# Patient Record
Sex: Female | Born: 1950
Health system: Southern US, Community
[De-identification: ages and names within clinical notes are randomized; demographics above are authoritative.]

## PROBLEM LIST (undated history)

## (undated) DIAGNOSIS — R112 Nausea with vomiting, unspecified: Secondary | ICD-10-CM

## (undated) DIAGNOSIS — K219 Gastro-esophageal reflux disease without esophagitis: Secondary | ICD-10-CM

## (undated) DIAGNOSIS — F32A Depression, unspecified: Secondary | ICD-10-CM

## (undated) DIAGNOSIS — M1990 Unspecified osteoarthritis, unspecified site: Secondary | ICD-10-CM

## (undated) DIAGNOSIS — F329 Major depressive disorder, single episode, unspecified: Secondary | ICD-10-CM

## (undated) DIAGNOSIS — Z9889 Other specified postprocedural states: Secondary | ICD-10-CM

## (undated) DIAGNOSIS — F419 Anxiety disorder, unspecified: Secondary | ICD-10-CM

## (undated) HISTORY — DX: Depression, unspecified: F32.A

## (undated) HISTORY — DX: Major depressive disorder, single episode, unspecified: F32.9

## (undated) HISTORY — DX: Gastro-esophageal reflux disease without esophagitis: K21.9

## (undated) HISTORY — PX: BUNIONECTOMY: SHX129

## (undated) HISTORY — DX: Anxiety disorder, unspecified: F41.9

---

## 2003-06-12 ENCOUNTER — Other Ambulatory Visit: Admission: RE | Admit: 2003-06-12 | Discharge: 2003-06-12 | Payer: Self-pay | Admitting: Obstetrics and Gynecology

## 2004-07-06 ENCOUNTER — Other Ambulatory Visit: Admission: RE | Admit: 2004-07-06 | Discharge: 2004-07-06 | Payer: Self-pay | Admitting: Obstetrics and Gynecology

## 2004-09-15 ENCOUNTER — Encounter: Payer: Self-pay | Admitting: Internal Medicine

## 2004-10-03 ENCOUNTER — Encounter: Payer: Self-pay | Admitting: Internal Medicine

## 2004-11-15 ENCOUNTER — Ambulatory Visit: Payer: Self-pay | Admitting: Internal Medicine

## 2005-01-02 ENCOUNTER — Ambulatory Visit: Payer: Self-pay | Admitting: Internal Medicine

## 2005-02-03 ENCOUNTER — Ambulatory Visit: Payer: Self-pay | Admitting: Hospitalist

## 2005-03-21 ENCOUNTER — Ambulatory Visit: Payer: Self-pay | Admitting: Internal Medicine

## 2005-04-04 ENCOUNTER — Ambulatory Visit: Payer: Self-pay | Admitting: Hospitalist

## 2005-05-02 ENCOUNTER — Ambulatory Visit: Payer: Self-pay | Admitting: Hospitalist

## 2005-07-03 ENCOUNTER — Ambulatory Visit: Payer: Self-pay | Admitting: Internal Medicine

## 2005-08-18 ENCOUNTER — Ambulatory Visit: Payer: Self-pay | Admitting: Hospitalist

## 2005-09-12 ENCOUNTER — Emergency Department (HOSPITAL_COMMUNITY): Admission: EM | Admit: 2005-09-12 | Discharge: 2005-09-12 | Payer: Self-pay | Admitting: Emergency Medicine

## 2005-09-14 ENCOUNTER — Ambulatory Visit: Payer: Self-pay | Admitting: Internal Medicine

## 2005-10-06 ENCOUNTER — Ambulatory Visit: Payer: Self-pay | Admitting: Internal Medicine

## 2006-02-09 DIAGNOSIS — F322 Major depressive disorder, single episode, severe without psychotic features: Secondary | ICD-10-CM

## 2006-02-09 DIAGNOSIS — F411 Generalized anxiety disorder: Secondary | ICD-10-CM | POA: Insufficient documentation

## 2006-02-12 ENCOUNTER — Ambulatory Visit: Payer: Self-pay | Admitting: Internal Medicine

## 2006-02-12 DIAGNOSIS — J309 Allergic rhinitis, unspecified: Secondary | ICD-10-CM | POA: Insufficient documentation

## 2006-02-12 DIAGNOSIS — D239 Other benign neoplasm of skin, unspecified: Secondary | ICD-10-CM | POA: Insufficient documentation

## 2006-03-09 ENCOUNTER — Ambulatory Visit: Payer: Self-pay | Admitting: Hospitalist

## 2006-03-09 LAB — CONVERTED CEMR LAB: Inflenza A Ag: POSITIVE — AB

## 2006-03-28 ENCOUNTER — Encounter (INDEPENDENT_AMBULATORY_CARE_PROVIDER_SITE_OTHER): Payer: Self-pay | Admitting: Internal Medicine

## 2006-03-28 ENCOUNTER — Ambulatory Visit: Payer: Self-pay | Admitting: Internal Medicine

## 2006-03-28 DIAGNOSIS — R51 Headache: Secondary | ICD-10-CM

## 2006-03-28 DIAGNOSIS — G4700 Insomnia, unspecified: Secondary | ICD-10-CM

## 2006-04-11 ENCOUNTER — Encounter (INDEPENDENT_AMBULATORY_CARE_PROVIDER_SITE_OTHER): Payer: Self-pay | Admitting: Internal Medicine

## 2006-04-11 ENCOUNTER — Ambulatory Visit: Payer: Self-pay | Admitting: Internal Medicine

## 2006-04-12 LAB — CONVERTED CEMR LAB
AST: 22 units/L (ref 0–37)
Albumin: 5 g/dL (ref 3.5–5.2)
BUN: 15 mg/dL (ref 6–23)
Calcium: 11 mg/dL — ABNORMAL HIGH (ref 8.4–10.5)
Chloride: 99 meq/L (ref 96–112)
HDL: 55 mg/dL (ref >39)
Potassium: 4.5 meq/L (ref 3.5–5.3)
Sodium: 136 meq/L (ref 135–145)
Total Protein: 8.2 g/dL (ref 6.0–8.3)

## 2006-04-30 ENCOUNTER — Ambulatory Visit (HOSPITAL_COMMUNITY): Admission: RE | Admit: 2006-04-30 | Discharge: 2006-04-30 | Payer: Self-pay | Admitting: Obstetrics and Gynecology

## 2006-04-30 ENCOUNTER — Encounter: Payer: Self-pay | Admitting: Internal Medicine

## 2006-05-01 ENCOUNTER — Ambulatory Visit: Payer: Self-pay | Admitting: Internal Medicine

## 2006-05-03 ENCOUNTER — Ambulatory Visit (HOSPITAL_COMMUNITY): Admission: RE | Admit: 2006-05-03 | Discharge: 2006-05-03 | Payer: Self-pay | Admitting: Obstetrics and Gynecology

## 2006-05-04 ENCOUNTER — Telehealth: Payer: Self-pay | Admitting: *Deleted

## 2006-05-14 ENCOUNTER — Ambulatory Visit: Payer: Self-pay | Admitting: Internal Medicine

## 2006-05-17 ENCOUNTER — Telehealth (INDEPENDENT_AMBULATORY_CARE_PROVIDER_SITE_OTHER): Payer: Self-pay | Admitting: *Deleted

## 2006-05-24 ENCOUNTER — Encounter: Payer: Self-pay | Admitting: Internal Medicine

## 2006-05-24 ENCOUNTER — Ambulatory Visit: Payer: Self-pay | Admitting: Internal Medicine

## 2006-06-11 ENCOUNTER — Telehealth (INDEPENDENT_AMBULATORY_CARE_PROVIDER_SITE_OTHER): Payer: Self-pay | Admitting: *Deleted

## 2006-06-21 ENCOUNTER — Telehealth (INDEPENDENT_AMBULATORY_CARE_PROVIDER_SITE_OTHER): Payer: Self-pay | Admitting: Internal Medicine

## 2006-06-22 ENCOUNTER — Encounter: Payer: Self-pay | Admitting: Internal Medicine

## 2006-06-22 ENCOUNTER — Ambulatory Visit: Payer: Self-pay | Admitting: Internal Medicine

## 2006-07-24 ENCOUNTER — Ambulatory Visit: Payer: Self-pay | Admitting: Internal Medicine

## 2006-08-15 ENCOUNTER — Telehealth (INDEPENDENT_AMBULATORY_CARE_PROVIDER_SITE_OTHER): Payer: Self-pay | Admitting: Pharmacy Technician

## 2006-08-16 ENCOUNTER — Telehealth: Payer: Self-pay | Admitting: *Deleted

## 2006-08-22 ENCOUNTER — Ambulatory Visit: Payer: Self-pay | Admitting: Internal Medicine

## 2006-08-22 DIAGNOSIS — N951 Menopausal and female climacteric states: Secondary | ICD-10-CM

## 2006-09-05 ENCOUNTER — Telehealth: Payer: Self-pay | Admitting: Infectious Disease

## 2006-09-12 ENCOUNTER — Emergency Department (HOSPITAL_COMMUNITY): Admission: EM | Admit: 2006-09-12 | Discharge: 2006-09-13 | Payer: Self-pay | Admitting: Emergency Medicine

## 2006-10-04 ENCOUNTER — Ambulatory Visit: Payer: Self-pay | Admitting: Internal Medicine

## 2006-10-09 ENCOUNTER — Telehealth (INDEPENDENT_AMBULATORY_CARE_PROVIDER_SITE_OTHER): Payer: Self-pay | Admitting: *Deleted

## 2006-11-07 ENCOUNTER — Ambulatory Visit: Payer: Self-pay | Admitting: Internal Medicine

## 2006-11-07 ENCOUNTER — Ambulatory Visit (HOSPITAL_COMMUNITY): Admission: RE | Admit: 2006-11-07 | Discharge: 2006-11-07 | Payer: Self-pay | Admitting: Internal Medicine

## 2006-11-07 DIAGNOSIS — M25569 Pain in unspecified knee: Secondary | ICD-10-CM | POA: Insufficient documentation

## 2006-11-13 ENCOUNTER — Telehealth: Payer: Self-pay | Admitting: *Deleted

## 2006-11-15 ENCOUNTER — Telehealth: Payer: Self-pay | Admitting: Internal Medicine

## 2006-12-17 ENCOUNTER — Ambulatory Visit: Payer: Self-pay | Admitting: Hospitalist

## 2007-01-15 ENCOUNTER — Telehealth: Payer: Self-pay | Admitting: Internal Medicine

## 2007-02-26 ENCOUNTER — Telehealth: Payer: Self-pay | Admitting: *Deleted

## 2007-03-19 ENCOUNTER — Telehealth: Payer: Self-pay | Admitting: Internal Medicine

## 2007-06-10 ENCOUNTER — Ambulatory Visit: Payer: Self-pay | Admitting: *Deleted

## 2007-06-24 ENCOUNTER — Telehealth: Payer: Self-pay | Admitting: Internal Medicine

## 2007-06-25 ENCOUNTER — Telehealth: Payer: Self-pay | Admitting: Internal Medicine

## 2007-07-09 ENCOUNTER — Telehealth: Payer: Self-pay | Admitting: *Deleted

## 2007-08-20 ENCOUNTER — Telehealth: Payer: Self-pay | Admitting: Internal Medicine

## 2007-10-23 ENCOUNTER — Ambulatory Visit: Payer: Self-pay | Admitting: Internal Medicine

## 2007-10-23 ENCOUNTER — Encounter: Payer: Self-pay | Admitting: Internal Medicine

## 2007-10-23 DIAGNOSIS — I10 Essential (primary) hypertension: Secondary | ICD-10-CM | POA: Insufficient documentation

## 2007-11-07 ENCOUNTER — Telehealth: Payer: Self-pay | Admitting: *Deleted

## 2008-01-09 ENCOUNTER — Ambulatory Visit: Payer: Self-pay | Admitting: Internal Medicine

## 2008-02-14 ENCOUNTER — Telehealth: Payer: Self-pay | Admitting: Internal Medicine

## 2008-02-20 ENCOUNTER — Ambulatory Visit: Payer: Self-pay | Admitting: Internal Medicine

## 2008-02-20 ENCOUNTER — Encounter: Payer: Self-pay | Admitting: Internal Medicine

## 2008-02-20 DIAGNOSIS — E785 Hyperlipidemia, unspecified: Secondary | ICD-10-CM | POA: Insufficient documentation

## 2008-02-20 DIAGNOSIS — R413 Other amnesia: Secondary | ICD-10-CM

## 2008-02-25 LAB — CONVERTED CEMR LAB
BUN: 20 mg/dL (ref 6–23)
CO2: 23 meq/L (ref 19–32)
Calcium: 9.2 mg/dL (ref 8.4–10.5)
Chloride: 102 meq/L (ref 96–112)
Cholesterol: 226 mg/dL — ABNORMAL HIGH (ref 0–200)
Creatinine, Ser: 0.63 mg/dL (ref 0.40–1.20)
Eosinophils Relative: 2 % (ref 0–5)
Glucose, Bld: 114 mg/dL — ABNORMAL HIGH (ref 70–99)
HCT: 40.2 % (ref 36.0–46.0)
Hemoglobin: 12.1 g/dL (ref 12.0–15.0)
Lymphocytes Relative: 48 % — ABNORMAL HIGH (ref 12–46)
Lymphs Abs: 3.1 10*3/uL (ref 0.7–4.0)
Monocytes Absolute: 0.5 10*3/uL (ref 0.1–1.0)
Monocytes Relative: 8 % (ref 3–12)
Neutro Abs: 2.6 10*3/uL (ref 1.7–7.7)
RBC: 4.24 M/uL (ref 3.87–5.11)
RDW: 13.2 % (ref 11.5–15.5)
Total Bilirubin: 0.2 mg/dL — ABNORMAL LOW (ref 0.3–1.2)
Total CHOL/HDL Ratio: 5.4
Triglycerides: 607 mg/dL — ABNORMAL HIGH (ref <150)
Vitamin B-12: 588 pg/mL (ref 211–911)
WBC: 6.4 10*3/uL (ref 4.0–10.5)

## 2008-03-17 ENCOUNTER — Telehealth: Payer: Self-pay | Admitting: *Deleted

## 2008-05-22 ENCOUNTER — Telehealth: Payer: Self-pay | Admitting: Infectious Disease

## 2008-06-01 ENCOUNTER — Encounter (INDEPENDENT_AMBULATORY_CARE_PROVIDER_SITE_OTHER): Payer: Self-pay | Admitting: *Deleted

## 2008-06-01 ENCOUNTER — Ambulatory Visit: Payer: Self-pay | Admitting: *Deleted

## 2008-06-01 DIAGNOSIS — N390 Urinary tract infection, site not specified: Secondary | ICD-10-CM | POA: Insufficient documentation

## 2008-06-02 ENCOUNTER — Telehealth: Payer: Self-pay | Admitting: *Deleted

## 2008-06-05 DIAGNOSIS — R748 Abnormal levels of other serum enzymes: Secondary | ICD-10-CM | POA: Insufficient documentation

## 2008-06-05 LAB — CONVERTED CEMR LAB
ALT: 42 units/L — ABNORMAL HIGH (ref 0–35)
CO2: 24 meq/L (ref 19–32)
Calcium: 9.9 mg/dL (ref 8.4–10.5)
Chloride: 103 meq/L (ref 96–112)
Creatinine, Ser: 0.75 mg/dL (ref 0.40–1.20)
GFR calc Af Amer: >60 mL/min (ref >60)
GFR calc non Af Amer: >60 mL/min (ref >60)
Glucose, Bld: 106 mg/dL — ABNORMAL HIGH (ref 70–99)
HDL: 49 mg/dL (ref >39)
LDL Cholesterol: 125 mg/dL — ABNORMAL HIGH (ref 0–99)
Nitrite: POSITIVE — AB
Specific Gravity, Urine: 1.031 — ABNORMAL HIGH (ref 1.005–1.030)
Total Bilirubin: 0.4 mg/dL (ref 0.3–1.2)
Total Protein: 7.6 g/dL (ref 6.0–8.3)
Urine Glucose: NEGATIVE mg/dL
pH: 6 (ref 5.0–8.0)

## 2008-06-14 ENCOUNTER — Telehealth (INDEPENDENT_AMBULATORY_CARE_PROVIDER_SITE_OTHER): Payer: Self-pay | Admitting: Internal Medicine

## 2008-07-02 ENCOUNTER — Telehealth: Payer: Self-pay | Admitting: Internal Medicine

## 2008-07-08 ENCOUNTER — Ambulatory Visit: Payer: Self-pay | Admitting: Internal Medicine

## 2008-09-17 ENCOUNTER — Ambulatory Visit: Payer: Self-pay | Admitting: Internal Medicine

## 2008-09-25 ENCOUNTER — Telehealth (INDEPENDENT_AMBULATORY_CARE_PROVIDER_SITE_OTHER): Payer: Self-pay | Admitting: *Deleted

## 2008-11-03 ENCOUNTER — Ambulatory Visit: Payer: Self-pay | Admitting: Internal Medicine

## 2009-02-04 ENCOUNTER — Ambulatory Visit: Payer: Self-pay | Admitting: Internal Medicine

## 2009-02-08 ENCOUNTER — Telehealth: Payer: Self-pay | Admitting: Internal Medicine

## 2009-03-18 ENCOUNTER — Ambulatory Visit: Payer: Self-pay | Admitting: Internal Medicine

## 2009-03-18 LAB — CONVERTED CEMR LAB
ALT: 30 units/L (ref 0–35)
AST: 25 units/L (ref 0–37)
Albumin: 4.7 g/dL (ref 3.5–5.2)
Alkaline Phosphatase: 115 units/L (ref 39–117)
BUN: 16 mg/dL (ref 6–23)
Basophils Absolute: 0.1 10*3/uL (ref 0.0–0.1)
Eosinophils Relative: 2 % (ref 0–5)
HDL: 44 mg/dL (ref >39)
LDL Cholesterol: 89 mg/dL (ref 0–99)
Lymphocytes Relative: 37 % (ref 12–46)
Lymphs Abs: 3 10*3/uL (ref 0.7–4.0)
Neutro Abs: 4.1 10*3/uL (ref 1.7–7.7)
Neutrophils Relative %: 51 % (ref 43–77)
Platelets: 352 10*3/uL (ref 150–400)
Potassium: 4.3 meq/L (ref 3.5–5.3)
RDW: 14.7 % (ref 11.5–15.5)
Sodium: 139 meq/L (ref 135–145)
TSH: 2.311 microintl units/mL (ref 0.350–4.5)
Vit D, 25-Hydroxy: 34 ng/mL (ref 30–89)
WBC: 8 10*3/uL (ref 4.0–10.5)

## 2009-06-01 ENCOUNTER — Telehealth: Payer: Self-pay | Admitting: Internal Medicine

## 2009-06-02 ENCOUNTER — Ambulatory Visit: Payer: Self-pay | Admitting: Internal Medicine

## 2009-06-02 ENCOUNTER — Ambulatory Visit (HOSPITAL_COMMUNITY)
Admission: RE | Admit: 2009-06-02 | Discharge: 2009-06-02 | Payer: Self-pay | Source: Home / Self Care | Admitting: Internal Medicine

## 2009-06-02 DIAGNOSIS — M25559 Pain in unspecified hip: Secondary | ICD-10-CM | POA: Insufficient documentation

## 2009-06-17 ENCOUNTER — Ambulatory Visit: Payer: Self-pay | Admitting: Internal Medicine

## 2009-06-17 DIAGNOSIS — M545 Low back pain, unspecified: Secondary | ICD-10-CM | POA: Insufficient documentation

## 2009-07-12 ENCOUNTER — Telehealth: Payer: Self-pay | Admitting: Internal Medicine

## 2009-08-19 ENCOUNTER — Ambulatory Visit: Payer: Self-pay | Admitting: Internal Medicine

## 2009-09-06 ENCOUNTER — Telehealth: Payer: Self-pay | Admitting: Internal Medicine

## 2009-09-10 ENCOUNTER — Telehealth: Payer: Self-pay | Admitting: Internal Medicine

## 2009-09-23 ENCOUNTER — Telehealth: Payer: Self-pay | Admitting: *Deleted

## 2009-10-05 ENCOUNTER — Ambulatory Visit: Payer: Self-pay | Admitting: Internal Medicine

## 2009-10-05 LAB — CONVERTED CEMR LAB
Glucose, Urine, Semiquant: NEGATIVE
Nitrite: NEGATIVE
Specific Gravity, Urine: 1.01
Urobilinogen, UA: 0.2
pH: 5.5

## 2009-10-21 ENCOUNTER — Ambulatory Visit: Payer: Self-pay | Admitting: Internal Medicine

## 2009-11-11 ENCOUNTER — Telehealth: Payer: Self-pay | Admitting: Licensed Clinical Social Worker

## 2009-12-01 ENCOUNTER — Telehealth: Payer: Self-pay | Admitting: Internal Medicine

## 2009-12-03 ENCOUNTER — Ambulatory Visit: Payer: Self-pay | Admitting: Internal Medicine

## 2009-12-09 ENCOUNTER — Ambulatory Visit: Payer: Self-pay | Admitting: Internal Medicine

## 2009-12-09 ENCOUNTER — Encounter: Payer: Self-pay | Admitting: Internal Medicine

## 2009-12-09 ENCOUNTER — Ambulatory Visit (HOSPITAL_COMMUNITY): Admission: RE | Admit: 2009-12-09 | Discharge: 2009-12-09 | Payer: Self-pay | Admitting: Ophthalmology

## 2009-12-09 LAB — CONVERTED CEMR LAB: Sed Rate: 10 mm/hr (ref 0–22)

## 2009-12-13 ENCOUNTER — Telehealth: Payer: Self-pay | Admitting: Internal Medicine

## 2009-12-14 ENCOUNTER — Ambulatory Visit: Payer: Self-pay | Admitting: Internal Medicine

## 2009-12-27 ENCOUNTER — Telehealth: Payer: Self-pay | Admitting: Internal Medicine

## 2009-12-28 ENCOUNTER — Ambulatory Visit: Payer: Self-pay | Admitting: Internal Medicine

## 2009-12-30 ENCOUNTER — Ambulatory Visit: Payer: Self-pay

## 2010-01-19 ENCOUNTER — Telehealth: Payer: Self-pay | Admitting: Internal Medicine

## 2010-02-13 ENCOUNTER — Encounter: Payer: Self-pay | Admitting: Obstetrics and Gynecology

## 2010-02-13 ENCOUNTER — Encounter: Payer: Self-pay | Admitting: Internal Medicine

## 2010-02-22 NOTE — Progress Notes (Addendum)
Summary: phone/gg  Phone Note Call from Patient   Caller: Patient Summary of Call: Pt called and needs a PA to get  [facility] XL 150 MG  She does not have any pills left. She does not take lexapro anymore.  Willl she be okay not to take these meds for a few days? Initial call taken by: Merrie Roof RN,  September 06, 2009 1:07 PM  Follow-up for Phone Call        its ok to go off the wellbutrin without a taper- i would tell her to continue to take teh Lexapro for now until she can discuss this with her psychiatrist. but all in all there is probably not really going to be a serious problem just stopping any of them. Follow-up by: Julaine Fusi  DO,  September 06, 2009 10:15 PM  Additional Follow-up for Phone Call Additional follow up Details #1::        Pt informed and Patient/caller verbalizes understanding of these instructions.  Additional Follow-up by: Merrie Roof RN,  September 07, 2009 12:07 PM

## 2010-02-22 NOTE — Assessment & Plan Note (Addendum)
Summary: EST-CK/FU/MEDS/CFB   Vital Signs:  Patient profile:   60 year old female Menstrual status:  postmenopausal Height:      60.5 inches (153.67 cm) Weight:      178.02 pounds (80.92 kg) BMI:     34.32 Temp:     97.4 degrees F (36.33 degrees C) oral Pulse rate:   88 / minute BP sitting:   115 / 82  (right arm)  Vitals Entered By: Angelina Ok RN (August 19, 2009 10:29 AM) CC: Depression Is Patient Diabetic? No Pain Assessment Patient in pain? yes     Location: back Intensity: 3 Type: aching Nutritional Status BMI of > 30 = obese  Have you ever been in a relationship where you felt threatened, hurt or afraid?No   Does patient need assistance? Functional Status Self care Ambulation Normal Comments Check up.  Needs refills   Primary Care Provider:  Julaine Fusi  DO  CC:  Depression.  History of Present Illness: [name] comes in today complaining of worseing depression and weight gain. Reduced activities-feels liek she doesnt want to get out of bed. Reports severe lonliness and low self confidence.  Depression History:      The patient is having a depressed mood most of the day and has a diminished interest in her usual daily activities.        The patient denies that she feels like life is not worth living, denies that she wishes that she were dead, and denies that she has thought about ending her life.        Comments:  Couple of weeks did not get out of bed.  .   Preventive Screening-Counseling & Management  Alcohol-Tobacco     Alcohol drinks/day: 1-2 per yr     Alcohol type: WINE      Smoking Status: never  Current Medications (verified): 1)  Alprazolam 1 Mg Tabs (Alprazolam) .... Take 1 Tablet By Mouth Three Times A Day 2)  Vitamin D 60109 Unit Caps (Ergocalciferol) .... Take 1 Tablet By Mouth Once A Week (Monday) 3)  Lexapro 20 Mg Tabs (Escitalopram Oxalate) .... Take 1 Tablet By Mouth Once A Day 4)  Nexium 20 Mg Cpdr (Esomeprazole Magnesium) .... Take 1  Tablet By Mouth Once A Day 5)  Vicodin 5-500 Mg Tabs (Hydrocodone-Acetaminophen) .... Take 1 Tablet By Mouth Every 6 Hour For Pain As Needed. 6)  Celebrex 200 Mg Caps (Celecoxib) .... Take 1 Tablet By Mouth Two Times A Day 7)  Wellbutrin Xl 150 Mg Xr24h-Tab (Bupropion Hcl) .... Take 1 Tablet By Mouth Once A Day  Allergies (verified): 1)  ! Pcn 2)  Codeine  Physical Exam  General:  alert, well-developed, and well-nourished.   Lungs:  normal respiratory effort, no intercostal retractions, no accessory muscle use, and normal breath sounds.   Heart:  normal rate and regular rhythm.   Abdomen:  soft, non-tender, normal bowel sounds, and no distention.   Msk:  normal ROM, no joint tenderness, no joint swelling, no joint warmth, and no redness over joints.   Impression & Recommendations:  Problem # 1:  BACK PAIN, LUMBAR (ICD-724.2) Encouraged her to use Celebrex despite TV ads. Ok with occasional vicodin use but not daily. Avoid long term opiates in this situation. May need to get MRI if pain continues. XR neg.  Her updated medication list for this problem includes:    Vicodin 5-500 Mg Tabs (Hydrocodone-acetaminophen) .Marland Kitchen... Take 1 tablet by mouth every 6 hour for pain as needed.  Celebrex 200 Mg Caps (Celecoxib) .Marland Kitchen... Take 1 tablet by mouth two times a day  Problem # 2:  DEPRESSION (ICD-311) At baseline. Situational. Mood disorder, personality disorder complicate existing depression. She would do well with counseling , but refuses- giving the reason that he knows more than teh counselor and reporst that she has tried it before it doesnt help.    Her updated medication list for this problem includes:    Alprazolam 1 Mg Tabs (Alprazolam) .Marland Kitchen... Take 1 tablet by mouth three times a day    Lexapro 20 Mg Tabs (Escitalopram oxalate) .Marland Kitchen... Take 1 tablet by mouth once a day    Wellbutrin Xl 150 Mg Xr24h-tab (Bupropion hcl) .Marland Kitchen... Take 1 tablet by mouth once a day  Problem # 3:  HYPERTENSION,  BENIGN ESSENTIAL (ICD-401.1) Well controlled. No chenge today.  BP today: 115/82 Prior BP: 126/76 (06/17/2009)  Labs Reviewed: K+: 4.3 (03/18/2009) Creat: : 0.85 (03/18/2009)   Chol: 207 (03/18/2009)   HDL: 44 (03/18/2009)   LDL: 89 (03/18/2009)   TG: 371 (03/18/2009)  Complete Medication List: 1)  Alprazolam 1 Mg Tabs (Alprazolam) .... Take 1 tablet by mouth three times a day 2)  Vitamin D 42595 Unit Caps (Ergocalciferol) .... Take 1 tablet by mouth once a week (monday) 3)  Lexapro 20 Mg Tabs (Escitalopram oxalate) .... Take 1 tablet by mouth once a day 4)  Nexium 20 Mg Cpdr (Esomeprazole magnesium) .... Take 1 tablet by mouth once a day 5)  Vicodin 5-500 Mg Tabs (Hydrocodone-acetaminophen) .... Take 1 tablet by mouth every 6 hour for pain as needed. 6)  Celebrex 200 Mg Caps (Celecoxib) .... Take 1 tablet by mouth two times a day 7)  Wellbutrin Xl 150 Mg Xr24h-tab (Bupropion hcl) .... Take 1 tablet by mouth once a day  Patient Instructions: 1)  Please schedule a follow-up appointment in 2 months. Prescriptions: VICODIN 5-500 MG TABS (HYDROCODONE-ACETAMINOPHEN) Take 1 tablet by mouth every 6 hour for pain as needed.  #60 x 0   Entered and Authorized by:   Julaine Fusi  DO   Signed by:   Julaine Fusi  DO on 08/19/2009   Method used:   Print then Give to Patient   RxID:   [id number] WELLBUTRIN XL 150 MG XR24H-TAB (BUPROPION HCL) Take 1 tablet by mouth once a day  #30 x 3   Entered and Authorized by:   Julaine Fusi  DO   Signed by:   Julaine Fusi  DO on 08/19/2009   Method used:   Electronically to        [facility]  [phone]* (retail)       [address]       [place], [place]  [address]       Ph: [phone] or [phone]       Fax: [phone]   RxID:   [id number]   Prevention & Chronic Care Immunizations   Influenza vaccine: Fluvax MCR  (11/03/2008)    Tetanus booster: Not documented    Pneumococcal vaccine: Not  documented    H. zoster vaccine: Not documented  Colorectal Screening   Hemoccult: Not documented    Colonoscopy: Results: Normal. Results: Diverticulosis.       Location:  [facility].  Location:  [facility] .    (12/15/2006)  Other Screening   Pap smear: Not documented    Mammogram: No specific mammographic evidence of malignancy.    (04/30/2006)  Mammogram action/deferral: Ordered  (03/18/2009)    DXA bone density scan: Not documented   Smoking status: never  (08/19/2009)  Lipids   Total Cholesterol: 207  (03/18/2009)   LDL: 89  (03/18/2009)   LDL Direct: Not documented   HDL: 44  (03/18/2009)   Triglycerides: 371  (03/18/2009)    SGOT (AST): 25  (03/18/2009)   SGPT (ALT): 30  (03/18/2009)   Alkaline phosphatase: 115  (03/18/2009)   Total bilirubin: 0.3  (03/18/2009)  Hypertension   Last Blood Pressure: 115 / 82  (08/19/2009)   Serum creatinine: 0.85  (03/18/2009)   Serum potassium 4.3  (03/18/2009)  Self-Management Support :   Personal Goals (by the next clinic visit) :      Personal blood pressure goal: 140/90  (06/02/2009)     Personal LDL goal: 100  (06/02/2009)    Patient will work on the following items until the next clinic visit to reach self-care goals:     Medications and monitoring: take my medicines every day, bring all of my medications to every visit  (08/19/2009)     Eating: drink diet soda or water instead of juice or soda, eat more vegetables, use fresh or frozen vegetables, eat foods that are low in salt, eat baked foods instead of fried foods, eat fruit for snacks and desserts, limit or avoid alcohol  (08/19/2009)     Activity: take a 30 minute walk every day  (08/19/2009)    Hypertension self-management support: Written self-care plan, Education handout, Pre-printed educational material, Resources for patients handout  (08/19/2009)   Hypertension self-care plan printed.   Hypertension education handout  printed    Lipid self-management support: Written self-care plan, Education handout, Pre-printed educational material, Resources for patients handout  (08/19/2009)   Lipid self-care plan printed.   Lipid education handout printed      Resource handout printed.     Vital Signs:  Patient profile:   60 year old female Menstrual status:  postmenopausal Height:      60.5 inches (153.67 cm) Weight:      178.02 pounds (80.92 kg) BMI:     34.32 Temp:     97.4 degrees F (36.33 degrees C) oral Pulse rate:   88 / minute BP sitting:   115 / 82  (right arm)  Vitals Entered By: Angelina Ok RN (August 19, 2009 10:29 AM)

## 2010-02-22 NOTE — Progress Notes (Addendum)
Summary: Dental Referral  Phone Note Call from Patient   Caller: Patient Call For: Julaine Fusi  DO Summary of Call: Call from pt wants a referral to the Dentist.  [place] Spaniel she had spoken to the doctor about.   Pt says that she just has not seen a Dentist in a while Angelina Ok RN  February 08, 2009 11:03 AM  Initial call taken by: Angelina Ok RN,  February 08, 2009 11:03 AM  Follow-up for Phone Call        I will put in referral Follow-up by: Julaine Fusi  DO,  February 08, 2009 3:13 PM  Additional Follow-up for Phone Call Additional follow up Details #1::        Since pt has no Dental problems  will nedd to go to the next [facility] wide [facility].  Pt was informed of.  Additional Follow-up by: Angelina Ok RN,  April 13, 2009 11:43 AM

## 2010-02-22 NOTE — Progress Notes (Addendum)
Summary: phone/gg  Phone Note Call from Patient   Caller: Patient Summary of Call: Pt called to say she can't even walk, she is in such pain.  She needs to know what else to do .  She is using heating pad without help.  She would like you to call her with the results of her xrays.  She does not know what to do. Pt seen 11/11 by Dr Cathey Endow for same pain. Pt # [id number] Initial call taken by: Merrie Roof RN,  December 13, 2009 11:28 AM  Follow-up for Phone Call        Pt can not come in today.   talked with Dr Cathey Endow and pt will be seen tomorrow for evaluation. xray results not in chart.  Under wrong MRN - will fix. Pt informed Follow-up by: Merrie Roof RN,  December 13, 2009 11:57 AM  Additional Follow-up for Phone Call Additional follow up Details #1::        I had sent flag to [facility] to fix the MRN issue. THanks Additional Follow-up by: Blanch Media MD,  December 13, 2009 1:43 PM

## 2010-02-22 NOTE — Assessment & Plan Note (Addendum)
Summary: F/U/VS   Vital Signs:  Patient profile:   60 year old female Height:      60.5 inches (153.67 cm) Weight:      183.4 pounds (83.36 kg) BMI:     35.36 Temp:     97.4 degrees F (36.33 degrees C) oral Pulse rate:   84 / minute BP sitting:   113 / 69  (right arm)  Vitals Entered By: Blenda Mounts (March 18, 2009 9:20 AM)[name], RN March 18, 2009 9:20 AM CC: check up, fell in [place] an still has left leg pain, Depression Is Patient Diabetic? No Pain Assessment Patient in pain? yes     Location: left leg Intensity: 5 or 6 Type: aching Onset of pain  Intermittent, worse at night Nutritional Status BMI of > 30 = obese  Have you ever been in a relationship where you felt threatened, hurt or afraid?No   Does patient need assistance? Functional Status Self care Ambulation Normal   Primary Care Provider:  Julaine Fusi  DO  CC:  check up, fell in jan an still has left leg pain, and Depression.  History of Present Illness: [name] comes in for routine follow-up. She continues to be fatigued and depressed most days. 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 She continues to struggle with her weight.  Depression History:      The patient is having a depressed mood most of the day and has a diminished interest in her usual daily activities.        The patient denies that she feels like life is not worth living, denies that she wishes that she were dead, and denies that she has thought about ending her life.         Preventive Screening-Counseling & Management  Alcohol-Tobacco     Alcohol drinks/day: 1-2 per yr     Alcohol type: WINE      Smoking Status: never  Caffeine-Diet-Exercise     Does Patient Exercise: yes     Type of exercise: WALK     Times/week: 3  Current Medications (verified): 1)  Alprazolam 1 Mg Tabs (Alprazolam) .... Take 1 Tablet By Mouth Three Times A Day 2)  Vitamin D 40981 Unit Caps (Ergocalciferol) ....  Take 1 Tablet By Mouth Once A Week (Monday) 3)  Lexapro 10 Mg Tabs (Escitalopram Oxalate) .... Take 1 Tablet By Mouth Once A Day 4)  Crestor 10 Mg Tabs (Rosuvastatin Calcium) .... Take 1 Tablet By Mouth Once A Day 5)  Nexium 20 Mg Cpdr (Esomeprazole Magnesium) .... Take 1 Tablet By Mouth Once A Day 6)  Topamax 100 Mg Tabs (Topiramate) .... Take 1 Tablet By Mouth Once A Day 7)  Glucophage Xr 500 Mg Xr24h-Tab (Metformin Hcl) .... Take 1 Tablet By Mouth Once A Day 8)  Tramadol Hcl 50 Mg Tabs (Tramadol Hcl) .... Take 1 Tablet By Mouth Every 6 Hours  Allergies (verified): 1)  ! Pcn 2)  Codeine  Physical Exam  General:  Middle-aged overweight woman in NAD. Marland Kitchenalert, well-developed, well-nourished, and well-hydrated.   Lungs:  normal respiratory effort, no intercostal retractions, no accessory muscle use, normal breath sounds, no crackles, and no wheezes.   Heart:  normal rate, regular rhythm, and no murmur.   Abdomen:  soft, non-tender, normal bowel sounds, no distention, no masses, no guarding, and no rigidity.   Msk:  no joint swelling and no redness over joints.   Extremities:  Some tenderness in left  thigh from a fall back in Jan. Psych:  Oriented X3, memory intact for recent and remote, normally interactive, good eye contact, and not anxious appearing.     Impression & Recommendations:  Problem # 1:  DEPRESSION (ICD-311) I have encouraged her to speak with psych in detail about her issues Her updated medication list for this problem includes:    Alprazolam 1 Mg Tabs (Alprazolam) .Marland Kitchen... Take 1 tablet by mouth three times a day    Lexapro 10 Mg Tabs (Escitalopram oxalate) .Marland Kitchen... Take 1 tablet by mouth once a day  Problem # 2:  ANXIETY (ICD-300.00) Continue current meds. Her updated medication list for this problem includes:    Alprazolam 1 Mg Tabs (Alprazolam) .Marland Kitchen... Take 1 tablet by mouth three times a day    Lexapro 10 Mg Tabs (Escitalopram oxalate) .Marland Kitchen... Take 1 tablet by mouth once a  day  Problem # 3:  ANXIETY (ICD-300.00)  Her updated medication list for this problem includes:    Alprazolam 1 Mg Tabs (Alprazolam) .Marland Kitchen... Take 1 tablet by mouth three times a day    Lexapro 10 Mg Tabs (Escitalopram oxalate) .Marland Kitchen... Take 1 tablet by mouth once a day  Problem # 4:  KNEE PAIN, BILATERAL (ICD-719.46)  Her updated medication list for this problem includes:    Tramadol Hcl 50 Mg Tabs (Tramadol hcl) .Marland Kitchen... Take 1 tablet by mouth every 6 hours  Complete Medication List: 1)  Alprazolam 1 Mg Tabs (Alprazolam) .... Take 1 tablet by mouth three times a day 2)  Vitamin D 66440 Unit Caps (Ergocalciferol) .... Take 1 tablet by mouth once a week (monday) 3)  Lexapro 10 Mg Tabs (Escitalopram oxalate) .... Take 1 tablet by mouth once a day 4)  Crestor 10 Mg Tabs (Rosuvastatin calcium) .... Take 1 tablet by mouth once a day 5)  Nexium 20 Mg Cpdr (Esomeprazole magnesium) .... Take 1 tablet by mouth once a day 6)  Topamax 100 Mg Tabs (Topiramate) .... Take 1 tablet by mouth once a day 7)  Glucophage Xr 500 Mg Xr24h-tab (Metformin hcl) .... Take 1 tablet by mouth once a day 8)  Tramadol Hcl 50 Mg Tabs (Tramadol hcl) .... Take 1 tablet by mouth every 6 hours  Other Orders: Mammogram (Screening) (Mammo) T-Comprehensive Metabolic Panel [id number]) T-Lipid Profile [id number]) T-CBC w/Diff [id number]) T-TSH [id number]) T-Vitamin D (25-Hydroxy) [id number])  Patient Instructions: 1)  Please schedule a follow-up appointment in 3 months. Prescriptions: LEXAPRO 10 MG TABS (ESCITALOPRAM OXALATE) Take 1 tablet by mouth once a day  #30 x 11   Entered and Authorized by:   Julaine Fusi  DO   Signed by:   Julaine Fusi  DO on 04/12/2009   Method used:   Print then Give to Patient   RxID:   [id number] TRAMADOL HCL 50 MG TABS (TRAMADOL HCL) Take 1 tablet by mouth every 6 hours  #60 x 0   Entered and Authorized by:   Julaine Fusi  DO   Signed by:   Julaine Fusi  DO on 03/18/2009    Method used:   Print then Give to Patient   RxID:   [id number]  Process Orders Check Orders Results:     [facility]: Check successful Tests Sent for requisitioning (April 12, 2009 12:01 PM):     03/18/2009: [facility] -- T-Comprehensive Metabolic Panel [[id number]] (signed)     03/18/2009: [facility] -- T-Lipid Profile [id number] (signed)     03/18/2009: [facility] -- [employer]  w/Diff [[id number]] (signed)     03/18/2009: [facility] -- T-TSH [phone] (signed)     03/18/2009: [facility] -- T-Vitamin D (25-Hydroxy) [phone] (signed)    Prevention & Chronic Care Immunizations   Influenza vaccine: Fluvax MCR  (11/03/2008)    Tetanus booster: Not documented    Pneumococcal vaccine: Not documented    H. zoster vaccine: Not documented  Colorectal Screening   Hemoccult: Not documented    Colonoscopy: Results: Normal. Results: Diverticulosis.       Location:  [facility].  Location:  [facility] .    (12/15/2006)  Other Screening   Pap smear: Not documented    Mammogram: Not documented   Mammogram action/deferral: Ordered  (03/18/2009)    DXA bone density scan: Not documented   Smoking status: never  (03/18/2009)  Lipids   Total Cholesterol: 211  (06/01/2008)   LDL: 125  (06/01/2008)   LDL Direct: Not documented   HDL: 49  (06/01/2008)   Triglycerides: 183  (06/01/2008)    SGOT (AST): 28  (06/01/2008)   SGPT (ALT): 42  (06/01/2008) CMP ordered    Alkaline phosphatase: 146  (06/01/2008)   Total bilirubin: 0.4  (06/01/2008)  Hypertension   Last Blood Pressure: 113 / 69  (03/18/2009)   Serum creatinine: 0.75  (06/01/2008)   Serum potassium 3.9  (06/01/2008) CMP ordered   Self-Management Support :    Hypertension self-management support: Not documented    Lipid self-management support: Not documented    Nursing  Instructions: Schedule screening mammogram (see order)    Process Orders Check Orders Results:     [facility]: Check successful Tests Sent for requisitioning (April 12, 2009 12:01 PM):     03/18/2009: [facility] -- T-Comprehensive Metabolic Panel [[id number]] (signed)     03/18/2009: [facility] -- T-Lipid Profile [id number] (signed)     03/18/2009: [facility] -- T-CBC w/Diff [[id number]] (signed)     03/18/2009: [facility] -- T-TSH [phone] (signed)     03/18/2009: [facility] -- T-Vitamin D (25-Hydroxy) [id number] (signed)

## 2010-02-22 NOTE — Assessment & Plan Note (Addendum)
Summary: ACUTE/GOLDING/UTI/CH   Vital Signs:  Patient profile:   60 year old female Menstrual status:  postmenopausal Height:      60.5 inches (153.67 cm) Weight:      178 pounds (80.91 kg) BMI:     34.31 Temp:     98.6 degrees F (37.00 degrees C) oral Pulse rate:   74 / minute BP sitting:   133 / 63  (right arm)  Vitals Entered By: Angelina Ok RN (October 05, 2009 3:42 PM)  Have you ever been in a relationship where you felt threatened, hurt or afraid?No  Comments dysurea   Primary Care Provider:  Julaine Fusi  DO   History of Present Illness: 60 y/o woman comes to the clinic complainig of dysurea. no fever, no abd pain, no vaginal discharge, no other complaints. has been going on for few days. took cefalosporin prescribed to her dog by a vet. she is unsure how many tablets she took.   Depression History:      The patient denies a depressed mood most of the day and a diminished interest in her usual daily activities.         Preventive Screening-Counseling & Management  Alcohol-Tobacco     Alcohol drinks/day: 1-2 per yr     Alcohol type: WINE      Smoking Status: never  Current Medications (verified): 1)  Alprazolam 1 Mg Tabs (Alprazolam) .... Take 1 Tablet By Mouth Three Times A Day 2)  Vitamin D 04540 Unit Caps (Ergocalciferol) .... Take 1 Tablet By Mouth Once A Week (Monday) 3)  Lexapro 20 Mg Tabs (Escitalopram Oxalate) .... Take 1 Tablet By Mouth Once A Day 4)  Nexium 20 Mg Cpdr (Esomeprazole Magnesium) .... Take 1 Tablet By Mouth Once A Day 5)  Vicodin 5-500 Mg Tabs (Hydrocodone-Acetaminophen) .... Take 1 Tablet By Mouth Every 6 Hour For Pain As Needed. 6)  Celebrex 200 Mg Caps (Celecoxib) .... Take 1 Tablet By Mouth Two Times A Day 7)  Wellbutrin Xl 150 Mg Xr24h-Tab (Bupropion Hcl) .... Take 1 Tablet By Mouth Once A Day  Allergies: 1)  ! Pcn 2)  ! Cephalexin 3)  Codeine  Review of Systems  The patient denies anorexia, fever, weight loss, weight gain,  vision loss, decreased hearing, hoarseness, chest pain, syncope, dyspnea on exertion, peripheral edema, prolonged cough, headaches, hemoptysis, abdominal pain, melena, hematochezia, severe indigestion/heartburn, hematuria, incontinence, genital sores, muscle weakness, suspicious skin lesions, transient blindness, difficulty walking, depression, unusual weight change, abnormal bleeding, enlarged lymph nodes, angioedema, breast masses, and testicular masses.    Physical Exam  General:  alert, well-developed, and well-nourished.   Head:  normocephalic and atraumatic.   Eyes:  vision grossly intact, pupils equal, and pupils round.   Ears:  R ear normal and L ear normal.   Nose:  no external deformity.   Mouth:  good dentition.   Neck:  supple and full ROM.   Lungs:  normal respiratory effort, no intercostal retractions, no accessory muscle use, normal breath sounds, and no dullness.   Heart:  normal rate, regular rhythm, no murmur, no gallop, and no rub.   Abdomen:  soft, non-tender, normal bowel sounds, no distention, and no masses.  no cva tenderness   Impression & Recommendations:  Problem # 1:  UTI (ICD-599.0) possible uncomplicated urinary infection. the dipstick shows mild leucocytosis. she took few tablets of the abx prescribed to her dog. will await urine Cx. will prescribe ABx if infection detected. wil advise her to  RTC in case symptoms persists.   Orders: T-Urinalysis Dipstick only ([id number]) T-Culture, Urine ([id number])  Complete Medication List: 1)  Alprazolam 1 Mg Tabs (Alprazolam) .... Take 1 tablet by mouth three times a day 2)  Vitamin D 47829 Unit Caps (Ergocalciferol) .... Take 1 tablet by mouth once a week (monday) 3)  Lexapro 20 Mg Tabs (Escitalopram oxalate) .... Take 1 tablet by mouth once a day 4)  Nexium 20 Mg Cpdr (Esomeprazole magnesium) .... Take 1 tablet by mouth once a day 5)  Vicodin 5-500 Mg Tabs (Hydrocodone-acetaminophen) .... Take 1 tablet by mouth every  6 hour for pain as needed. 6)  Celebrex 200 Mg Caps (Celecoxib) .... Take 1 tablet by mouth two times a day 7)  Wellbutrin Xl 150 Mg Xr24h-tab (Bupropion hcl) .... Take 1 tablet by mouth once a day  Other Orders: Influenza Vaccine MCR ([id number])  Patient Instructions: 1)  Will call you with results from the urine analysis, call us if you are running a fever, abominal pain or other complain   Vital Signs:  Patient profile:   60 year old female Menstrual status:  postmenopausal Height:      60.5 inches (153.67 cm) Weight:      178 pounds (80.91 kg) BMI:     34.31 Temp:     98.6 degrees F (37.00 degrees C) oral Pulse rate:   74 / minute BP sitting:   133 / 63  (right arm)  Vitals Entered By: Angelina Ok RN (October 05, 2009 3:42 PM)   Prevention & Chronic Care Immunizations   Influenza vaccine: Fluvax MCR  (10/05/2009)    Tetanus booster: Not documented    Pneumococcal vaccine: Not documented    H. zoster vaccine: Not documented  Colorectal Screening   Hemoccult: Not documented    Colonoscopy: Results: Normal. Results: Diverticulosis.       Location:  [facility].  Location:  [facility] .    (12/15/2006)  Other Screening   Pap smear: Not documented    Mammogram: No specific mammographic evidence of malignancy.    (04/30/2006)   Mammogram action/deferral: Ordered  (03/18/2009)    DXA bone density scan: Not documented   Smoking status: never  (10/05/2009)  Lipids   Total Cholesterol: 207  (03/18/2009)   LDL: 89  (03/18/2009)   LDL Direct: Not documented   HDL: 44  (03/18/2009)   Triglycerides: 371  (03/18/2009)    SGOT (AST): 25  (03/18/2009)   SGPT (ALT): 30  (03/18/2009)   Alkaline phosphatase: 115  (03/18/2009)   Total bilirubin: 0.3  (03/18/2009)  Hypertension   Last Blood Pressure: 133 / 63  (10/05/2009)   Serum creatinine: 0.85  (03/18/2009)   Serum potassium 4.3  (03/18/2009)  Self-Management Support :   Personal  Goals (by the next clinic visit) :      Personal blood pressure goal: 140/90  (06/02/2009)     Personal LDL goal: 100  (06/02/2009)    Patient will work on the following items until the next clinic visit to reach self-care goals:     Medications and monitoring: take my medicines every day, bring all of my medications to every visit  (10/05/2009)     Eating: drink diet soda or water instead of juice or soda, eat more vegetables, use fresh or frozen vegetables, eat foods that are low in salt, eat baked foods instead of fried foods, eat fruit for snacks and desserts, limit or avoid alcohol  (10/05/2009)  Activity: take a 30 minute walk every day  (10/05/2009)    Hypertension self-management support: Written self-care plan, Education handout, Pre-printed educational material  (10/05/2009)   Hypertension self-care plan printed.   Hypertension education handout printed    Lipid self-management support: Written self-care plan, Education handout, Pre-printed educational material  (10/05/2009)   Lipid self-care plan printed.   Lipid education handout printed    Laboratory Results   Urine Tests  Date/Time Recieved: 10/05/2009 16:08 GH Date/Time Reported: 10/05/2009 16:11 GH  Routine Urinalysis   Color: lt. yellow Appearance: Hazy Glucose: negative   (Normal Range: Negative) Bilirubin: negative   (Normal Range: Negative) Ketone: negative   (Normal Range: Negative) Spec. Gravity: 1.010   (Normal Range: 1.003-1.035) Blood: trace-lysed   (Normal Range: Negative) pH: 5.5   (Normal Range: 5.0-8.0) Protein: negative   (Normal Range: Negative) Urobilinogen: 0.2   (Normal Range: 0-1) Nitrite: negative   (Normal Range: Negative) Leukocyte Esterace: small   (Normal Range: Negative)    Comments: Sent for Urinalysis and Culture per Dr. Scot Dock.Angelina Ok RN  October 05, 2009 4:25 PM       Immunizations Administered:  Influenza Vaccine # 1:    Vaccine Type: Fluvax MCR    Site:  left deltoid    Mfr: [employer]    Dose: 0.5 ml    Route: IM    Given by: Angelina Ok RN    Exp. Date: 07/23/2010    Lot #: [id number]    VIS given: 08/17/09 version given October 05, 2009.  Flu Vaccine Consent Questions:    Do you have a history of severe allergic reactions to this vaccine? no    Any prior history of allergic reactions to egg and/or gelatin? no    Do you have a sensitivity to the preservative Thimersol? no    Do you have a past history of Guillan-Barre Syndrome? no    Do you currently have an acute febrile illness? no    Have you ever had a severe reaction to latex? no    Vaccine information given and explained to patient? yes    Are you currently pregnant? no   Process Orders Check Orders Results:     [facility]: Check successful Tests Sent for requisitioning (October 07, 2009 2:00 PM):     10/05/2009: [facility] -- T-Culture, Urine [[id number]] (signed)

## 2010-02-22 NOTE — Assessment & Plan Note (Addendum)
Summary: back pain/gg   Vital Signs:  Patient profile:   60 year old female Menstrual status:  postmenopausal Height:      60.5 inches (153.67 cm) Weight:      168.1 pounds (76.41 kg) BMI:     32.41 O2 Sat:      97 % on Room air Temp:     98.2 degrees F (36.78 degrees C) oral Pulse rate:   87 / minute BP sitting:   123 / 73  (left arm) Cuff size:   large  Vitals Entered By: Cynda Familia Duncan Dull) (December 14, 2009 2:24 PM)  O2 Flow:  Room air CC: f/u back pain, cant sleep,  Depression (recently found out son in law has cancer) Is Patient Diabetic? No Research Study Name: bp taken from 11/11 visit Pain Assessment Patient in pain? yes     Location: back pain Intensity: 8 Type: sharp Onset of pain  Constant Nutritional Status BMI of > 30 = obese  Have you ever been in a relationship where you felt threatened, hurt or afraid?No   Does patient need assistance? Functional Status Self care Ambulation Normal   Primary Care Provider:  Julaine Fusi  DO  CC:  f/u back pain, cant sleep, and Depression (recently found out son in law has cancer).  History of Present Illness:  61 y/o woman with PMH significant for Depression and back pain comes into the clinic today for worsening back pain and getting her pain meds refilled as she was running out of them.  She reports that her back pain is getting worse and she  took last two vicodin pills before coming here. She is feeling better at this time under the effect of those pills.. She is sad, depressed and tearful as recently her son- in law was diagnosed with stage 4 colon cancer at the age of 79. She is worried about her grandchildren and daughter. "NO HOPE OF ANYTHING''. , 'Why can't I die instead of him''.  She also reports sleeping difficulties , she took benadryl, melatonin last night that didn't help her much.   Depression History:      The patient is having a depressed mood most of the day but denies diminished interest in  her usual daily activities.        The patient denies that she has thought about ending her life.        Comments:  recently found out son in law has colon cancer.  Preventive Screening-Counseling & Management  Alcohol-Tobacco     Alcohol drinks/day: 1-2 per yr     Alcohol type: WINE      Smoking Status: never  Current Medications (verified): 1)  Alprazolam 1 Mg Tabs (Alprazolam) .... Take 1 Tablet By Mouth Three Times A Day 2)  Vitamin D 91478 Unit Caps (Ergocalciferol) .... Take 1 Tablet By Mouth Once A Week (Monday) 3)  Lexapro 20 Mg Tabs (Escitalopram Oxalate) .... Take 1 Tablet By Mouth Once A Day 4)  Nexium 20 Mg Cpdr (Esomeprazole Magnesium) .... Take 1 Tablet By Mouth Once A Day 5)  Vicodin 5-500 Mg Tabs (Hydrocodone-Acetaminophen) .... Take 1 Tab By Mouth Q6 Hours As Neede For Back Pain. 6)  Celebrex 200 Mg Caps (Celecoxib) .... Take 1 Tablet By Mouth Two Times A Day 7)  Budeprion Sr 150 Mg Xr12h-Tab (Bupropion Hcl) .... Take 1 Tablet By Mouth Two Times A Day  Allergies (verified): 1)  ! Pcn 2)  ! Cephalexin 3)  Codeine  Review  of Systems      See HPI  The patient denies anorexia, fever, hoarseness, chest pain, syncope, prolonged cough, and headaches.    Physical Exam  General:  alert, well-developed, and well-nourished.   Head:  normocephalic and atraumatic.   Eyes:  vision grossly intact, pupils equal, pupils round, and pupils reactive to light.   Mouth:  pharynx pink and moist.   Neck:  supple and full ROM.   Lungs:  normal respiratory effort, no intercostal retractions, no accessory muscle use, normal breath sounds, no dullness, no fremitus, no crackles, and no wheezes.   Heart:  normal rate, regular rhythm, no murmur, no gallop, no rub, and no JVD.   Abdomen:  soft, non-tender, normal bowel sounds, no distention, no masses, no guarding, no rigidity, and no rebound tenderness.   Msk:  SLRT was negative. R and l knee joint has normal ROM, no joint swelling, no  joint warmth, and no redness over joints.   Pulses:  2+ pulses b/l. Extremities:  no cyanois, clubbing or edema. Neurologic:  alert & oriented X3, cranial nerves II-XII intact, strength normal in all extremities, sensation intact to light touch, gait normal, and DTRs symmetrical and normal.   Psych:  good eye contact, sad , tearful.   Impression & Recommendations:  Problem # 1:  BACK PAIN, LUMBAR (ICD-724.2) Assessment Comment Only She had lumbar spine X-RAY during the last visit that showed chronic advanced L4-L5 disc degeneration. Trace anterolisthesis at L3 and L4. She reports worsening back pain. SLRT was negative. In the absence of any signs for radiculopathy, and with her pain being well controlled with vicodin, she was continued on that. She was adviced to avoid lifting heavy objects and use vicodin sparingly. She was in a rush today but it seems like she might benefit from physical therapy. She was not ready to discuss any of the treatment options today. Her updated medication list for this problem includes:    Vicodin 5-500 Mg Tabs (Hydrocodone-acetaminophen) .Marland Kitchen... Take 1 tab by mouth q6 hours as neede for back pain.    Celebrex 200 Mg Caps (Celecoxib) .Marland Kitchen... Take 1 tablet by mouth two times a day  Problem # 2:  DEPRESSION (ICD-311) Assessment: Deteriorated She has chronic depression. The reason for her being  tearful and depressed during this visit was, her son- in law who has been recently diagnosed with stage 4 colon cancer. Denies  SI/HI. She was offered to talk to our social worker regarding psychotherapy and councelling but she refused. She has tried psychotherapy in the past but that does not help her. She definitely needs pysch referral but is not ready at this time.She was adviced to call the clinic if her depression symptoms get worse. Her updated medication list for this problem includes:    Alprazolam 1 Mg Tabs (Alprazolam) .Marland Kitchen... Take 1 tablet by mouth three times a day     Lexapro 20 Mg Tabs (Escitalopram oxalate) .Marland Kitchen... Take 1 tablet by mouth once a day    Budeprion Sr 150 Mg Xr12h-tab (Bupropion hcl) .Marland Kitchen... Take 1 tablet by mouth two times a day  Complete Medication List: 1)  Alprazolam 1 Mg Tabs (Alprazolam) .... Take 1 tablet by mouth three times a day 2)  Vitamin D 60454 Unit Caps (Ergocalciferol) .... Take 1 tablet by mouth once a week (monday) 3)  Lexapro 20 Mg Tabs (Escitalopram oxalate) .... Take 1 tablet by mouth once a day 4)  Nexium 20 Mg Cpdr (Esomeprazole magnesium) .... Take 1  tablet by mouth once a day 5)  Vicodin 5-500 Mg Tabs (Hydrocodone-acetaminophen) .... Take 1 tab by mouth q6 hours as neede for back pain. 6)  Celebrex 200 Mg Caps (Celecoxib) .... Take 1 tablet by mouth two times a day 7)  Budeprion Sr 150 Mg Xr12h-tab (Bupropion hcl) .... Take 1 tablet by mouth two times a day   Patient Instructions: 1)  Please keep up your apointment with Dr. Phillips Odor on Jan 5th. 2)  Please take your medicines as prescribed. Prescriptions: VICODIN 5-500 MG TABS (HYDROCODONE-ACETAMINOPHEN) Take 1 tab by mouth Q6 hours as neede for back pain.  #60 x 0   Entered and Authorized by:   Elyse Jarvis   Signed by:   Elyse Jarvis on 12/14/2009   Method used:   Print then Give to Patient   RxID:   [id number]    Orders Added: 1)  Est. Patient Level III [95621]     Prevention & Chronic Care Immunizations   Influenza vaccine: Fluvax MCR  (10/05/2009)    Tetanus booster: Not documented    Pneumococcal vaccine: Not documented    H. zoster vaccine: Not documented  Colorectal Screening   Hemoccult: Not documented    Colonoscopy: Results: Normal. Results: Diverticulosis.       Location:  [facility].  Location:   [facility] .    (12/15/2006)  Other Screening   Pap smear: Not documented    Mammogram: No specific mammographic evidence of malignancy.    (04/30/2006)   Mammogram action/deferral: Ordered   (03/18/2009)    DXA bone density scan: Not documented   Smoking status: never  (12/14/2009)  Lipids   Total Cholesterol: 207  (03/18/2009)   LDL: 89  (03/18/2009)   LDL Direct: Not documented   HDL: 44  (03/18/2009)   Triglycerides: 371  (03/18/2009)    SGOT (AST): 25  (03/18/2009)   SGPT (ALT): 30  (03/18/2009)   Alkaline phosphatase: 115  (03/18/2009)   Total bilirubin: 0.3  (03/18/2009)  Hypertension   Last Blood Pressure: 123 / 73  (12/14/2009)   Serum creatinine: 0.85  (03/18/2009)   Serum potassium 4.3  (03/18/2009)  Self-Management Support :   Personal Goals (by the next clinic visit) :      Personal blood pressure goal: 140/90  (06/02/2009)     Personal LDL goal: 100  (06/02/2009)    Patient will work on the following items until the next clinic visit to reach self-care goals:     Medications and monitoring: take my medicines every day  (12/14/2009)     Eating: eat foods that are low in salt, eat baked foods instead of fried foods  (12/14/2009)     Activity: take a 30 minute walk every day  (12/03/2009)    Hypertension self-management support: Written self-care plan, Education handout, Pre-printed educational material, Resources for patients handout  (12/03/2009)    Lipid self-management support: Written self-care plan, Education handout, Pre-printed educational material, Resources for patients handout  (12/03/2009)
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
## 2010-02-22 NOTE — Progress Notes (Addendum)
  Phone Note Call from Patient   Summary of Call: pt calls to c/o pain L hip radiating down to L ankle, using advil  but it does not help, ambulation and pressure make it worse. wakes pt up at night  painful  Follow-up for Phone Call        pateint has been seen Follow-up by: Julaine Fusi  DO,  Jun 08, 2009 2:42 PM

## 2010-02-22 NOTE — Progress Notes (Addendum)
Summary: Depresssion  Phone Note Call from Patient   Caller: Patient Summary of Call: Call from pt said that she is having crying spells.  Wants to know what to do. Does not feel that she will do anything to herself.  Was on Wellbutrin. Told by the folks at the [facility]. Pt was also sent to [facility].  Pt also stated that she did not want to come out for an appointment today.  Just wants to talk with someone today.  Offered an appointment.  Refused only wants to see Dr. Phillips Odor.  Angelina Ok RN  November 11, 2009 11:18 AM  Initial call taken by: Angelina Ok RN,  November 11, 2009 11:18 AM  Follow-up for Phone Call        Phone call with patient.  She is distressed about living alone and being separated from family and also has fin. issues.  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  I encouraged her to visit with me on Tuesday at 10 AM for further planning.  I also discussed getting into therapy.  She is connected to the [facility] for medication mgmt. Dorothe Pea  November 11, 2009 12:31 PM      Appended Document: Depresssion The patient was a no show for her appointment today.  I am sending resource material to her home regarding depression and depression resources.

## 2010-02-22 NOTE — Assessment & Plan Note (Addendum)
Summary: checkup [mkj]   Vital Signs:  Patient profile:   60 year old female Height:      60.5 inches (153.67 cm) Weight:      185 pounds (84.09 kg) BMI:     35.66 Temp:     96.8 degrees F (36 degrees C) oral Pulse rate:   78 / minute BP sitting:   131 / 70  (right arm)  Vitals Entered By: Angelina Ok RN (February 04, 2009 10:17 AM)  Nutrition Counseling: Patient's BMI is greater than 25 and therefore counseled on weight management options. Is Patient Diabetic? No Pain Assessment Patient in pain? no      Nutritional Status BMI of > 30 = obese  Have you ever been in a relationship where you felt threatened, hurt or afraid?No   Does patient need assistance? Functional Status Self care Ambulation Normal Comments Frequent falling.  Trobbing in legs.  Check up.   Primary Care Provider:  Julaine Fusi  DO   History of Present Illness: [name] comes in today for routine follow-up. No new complaints. Requesting dental referral.  Depression History:      The patient denies a depressed mood most of the day and a diminished interest in her usual daily activities.        The patient denies that she feels like life is not worth living, denies that she wishes that she were dead, and denies that she has thought about ending her life.         Preventive Screening-Counseling & Management  Alcohol-Tobacco     Alcohol type: WINE      Smoking Status: never  Current Medications (verified): 1)  Alprazolam 1 Mg Tabs (Alprazolam) .... Take 1 Tablet By Mouth Three Times A Day 2)  Vitamin D 28413 Unit Caps (Ergocalciferol) .... Take 1 Tablet By Mouth Once A Week (Monday) 3)  Lexapro 10 Mg Tabs (Escitalopram Oxalate) .... Take 1 Tablet By Mouth Once A Day 4)  Crestor 10 Mg Tabs (Rosuvastatin Calcium) .... Take 1 Tablet By Mouth Once A Day 5)  Nexium 20 Mg Cpdr (Esomeprazole Magnesium) .... Take 1 Tablet By Mouth Once A Day 6)  Topamax 100 Mg Tabs (Topiramate) .... Take 1 Tablet By Mouth  Once A Day 7)  Glucophage Xr 500 Mg Xr24h-Tab (Metformin Hcl) .... Take 1 Tablet By Mouth Once A Day  Allergies (verified): 1)  ! Pcn 2)  Codeine  Review of Systems  The patient denies anorexia, fever, weight loss, hoarseness, chest pain, syncope, dyspnea on exertion, headaches, hemoptysis, and abdominal pain.    Physical Exam  General:  Middle-aged overweight woman in NAD. Marland Kitchenalert, well-developed, well-nourished, and well-hydrated.   Lungs:  normal respiratory effort, no intercostal retractions, no accessory muscle use, normal breath sounds, no crackles, and no wheezes.   Heart:  normal rate, regular rhythm, and no murmur.   Abdomen:  soft, non-tender, normal bowel sounds, no distention, no masses, no guarding, and no rigidity.   Msk:  no joint swelling and no redness over joints.     Impression & Recommendations:  Problem # 1:  DEPRESSION (ICD-311) Refills on her meds today. Stable no changes. Her updated medication list for this problem includes:    Alprazolam 1 Mg Tabs (Alprazolam) .Marland Kitchen... Take 1 tablet by mouth three times a day    Lexapro 10 Mg Tabs (Escitalopram oxalate) .Marland Kitchen... Take 1 tablet by mouth once a day  Problem # 2:  HYPERLIPIDEMIA (ICD-272.4)  Continue Crestor. Due for lipid  panel in 5/11. Her updated medication list for this problem includes:    Crestor 10 Mg Tabs (Rosuvastatin calcium) .Marland Kitchen... Take 1 tablet by mouth once a day  Labs Reviewed: SGOT: 28 (06/01/2008)   SGPT: 42 (06/01/2008)   HDL:49 (06/01/2008), 42 (02/20/2008)  LDL:125 (06/01/2008), See Comment mg/dL (16/10/9602)  VWUJ:811 (06/01/2008), 226 (02/20/2008)  Trig:183 (06/01/2008), 607 (02/20/2008)  Problem # 3:  HYPERTENSION, BENIGN ESSENTIAL (ICD-401.1) At goal no changes.  BP today: 131/70 Prior BP: 121/78 (09/17/2008)  Labs Reviewed: K+: 3.9 (06/01/2008) Creat: : 0.75 (06/01/2008)   Chol: 211 (06/01/2008)   HDL: 49 (06/01/2008)   LDL: 125 (06/01/2008)   TG: 183 (06/01/2008)  Problem # 4:   Preventive Health Care (ICD-V70.0) requesting dental referral.  Complete Medication List: 1)  Alprazolam 1 Mg Tabs (Alprazolam) .... Take 1 tablet by mouth three times a day 2)  Vitamin D 91478 Unit Caps (Ergocalciferol) .... Take 1 tablet by mouth once a week (monday) 3)  Lexapro 10 Mg Tabs (Escitalopram oxalate) .... Take 1 tablet by mouth once a day 4)  Crestor 10 Mg Tabs (Rosuvastatin calcium) .... Take 1 tablet by mouth once a day 5)  Nexium 20 Mg Cpdr (Esomeprazole magnesium) .... Take 1 tablet by mouth once a day 6)  Topamax 100 Mg Tabs (Topiramate) .... Take 1 tablet by mouth once a day 7)  Glucophage Xr 500 Mg Xr24h-tab (Metformin hcl) .... Take 1 tablet by mouth once a day  Patient Instructions: 1)  Please schedule a follow-up appointment in 1-2  months. Prescriptions: GLUCOPHAGE XR 500 MG XR24H-TAB (METFORMIN HCL) Take 1 tablet by mouth once a day  #30 x 3   Entered and Authorized by:   Julaine Fusi  DO   Signed by:   Julaine Fusi  DO on 02/04/2009   Method used:   Electronically to        [facility]  [phone]* (retail)       [address]       [place], [place]  [address]       Ph: [phone] or [phone]       Fax: [phone]   RxID:   [id number]   Prevention & Chronic Care Immunizations   Influenza vaccine: Fluvax MCR  (11/03/2008)    Tetanus booster: Not documented    Pneumococcal vaccine: Not documented    H. zoster vaccine: Not documented  Colorectal Screening   Hemoccult: Not documented    Colonoscopy: Results: Normal. Results: Diverticulosis.       Location:  [facility].  Location:  [facility] .    (12/15/2006)  Other Screening   Pap smear: Not documented    Mammogram: Not documented    DXA bone density scan: Not documented   Smoking status: never  (02/04/2009)  Lipids   Total Cholesterol: 211  (06/01/2008)   LDL: 125  (06/01/2008)   LDL Direct: Not documented    HDL: 49  (06/01/2008)   Triglycerides: 183  (06/01/2008)    SGOT (AST): 28  (06/01/2008)   SGPT (ALT): 42  (06/01/2008)   Alkaline phosphatase: 146  (06/01/2008)   Total bilirubin: 0.4  (06/01/2008)  Hypertension   Last Blood Pressure: 131 / 70  (02/04/2009)   Serum creatinine: 0.75  (06/01/2008)   Serum potassium 3.9  (06/01/2008)  Self-Management Support :    Patient will work on the following items until the next clinic visit to reach self-care goals:  Medications and monitoring: take my medicines every day, bring all of my medications to every visit  (02/04/2009)     Eating: drink diet soda or water instead of juice or soda, eat more vegetables, eat foods that are low in salt  (02/04/2009)     Activity: take a 30 minute walk every day  (02/04/2009)    Hypertension self-management support: Not documented    Lipid self-management support: Not documented

## 2010-02-22 NOTE — Progress Notes (Addendum)
Summary: refill/ hla  Phone Note Refill Request Message from:  Fax from Pharmacy on September 23, 2009 4:21 PM  Refills Requested: Medication #1:  VICODIN 5-500 MG TABS Take 1 tablet by mouth every 6 hour for pain as needed.   Dosage confirmed as above?Dosage Confirmed   Last Refilled: 7/28 last visit 7/28  Initial call taken by: Marin Roberts RN,  September 23, 2009 4:22 PM  Follow-up for Phone Call        ok to call in one more script until I can see her for further work-up. Really want to avoid long term opiates in her for sure. Follow-up by: Julaine Fusi  DO,  September 29, 2009 12:05 PM    Prescriptions: VICODIN 5-500 MG TABS (HYDROCODONE-ACETAMINOPHEN) Take 1 tablet by mouth every 6 hour for pain as needed.  #60 x 0   Entered and Authorized by:   Julaine Fusi  DO   Signed by:   Julaine Fusi  DO on 09/29/2009   Method used:   Telephoned to ...       [facility]  [address]  [phone]* (retail)       [address]       [place], [place]  [address]       Ph: [phone] or [phone]       Fax: [phone]   RxID:   [id number]

## 2010-02-22 NOTE — Assessment & Plan Note (Addendum)
Summary: 98month f/u/est/vs   Vital Signs:  Patient profile:   60 year old female Menstrual status:  postmenopausal Height:      60.5 inches (153.67 cm) Weight:      177.02 pounds (80.46 kg) BMI:     34.13 O2 Sat:      95 % on Room air Temp:     96.8 degrees F (36 degrees C) oral Pulse rate:   85 / minute BP sitting:   126 / 76  (right arm) Cuff size:   large  Vitals Entered By: Angelina Ok RN (Jun 17, 2009 9:12 AM)  O2 Flow:  Room air CC: refill for neds. review results of x-ray Pain Assessment Patient in pain? no      Nutritional Status BMI of > 30 = obese  Have you ever been in a relationship where you felt threatened, hurt or afraid?No   Does patient need assistance? Functional Status Self care, Cook/clean, Shopping Ambulation Normal     Menstrual Status postmenopausal   Primary Care Provider:  Julaine Fusi  DO  CC:  refill for neds. review results of x-ray.  History of Present Illness: [name] comes in today for routine follow-up. Her complaints include-worsening depression, daily HA, fatigue, weight gain, and no chronic hip pain from a fall back in January. Imaging is negative for fracture. No MRI or advanced imaging has been done.  Preventive Screening-Counseling & Management  Alcohol-Tobacco     Alcohol drinks/day: 1-2 per yr     Alcohol type: WINE      Smoking Status: never  Current Medications (verified): 1)  Alprazolam 1 Mg Tabs (Alprazolam) .... Take 1 Tablet By Mouth Three Times A Day 2)  Vitamin D 16109 Unit Caps (Ergocalciferol) .... Take 1 Tablet By Mouth Once A Week (Monday) 3)  Lexapro 20 Mg Tabs (Escitalopram Oxalate) .... Take 1 Tablet By Mouth Once A Day 4)  Nexium 20 Mg Cpdr (Esomeprazole Magnesium) .... Take 1 Tablet By Mouth Once A Day 5)  Vicodin 5-500 Mg Tabs (Hydrocodone-Acetaminophen) .... Take 1 Tablet By Mouth Every 6 Hour For Pain As Needed. 6)  Celebrex 200 Mg Caps (Celecoxib) .... Take 1 Tablet By Mouth Two Times A  Day  Allergies (verified): 1)  ! Pcn 2)  Codeine  Review of Systems      See HPI  Physical Exam  General:  alert, well-developed, and well-nourished.   Neck:  supple, full ROM, and no masses.   Lungs:  normal respiratory effort, no intercostal retractions, no accessory muscle use, and normal breath sounds.   Heart:  normal rate and regular rhythm.   Abdomen:  soft, non-tender, normal bowel sounds, and no distention.   Msk:  normal ROM, no joint tenderness, no joint swelling, no joint warmth, and no redness over joints.  Left hip painful on passive abduction. pain on palpation on left  lower ribs. Psych:  Oriented X3, memory intact for recent and remote, normally interactive, good eye contact, and not anxious appearing.     Impression & Recommendations:  Problem # 1:  HYPERTENSION, BENIGN ESSENTIAL (ICD-401.1) No change. BP today: 126/76 Prior BP: 132/86 (06/02/2009)  Labs Reviewed: K+: 4.3 (03/18/2009) Creat: : 0.85 (03/18/2009)   Chol: 207 (03/18/2009)   HDL: 44 (03/18/2009)   LDL: 89 (03/18/2009)   TG: 371 (03/18/2009)  Problem # 2:  HYPERLIPIDEMIA (ICD-272.4) Patient does not want to take statin. Will repeat lipids in august at 6 months- low risk.  The following medications were removed from  the medication list:    Crestor 10 Mg Tabs (Rosuvastatin calcium) .Marland Kitchen... Take 1 tablet by mouth once a day  Labs Reviewed: SGOT: 25 (03/18/2009)   SGPT: 30 (03/18/2009)   HDL:44 (03/18/2009), 49 (06/01/2008)  LDL:89 (03/18/2009), 125 (60/45/4098)  Chol:207 (03/18/2009), 211 (06/01/2008)  Trig:371 (03/18/2009), 183 (06/01/2008)  Problem # 3:  INSOMNIA (ICD-780.52) Unspecified sleep disorder vs. bipolar depression. Benzodiazapines [facility] panic anxiety.  Problem # 4:  VITAMIN D DEFICIENCY (ICD-268.9) Will continue repletion and repeat levels in 3 months  Problem # 5:  BACK PAIN, LUMBAR (ICD-724.2) Chronic. Discussed need to avoid long term opiates. May need to get  more advanced imaging if pain continues.  Her updated medication list for this problem includes:    Vicodin 5-500 Mg Tabs (Hydrocodone-acetaminophen) .Marland Kitchen... Take 1 tablet by mouth every 6 hour for pain as needed.    Celebrex 200 Mg Caps (Celecoxib) .Marland Kitchen... Take 1 tablet by mouth two times a day  Problem # 6:  HIP PAIN (ICD-719.45) Becomeing a chroinic isssue- risk factors for chronic pain given her depression. May need PT rererral ior additional imaging. .  Her updated medication list for this problem includes:    Vicodin 5-500 Mg Tabs (Hydrocodone-acetaminophen) .Marland Kitchen... Take 1 tablet by mouth every 6 hour for pain as needed.    Celebrex 200 Mg Caps (Celecoxib) .Marland Kitchen... Take 1 tablet by mouth two times a day  Complete Medication List: 1)  Alprazolam 1 Mg Tabs (Alprazolam) .... Take 1 tablet by mouth three times a day 2)  Vitamin D 11914 Unit Caps (Ergocalciferol) .... Take 1 tablet by mouth once a week (monday) 3)  Lexapro 20 Mg Tabs (Escitalopram oxalate) .... Take 1 tablet by mouth once a day 4)  Nexium 20 Mg Cpdr (Esomeprazole magnesium) .... Take 1 tablet by mouth once a day 5)  Vicodin 5-500 Mg Tabs (Hydrocodone-acetaminophen) .... Take 1 tablet by mouth every 6 hour for pain as needed. 6)  Celebrex 200 Mg Caps (Celecoxib) .... Take 1 tablet by mouth two times a day  Patient Instructions: 1)  Please schedule a follow-up appointment in 3 months. Prescriptions: VICODIN 5-500 MG TABS (HYDROCODONE-ACETAMINOPHEN) Take 1 tablet by mouth every 6 hour for pain as needed.  #60 x 2   Entered and Authorized by:   Julaine Fusi  DO   Signed by:   Julaine Fusi  DO on 06/17/2009   Method used:   Print then Give to Patient   RxID:   [id number] CELEBREX 200 MG CAPS (CELECOXIB) Take 1 tablet by mouth two times a day  #60 x 3   Entered and Authorized by:   Julaine Fusi  DO   Signed by:   Julaine Fusi  DO on 06/17/2009   Method used:   Print then Give to Patient   RxID:    [id number]    Vital Signs:  Patient profile:   60 year old female Menstrual status:  postmenopausal Height:      60.5 inches (153.67 cm) Weight:      177.02 pounds (80.46 kg) BMI:     34.13 O2 Sat:      95 % Temp:     96.8 degrees F (36 degrees C) oral Pulse rate:   85 / minute BP sitting:   126 / 76  (right arm) Cuff size:   large  Vitals Entered By: Angelina Ok RN (Jun 17, 2009 9:12 AM)  O2 Flow:  Room air      Prevention &  Chronic Care Immunizations   Influenza vaccine: Fluvax MCR  (11/03/2008)    Tetanus booster: Not documented    Pneumococcal vaccine: Not documented    H. zoster vaccine: Not documented  Colorectal Screening   Hemoccult: Not documented    Colonoscopy: Results: Normal. Results: Diverticulosis.       Location:  [facility].  Location:  [facility] .    (12/15/2006)  Other Screening   Pap smear: Not documented    Mammogram: No specific mammographic evidence of malignancy.    (04/30/2006)   Mammogram action/deferral: Ordered  (03/18/2009)    DXA bone density scan: Not documented   Smoking status: never  (06/17/2009)  Lipids   Total Cholesterol: 207  (03/18/2009)   LDL: 89  (03/18/2009)   LDL Direct: Not documented   HDL: 44  (03/18/2009)   Triglycerides: 371  (03/18/2009)    SGOT (AST): 25  (03/18/2009)   SGPT (ALT): 30  (03/18/2009)   Alkaline phosphatase: 115  (03/18/2009)   Total bilirubin: 0.3  (03/18/2009)  Hypertension   Last Blood Pressure: 126 / 76  (06/17/2009)   Serum creatinine: 0.85  (03/18/2009)   Serum potassium 4.3  (03/18/2009)  Self-Management Support :   Personal Goals (by the next clinic visit) :      Personal blood pressure goal: 140/90  (06/02/2009)     Personal LDL goal: 100  (06/02/2009)    Patient will work on the following items until the next clinic visit to reach self-care goals:     Medications and monitoring: take my medicines every day, bring all of my  medications to every visit  (06/17/2009)     Eating: drink diet soda or water instead of juice or soda, eat more vegetables, use fresh or frozen vegetables, eat foods that are low in salt, eat baked foods instead of fried foods, limit or avoid alcohol  (06/17/2009)     Activity: take a 30 minute walk every day  (06/17/2009)    Hypertension self-management support: Written self-care plan, Education handout, Pre-printed educational material  (06/17/2009)   Hypertension self-care plan printed.   Hypertension education handout printed    Lipid self-management support: Written self-care plan, Education handout, Pre-printed educational material  (06/17/2009)   Lipid self-care plan printed.   Lipid education handout printed

## 2010-02-22 NOTE — Progress Notes (Addendum)
Summary: prior authorization-Bupropion/gp  Phone Note Outgoing Call   Summary of Call: Prior Authorization require for Bupropion (Wellbutrin) XL  24hr 150 mg.  [facility]; pt. needs to try Bupropion SR 12hr first; pt. needs to try it for 60 days then Step Therapy Exception form needs to be completed if you  still want pt. to be on the XL 24hr after the 60 days.  Thanks  Initial call taken by: Chinita Pester RN,  September 10, 2009 11:59 AM  Follow-up for Phone Call        At her last visit I changed her to twice daily Wellbutrin. Follow-up by: Julaine Fusi  DO,  October 27, 2009 4:03 PM

## 2010-02-22 NOTE — Assessment & Plan Note (Addendum)
Summary: EST-2 MONTH F/U VISIT/CH   Vital Signs:  Patient profile:   60 year old female Menstrual status:  postmenopausal Height:      60.5 inches (153.67 cm) Weight:      175.01 pounds (79.55 kg) BMI:     33.74 Temp:     97.4 degrees F (36.33 degrees C) oral Pulse rate:   74 / minute BP sitting:   131 / 77  (right arm)  Vitals Entered By: Angelina Ok RN (October 21, 2009 11:13 AM) CC: Depression Is Patient Diabetic? No Pain Assessment Patient in pain? yes     Location: lower back Intensity: 10 Type: aching Onset of pain  Constant Nutritional Status BMI of > 30 = obese  Have you ever been in a relationship where you felt threatened, hurt or afraid?No   Does patient need assistance? Functional Status Self care Ambulation Normal Comments Dog bite her and she took an antibiotic.  Cleared.  Refills needed.  Wellbutrin not covered by insurance.   Primary Care Provider:  Julaine Fusi  DO  CC:  Depression.  History of Present Illness: Berton Mount comes in today for routine follow-up on her nxiety and chronic hip pain. Overall she feels tired and sluggish at baseline. She has severe depression that is suboptimally treated and poor follow-through with psychiatry. "Stas in bed all the time"- reports that it ios mostly just her "life situation". Feels isolated and lonely.   Depression History:      The patient is having a depressed mood most of the day and has a diminished interest in her usual daily activities.        The patient denies that she feels like life is not worth living, denies that she wishes that she were dead, and denies that she has thought about ending her life.         Preventive Screening-Counseling & Management  Alcohol-Tobacco     Alcohol drinks/day: 1-2 per yr     Alcohol type: WINE      Smoking Status: never  Allergies: 1)  ! Pcn 2)  ! Cephalexin 3)  Codeine   Impression & Recommendations:  Problem # 1:  BACK PAIN, LUMBAR (ICD-724.2) Will try  to avoid opiates except for occasionally for pain at night. Needs NSAIDS regularly. Back pain is now chronic from DJD.  Her updated medication list for this problem includes:    Hydrocodone-acetaminophen 10-325 Mg Tabs (Hydrocodone-acetaminophen) .Marland Kitchen... Take 1/2 to 1 tablet by mouth every 12 hours as needed for pain    Celebrex 200 Mg Caps (Celecoxib) .Marland Kitchen... Take 1 tablet by mouth two times a day  Problem # 2:  HIP PAIN (ICD-719.45) Will consider MI imaging if not improved. Xrays negative for fracture. May be referred pain from [facility].  Her updated medication list for this problem includes:    Hydrocodone-acetaminophen 10-325 Mg Tabs (Hydrocodone-acetaminophen) .Marland Kitchen... Take 1/2 to 1 tablet by mouth every 12 hours as needed for pain    Celebrex 200 Mg Caps (Celecoxib) .Marland Kitchen... Take 1 tablet by mouth two times a day  Problem # 3:  DEPRESSION (ICD-311) Severe with anxiety. Started on Buproprion and takes Alprazolam for anxiety component. BZD previously prescribed by mental health. Will check [place] sdatabase and verify. Lexapro prescribed by mental health. She needs counseling but has refused previously. Her updated medication list for this problem includes:    Alprazolam 1 Mg Tabs (Alprazolam) .Marland Kitchen... Take 1 tablet by mouth three times a day    Lexapro 20 Mg  Tabs (Escitalopram oxalate) .Marland Kitchen... Take 1 tablet by mouth once a day    Budeprion Sr 150 Mg Xr12h-tab (Bupropion hcl) .Marland Kitchen... Take 1 tablet by mouth two times a day  Complete Medication List: 1)  Alprazolam 1 Mg Tabs (Alprazolam) .... Take 1 tablet by mouth three times a day 2)  Vitamin D 66440 Unit Caps (Ergocalciferol) .... Take 1 tablet by mouth once a week (monday) 3)  Lexapro 20 Mg Tabs (Escitalopram oxalate) .... Take 1 tablet by mouth once a day 4)  Nexium 20 Mg Cpdr (Esomeprazole magnesium) .... Take 1 tablet by mouth once a day 5)  Hydrocodone-acetaminophen 10-325 Mg Tabs (Hydrocodone-acetaminophen) .... Take 1/2 to 1 tablet by mouth every 12 hours as  needed for pain 6)  Celebrex 200 Mg Caps (Celecoxib) .... Take 1 tablet by mouth two times a day 7)  Budeprion Sr 150 Mg Xr12h-tab (Bupropion hcl) .... Take 1 tablet by mouth two times a day  Patient Instructions: 1)  Please schedule a follow-up appointment in 3 months. Prescriptions: BUDEPRION SR 150 MG XR12H-TAB (BUPROPION HCL) Take 1 tablet by mouth two times a day  #60 x 0   Entered and Authorized by:   Julaine Fusi  DO   Signed by:   Julaine Fusi  DO on 10/21/2009   Method used:   Electronically to        [facility]  [phone]* (retail)       [address]       [place], [place]  [address]       Ph: [phone] or [phone]       Fax: [phone]   RxID:   [id number] HYDROCODONE-ACETAMINOPHEN 10-325 MG TABS (HYDROCODONE-ACETAMINOPHEN) Take 1/2 to 1 tablet by mouth every 12 hours as needed for pain  #60 x 1   Entered and Authorized by:   Julaine Fusi  DO   Signed by:   Julaine Fusi  DO on 10/21/2009   Method used:   Print then Give to Patient   RxID:   [id number]    Vital Signs:  Patient profile:   60 year old female Menstrual status:  postmenopausal Height:      60.5 inches (153.67 cm) Weight:      175.01 pounds (79.55 kg) BMI:     33.74 Temp:     97.4 degrees F (36.33 degrees C) oral Pulse rate:   74 / minute BP sitting:   131 / 77  (right arm)  Vitals Entered By: Angelina Ok RN (October 21, 2009 11:13 AM)   Prevention & Chronic Care Immunizations   Influenza vaccine: Fluvax MCR  (10/05/2009)    Tetanus booster: Not documented    Pneumococcal vaccine: Not documented    H. zoster vaccine: Not documented  Colorectal Screening   Hemoccult: Not documented    Colonoscopy: Results: Normal. Results: Diverticulosis.       Location:  [facility].  Location:  [facility] .    (12/15/2006)  Other Screening   Pap smear: Not documented    Mammogram: No specific mammographic evidence of  malignancy.    (04/30/2006)   Mammogram action/deferral: Ordered  (03/18/2009)    DXA bone density scan: Not documented   Smoking status: never  (10/21/2009)  Lipids   Total Cholesterol: 207  (03/18/2009)   LDL: 89  (03/18/2009)   LDL Direct: Not documented   HDL: 44  (03/18/2009)   Triglycerides: 371  (03/18/2009)  SGOT (AST): 25  (03/18/2009)   SGPT (ALT): 30  (03/18/2009)   Alkaline phosphatase: 115  (03/18/2009)   Total bilirubin: 0.3  (03/18/2009)  Hypertension   Last Blood Pressure: 131 / 77  (10/21/2009)   Serum creatinine: 0.85  (03/18/2009)   Serum potassium 4.3  (03/18/2009)  Self-Management Support :   Personal Goals (by the next clinic visit) :      Personal blood pressure goal: 140/90  (06/02/2009)     Personal LDL goal: 100  (06/02/2009)    Patient will work on the following items until the next clinic visit to reach self-care goals:     Medications and monitoring: take my medicines every day, bring all of my medications to every visit  (10/21/2009)     Eating: drink diet soda or water instead of juice or soda, eat more vegetables, use fresh or frozen vegetables, eat foods that are low in salt, eat baked foods instead of fried foods, eat fruit for snacks and desserts, limit or avoid alcohol  (10/21/2009)     Activity: take a 30 minute walk every day  (10/21/2009)    Hypertension self-management support: Written self-care plan, Education handout, Pre-printed educational material, Resources for patients handout  (10/21/2009)   Hypertension self-care plan printed.   Hypertension education handout printed    Lipid self-management support: Written self-care plan, Education handout, Pre-printed educational material, Resources for patients handout  (10/21/2009)   Lipid self-care plan printed.   Lipid education handout printed      Resource handout printed.

## 2010-02-22 NOTE — Progress Notes (Addendum)
Summary: Refill/gh  Phone Note Refill Request Message from:  Patient on July 12, 2009 8:55 AM  Refills Requested: Medication #1:  NEXIUM 20 MG CPDR Take 1 tablet by mouth once a day  Method Requested: Electronic Initial call taken by: Angelina Ok RN,  July 12, 2009 8:55 AM  Follow-up for Phone Call       Follow-up by: Blanch Media MD,  July 12, 2009 9:53 AM    Prescriptions: NEXIUM 20 MG CPDR (ESOMEPRAZOLE MAGNESIUM) Take 1 tablet by mouth once a day  #30 x 11   Entered and Authorized by:   Blanch Media MD   Signed by:   Blanch Media MD on 07/12/2009   Method used:   Electronically to        [facility]  [phone]* (retail)       [address]       [place], [place]  [address]       Ph: [phone] or [phone]       Fax: [phone]   RxID:   [id number]
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
## 2010-02-24 NOTE — Progress Notes (Signed)
Summary: Refill/gh  Phone Note Refill Request Message from:  Fax from Pharmacy on January 19, 2010 11:01 AM  Refills Requested: Medication #1:  BUDEPRION SR 150 MG XR12H-TAB Take 1 tablet by mouth two times a day.   Last Refilled: 10/21/2009  Method Requested: Electronic Initial call taken by: Angelina Ok RN,  January 19, 2010 11:02 AM  Follow-up for Phone Call        Refill approved-nurse to complete Follow-up by: Julaine Fusi  DO,  January 19, 2010 11:18 AM    Prescriptions: BUDEPRION SR 150 MG XR12H-TAB (BUPROPION HCL) Take 1 tablet by mouth two times a day  #60 x 0   Entered and Authorized by:   Julaine Fusi  DO   Signed by:   Julaine Fusi  DO on 01/19/2010   Method used:   Electronically to        [facility]  [phone]* (retail)       [address]       [place], [place]  [address]       Ph: [phone] or [phone]       Fax: [phone]   RxID:   [id number]

## 2010-02-24 NOTE — Progress Notes (Signed)
Summary: Anxiety  Phone Note Call from Patient   Caller: Patient Call For: Julaine Fusi  DO Action Taken: Appt Scheduled Today Summary of Call: Call from pt said that she has just learned that her son-in-law has Colon Cancer.  Wants to see Dr. Phillips Odor sooner than her 01-27-2010 appointment.  Having back pain.  Has headaches.  Feels terrible.  Has fallen a couple of times.  Not sleeping well.  Unable to focus on things very well.  Cannot remember the days of the week.   Pt was given an appointment for Friday.  Initial call taken by: Angelina Ok RN,  December 01, 2009 3:39 PM  Follow-up for Phone Call        agree that she needs to be seen. She needs to have psychiatric evaluation and be scheduled for counseling. I will forward to Lupita Leash T for assisitance and follow-up. Follow-up by: Julaine Fusi  DO,  December 03, 2009 4:01 PM

## 2010-02-24 NOTE — Progress Notes (Signed)
Summary: refill/ hla  Phone Note Refill Request Message from:  Patient on December 27, 2009 4:50 PM  Refills Requested: Medication #1:  VICODIN 5-500 MG TABS Take 1 tab by mouth Q6 hours as neede for back pain.   Dosage confirmed as above?Dosage Confirmed   Last Refilled: 11/22  Medication #2:  ALPRAZOLAM 1 MG TABS Take 1 tablet by mouth three times a day Pt has been taking 3 times a day.  Pt has 1 left.  Is going through a lot with her family.  Pt is tearful.  Initial call taken by: Marin Roberts RN,  December 27, 2009 4:50 PM  Follow-up for Phone Call        Needs re-evaluation. Recently seen on 11/22 and given refills. Follow-up by: Julaine Fusi  DO,  January 05, 2010 12:31 AM

## 2010-03-01 ENCOUNTER — Other Ambulatory Visit: Payer: Self-pay | Admitting: *Deleted

## 2010-03-01 NOTE — Telephone Encounter (Signed)
Pt called with c/o back pain and joint pain.   She prefers only to see Dr Phillips Odor. Pt has appointment with Dr Phillips Odor in April.

## 2010-03-02 MED ORDER — HYDROCODONE-ACETAMINOPHEN 5-500 MG PO TABS: 1.0000 | ORAL_TABLET | Freq: Three times a day (TID) | ORAL | Status: AC | PRN

## 2010-03-02 NOTE — Telephone Encounter (Signed)
Rx called in 

## 2010-05-05 ENCOUNTER — Ambulatory Visit (INDEPENDENT_AMBULATORY_CARE_PROVIDER_SITE_OTHER): Payer: Medicare Other | Admitting: Internal Medicine

## 2010-05-05 ENCOUNTER — Encounter: Payer: Self-pay | Admitting: Internal Medicine

## 2010-05-05 DIAGNOSIS — M25559 Pain in unspecified hip: Secondary | ICD-10-CM

## 2010-05-05 DIAGNOSIS — E8881 Metabolic syndrome: Secondary | ICD-10-CM

## 2010-05-05 MED ORDER — ALPRAZOLAM 0.5 MG PO TABS: 0.5000 mg | ORAL_TABLET | Freq: Three times a day (TID) | ORAL | Status: DC | PRN

## 2010-05-05 MED ORDER — FAMOTIDINE 40 MG PO TABS: 40.0000 mg | ORAL_TABLET | Freq: Every day | ORAL | Status: DC

## 2010-05-05 MED ORDER — EXENATIDE 5 MCG/0.02ML ~~LOC~~ SOPN: 5.0000 ug | PEN_INJECTOR | Freq: Two times a day (BID) | SUBCUTANEOUS | Status: DC

## 2010-05-05 MED ORDER — INSULIN PEN NEEDLE 31G X 5 MM MISC: 2.0000 "pen " | Freq: Two times a day (BID) | Status: DC

## 2010-05-05 MED ORDER — BUPROPION HCL ER (XL) 150 MG PO TB24: 150.0000 mg | ORAL_TABLET | ORAL | Status: DC

## 2010-05-05 MED ORDER — HYDROCODONE-ACETAMINOPHEN 5-500 MG PO TABS: 2.0000 | ORAL_TABLET | Freq: Four times a day (QID) | ORAL | Status: DC | PRN

## 2010-05-13 ENCOUNTER — Other Ambulatory Visit: Payer: Self-pay | Admitting: Internal Medicine

## 2010-05-16 ENCOUNTER — Telehealth: Payer: Self-pay | Admitting: *Deleted

## 2010-05-16 MED ORDER — ALPRAZOLAM 0.5 MG PO TABS: 1.0000 mg | ORAL_TABLET | Freq: Three times a day (TID) | ORAL | Status: AC | PRN

## 2010-05-16 MED ORDER — HYDROCODONE-ACETAMINOPHEN 5-500 MG PO TABS: 2.0000 | ORAL_TABLET | Freq: Four times a day (QID) | ORAL | Status: AC | PRN

## 2010-05-16 NOTE — Telephone Encounter (Signed)
Pt called stating she is moving and having a hard time dealing with this.  She has no ambition to pack.  Returned call to get more information and pt not answering phone.  Will call back

## 2010-05-16 NOTE — Telephone Encounter (Signed)
Ok to call in #60 vicodin for pain related to moving- she needs mental health evaluation when she gets to [place].

## 2010-05-16 NOTE — Telephone Encounter (Signed)
Rx called in and message left for pt

## 2010-05-16 NOTE — Telephone Encounter (Signed)
.    She is moving to [place] this week to be with her daughter. She can't get ready for move.  She gets dressed and then can only sit.  She wanders around the house. She is crying and wants to know why her xanax was changed to 0.5 from 1 mg.  She is out of vicodin. She does not want to go up to the attic because of spiders.   She does not want to move, crying   Please call the pt.

## 2010-05-16 NOTE — Telephone Encounter (Signed)
Rx called in for pt

## 2010-06-07 NOTE — Op Note (Signed)
NAME:  [name], [name]         ACCOUNT NO.:  [id number]   MEDICAL RECORD NO.:  [id number]          PATIENT TYPE:  EMS   LOCATION:  [facility]                         FACILITY:  [facility]   PHYSICIAN:  Lyndal Pulley. Chales Salmon, M.D.   DATE OF BIRTH:  Jul 13, 1950   DATE OF PROCEDURE:  09/13/2006  DATE OF DISCHARGE:                               OPERATIVE REPORT   PREOPERATIVE DIAGNOSIS:  Lower lid and chin through-and-through stellate  full-thickness laceration.   POSTOPERATIVE DIAGNOSIS:  Lower lid and chin through-and-through  stellate full-thickness laceration.   OPERATION PERFORMED:  1. Complicated closure with adjacent tissue transfer of a laceration      involving the patient's lower lip and chin area including the      vermilion border measuring approximately four centimeters wide and      three centimeters in height.  2. Closure of an intraoral mucosal laceration of the lower lip      measuring approximately four centimeters  in length   SURGEON:  Lyndal Pulley. Chales Salmon, M.D.   ANESTHESIA:  Local anesthesia.   INDICATIONS:  The patient is a 60 year old lady who sustained a severe  lower lip, chin and intraoral laceration after rolling over on her dog  in bed sustaining a dog bite.  The patient was initially evaluated in  the emergency department at [facility]; and, I was called for  consultation and treatment.   DESCRIPTION OF THE OPERATION:  The patient was initially identified in  the emergency department and given approximately 20 mL of 1% lidocaine  with 1:1,000 of epinephrine, which was injected over the entire lower  lip and chin area subcutaneously.  In addition, approximately 6 mL was  utilized for bilateral mental nerve blocks intraorally as well as an  additional local injected submucosally.   After I maintained profound local anesthesia the laceration was  carefully examined.  The laceration extended or covered an area of  approximately 12 squared centimeters in size  involving her entire lower  lip and chin area.  Several areas of the laceration extended over the  vermilion border of her lower lip primarily in three areas.  There did  not appear to be any significant loss of tissue or avulsion of tissue  from the dog bite.  The laceration also extended through-and-through the  lower lip and chin area with an approximate 4 cm in length laceration  involving the mucosa of her lower lip.   The wound was thoroughly debrided and scrubbed with Betadine solution.  The wound was then irrigated with copious amounts of sterile saline.  Initially, the mucosal laceration was closed with 3-0 chromic gut suture  in a running baseball fashion.  Once the intraoral portion of the  laceration was completed attention was then directed extraoral where  several interrupted 4-0 Vicryl sutures were used reapproximating the  underlying orbicularis muscle and deep tissue.  Hemostasis was achieved  throughout the procedure as well with electrocautery.  Once the deeper  sutures were placed it was necessary to advance the adjacent lower lip  and chin tissue into the wound using several 4-0 Vicryl sutures in a  subcutaneous plane.  The skin edges appeared to be well approximated at  this point and several 6-0 nylon sutures in an interrupted fashion were  used to better reapproximate the skin edges.  Care was taken to realign  the vermilion border of the lower left in three separate areas.  After  the wound edges were all reapproximated adequately the remaining skin  lacerations were closed, again, with 6-0 nylon suture, both in a running  baseball fashion as well as several additional interrupted sutures.  The  sutured areas were then again carefully cleaned with sterile saline  solution.  The wounds were coated with Neosporin ointment; and, a light  dressing was placed and held secure with paper tape.   INSTRUCTIONS GIVEN:  Postoperative wound instructions were discussed in   detail with the patient.  She was given a prescription for Keflex 500 mg  to be taken four times a day for 10 full days.  She also was given  Vicodin ES to be taken on an as needed basis for pain.  She also was  instructed to contact the office for follow up and suture removal in  approximately or about 1 week.   ESTIMATED BLOOD LOSS:  Estimated blood loss was minimal.      Lyndal Pulley. Chales Salmon, M.D.  Electronically Signed     TGO/MEDQ  D:  09/16/2006  T:  09/17/2006  Job:  [id number]

## 2010-11-15 ENCOUNTER — Other Ambulatory Visit: Payer: Self-pay | Admitting: Internal Medicine

## 2010-11-15 NOTE — Telephone Encounter (Signed)
Denied. Last office note 4/12 not completed. Last tele message 4/12 stated moving to [place] to live with daughter.

## 2010-11-16 NOTE — Telephone Encounter (Signed)
Denial was called to the [facility].

## 2011-01-04 DIAGNOSIS — E8881 Metabolic syndrome: Secondary | ICD-10-CM | POA: Insufficient documentation

## 2011-01-04 NOTE — Progress Notes (Signed)
Patient ID: [name], female   DOB: 11-13-1950, 60 y.o.   MRN: [id number]  Subjective:   Patient ID: [name] female   DOB: 29-Sep-1950 60 y.o.   MRN: [id number]  HPI: [name] is a 60 y.o. with chronic pain, depression and metabolic syndrome. Comes in for routine follow-up. Major complaints are weight gain, hip pain and her usual baseline anhedonia.    No past medical history on file. Current Outpatient Prescriptions  Medication Sig Dispense Refill  . buPROPion (WELLBUTRIN XL) 150 MG 24 hr tablet Take 1 tablet (150 mg total) by mouth every morning.  30 tablet  3  . exenatide (BYETTA 5 MCG PEN) 5 MCG/0.02ML SOLN Inject 0.02 mLs (5 mcg total) into the skin 2 (two) times daily with a meal.  1.2 mL  3  . famotidine (PEPCID) 40 MG tablet Take 1 tablet (40 mg total) by mouth daily.  30 tablet  11  . HYDROcodone-acetaminophen (VICODIN) 5-500 MG per tablet Take 2 tablets by mouth every 6 (six) hours as needed for pain.  60 tablet  0  . Insulin Pen Needle (B-D UF III MINI PEN NEEDLES) 31G X 5 MM MISC 2 pens by Does not apply route 2 (two) times daily.  60 each  3   No family history on file. History   Social History  . Marital Status: Divorced    Spouse Name: N/A    Number of Children: N/A  . Years of Education: N/A   Social History Main Topics  . Smoking status: Never Smoker   . Smokeless tobacco: None  . Alcohol Use: None  . Drug Use: None  . Sexually Active: None   Other Topics Concern  . None   Social History Narrative  . None   Review of Systems: Constitutional: Denies fever, chills, diaphoresis, appetite change and fatigue.  HEENT: Denies photophobia, eye pain, redness, hearing loss, ear pain, congestion, sore throat, rhinorrhea, sneezing, mouth sores, trouble swallowing, neck pain, neck stiffness and tinnitus.   Respiratory: Denies SOB, DOE, cough, chest tightness,  and wheezing.   Cardiovascular: Denies chest pain, palpitations and leg  swelling.  Gastrointestinal: Denies nausea, vomiting, abdominal pain, diarrhea, constipation, blood in stool and abdominal distention.  Genitourinary: Denies dysuria, urgency, frequency, hematuria, flank pain and difficulty urinating.  Musculoskeletal: Denies myalgias, back pain, joint swelling, arthralgias and gait problem.  Skin: Denies pallor, rash and wound.  Neurological: Denies dizziness, seizures, syncope, weakness, light-headedness, numbness and headaches.  Hematological: Denies adenopathy. Easy bruising, personal or family bleeding history  Psychiatric/Behavioral: Denies suicidal ideation, mood changes, confusion, nervousness, sleep disturbance and agitation  Objective:  Physical Exam: Filed Vitals:   05/05/10 0934  BP: 120/79  Pulse: 96  Temp: 98.4 F (36.9 C)  TempSrc: Oral  Height: 5' (1.524 m)  Weight: 79.878 kg (176 lb 1.6 oz)   Constitutional: Vital signs reviewed.  Patient is a well-developed and well-nourished in no acute distress and cooperative with exam. Alert and oriented x3.  Head: Normocephalic and atraumatic Ear: TM normal bilaterally Mouth: no erythema or exudates, MMM Eyes: PERRL, EOMI, conjunctivae normal, No scleral icterus.  Neck: Supple, Trachea midline normal ROM, No JVD, mass, thyromegaly, or carotid bruit present.  Cardiovascular: RRR, S1 normal, S2 normal, no MRG, pulses symmetric and intact bilaterally Pulmonary/Chest: CTAB, no wheezes, rales, or rhonchi Abdominal: Soft. Non-tender, non-distended, bowel sounds are normal, no masses, organomegaly, or guarding present.  GU: no CVA tenderness Musculoskeletal: No joint deformities, erythema, or stiffness, ROM full and no  nontender Hematology: no cervical, inginal, or axillary adenopathy.  Neurological: A&O x3, Strenght is normal and symmetric bilaterally, cranial nerve II-XII are grossly intact, no focal motor deficit, sensory intact to light touch bilaterally.  Skin: Warm, dry and intact. No rash,  cyanosis, or clubbing.  Psychiatric: Normal mood and affect. speech and behavior is normal. Judgment and thought content normal. Cognition and memory are normal.   Assessment & Plan:   1. Will start Byetta for weightloss and metabolic syndrome. F/U in 4 weeks. Weight Loss counseling today. 2. Continue current pain regimen for chronic hip/back pain 3. Continue to be folowed at Mental health for depression.

## 2011-01-04 NOTE — Assessment & Plan Note (Signed)
Continue current regimen. Prn hydrocodone and NSAIDS.

## 2011-04-10 ENCOUNTER — Encounter: Payer: Self-pay | Admitting: Internal Medicine

## 2011-04-10 ENCOUNTER — Ambulatory Visit (INDEPENDENT_AMBULATORY_CARE_PROVIDER_SITE_OTHER): Payer: Medicare Other | Admitting: Internal Medicine

## 2011-04-10 VITALS — BP 128/87 | HR 81 | Temp 97.1°F | Resp 20 | Ht 60.5 in | Wt 168.6 lb

## 2011-04-10 DIAGNOSIS — Z131 Encounter for screening for diabetes mellitus: Secondary | ICD-10-CM

## 2011-04-10 DIAGNOSIS — E559 Vitamin D deficiency, unspecified: Secondary | ICD-10-CM

## 2011-04-10 DIAGNOSIS — I10 Essential (primary) hypertension: Secondary | ICD-10-CM

## 2011-04-10 DIAGNOSIS — R1310 Dysphagia, unspecified: Secondary | ICD-10-CM | POA: Insufficient documentation

## 2011-04-10 DIAGNOSIS — E785 Hyperlipidemia, unspecified: Secondary | ICD-10-CM

## 2011-04-10 DIAGNOSIS — F411 Generalized anxiety disorder: Secondary | ICD-10-CM

## 2011-04-10 DIAGNOSIS — Z79899 Other long term (current) drug therapy: Secondary | ICD-10-CM

## 2011-04-10 DIAGNOSIS — R635 Abnormal weight gain: Secondary | ICD-10-CM

## 2011-04-10 DIAGNOSIS — E8881 Metabolic syndrome: Secondary | ICD-10-CM

## 2011-04-10 DIAGNOSIS — F419 Anxiety disorder, unspecified: Secondary | ICD-10-CM

## 2011-04-10 DIAGNOSIS — F329 Major depressive disorder, single episode, unspecified: Secondary | ICD-10-CM

## 2011-04-10 MED ORDER — OMEPRAZOLE 20 MG PO CPDR: 20.0000 mg | DELAYED_RELEASE_CAPSULE | Freq: Every day | ORAL | Status: DC

## 2011-04-10 MED ORDER — ALPRAZOLAM 0.5 MG PO TABS: 0.5000 mg | ORAL_TABLET | Freq: Three times a day (TID) | ORAL | Status: DC | PRN

## 2011-04-10 MED ORDER — BUPROPION HCL ER (XL) 300 MG PO TB24: 300.0000 mg | ORAL_TABLET | ORAL | Status: DC

## 2011-04-10 MED ORDER — ESCITALOPRAM OXALATE 20 MG PO TABS: 20.0000 mg | ORAL_TABLET | Freq: Every day | ORAL | Status: DC

## 2011-04-10 NOTE — Patient Instructions (Signed)
Schedule a follow up appointment with Dr. Arvilla Market in the end of March Keep taking your medicines as directed Your refills were sent to [facility]. I will call you if any of your blood work is abnormal

## 2011-04-10 NOTE — Progress Notes (Deleted)
Patient ID: [name], female   DOB: 02/16/1950, 61 y.o.   MRN: [id number]

## 2011-04-10 NOTE — Assessment & Plan Note (Signed)
Patient states her depression is well controlled with Lexapro and Wellbutrin. She denies suicidal or homicidal ideation. Despite increased rest of her her financial situation, she declines a visit with a Child psychotherapist for assistance.  Will continue to follow up on her with this

## 2011-04-10 NOTE — Assessment & Plan Note (Signed)
BP at goal without any medical therapy.  If BP within normal limits at her next OV, will remove HTN from her problem list.

## 2011-04-10 NOTE — Assessment & Plan Note (Signed)
Patient reports the sensation of intermittent dysphagia to both food and fluids in her lower esophagus. She has a history of GERD but states her symptoms are well controlled with omeprazole which she takes daily; she denies abnormal weight loss, night sweats, or blood protein, dark tarry bowel movements, regurgitation, or vomiting.  I believe it is reasonable to proceed with evaluation by GI; she may benefit from barium swallow and/or EGD.  She is amenable to referral, will place order for referral today.  WIll check CBC today. Pt reports colonoscopy within the last 3 years; will attempt to locate a copy of the report.

## 2011-04-10 NOTE — Assessment & Plan Note (Addendum)
Patient feels that her anxiety and panic d/o are well controlled.  She is currently using xanax an average of 1 time per day.  She states she has weaned herself off of Xanax in there past but prefers to keep this as part of her ongoing medication regimen because it is the only medicine that helps her anxiety and panic.  Informed patient that xanax is not ideal for treatment of anxiety/panic attack 2/2 dependence, withdrawal, etc and that treatment typically includes medical therapy with a SSRI in addition to CBT/relaxation techniques, etc.  That being said, if xanax allows her to remain functional and improves her well-being/quality of life, then I see no harm in continuing it.  WIll continue to follow up on this with her.

## 2011-04-10 NOTE — Assessment & Plan Note (Signed)
WIll check vitamin D level today and replete with 50,000u qwk x 5 weeks if indicated.

## 2011-04-10 NOTE — Progress Notes (Signed)
Subjective:     Patient ID: [name], female   DOB: 11/04/1950, 61 y.o.   MRN: [id number]  HPI Patient is a 61 y/o F presenting for routine f/u to re-establish care; last seen here ion 04/2010.  Depression: pt reports recent death increase in stress 2/2 of her son-in-law from stage IV colon cancer and also recent death of her beloved dog.  She also reports significant financial difficulties.  She is currently taking wellbutrin, lexapro, and xanax for treatment of depression and anxiety with panic attack. States her symptoms are well controlled on this regimen.  She also has a bottle of Ativan 0.5mg  with her but states she is not taking this.  She says another physican gave her Ativan in an attempt to transition off of xanax which she has been on for years; pt reports Ativan did not work.  She denies suicidal and homicidal ideation.  Metabolic syndrome: pt dx'd with this last year and rx'd Byetta.  States she never took Byetta because she refuses to administer her own injections.  Denies any abnormal weight loss, polyuria, or polydipsia.  Dysphagia: pt reports intermittent dysphagia with food and fluids for man "months."  She denies any abdominal pain, regurgitation of food, vomiting, diarrhea, BRBPR, or dark tarry stools.    Weight gain: pt reports weight gain of 30lb over the past few months while she was staying with her daughter and helping her daughter and grandchildren through the recent illness and death of their father.     Review of Systems Constitutional: Negative for fever, chills, diaphoresis, activity change, appetite change, fatigue and unexpected weight change.  HENT: Negative for hearing loss, congestion and neck stiffness.   Eyes: Negative for photophobia, pain and visual disturbance.  Respiratory: Negative for cough, chest tightness, shortness of breath and wheezing.   Cardiovascular: Negative for chest pain and palpitations.  Gastrointestinal: Negative for abdominal  pain, blood in stool and anal bleeding.  Genitourinary: Negative for dysuria, hematuria and difficulty urinating.  Musculoskeletal: Negative for joint swelling.  Neurological: Negative for dizziness, syncope, speech difficulty, weakness, numbness and headaches.      Objective:   Physical Exam Vital signs reviewed GEN: No apparent distress.  Alert and oriented x 3.  Pleasant, conversant, and cooperative to exam. HEENT: head is autraumatic and normocephalic.  Neck is supple without palpable masses or lymphadenopathy.  No JVD or carotid bruits.  Vision intact.  EOMI.  PERRLA.  Sclerae anicteric.  Conjunctivae without pallor or injection. Mucous membranes are moist.  Oropharynx is without erythema, exudates, or other abnormal lesions.  Dentition is good. RESP:  Lungs are clear to ascultation bilaterally with good air movement.  No wheezes, ronchi, or rubs. CARDIOVASCULAR: regular rate, normal rhythm.  Clear S1, S2, no murmurs, gallops, or rubs. ABDOMEN: soft, non-tender, non-distended.  Bowels sounds present in all quadrants and normoactive.  No palpable masses. EXT: warm and dry.  Peripheral pulses equal, intact, and +2 globally.  No clubbing or cyanosis.  No edema in bilateral lower extremities. SKIN: warm and dry with normal turgor.  No rashes or abnormal lesions observed. NEURO: CN II-XII grossly intact.  Muscle strength +5/5 in bilateral upper and lower extremities.  Sensation is grossly intact.  No focal deficit.     Assessment/Plan:

## 2011-04-10 NOTE — Assessment & Plan Note (Signed)
Patient reports weight gain that is likely the result of poor appetite and decrease physical activity.  WIll check TSH today

## 2011-04-10 NOTE — Assessment & Plan Note (Signed)
Pt not currently taking a statin.  Will check lipid profile and LFTs today

## 2011-04-11 LAB — COMPREHENSIVE METABOLIC PANEL
CO2: 25 mEq/L (ref 19–32)
Creat: 0.68 mg/dL (ref 0.50–1.10)
Glucose, Bld: 86 mg/dL (ref 70–99)
Total Bilirubin: 0.5 mg/dL (ref 0.3–1.2)

## 2011-04-11 LAB — LIPID PANEL
Cholesterol: 244 mg/dL — ABNORMAL HIGH (ref 0–200)
HDL: 52 mg/dL (ref >39)
Total CHOL/HDL Ratio: 4.7 Ratio
Triglycerides: 171 mg/dL — ABNORMAL HIGH (ref <150)
VLDL: 34 mg/dL (ref 0–40)

## 2011-04-11 LAB — CBC
MCH: 29.5 pg (ref 26.0–34.0)
MCHC: 32.7 g/dL (ref 30.0–36.0)
MCV: 90.3 fL (ref 78.0–100.0)
Platelets: 345 10*3/uL (ref 150–400)
RDW: 13 % (ref 11.5–15.5)

## 2011-04-19 ENCOUNTER — Encounter: Payer: Self-pay | Admitting: Internal Medicine

## 2011-04-19 ENCOUNTER — Ambulatory Visit (INDEPENDENT_AMBULATORY_CARE_PROVIDER_SITE_OTHER): Payer: Medicare Other | Admitting: Internal Medicine

## 2011-04-19 VITALS — BP 144/82 | HR 87 | Temp 96.6°F | Resp 20 | Ht 60.0 in | Wt 168.8 lb

## 2011-04-19 DIAGNOSIS — K5730 Diverticulosis of large intestine without perforation or abscess without bleeding: Secondary | ICD-10-CM

## 2011-04-19 DIAGNOSIS — K5790 Diverticulosis of intestine, part unspecified, without perforation or abscess without bleeding: Secondary | ICD-10-CM

## 2011-04-19 DIAGNOSIS — Z124 Encounter for screening for malignant neoplasm of cervix: Secondary | ICD-10-CM | POA: Insufficient documentation

## 2011-04-19 DIAGNOSIS — Z1239 Encounter for other screening for malignant neoplasm of breast: Secondary | ICD-10-CM

## 2011-04-19 DIAGNOSIS — B9689 Other specified bacterial agents as the cause of diseases classified elsewhere: Secondary | ICD-10-CM | POA: Insufficient documentation

## 2011-04-19 DIAGNOSIS — Z1231 Encounter for screening mammogram for malignant neoplasm of breast: Secondary | ICD-10-CM

## 2011-04-19 DIAGNOSIS — J019 Acute sinusitis, unspecified: Secondary | ICD-10-CM

## 2011-04-19 HISTORY — DX: Diverticulosis of intestine, part unspecified, without perforation or abscess without bleeding: K57.90

## 2011-04-19 MED ORDER — DOXYCYCLINE HYCLATE 100 MG PO TABS: 100.0000 mg | ORAL_TABLET | Freq: Two times a day (BID) | ORAL | Status: AC

## 2011-04-19 NOTE — Assessment & Plan Note (Signed)
Patient is due for routine pelvic exam with Pap smear. Will have her return in May for this

## 2011-04-19 NOTE — Assessment & Plan Note (Signed)
Patient has had more than 14 days of increased sinus congestion, purulent sinus drainage, and postnasal drip. These symptoms are consistent with acute bacterial sinus infection. At this point, I believe that an empiric course of antibiotics is indicated. We'll treat her with a seven-day course of doxycycline. Patient is to return to clinic if she develops any fevers, shaking chills, cough, shortness of breath, chest pain, or other concerning symptoms.

## 2011-04-19 NOTE — Progress Notes (Signed)
Addended by: Neomia Dear on: 04/19/2011 04:46 PM   Modules accepted: Orders

## 2011-04-19 NOTE — Patient Instructions (Signed)
Schedule followup appointment with Dr. Arvilla Market in May for your pelvic exam and Pap smear. Will refer you for a screening mammogram today. Doxycycline as an antibiotic for treatment of bacterial sinusitis.  Use as directed. Try not to miss any pills and be sure to complete the full 7 days of therapy and if you are feeling better for the first couple of days. Keep taking other medicines as directed.

## 2011-04-19 NOTE — Assessment & Plan Note (Signed)
Will refer patient for screening mammography today.

## 2011-04-19 NOTE — Progress Notes (Signed)
Patient ID: [name], female   DOB: 02-04-1950, 61 y.o.   MRN: [id number]  Subjective:    HPI Patient is a 61 y/o F presenting for two-week followup to discuss lab results and repeat blood pressure check.  Patient states she is feeling well today.  Dysphagia/GERD: pt reports her sx of intermittent dysphagia are well controlled with daily prilosec.  She denies any abdominal pain, regurgitation of food, vomiting, diarrhea, BRBPR, or dark tarry stools.    BP elevated on arrival today.  Pt states she was rushed getting here and she believes this is causing her BP to be elevated.  Patient reports a persistent sensation of ears popping and sinus congestion.  She admits to frontal headache that is worse on the right.   She has been taking advil with minimal relief of symptoms.  She denies fevers, chills, sore throat, and cough.  She reports increased post-nasal drip.  She denies ear pain or ear drainage.  She has experienced these symptoms for 2-3 weeks.  She reports increased purulent nasal drainage that is non-bloody.  Review of Systems Constitutional: Negative for fever, chills, diaphoresis, activity change, appetite change, fatigue and unexpected weight change.  HENT: Negative for hearing loss, congestion and neck stiffness.   Eyes: Negative for photophobia, pain and visual disturbance.  Respiratory: Negative for cough, chest tightness, shortness of breath and wheezing.   Cardiovascular: Negative for chest pain and palpitations.  Gastrointestinal: Negative for abdominal pain, blood in stool and anal bleeding.  Genitourinary: Negative for dysuria, hematuria and difficulty urinating.  Musculoskeletal: Negative for joint swelling.  Neurological: Negative for dizziness, syncope, speech difficulty, weakness, numbness and headaches.      Objective:   Physical Exam Vital signs reviewed GEN: No apparent distress.  Alert and oriented x 3.  Pleasant, conversant, and cooperative to  exam. HEENT: head is autraumatic and normocephalic.  Neck is supple without palpable masses or lymphadenopathy.  No JVD or carotid bruits.  Vision intact.  EOMI.  PERRLA.  Sclerae anicteric.  Conjunctivae without pallor or injection. Mucous membranes are moist.  Oropharynx is without erythema, exudates, or other abnormal lesions.  Dentition is good. RESP:  Lungs are clear to ascultation bilaterally with good air movement.  No wheezes, ronchi, or rubs. CARDIOVASCULAR: regular rate, normal rhythm.  Clear S1, S2, no murmurs, gallops, or rubs. ABDOMEN: soft, non-tender, non-distended.  Bowels sounds present in all quadrants and normoactive.  No palpable masses. EXT: warm and dry.  Peripheral pulses equal, intact, and +2 globally.  No clubbing or cyanosis.  No edema in bilateral lower extremities. SKIN: warm and dry with normal turgor.  No rashes or abnormal lesions observed. NEURO: CN II-XII grossly intact.  Muscle strength +5/5 in bilateral upper and lower extremities.  Sensation is grossly intact.  No focal deficit.     Assessment/Plan:

## 2011-04-20 ENCOUNTER — Encounter: Payer: Medicare Other | Admitting: Internal Medicine

## 2011-05-05 ENCOUNTER — Other Ambulatory Visit: Payer: Self-pay | Admitting: *Deleted

## 2011-05-05 DIAGNOSIS — F419 Anxiety disorder, unspecified: Secondary | ICD-10-CM

## 2011-05-05 DIAGNOSIS — F329 Major depressive disorder, single episode, unspecified: Secondary | ICD-10-CM

## 2011-05-05 NOTE — Telephone Encounter (Signed)
I believe I also wrote her a script on 04/10/11.  If she also got a refill from [facility], then she should have enough for two months.  Would you please call and confirm this with her pharmacy?  Thank you!

## 2011-05-05 NOTE — Telephone Encounter (Signed)
Pharmacy - [facility] states pt got # 90 04/06/11 from a doctor in [place] [address]. Pt states Urgent Care.

## 2011-05-06 ENCOUNTER — Telehealth: Payer: Self-pay | Admitting: Internal Medicine

## 2011-05-06 NOTE — Telephone Encounter (Signed)
Called by [name].  She states that she had seen Dr. Arvilla Market in March and at the time had been on medication for depression and anxiety including lexapro, Wellbutrin, and Xanax.  She has been taking the Xanax 0.5 mg tablets 2-3 times daily and will be running out of her medication tomorrow.  She states that when she saw Dr. Arvilla Market that she said that she was going to write out a prescription for her and that she never got the prescription on 3/14.    I reviewed the records and it does appear that Dr. Arvilla Market did refill her Xanax on 3/14 but on review of the [place] narcotic database the last medication refill for [name]'s Xanax was on 3/14 with a prescription written by Dr. Gerri Lins in [place].  I informed [name] of the policy of not filling controlled substances on a weekend or after hours and that I would send a note to Dr. Arvilla Market and she would decide if she was comfortable refilling the prescription.

## 2011-05-08 MED ORDER — ALPRAZOLAM 0.5 MG PO TABS: 0.5000 mg | ORAL_TABLET | Freq: Three times a day (TID) | ORAL | Status: DC | PRN

## 2011-05-08 NOTE — Telephone Encounter (Signed)
Please call in new rx for #90 with no refills

## 2011-05-09 NOTE — Telephone Encounter (Signed)
Pt called again about refill on her xanax Rx.  She is out of meds and has been out for a day. I called [facility] to review med refill and they last refilled xanax on 3/14 for # 90.  Pt is due for next Rx that was written on 4/12.  [facility] has not received this Rx so I called it in.  Pt informed.  Will run another Data Base to put in pt's chart.

## 2011-05-15 ENCOUNTER — Ambulatory Visit (HOSPITAL_COMMUNITY)
Admission: RE | Admit: 2011-05-15 | Discharge: 2011-05-15 | Disposition: A | Discharge status: Home / Self Care | Payer: Medicare Other | Source: Ambulatory Visit | Attending: Internal Medicine | Admitting: Internal Medicine

## 2011-05-15 DIAGNOSIS — Z1231 Encounter for screening mammogram for malignant neoplasm of breast: Secondary | ICD-10-CM | POA: Insufficient documentation

## 2011-05-17 NOTE — Telephone Encounter (Signed)
Pharmacy has Rx from 05/09/11.

## 2011-05-17 NOTE — Telephone Encounter (Signed)
Pharmacy states called in 05/09/11.

## 2011-05-17 NOTE — Telephone Encounter (Signed)
Checked with pharmacy - pt picked up Rx.

## 2011-05-24 ENCOUNTER — Encounter: Payer: Self-pay | Admitting: Internal Medicine

## 2011-05-24 ENCOUNTER — Ambulatory Visit (INDEPENDENT_AMBULATORY_CARE_PROVIDER_SITE_OTHER): Payer: Medicare Other | Admitting: Internal Medicine

## 2011-05-24 ENCOUNTER — Other Ambulatory Visit (HOSPITAL_COMMUNITY)
Admission: RE | Admit: 2011-05-24 | Discharge: 2011-05-24 | Disposition: A | Discharge status: Home / Self Care | Payer: Medicare Other | Source: Ambulatory Visit | Attending: Internal Medicine | Admitting: Internal Medicine

## 2011-05-24 VITALS — BP 133/80 | HR 80 | Temp 99.0°F | Resp 20 | Ht 61.0 in | Wt 167.3 lb

## 2011-05-24 DIAGNOSIS — N760 Acute vaginitis: Secondary | ICD-10-CM | POA: Insufficient documentation

## 2011-05-24 DIAGNOSIS — Z124 Encounter for screening for malignant neoplasm of cervix: Secondary | ICD-10-CM

## 2011-05-24 DIAGNOSIS — F411 Generalized anxiety disorder: Secondary | ICD-10-CM

## 2011-05-24 DIAGNOSIS — Z1239 Encounter for other screening for malignant neoplasm of breast: Secondary | ICD-10-CM

## 2011-05-24 DIAGNOSIS — F419 Anxiety disorder, unspecified: Secondary | ICD-10-CM

## 2011-05-24 DIAGNOSIS — F329 Major depressive disorder, single episode, unspecified: Secondary | ICD-10-CM

## 2011-05-24 MED ORDER — ALPRAZOLAM 0.5 MG PO TABS: 0.5000 mg | ORAL_TABLET | Freq: Three times a day (TID) | ORAL | Status: DC | PRN

## 2011-05-24 NOTE — Patient Instructions (Signed)
Schedule a follow up appointment with Dr. Arvilla Market as needed. Keep taking your medications as directed. I will call you if any of your results are abnormal. Call the clinic at [phone] with any concerns or questions.

## 2011-05-24 NOTE — Progress Notes (Signed)
Patient ID: [name], female   DOB: November 22, 1950, 61 y.o.   MRN: [id number] Subjective:     HPI: Patient is a 61 year old female today here for routine pelvic exam and Pap smear. She has no other concerns or complaints. She denies abnormal vaginal bleeding, vaginal discharge, vaginal lesions, pelvic pain, fever, or chills.   Review of Systems Constitutional: Negative for fever, chills, diaphoresis, activity change, appetite change, fatigue and unexpected weight change.  HENT: Negative for hearing loss, congestion and neck stiffness.   Eyes: Negative for photophobia, pain and visual disturbance.  Respiratory: Negative for cough, chest tightness, shortness of breath and wheezing.   Cardiovascular: Negative for chest pain and palpitations.  Gastrointestinal: Negative for abdominal pain, blood in stool and anal bleeding.  Genitourinary: Negative for dysuria, hematuria and difficulty urinating.  Musculoskeletal: Negative for joint swelling.  Neurological: Negative for dizziness, syncope, speech difficulty, weakness, numbness and headaches. ]     Objective:   Physical Exam VItal signs reviewed and stable. GEN: No apparent distress.  Alert and oriented x 3.  Pleasant, conversant, and cooperative to exam. GU: no inguinal lymphadenopathy noted. Introitus appears healthy and within normal limits. No atrophy noted. No abnormal lesions noted at introitus on the labia majora, labia minora, or vaginal. Cervical os appears within normal limits; no friability or cervical motion tenderness noted.  No abnormal drainage from cervical os.  No palpable ovarian/uterine masses.  Visualization of the endocervix is within normal limits.  Assessment/Plan:

## 2011-05-25 NOTE — Assessment & Plan Note (Signed)
Pelvic exam performed today; no abnormalities noted on exam. Will send cervical specimen for wet prep and Pap cytology.  Will notify patient of any abnormal results.

## 2011-05-25 NOTE — Assessment & Plan Note (Signed)
Patient is up-to-date on screening mammography; performed in April 2013 revealed BI-RADS 1 with recommendations for repeat mammogram in one year.

## 2011-07-03 ENCOUNTER — Other Ambulatory Visit: Payer: Self-pay | Admitting: *Deleted

## 2011-07-03 DIAGNOSIS — F419 Anxiety disorder, unspecified: Secondary | ICD-10-CM

## 2011-07-03 DIAGNOSIS — F329 Major depressive disorder, single episode, unspecified: Secondary | ICD-10-CM

## 2011-07-04 MED ORDER — OMEPRAZOLE 20 MG PO CPDR: 20.0000 mg | DELAYED_RELEASE_CAPSULE | Freq: Every day | ORAL | Status: DC

## 2011-07-26 ENCOUNTER — Telehealth: Payer: Self-pay | Admitting: *Deleted

## 2011-07-26 NOTE — Telephone Encounter (Signed)
Pt is transferred from front office, she states she is in desperate circumstances, states she has had her disability cut, does not have enough money to go around, her grchildren are coming for 2 weeks and she cannot afford to feed and entertain them and they are very spoiled so she cannot tell them that she is having a bad time of it. May need new medicine??? i suggested calling or going to [facility] and she states she does not want to tell "those people" what's going on, again mentions she may need new medicine. She then says she will just take the kids to friends houses that have pools and things and she will come in after 7/22, she then hangs up. i ask the front office to call her back and schedule an appt, she refused until after the 22nd.

## 2011-08-14 ENCOUNTER — Ambulatory Visit (INDEPENDENT_AMBULATORY_CARE_PROVIDER_SITE_OTHER): Payer: Medicare Other | Admitting: Internal Medicine

## 2011-08-14 ENCOUNTER — Encounter: Payer: Self-pay | Admitting: Internal Medicine

## 2011-08-14 VITALS — BP 140/70 | HR 80 | Temp 97.8°F | Ht 61.0 in | Wt 165.9 lb

## 2011-08-14 DIAGNOSIS — R635 Abnormal weight gain: Secondary | ICD-10-CM

## 2011-08-14 DIAGNOSIS — F411 Generalized anxiety disorder: Secondary | ICD-10-CM

## 2011-08-14 DIAGNOSIS — F3289 Other specified depressive episodes: Secondary | ICD-10-CM

## 2011-08-14 DIAGNOSIS — E669 Obesity, unspecified: Secondary | ICD-10-CM

## 2011-08-14 DIAGNOSIS — G43119 Migraine with aura, intractable, without status migrainosus: Secondary | ICD-10-CM

## 2011-08-14 DIAGNOSIS — E8881 Metabolic syndrome: Secondary | ICD-10-CM

## 2011-08-14 DIAGNOSIS — Z124 Encounter for screening for malignant neoplasm of cervix: Secondary | ICD-10-CM

## 2011-08-14 DIAGNOSIS — F329 Major depressive disorder, single episode, unspecified: Secondary | ICD-10-CM

## 2011-08-14 DIAGNOSIS — E785 Hyperlipidemia, unspecified: Secondary | ICD-10-CM

## 2011-08-14 DIAGNOSIS — I10 Essential (primary) hypertension: Secondary | ICD-10-CM

## 2011-08-14 DIAGNOSIS — E119 Type 2 diabetes mellitus without complications: Secondary | ICD-10-CM

## 2011-08-14 DIAGNOSIS — G4700 Insomnia, unspecified: Secondary | ICD-10-CM

## 2011-08-14 DIAGNOSIS — E1169 Type 2 diabetes mellitus with other specified complication: Secondary | ICD-10-CM

## 2011-08-14 DIAGNOSIS — Z131 Encounter for screening for diabetes mellitus: Secondary | ICD-10-CM

## 2011-08-14 DIAGNOSIS — F32A Depression, unspecified: Secondary | ICD-10-CM

## 2011-08-14 DIAGNOSIS — Z789 Other specified health status: Secondary | ICD-10-CM

## 2011-08-14 DIAGNOSIS — G43819 Other migraine, intractable, without status migrainosus: Secondary | ICD-10-CM

## 2011-08-14 DIAGNOSIS — F419 Anxiety disorder, unspecified: Secondary | ICD-10-CM

## 2011-08-14 LAB — POCT GLYCOSYLATED HEMOGLOBIN (HGB A1C): Hemoglobin A1C: 5.6

## 2011-08-14 LAB — GLUCOSE, CAPILLARY: Glucose-Capillary: 92 mg/dL (ref 70–99)

## 2011-08-14 MED ORDER — ESCITALOPRAM OXALATE 20 MG PO TABS: 20.0000 mg | ORAL_TABLET | Freq: Every day | ORAL | Status: DC

## 2011-08-14 MED ORDER — BUPROPION HCL ER (XL) 300 MG PO TB24: 300.0000 mg | ORAL_TABLET | ORAL | Status: DC

## 2011-08-14 NOTE — Patient Instructions (Signed)
--  Please come back tomorrow for your labs --Follow up in 6 weeks here is the clinic --Follow up with psychiatrist

## 2011-08-15 ENCOUNTER — Telehealth: Payer: Self-pay | Admitting: *Deleted

## 2011-08-15 NOTE — Telephone Encounter (Signed)
Pt called - unable to come today for labs - will do 08/16/11. Lab aware. Stanton Kidney Braxon Suder RN 08/15/11 1:10PM

## 2011-08-16 NOTE — Progress Notes (Signed)
Agree with plan 

## 2011-08-17 ENCOUNTER — Telehealth: Payer: Self-pay | Admitting: Licensed Clinical Social Worker

## 2011-08-17 NOTE — Telephone Encounter (Signed)
[name] was referred to CSW for psychiatry referral.  Pt has Medicare and would be able to be seen at [facility].  CSW placed called to pt.  CSW left message requesting return call. CSW provided contact hours and phone number.

## 2011-08-18 ENCOUNTER — Telehealth: Payer: Self-pay | Admitting: Internal Medicine

## 2011-08-18 DIAGNOSIS — Z789 Other specified health status: Secondary | ICD-10-CM | POA: Insufficient documentation

## 2011-08-18 HISTORY — DX: Other specified health status: Z78.9

## 2011-08-18 NOTE — Assessment & Plan Note (Signed)
Pt with TIMI risk score of 1, goal LDL <160. Will recheck lipids in 8 months.

## 2011-08-18 NOTE — Assessment & Plan Note (Signed)
Concern for depression.   --Referred to psychiatry

## 2011-08-18 NOTE — Assessment & Plan Note (Signed)
Pt reports being a vegetarian for a long time now and does not take B12 supplements.   --Will check B12 level today

## 2011-08-18 NOTE — Assessment & Plan Note (Signed)
HbA1C today at 5.6, was 5.9 in March. Counseled pt briefly on weight loss, exercise, diet. Pt has lost 5 pounds in last two months but eating less. Will further discuss weight loss goals during next visit. Will consider referral to [facility], diabetes educator.

## 2011-08-18 NOTE — Assessment & Plan Note (Signed)
BP at upper normal today. Currently on no medications. Will reassess during next visit.

## 2011-08-18 NOTE — Assessment & Plan Note (Signed)
Pt on Lexapro 20mg  and buproprion 300mg /day. Referred to psychiatry for formal evaluation.

## 2011-08-18 NOTE — Assessment & Plan Note (Signed)
Pt advised to follow up with psychiatrist referral for formal evaluation. Pt declined therapy/counseling referal at this visit.  Referred to CSW/psychiatry

## 2011-08-18 NOTE — Assessment & Plan Note (Signed)
Pt lost weight since last seen in clinic.

## 2011-08-18 NOTE — Assessment & Plan Note (Signed)
No complaints today.

## 2011-08-18 NOTE — Progress Notes (Addendum)
  Subjective:    Patient ID: [name], female    DOB: [birth year], 61 y.o.   MRN: [id number]  HPI [name], a is 61 year old woman with past medical history significant for hypertension, hyperlipidemia, anxiety, and depression who comes in today for evaluation of sinus pain and headache and follow-up visit for depression. She tells me that she has had mild sinus pain and headache with cheek pain but with no green/purulant nasal discharge for about 1 week now. The symptoms have improved with Advil. She denies fever,  rhinorrhea, nasal congestion, sore throat, cough, nausea, post nasal drip, or ear pain.   She lives alone with her dogs and has been increasingly socially withdrawn. Her son-in-law died last year in 11-21-2022 with colon cancer and since then she reports feeling increasingly more depressed, with excessive guilt, racing thoughts that keep her awake at night, decreased appetite, and insomnia with concerns for hypomanic symptoms as she denies feeling tired the next day. She denies pressured speech, hypersexuality, excessive shopping, hearing voices, or suicidal or homicidal ideation.    Review of Systems  Constitutional: Positive for activity change, appetite change and fatigue. Negative for fever, chills, diaphoresis and unexpected weight change.  Eyes: Negative for discharge.  Respiratory: Negative for cough and chest tightness.   Cardiovascular: Negative for chest pain.  Musculoskeletal: Negative for back pain.  Skin:       Lesion over left flank, which is non pruritic  Neurological: Negative for dizziness and syncope.  Psychiatric/Behavioral: Positive for disturbed wake/sleep cycle. Negative for suicidal ideas, hallucinations, behavioral problems, self-injury and agitation. The patient is nervous/anxious.        Objective:   Physical Exam  Constitutional: She is oriented to person, place, and time. She appears well-developed and well-nourished. No distress.   Conversant with tangential speech  HENT:  Head: Normocephalic and atraumatic.  Right Ear: External ear normal.  Left Ear: External ear normal.  Nose: Nose normal.  Mouth/Throat: Oropharynx is clear and moist. No oropharyngeal exudate.  Eyes: Conjunctivae are normal. Right eye exhibits no discharge. Left eye exhibits no discharge. No scleral icterus.  Cardiovascular: Normal rate and normal heart sounds.   Pulmonary/Chest: Effort normal and breath sounds normal. No respiratory distress. She has no wheezes. She has no rales. She exhibits no tenderness.  Abdominal: Soft.  Musculoskeletal: She exhibits no edema.  Lymphadenopathy:    She has no cervical adenopathy.  Neurological: She is alert and oriented to person, place, and time. She has normal reflexes.  Skin: Skin is warm and dry. No rash noted. She is not diaphoretic. No erythema.       Lesion on left flank likely seborrheic keratosis, non erythematous, ~1cm in diameter.   Psychiatric:       Pt emotional at times, crying, talkative, with occasional tangential speech.           Assessment & Plan:

## 2011-08-18 NOTE — Telephone Encounter (Signed)
Called pt to report that her HbA1C was 5.6, and she did not need to start medications for diabetes. Advised her to follow a healthy diet. She states not eating much lately as she is so busy and has had financial difficulties.   Pt reports continued sinus pain and headache with occasional nasal drainage but with improvement of symptoms while taking Claritin. She was advised to return to clinic if her symptoms persist or worsen.    She was contacted by Lynnae January for psychiatry referral. Pt was again advised to see a psychiatrist for specialized depression, anxiety, and mental health evaluation and management. She said she used to see a psychiatrist here in [place] and will try to make an appointment soon.   She reports that she missed her lab appointment today for her B12 level. Pt will schedule follow up visit with PCP and have B12 level checked at that time.    I explained to her that her Pap smear was unsastifactory and she will need a repeat Pap smear and this can be done here in clinic with her new PCP, Dr. Criselda Peaches. She will call and make a follow up appointment.

## 2011-08-18 NOTE — Telephone Encounter (Signed)
CSW received return call from [name].  CSW had lengthy coversation with [name].  Discussed referral to a psychiatrist as recommended during her [facility] visit.  Pt states she has no need to see a psychiatrist. [name] states she is not "BiPolar" and is only "depressed", pt states she still has been dealing with the death of son-in-law, who passed of [place] several months ago. [name] states she has been dealing with her depression "for years" and has been on different medications, when off her anti-depressants pt "hibernates".  But, once pt restarts her meds "I'm fine".  [name] expressed her dogs give her purpose in life and "help get me up in the morning", "counseling only makes me cry".  Pt is in agreement to see a psychiatrist for med mgmt if her PCP will no longer prescribe her medications, but wants to establish care with her assigned PCP to discuss med mgmt. Pt voiced concern regarding potential dx of Diabetes.  Pt reports not eating appropriately due to:  having her young grandchildren visiting for 2 weeks, ages 83 y/o and 63 y/o.  Pt states she was not use to having children and caring for them was exhausting.  [name] admits to not eating healthy due to being busy.  [name] states she often goes without eating, and then will eat a bag of Twizzlers.  Pt states she is trying to change her eating habits. Pt states she is "not diabetic" and did not want to return to [facility] for further labs.  CSW encouraged pt to return to lab for ordered labs and made patient aware of the RD, [facility] has on staff.  [name] states hearing the dx of bipolar and diabetes during her exam was a bit overwhelming and resulted in the need for her to take an extra Xanax.  Pt concerned she will run out of Xanax because she took one pill too many.  CSW encouraged [name] to return to or call office a week or two prior to needing a refill.   CSW offered emotional support and encouragement.  Pt aware CSW is  available to assist with referrals when pt is ready to receive the care.

## 2011-08-18 NOTE — Assessment & Plan Note (Signed)
Last Pap smear with insufficient amount of cells. Pt needs a repeat Pap smear but declined today. Pt concerned for STD, shared recent cytology report as negative. Pt reports hx of unprotected sex, though not clear on how long ago. Would discuss HIV, GC/Chlamidya, HepC screening during next visit.

## 2011-09-13 ENCOUNTER — Other Ambulatory Visit: Payer: Self-pay | Admitting: *Deleted

## 2011-09-13 DIAGNOSIS — F419 Anxiety disorder, unspecified: Secondary | ICD-10-CM

## 2011-09-13 DIAGNOSIS — F329 Major depressive disorder, single episode, unspecified: Secondary | ICD-10-CM

## 2011-09-13 MED ORDER — OMEPRAZOLE 20 MG PO CPDR: 20.0000 mg | DELAYED_RELEASE_CAPSULE | Freq: Every day | ORAL | Status: DC

## 2011-10-09 ENCOUNTER — Other Ambulatory Visit: Payer: Self-pay | Admitting: *Deleted

## 2011-10-09 DIAGNOSIS — F32A Depression, unspecified: Secondary | ICD-10-CM

## 2011-10-09 DIAGNOSIS — F329 Major depressive disorder, single episode, unspecified: Secondary | ICD-10-CM

## 2011-10-09 DIAGNOSIS — F419 Anxiety disorder, unspecified: Secondary | ICD-10-CM

## 2011-10-09 MED ORDER — ALPRAZOLAM 0.5 MG PO TABS: 0.5000 mg | ORAL_TABLET | Freq: Three times a day (TID) | ORAL | Status: DC | PRN

## 2011-10-09 NOTE — Telephone Encounter (Signed)
Rx called in 

## 2011-10-09 NOTE — Telephone Encounter (Signed)
Pt is out of meds Call when ready @ [phone] Pt has scheduled appointment 10/14

## 2011-11-06 ENCOUNTER — Encounter: Payer: Self-pay | Admitting: Internal Medicine

## 2011-11-06 ENCOUNTER — Ambulatory Visit (INDEPENDENT_AMBULATORY_CARE_PROVIDER_SITE_OTHER): Payer: Medicare Other | Admitting: Internal Medicine

## 2011-11-06 VITALS — BP 138/65 | HR 66 | Temp 98.0°F | Ht 61.0 in | Wt 167.1 lb

## 2011-11-06 DIAGNOSIS — Z0000 Encounter for general adult medical examination without abnormal findings: Secondary | ICD-10-CM | POA: Insufficient documentation

## 2011-11-06 DIAGNOSIS — F419 Anxiety disorder, unspecified: Secondary | ICD-10-CM

## 2011-11-06 DIAGNOSIS — I10 Essential (primary) hypertension: Secondary | ICD-10-CM

## 2011-11-06 DIAGNOSIS — F411 Generalized anxiety disorder: Secondary | ICD-10-CM

## 2011-11-06 DIAGNOSIS — E785 Hyperlipidemia, unspecified: Secondary | ICD-10-CM

## 2011-11-06 DIAGNOSIS — J309 Allergic rhinitis, unspecified: Secondary | ICD-10-CM

## 2011-11-06 DIAGNOSIS — F329 Major depressive disorder, single episode, unspecified: Secondary | ICD-10-CM

## 2011-11-06 DIAGNOSIS — E538 Deficiency of other specified B group vitamins: Secondary | ICD-10-CM | POA: Insufficient documentation

## 2011-11-06 DIAGNOSIS — J302 Other seasonal allergic rhinitis: Secondary | ICD-10-CM

## 2011-11-06 DIAGNOSIS — G4700 Insomnia, unspecified: Secondary | ICD-10-CM

## 2011-11-06 DIAGNOSIS — Z23 Encounter for immunization: Secondary | ICD-10-CM

## 2011-11-06 HISTORY — DX: Encounter for general adult medical examination without abnormal findings: Z00.00

## 2011-11-06 MED ORDER — ALPRAZOLAM 0.5 MG PO TABS: 0.5000 mg | ORAL_TABLET | Freq: Three times a day (TID) | ORAL | Status: DC | PRN

## 2011-11-06 MED ORDER — AZELASTINE HCL 0.1 % NA SOLN: 2.0000 | Freq: Two times a day (BID) | NASAL | Status: DC

## 2011-11-06 NOTE — Assessment & Plan Note (Signed)
Colonoscopy: 2010 - diverticulosis, no polyps DEXA - reported as 2008, will need to obtain report MMG - 04/2011, negative PAP - 05/2011, inadequate, repeat at next visit.   Flu: Today Pneumovax at 61yo Will discuss tetanus booster at next visit.

## 2011-11-06 NOTE — Assessment & Plan Note (Signed)
BP is 138/65, which is at goal off of therapy.  Monitor closely

## 2011-11-06 NOTE — Patient Instructions (Addendum)
Please schedule a follow up visit within the next 4-5 months.   For your medications:   Please bring all of your pill  Bottles with you to each visit.  This will help make sure that we have an up to date list of all the medications you are taking.  Please also bring any over the counter herbal medications you are taking (not including advil, tylenol, etc.)  Please continue taking all of your medications.  Your Xanax will be increased to 120 pills a month.  Please start taking 2 pills at bedtime to help with insomnia.  You will also have an Rx for Astelin nasal spray sent to [facility] for your nasal drainage.  Please let me know if you cannot afford this medication or if it is ineffective.   At your next visit, you will have a pelvic/pap smear and we will discuss living wills and HealthCare Power of [place].  Please read through the information given prior to the visit!  Thank you!

## 2011-11-06 NOTE — Progress Notes (Signed)
Subjective:    Patient ID: [name], female    DOB: December 03, 1950, 61 y.o.   MRN: [id number]  Follow up, establish with new pcp.   HPI  [name] is a 61yo woman who presents for follow up of her chronic medical issues.  [name] reports that she recently (within the last year) moved back from [place] where she had gone to care for her grandkids because her son-in-law had been diagnosed with cancer.  She moved back after he passed away.  She was diagnosed with "walking pnuemonia" in [place].  Since her pneumonia improved, she has been having continuous nasal, greenish drainage from the right nostril only.  She has congestion and sinus pressure on the right.  She believes that it is draining into the back of her throat.  She has no fever, chills, sore throat, voice change.    [name] also spoke extensively about her living situation and concerns about life in general.  She has many anxieties including worry over the economy, government, not having work, dying young, developing parkinson's disease or dementia (both of which her father had).  She has also had recent stressors including the death of her son-in-law and the death of two beloved dogs (Greyhounds).  She is concerned that if she were to become incapacitated or develop dementia, that she would "end up in a home" like her father.  She also has a strained relationship with her daughter who has remained in [place] after her husband's passing.  She takes Lexapro and Xanax as well as wellbutrin for her mood.  These do seem to help and she has a worsening of her symptoms when she misses doses.   She also complains of insomnia, reporting only a few hours of sleep daily secondary to her worries as noted above.  She denies current thoughts of hurting herself or others.  She notes that she wouldn't want to leave her dogs alone, or "do that" to her grandkids.   She also expresses concern about her living situation.  She lives in  a rental with possible mold and a leaking roof.  She thinks these issues may explain her persistent congestion.  She has complained to her landlord, but without effect.  She reports also having no money left over from monthly bills for food and having inadequate income since moving back to [place].  She is not interested in being referred to a Child psychotherapist for assistance as she "does not like to ask for help."  She has been diagnosed with HTN, BP today is at goal.  She also carries the diagnosis of HLD, last LDL was 158.  She is currently not taking any medication.  Further issues include her diet, which is mainly vegetarian.   Allergies: Codeine - nausea; PCN - Rash; Cephalexin - Rash  PFSH: PMH and Surgical history is updated in Epic, along with family history.  She currently lives alone in a rental house as noted above.  She works at a [employer], corporate and has some social outlet there.  She has two remaining Greyhound dogs which she cares for.  She denies smoking, ETOH or illicit drugs.   Medications: Updated in epic, include lexapro, xanax, omeprazole, buproprion.   Review of Systems  Constitutional: Negative for fever, chills and appetite change.  HENT: Positive for hearing loss, congestion, rhinorrhea and postnasal drip. Negative for nosebleeds, sore throat and trouble swallowing.   Eyes: Negative for photophobia and visual disturbance.  Respiratory: Negative for cough, chest  tightness and shortness of breath.   Cardiovascular: Negative for chest pain and leg swelling.  Gastrointestinal: Negative for abdominal pain, diarrhea and constipation.  Genitourinary: Negative for dysuria, hematuria and difficulty urinating.  Musculoskeletal: Positive for back pain and arthralgias. Negative for joint swelling and gait problem.  Skin: Negative for pallor and rash.  Neurological: Positive for numbness and headaches. Negative for dizziness, weakness and light-headedness.       Hand numbness when  reading/typing   Hematological: Negative for adenopathy. Does not bruise/bleed easily.  Psychiatric/Behavioral: Positive for disturbed wake/sleep cycle and dysphoric mood. Negative for suicidal ideas, hallucinations and self-injury. The patient is nervous/anxious.        Objective:   Physical Exam  Constitutional: She is oriented to person, place, and time. She appears well-developed and well-nourished. No distress.  HENT:  Head: Normocephalic and atraumatic.  Mouth/Throat: Oropharynx is clear and moist.  Eyes: Conjunctivae normal and EOM are normal. Pupils are equal, round, and reactive to light. Right eye exhibits no discharge. Left eye exhibits no discharge. No scleral icterus.  Neck: Neck supple.  Cardiovascular: Normal rate, regular rhythm and normal heart sounds.   Pulmonary/Chest: Effort normal and breath sounds normal.  Abdominal: Bowel sounds are normal. There is no tenderness.  Musculoskeletal: She exhibits no edema and no tenderness.  Lymphadenopathy:    She has no cervical adenopathy.  Neurological: She is alert and oriented to person, place, and time.  Skin: Skin is warm and dry. No rash noted. She is not diaphoretic.  Psychiatric: Her mood appears anxious. Her affect is blunt. Her affect is not angry and not inappropriate. She is not agitated, not aggressive, not slowed and not actively hallucinating. She expresses no homicidal and no suicidal ideation. She expresses no suicidal plans and no homicidal plans.    B12 check today due to patient diet, concern for deficiency.      Assessment & Plan:

## 2011-11-06 NOTE — Progress Notes (Signed)
Alprazolam 0.5mg  rx called to [facility] on [address].

## 2011-11-06 NOTE — Assessment & Plan Note (Signed)
LDL is 158 on 3/13, not on treatment.  Advised re: diet changes.  She likely needs to be < 130 with concomitant HTN.  Will discuss further at future visit. Likely need to start pravastatin.  Check panel at next visit.

## 2011-11-06 NOTE — Assessment & Plan Note (Signed)
Noted above in Anxiety problem.   Increase Xanax to 1mg  at night, increase Rx to 120 pills/month of 0.5mg .  She takes one in the AM, occasionally requires one in the daytime, and 2 tabs at night.

## 2011-11-06 NOTE — Assessment & Plan Note (Signed)
Ms. Stegmaier likely has generalized anxiety disorder.  During our time together today, she expressed concerns ranging from the economy to her rental home to her family to her own medical problems.  Will assess with the GAD-7 scoring tool at next visit. She is not interested in psychotherapy at this time, as she has "done that before" and it didn't help.  She is currently on an SSRI, Lexapro, and a BZD, Xanax. The lexapro is at a max dose.  She also has concomitant insomnia due to worry.  Will plan to increase her Xanax to 1mg  at bedtime and assess for improvement.  Ms. Guin would likely benefit from psychotherapy, however, she is not interested in referral at this time.    I discussed at length her stressors, one of which being about what would happen to her if she should become incapacitated.  We discussed living wills and designating a HCPOA.  She is concerned that her daughter would not make decisions for her within her wishes because they have a strained relationship.  I provided her with an information packet and advised her to read it thoroughly, decide on who to designate as a HCPOA, discuss this with that person and then we would talk about it at her next visit. This did seem to help with some of her anxiety regarding the future.

## 2011-11-06 NOTE — Assessment & Plan Note (Signed)
Concern for, will check B12 level today

## 2011-11-07 ENCOUNTER — Telehealth: Payer: Self-pay | Admitting: *Deleted

## 2011-11-07 NOTE — Telephone Encounter (Signed)
Pt was seen in clinic yesterday 10/14 Pt called in today asking about a note she had asked about and thinks you may have forgotten to write it. Pt states the apartment she lives in has a severe mold problem.  The bathroom wall is covered with mold and roof leaks.   She c/o nasal congestion , headaches and productive cough with nasal drainage. She is asking for a letter be written and addressed to  " To Whom It May Concern ".  This is for her landlady.   She wants it to state the mold is causing her above symptoms.  Pt # [id number]

## 2011-11-08 ENCOUNTER — Encounter: Payer: Self-pay | Admitting: Internal Medicine

## 2011-11-15 NOTE — Telephone Encounter (Signed)
Letter has been written. 

## 2011-12-01 ENCOUNTER — Other Ambulatory Visit: Payer: Self-pay | Admitting: *Deleted

## 2011-12-01 DIAGNOSIS — F419 Anxiety disorder, unspecified: Secondary | ICD-10-CM

## 2011-12-01 DIAGNOSIS — F329 Major depressive disorder, single episode, unspecified: Secondary | ICD-10-CM

## 2011-12-01 MED ORDER — ESCITALOPRAM OXALATE 20 MG PO TABS: 20.0000 mg | ORAL_TABLET | Freq: Every day | ORAL | Status: DC

## 2011-12-01 MED ORDER — BUPROPION HCL ER (XL) 300 MG PO TB24: 300.0000 mg | ORAL_TABLET | ORAL | Status: DC

## 2011-12-01 NOTE — Telephone Encounter (Signed)
Pt has scheduled appointment 12/2

## 2011-12-25 ENCOUNTER — Ambulatory Visit: Payer: Medicare Other | Admitting: Internal Medicine

## 2012-01-05 ENCOUNTER — Telehealth: Payer: Self-pay | Admitting: *Deleted

## 2012-01-05 NOTE — Telephone Encounter (Signed)
Pt scheduled for Monday at 3:15. Dr Criselda Peaches spoke with pt and advised her.

## 2012-01-05 NOTE — Telephone Encounter (Signed)
Pt called stating she has had chest Pain for 3 nights. This only happens at night.  No changes with medications. She denies nausea, diaphoresis, or SOB She states she had tightness to right side of chest with radiation to shoulder. She feels it may be indigestion, she took  Prilosec with out relief. Pain lasted a few hours then passed.    Pt # [id number]

## 2012-01-05 NOTE — Telephone Encounter (Signed)
Called and spoke with [name], confirmed correct patient by birth date review.   She confirmed chest pain/epigastric pain (pain in my stomach under my ribs) x 3 days at night.  She said the pain felt like someone was sitting on her chest and it radiated to the right shoulder.  She has insomnia and these symptoms came on at night.  She tried prilosec without relief.  She did not have a change in medications. She denied nausea, vomiting, heartburn, SOB, further radiation of the pain, worsening with exertion or movement, worse with lying down, recent illness.  The pain passed on its own.  She has reported history of HTN, but has never been on medications for it, the same for HLD.  She has no history of heart disease.  She has not had the symptoms for the last 2 evenings.  As the chest pain is not current, and seems to be atypical in nature, I advised her to come in for a clinic appointment on Monday or Tuesday at a time convenient for her to be evaluated by EKG and blood work.  I also advised her that if the pain returns or worsens to present to the ED.  [name] expressed understanding.

## 2012-01-08 ENCOUNTER — Encounter: Payer: Self-pay | Admitting: Internal Medicine

## 2012-01-08 ENCOUNTER — Ambulatory Visit (INDEPENDENT_AMBULATORY_CARE_PROVIDER_SITE_OTHER): Payer: Medicare Other | Admitting: Internal Medicine

## 2012-01-08 VITALS — BP 140/74 | HR 84 | Temp 97.6°F | Ht 60.5 in | Wt 172.7 lb

## 2012-01-08 DIAGNOSIS — F329 Major depressive disorder, single episode, unspecified: Secondary | ICD-10-CM

## 2012-01-08 DIAGNOSIS — R0789 Other chest pain: Secondary | ICD-10-CM

## 2012-01-08 DIAGNOSIS — F419 Anxiety disorder, unspecified: Secondary | ICD-10-CM

## 2012-01-08 DIAGNOSIS — F411 Generalized anxiety disorder: Secondary | ICD-10-CM

## 2012-01-08 DIAGNOSIS — R079 Chest pain, unspecified: Secondary | ICD-10-CM

## 2012-01-08 LAB — LIPID PANEL
HDL: 42 mg/dL (ref >39)
Total CHOL/HDL Ratio: 5.6 Ratio
VLDL: 54 mg/dL — ABNORMAL HIGH (ref 0–40)

## 2012-01-08 MED ORDER — BUPROPION HCL ER (XL) 300 MG PO TB24: 300.0000 mg | ORAL_TABLET | ORAL | Status: DC

## 2012-01-08 NOTE — Addendum Note (Signed)
Addended by: Debe Coder B on: 01/08/2012 05:05 PM   Modules accepted: Orders

## 2012-01-08 NOTE — Patient Instructions (Addendum)
If your chest pain recurs, please go to Urgent Care or the Emergency Room to be assessed. We will check your cholesterol level today.  The results will be discussed at your follow-up appointment in January with your Primary Doctor.

## 2012-01-08 NOTE — Assessment & Plan Note (Addendum)
Reports chest pain after taking benadryl and claritin D a week ago.  Now resolved, refusing EKG today.  Will check lipid panel for cardiac risk stratification.

## 2012-01-08 NOTE — Progress Notes (Signed)
  Subjective:    Patient ID: [name], female    DOB: 10-03-1950, 61 y.o.   MRN: [id number]  HPI  Pt presents for evaluation of chest pain that she had last week after taking benadryl and Claritin D.  States that her chest and face felt full.  Pain lasted minutes and has fully resolved.  Advised via phone to go to Urgent care or ED if recurs and f/u in clinic this week for EKG and blood work.  Today pt states that she has had no further chest pain.  Refusing EKG but will allow lipid panel. Hx significant for hyperlipidemia, anxiety and depression, migraines, and insomnia.  Review of Systems As per HPI    Objective:   Physical Exam  Constitutional: She is oriented to person, place, and time. She appears well-developed and well-nourished. No distress.  Cardiovascular: Normal rate, regular rhythm, normal heart sounds and intact distal pulses.   Pulmonary/Chest: Effort normal. She has no wheezes. She has no rales.  Abdominal: Soft. Bowel sounds are normal.  Neurological: She is alert and oriented to person, place, and time.  Skin: Skin is warm and dry.  Psychiatric: She has a normal mood and affect.          Assessment & Plan:  1. Chest pain: resolved, likely related to anxiety but pt with cardiac risk factors given age, hypertension and hyperlipidemia.  Decline EKG today. -advised to seek evualation at ED or UC if recurrs -keep scheduled appt with PCP 01/2012

## 2012-01-15 ENCOUNTER — Telehealth: Payer: Self-pay | Admitting: *Deleted

## 2012-01-15 ENCOUNTER — Other Ambulatory Visit: Payer: Self-pay | Admitting: *Deleted

## 2012-01-15 DIAGNOSIS — F419 Anxiety disorder, unspecified: Secondary | ICD-10-CM

## 2012-01-15 DIAGNOSIS — F329 Major depressive disorder, single episode, unspecified: Secondary | ICD-10-CM

## 2012-01-15 MED ORDER — OMEPRAZOLE 20 MG PO CPDR: 20.0000 mg | DELAYED_RELEASE_CAPSULE | Freq: Every day | ORAL | Status: DC

## 2012-01-15 NOTE — Telephone Encounter (Signed)
Patient should be seen and evaluated.  If we have no openings today, would advise urgent care or ED.

## 2012-01-15 NOTE — Telephone Encounter (Signed)
Pt informed

## 2012-01-15 NOTE — Telephone Encounter (Signed)
Pt called with c/o arthritis pain to knees and back. Increase pain to back and knees, pain is throbbing, even hurts to breath. Tried Advil and tylenol with no relief. Also warm compresses. Rates pain 8/10 and is asking for something to help with this pain.  Anything. She has an appointment with Dr Vinie Sill 1/27  Pt # [phone]

## 2012-02-06 NOTE — Addendum Note (Signed)
Addended by: Bufford Spikes on: 02/06/2012 09:24 AM   Modules accepted: Orders

## 2012-02-19 ENCOUNTER — Ambulatory Visit: Payer: Medicare Other

## 2012-02-19 ENCOUNTER — Ambulatory Visit (INDEPENDENT_AMBULATORY_CARE_PROVIDER_SITE_OTHER): Payer: Medicare Other | Admitting: Internal Medicine

## 2012-02-19 ENCOUNTER — Encounter: Payer: Self-pay | Admitting: Internal Medicine

## 2012-02-19 VITALS — BP 106/67 | HR 77 | Temp 97.2°F | Ht 60.5 in | Wt 163.7 lb

## 2012-02-19 DIAGNOSIS — Z0000 Encounter for general adult medical examination without abnormal findings: Secondary | ICD-10-CM

## 2012-02-19 DIAGNOSIS — J302 Other seasonal allergic rhinitis: Secondary | ICD-10-CM

## 2012-02-19 DIAGNOSIS — F419 Anxiety disorder, unspecified: Secondary | ICD-10-CM

## 2012-02-19 DIAGNOSIS — F32A Depression, unspecified: Secondary | ICD-10-CM

## 2012-02-19 DIAGNOSIS — Z598 Other problems related to housing and economic circumstances: Secondary | ICD-10-CM

## 2012-02-19 DIAGNOSIS — J309 Allergic rhinitis, unspecified: Secondary | ICD-10-CM

## 2012-02-19 DIAGNOSIS — Z599 Problem related to housing and economic circumstances, unspecified: Secondary | ICD-10-CM

## 2012-02-19 DIAGNOSIS — I10 Essential (primary) hypertension: Secondary | ICD-10-CM

## 2012-02-19 DIAGNOSIS — F329 Major depressive disorder, single episode, unspecified: Secondary | ICD-10-CM

## 2012-02-19 DIAGNOSIS — E785 Hyperlipidemia, unspecified: Secondary | ICD-10-CM

## 2012-02-19 DIAGNOSIS — F3289 Other specified depressive episodes: Secondary | ICD-10-CM

## 2012-02-19 DIAGNOSIS — F411 Generalized anxiety disorder: Secondary | ICD-10-CM

## 2012-02-19 MED ORDER — ESCITALOPRAM OXALATE 20 MG PO TABS: 20.0000 mg | ORAL_TABLET | Freq: Every day | ORAL | Status: DC

## 2012-02-19 MED ORDER — BUPROPION HCL ER (XL) 300 MG PO TB24: 300.0000 mg | ORAL_TABLET | ORAL | Status: DC

## 2012-02-19 MED ORDER — AZELASTINE HCL 0.1 % NA SOLN: 2.0000 | Freq: Two times a day (BID) | NASAL | Status: DC

## 2012-02-19 MED ORDER — OMEPRAZOLE 20 MG PO CPDR: 20.0000 mg | DELAYED_RELEASE_CAPSULE | Freq: Every day | ORAL | Status: DC

## 2012-02-19 MED ORDER — ALPRAZOLAM 0.5 MG PO TABS: 0.5000 mg | ORAL_TABLET | Freq: Three times a day (TID) | ORAL | Status: DC | PRN

## 2012-02-19 MED ORDER — OMEPRAZOLE 20 MG PO CPDR
20.0000 mg | DELAYED_RELEASE_CAPSULE | Freq: Every day | ORAL | Status: DC
Start: 2012-02-19 — End: 2012-10-11

## 2012-02-19 NOTE — Assessment & Plan Note (Addendum)
Referral to CSW for assistance with transportation, medications, etc.   I have also provided her with paper Rx for her medications to check if she can get them cheaper somewhere else.  She will call the clinic if she finds a cheaper alternative.

## 2012-02-19 NOTE — Assessment & Plan Note (Signed)
LDL improved to 138, though I am not sure if this is mostly due to her not eating.   Will continue to monitor, no change in therapy now as 138 is close to goal (< 130 with HTN)

## 2012-02-19 NOTE — Assessment & Plan Note (Signed)
BP well controlled today.  Continue to monitor.   She is on no medications

## 2012-02-19 NOTE — Patient Instructions (Addendum)
Please schedule a follow up visit within the next 1 - 2 months.   For your medications:   Please bring all of your pill  Bottles with you to each visit.  This will help make sure that we have an up to date list of all the medications you are taking.  Please also bring any over the counter herbal medications you are taking (not including advil, tylenol, etc.)  Please continue taking your medications as prescribed.  You will have paper Rx for your medications to [facility]  Please call and let us know if you would like [place] to send an electronic refill for these medications to a new pharmacy  Our CSW Lynnae January will be calling you to discuss some options to help you with your current living situation.  Please let her know how she can help you get assistance.   Please call in to the clinic and leave me a message if you need to discuss anything.  I will call you back at the soonest possible time.   Thank you!

## 2012-02-19 NOTE — Assessment & Plan Note (Signed)
Colonoscopy: 2010 - diverticulosis, no polyps DEXA - reported as 2008, osteopenia.  Continue vitamin D and Calcium MMG - 04/2011, negative PAP - 05/2011, inadequate - since we had acute issue to discuss today, we will plan to see her next time for this   Flu: 10/2011 Pneumovax at 63yo Tetanus: needs updating
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
## 2012-02-19 NOTE — Assessment & Plan Note (Signed)
Not completely controlled, but improved with increased Xanax prescription.   I think a lot of her issue comes from external forces including inability to pay her bills and estranged relationship with her daughter.   Would like to get her in to see [facility], if she would agree too.

## 2012-02-19 NOTE — Assessment & Plan Note (Addendum)
She is having a lot of continued symptoms, though, as noted above for anxiety, this is likely also related to external factors including family issues, financial issues and not being able to work.   Will continue buproprion and lexapro for now.  If some of her financial issues can be improved will consider weaning off of these medications and starting something else.   Would also like her to see [facility].  She is refusing referral at this time due to "it not working before."  Will attempt referral at next visit.   Have advised her to call in for any acute change in her symptoms or if she were to develop SI/HI for acute evaluation.

## 2012-02-26 ENCOUNTER — Telehealth: Payer: Self-pay | Admitting: Licensed Clinical Social Worker

## 2012-02-26 NOTE — Telephone Encounter (Signed)
[name] was referred to CSW for financial issues, transportation, and cost of medications.  CSW placed call to [name].  [name] states she has not difficulty with transportation, just the cost of gas prevents her from making too many trip out.  Transportation to doctor's appt is not an issue at this time.  [name] states she can afford her medications just would like to know other available pharmacies, as [name] does not like Adult nurse.  [name] has requested to be notified when refills are need but has only received a reminder call once.  [name] but wants to make sure cost is the same.  [name] not [facility] to check the pricing.  [name] states her [facility] medicare pays for her prescriptions.  [name] voiced concern of the cost of utilities and food.  Stating often she does not eat because of lack of food.  [name] had applied for food stamps, but states she only received $7, and did not follow up.  CSW will check [employer]'s website for [employer].  [name] feels getting out to go to the pharmacy is beneficial to her, therefore, CSW did not bring up pharmacy delivery options.  CSW discussed [facility] services and possibility of being appropriate for care management to assist with resources for managing budget with medications, food and utilities. [name] agreeable for referral.

## 2012-02-29 NOTE — Telephone Encounter (Signed)
CSW placed call to Ms. Mccuiston to provide listing of in-network pharmacies per [employer]'s site: [facility] all within 1 mi of pt's address.  Pt notified.  Pt has yet to hear from [facility], regarding utility programs for seniors.  Pt has already utilized [employer] in Feb. 2013, pt needs monthly assistance.

## 2012-03-12 NOTE — Telephone Encounter (Signed)
CSW placed call to Ms. Schinke to inform pt there are no additional resources per [facility] to assist with montly resources.  CSW informed Ms. Varnum about Medical laboratory scientific officer.  Pt states she has no funds for gas or utilities.  Pt voiced having a feeling of hopelessness and has not gotten out of bed to access any resources.  Pt is not in counseling and currently declines Mental Health Access 24/7 Access Line.  CSW will reach out to [facility] to confirm referral received.

## 2012-03-18 ENCOUNTER — Ambulatory Visit (INDEPENDENT_AMBULATORY_CARE_PROVIDER_SITE_OTHER): Payer: Medicare Other | Admitting: Internal Medicine

## 2012-03-18 VITALS — BP 129/78 | HR 72 | Temp 97.5°F | Ht 60.0 in | Wt 167.6 lb

## 2012-03-18 DIAGNOSIS — F322 Major depressive disorder, single episode, severe without psychotic features: Secondary | ICD-10-CM

## 2012-03-18 DIAGNOSIS — F329 Major depressive disorder, single episode, unspecified: Secondary | ICD-10-CM

## 2012-03-18 DIAGNOSIS — Z0000 Encounter for general adult medical examination without abnormal findings: Secondary | ICD-10-CM

## 2012-03-18 NOTE — Assessment & Plan Note (Signed)
Patient has sleep disturbance, loss of interest, guilt/worthlessness, lack of energy, loss of concentration, lack of appetite and psychomotor retardation.  She is not actively SI/HI, though I would say that she has passive thoughts of being "ok" with dying.  She has severe depression, however, is very resistant to seeking appropriate help including psychiatry.  She has many pyschosocial issues which are also contributing, however, she is not interested in assistance with these issues either.  I attempted a compromise with her today to see BH, however, she left prior to having the appointment made.    She will need frequent follow up, however, I strongly feel she should be followed by a Psychiatrist or Psychologist and she likely needs titration/change of her medications, though I feel this is outside of my scope of practice. Will make referral to [facility] and request SW assistance with scheduling.

## 2012-03-18 NOTE — Patient Instructions (Addendum)
Please schedule a follow up visit within the next 1 month.   For your medications:   Please bring all of your pill  Bottles with you to each visit.  This will help make sure that we have an up to date list of all the medications you are taking.  Please also bring any over the counter herbal medications you are taking (not including advil, tylenol, etc.)  Please continue taking your medications as prescribed.   We will make you an appointment with [facility]; please keep this appointmetn.    Thank you!

## 2012-03-18 NOTE — Progress Notes (Signed)
Subjective:    Patient ID: [name], female    DOB: 1950-10-15, 62 y.o.   MRN: [id number]  CC: Return visit for PAP/Pelvic    HPI  [name] is a 62yo woman with PMH of severe depression who presented today for a PAP/Pelvic for screening as her last one did not have adequate cell numbers.   However, [name] was not interested in PAP/Pelvic today due to "I don't care" if we find anything.  We had a long discussion today concerning [name]'s symptoms of Anxiety and Depression.  She is positive for changes in her sleep.  She currently sleeps most of the day and only gets up to care for her dogs and occasional walk her neighbors dog.  She states that this lack of sleep is due to back pain and due to lack of heat in her house. She also has complete loss of interest in any activity, saying multiple times during the interview about how she "doesn't care about anything."  She reports feeling worthless and guilty that she is not there for her grandson.  She has a very apparent lack of energy and only interested in staying in bed all day.  She further has decreased concentration, causing her to have increased accidents (falls) and forget things she has read along with loss of appetite and some psychomotor retardation.  She adamantly denies any SI or HI, as she says that she would not "do that" to her dogs or grandchildren.    She reports being angry with her health care providers as they do not prescribe her "strong enough" pain medication for her back.  She is not interested in PT and not willing to try therapy at this time.  She has tried tramadol and tylenol for her pain, however, she notes that only advil works for her.  She notes taking large doses; however, I am concerned that she only notes this to coerce me into giving her narcotics at this time.   I spent the interview time attempting to encourage [name] to visit a psychiatrist as she does need some help for her  depression and likely needs changes to her anti-depressant/anxiety regimen.  I requested that she attempt a visit with a psychiatrist once and we could consider together what to do for her pain regimen.  I involved our CSW, Lynnae January, however, [name] left prior to being seen by Ms. Mosetta Putt.   Review of Systems  Musculoskeletal: Positive for back pain.  Neurological: Positive for headaches.  Psychiatric/Behavioral: Positive for confusion, sleep disturbance, dysphoric mood and decreased concentration. Negative for suicidal ideas, hallucinations, behavioral problems, self-injury and agitation. The patient is nervous/anxious. The patient is not hyperactive.        Objective:   Physical Exam  Constitutional: She appears well-developed and well-nourished. No distress.  HENT:  Head: Normocephalic and atraumatic.  Skin: She is not diaphoretic.  Psychiatric: Her mood appears anxious. Her affect is angry and labile. Her affect is not blunt and not inappropriate. Her speech is not rapid and/or pressured, not delayed, not tangential and not slurred. She is withdrawn. She is not agitated, not aggressive, not hyperactive, not slowed, not actively hallucinating and not combative. Thought content is not paranoid and not delusional. She exhibits a depressed mood. She expresses no homicidal and no suicidal ideation. She expresses no suicidal plans and no homicidal plans.       Assessment & Plan:  RTC in 64 month for further discussion re: depression, BH  consult

## 2012-03-18 NOTE — Assessment & Plan Note (Signed)
Refused PAP smear today

## 2012-03-19 ENCOUNTER — Telehealth: Payer: Self-pay | Admitting: Licensed Clinical Social Worker

## 2012-03-19 ENCOUNTER — Encounter: Payer: Self-pay | Admitting: Licensed Clinical Social Worker

## 2012-03-19 NOTE — Telephone Encounter (Signed)
[name] was referred to CSW to schedule appt for pt for Psychiatry.  CSW placed call to [facility].  Pt has an appt on 3/10 at 11am.  CSW placed called to pt.  CSW left message requesting return call. CSW provided contact hours and phone number. Letter with information about appt mailed.

## 2012-03-20 ENCOUNTER — Telehealth: Payer: Self-pay | Admitting: *Deleted

## 2012-03-20 NOTE — Telephone Encounter (Signed)
Called pt - no answer, message left on answering machine (I had talked to pt this morning and she agreed to see Dr Criselda Peaches on 3/24.)

## 2012-03-20 NOTE — Telephone Encounter (Signed)
Message copied by Hassan Buckler on Wed Mar 20, 2012  1:12 PM ------      Message from: Shon Hough F      Created: Wed Mar 20, 2012 11:53 AM      Regarding: RE: Appointment for Ms. Estabrooks       Appointment made on 04/15/2012 at 2      ----- Message -----         From: Hassan Buckler, RN         Sent: 03/20/2012   9:29 AM           To: Jacob Moores      Subject: RE: Appointment for Ms. Barillas                        Called pt she agree to come on 3/24 - afternoon appt. Thanks      ----- Message -----         From: Jacob Moores         Sent: 03/19/2012   9:24 AM           To: Hassan Buckler, RN      Subject: FW: Appointment for Ms. Haskin                        Per Tyler Aas.  Rivka Barbara would you please call the patient first to see if she still wants to come to the clinic then we will make and appointment.                     Thanks,      Chilon      ----- Message -----         From: Inez Catalina, MD         Sent: 03/18/2012   3:51 PM           To: Hassan Buckler, RN, Wynona Neat, #      Subject: Appointment for Ms. Xiong                            Can you please set me up with an appointment on 3/24 in the afternoon (2pm or 3pm) with Ms. Darrough.  I do not have clinic that day, but will see her for a visit.             Martesha Niedermeier: could you call her to tell her about the appointment            Thanks!                   ------

## 2012-03-25 NOTE — Telephone Encounter (Signed)
CSW attempted to reach pt by phone to inform pt of appt that has been scheduled.  CSW mailed letter to patient with information on 03/19/2012.  CSW left message on answering machine notifying pt.

## 2012-03-27 NOTE — Telephone Encounter (Signed)
Pt received CSW letter and paperwork from [facility].  Pt voiced appt day and time.

## 2012-04-01 ENCOUNTER — Encounter (HOSPITAL_COMMUNITY): Payer: Self-pay | Admitting: Psychiatry

## 2012-04-01 ENCOUNTER — Ambulatory Visit (INDEPENDENT_AMBULATORY_CARE_PROVIDER_SITE_OTHER): Payer: Medicare Other | Admitting: Psychiatry

## 2012-04-01 DIAGNOSIS — F329 Major depressive disorder, single episode, unspecified: Secondary | ICD-10-CM

## 2012-04-01 NOTE — Progress Notes (Signed)
Patient ID: [name], female   DOB: 08-20-1950, 62 y.o.   MRN: [id number] Chief complaint I don't know why I am here.  My doctor told me to see psychiatrist.  History presenting illness Patient is 62 year old divorced Caucasian unemployed female who is referred from her primary care physician Dr. Trixie Rude for evaluation and treatment.  Patient is somewhat upset about her appointment.  She told my Dr. feel that I should see psychiatrist.  Patient endorse chronic long-standing depression.  She feel her major stress in her life is lack of money.  Patient was recently seen at [facility] she endorse chronic passive suicidal thinking and an appointment was made.  Patient endorse that she has these feeling most of her life but she never act on any suicidal attempt.  She loves her dog and grandson.  She reported that none of the medication will help her depression, she's taking Lexapro and Wellbutrin and denies any side effects.  She is also taking Xanax which is prescribed her primary care physician.  She endorse poor sleep racing thoughts and lack of motivation to do things.  She also endorse decreased energy and chronic feeling of hopelessness.  She's not happy with her primary care physician who did not provide pain medication for her chronic back pain.  She's not happy with the health system overall.  She endorse just because I don't have a lot of money my daughter does not want to talk to me.  Patient endorse sometime crying spells but denies any active suicidal thoughts.  She denies any paranoia or any hallucination.  She denies any aggression and violence or aggressive behavior.  She does not abuse drugs or any illegal substance.  She admitted more disappointment when she returned from [place] last February.  She was hoping to stay there however her daughter did not make any efforts to keep the relationship.  Patient endorse that her daughter is more close to her father because he has  more money.  Patient told that she's been divorce in 36s and since then she has issues with finances.  Patient admitted she went to [place] year half ago when she find out that her son-in-law is diagnosed with cancer.  She was hoping that she will help the daughter.  Her son-in-law died in 03-Nov-2022 and patient tried to stay [place] another few months but then does become disappointed and move back [place].  Patient does not want to change her medication however after some encouragement she decided to see therapist.  Past psychiatric history Patient has seen psychiatrist in the past in [place] however did not like the psychiatrist.  She was seeing therapist but also did not like therapist.  In the past she had tried Prozac Effexor and trazodone.  She denies any history of suicidal attempt.  She denies any history of mania or psychosis.  She is taking Wellbutrin and Lexapro but as prescribed initially by psychiatrists but then she start taking these medication from her primary care physician.  She also take Xanax.  Psychosocial history Patient was born in [place].  She moved to [place] after she wanted to start a new life.  One of her friend recently moved to [place] and encourage her to move and.  She's been living in [place] for past 10 years.  Patient's parents are deceased.  She got divorced in 55s.  She endorse that she got bored and her marriage.  Patient does not have any contact with  her ex who remarried 5 years ago.  Patient has one daughter who lives in [place].  She has any one 64 years old grandson.  Patient lives by herself.  Family history Patient endorse sibling has psychiatric problems.  She did not provide much detail.  Education and work history Patient has some college education.  She's on disability due to depression for past 10 years.  Medical history Patient has history of hyperlipidemia, migraine headaches, hypertension and chronic back pain.  Her  primary care physician is Dr. Trixie Rude.  Alcohol and substance use history Patient denies any history of alcohol or illegal substance use.  Review of Systems  Musculoskeletal: Positive for back pain and joint pain.  Psychiatric/Behavioral: Positive for depression. Negative for suicidal ideas, hallucinations and substance abuse. The patient is nervous/anxious and has insomnia.     Mental status examination Patient is overweight female who appears to be in her stated age.  She is casually dressed and well-groomed.  Her speech is clear and coherent.  Her thought processes slow but logical linear and goal-directed.  She described her mood is sad and depressed and her affect is constricted.  She endorse decreased energy but denies any active or passive suicidal thoughts or homicidal thoughts at this time.  She denies any auditory or visual hallucination.  There were no paranoia or delusion present at this time.  Her attention and concentration is fair.  There were no tremors or shakes present.  Her psychomotor activity is normal.  She's alert and at x3.  Her insight judgment and impulse control is okay.  Assessment Axis I depressive disorder NOS, rule out Maj. depressive disorder Axis II deferred Axis III see medical history Axis IV mild to moderate Axis V 64 to 64  Plan I review her history, symptoms, medication and psychosocial stressors.  Patient is taking 2 antidepressant without any side effects.  Patient is somewhat resistant to change her antidepressant and Xanax.  She does not feel that any change in medication will change her economic situation .  However after a long discussion she agreed to see therapist for coping and social skills.  We will scheduled appointment with therapist.  At this time she will continue her current psychiatric medication.  Safety plan discussed that anytime having active suicidal thoughts or homicidal thoughts and she need to call 911 or go to local emergency room.  I  will see her again in 4-6 weeks .   Time spent 60 minutes.

## 2012-04-09 ENCOUNTER — Other Ambulatory Visit: Payer: Self-pay | Admitting: *Deleted

## 2012-04-09 DIAGNOSIS — F419 Anxiety disorder, unspecified: Secondary | ICD-10-CM

## 2012-04-09 DIAGNOSIS — F329 Major depressive disorder, single episode, unspecified: Secondary | ICD-10-CM

## 2012-04-09 MED ORDER — ALPRAZOLAM 0.5 MG PO TABS: 0.5000 mg | ORAL_TABLET | Freq: Three times a day (TID) | ORAL | Status: DC | PRN

## 2012-04-09 NOTE — Telephone Encounter (Signed)
Rx called in to pharmacy and pt aware. 

## 2012-04-15 ENCOUNTER — Encounter: Payer: Self-pay | Admitting: Internal Medicine

## 2012-04-15 ENCOUNTER — Ambulatory Visit (INDEPENDENT_AMBULATORY_CARE_PROVIDER_SITE_OTHER): Payer: Medicare Other | Admitting: Internal Medicine

## 2012-04-15 VITALS — BP 130/83 | HR 85 | Temp 98.8°F | Ht 60.0 in | Wt 175.6 lb

## 2012-04-15 DIAGNOSIS — G43819 Other migraine, intractable, without status migrainosus: Secondary | ICD-10-CM

## 2012-04-15 DIAGNOSIS — G43119 Migraine with aura, intractable, without status migrainosus: Secondary | ICD-10-CM

## 2012-04-15 DIAGNOSIS — F322 Major depressive disorder, single episode, severe without psychotic features: Secondary | ICD-10-CM

## 2012-04-15 MED ORDER — BUTALBITAL-APAP-CAFFEINE 50-325-40 MG PO TABS
1.0000 | ORAL_TABLET | Freq: Three times a day (TID) | ORAL | Status: DC | PRN
Start: 2012-04-15 — End: 2012-04-24

## 2012-04-15 NOTE — Progress Notes (Signed)
  Subjective:    Patient ID: [name], female    DOB: 1950-12-23, 62 y.o.   MRN: [id number]  CC: Follow up for depression  HPI  [name] presents today for follow up of her depression.  She is currently treated with lexapro, wellbutrin and xanax prn for her anxiety/depression.  Much of her situation is also due to socioeconomic status.   We spoke at length today about [name]'s symptoms including increased sleep, decreased energy, decreased coordination and decreased interest in things that she used to enjoy.  She continues to passively note that "no one would care if I weren't here."  She has a very estranged relationship with her daughter, however, it is still unclear how this occurred.  [name] did shed some light into her previous lifestyle.  She has been married at least 3 times, with the last two lasting a very short period of time.  She notes that she used to enjoy a more affluent lifestyle and her current money situation is a large part of why she has some of the symptoms she has.  She persists in "not wanting handouts" but also notes that if she had more money she would not be as depressed as she is.  She is considering finding a new home for her dogs, but does not want to give them to someone who will not take care of them how she does. She does not feel supported by her friends here and is not in regular contact with her daughter.  She has also has concomitant chronic joint pain, particularly in the knees.  We discussed options at length including PT, aquatherapy, exercise, injections, and finally surgery.  She is not interested in any of these interventions.   I think she may benefit from a trial of an SNRI, such as pristiq as she does not feel her lexapro is helping.  She did visit with a psychiatrist recently and has an appointment with a counselor.  I advised her to keep an open mind to counseling and to not give up on it too soon.  She did not like the  psychiatrist she met with as she felt he was "rude" and interrupted her.  However, I think this is largely due to her feeling that she does not need a psychiatrist.   Because she did see a therapist as requested, I have discussed with her some further management of her chronic headaches and knee pain.  Will provide her with a trial of fioricet, which she has had success with previously for management of her migraines.    Review of Systems  Constitutional: Positive for activity change, appetite change and fatigue. Negative for fever and unexpected weight change.  Psychiatric/Behavioral: Positive for sleep disturbance, dysphoric mood, decreased concentration and agitation. Negative for suicidal ideas, hallucinations, confusion and self-injury. The patient is nervous/anxious.        Objective:   Physical Exam  Psychiatric: Her mood appears anxious. Her affect is angry and labile. Her affect is not blunt and not inappropriate. Her speech is not rapid and/or pressured, not delayed and not slurred. She is agitated. She is not hyperactive and not combative. Thought content is not paranoid and not delusional. She exhibits a depressed mood. She expresses no homicidal ideation. She expresses no suicidal plans and no homicidal plans.    No labs      Assessment & Plan:  I will see her in April, already scheduled, after visit with counselor.

## 2012-04-15 NOTE — Assessment & Plan Note (Signed)
Prescribe fioricet, 20 tabs for trial of headache management.   I am concerned she has rebound headaches as well due to chronic NSAID use, which we discussed at length today.  She will attempt to replace NSAID use with fioricet at least once daily.  Counseled on taking too much tylenol.

## 2012-04-15 NOTE — Patient Instructions (Addendum)
Please come back to see me for your appointment in April.   Please keep your appointment with psychotherapy.  I think that you will get some benefit from this if you keep an open mind!  For your medications:   Please bring all of your pill  Bottles with you to each visit.  This will help make sure that we have an up to date list of all the medications you are taking.  Please also bring any over the counter herbal medications you are taking (not including advil, tylenol, etc.)  Please continue taking all of your medications as prescribed.   Thank you!

## 2012-04-15 NOTE — Assessment & Plan Note (Signed)
Continues with all of the classic symptoms of major depression.  I do not think she is bipolar as she does not express symptoms of mania.   Continue lexapro and wellbutrin.  Could consider pristiq, but I would defer to her psychiatrist for weaning of one med in favor of another.   Continue seeing psychiatry.

## 2012-04-22 ENCOUNTER — Encounter (HOSPITAL_COMMUNITY): Payer: Self-pay | Admitting: Licensed Clinical Social Worker

## 2012-04-22 ENCOUNTER — Ambulatory Visit (INDEPENDENT_AMBULATORY_CARE_PROVIDER_SITE_OTHER): Payer: Medicare Other | Admitting: Licensed Clinical Social Worker

## 2012-04-22 DIAGNOSIS — F1210 Cannabis abuse, uncomplicated: Secondary | ICD-10-CM

## 2012-04-22 DIAGNOSIS — F339 Major depressive disorder, recurrent, unspecified: Secondary | ICD-10-CM | POA: Insufficient documentation

## 2012-04-22 DIAGNOSIS — F332 Major depressive disorder, recurrent severe without psychotic features: Secondary | ICD-10-CM
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
## 2012-04-24 ENCOUNTER — Other Ambulatory Visit: Payer: Self-pay | Admitting: *Deleted

## 2012-04-24 MED ORDER — BUTALBITAL-APAP-CAFFEINE 50-325-40 MG PO TABS: 1.0000 | ORAL_TABLET | Freq: Three times a day (TID) | ORAL | Status: DC | PRN

## 2012-04-24 NOTE — Telephone Encounter (Signed)
Called to pharm 

## 2012-04-27 ENCOUNTER — Encounter: Payer: Self-pay | Admitting: Internal Medicine

## 2012-04-27 DIAGNOSIS — R269 Unspecified abnormalities of gait and mobility: Secondary | ICD-10-CM | POA: Insufficient documentation

## 2012-04-29 ENCOUNTER — Ambulatory Visit (INDEPENDENT_AMBULATORY_CARE_PROVIDER_SITE_OTHER): Payer: Medicare Other | Admitting: Psychiatry

## 2012-04-29 ENCOUNTER — Encounter (HOSPITAL_COMMUNITY): Payer: Self-pay | Admitting: Psychiatry

## 2012-04-29 VITALS — BP 138/86 | HR 90 | Wt 173.0 lb

## 2012-04-29 DIAGNOSIS — F329 Major depressive disorder, single episode, unspecified: Secondary | ICD-10-CM

## 2012-04-29 MED ORDER — LAMOTRIGINE 25 MG PO TABS: ORAL_TABLET | ORAL | Status: DC
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
## 2012-05-06 ENCOUNTER — Encounter: Payer: Medicare Other | Admitting: Internal Medicine

## 2012-05-08 ENCOUNTER — Telehealth: Payer: Self-pay | Admitting: Internal Medicine

## 2012-05-08 ENCOUNTER — Telehealth: Payer: Self-pay | Admitting: *Deleted

## 2012-05-08 ENCOUNTER — Ambulatory Visit (INDEPENDENT_AMBULATORY_CARE_PROVIDER_SITE_OTHER): Payer: Medicare Other | Admitting: Internal Medicine

## 2012-05-08 ENCOUNTER — Encounter: Payer: Self-pay | Admitting: Internal Medicine

## 2012-05-08 VITALS — BP 160/80 | HR 85 | Temp 98.3°F | Ht 60.0 in | Wt 166.6 lb

## 2012-05-08 DIAGNOSIS — A084 Viral intestinal infection, unspecified: Secondary | ICD-10-CM | POA: Insufficient documentation

## 2012-05-08 DIAGNOSIS — A088 Other specified intestinal infections: Secondary | ICD-10-CM

## 2012-05-08 MED ORDER — SODIUM CHLORIDE 0.9 % IV BOLUS (SEPSIS)
1000.0000 mL | Freq: Once | INTRAVENOUS | Status: AC
  Administered 2012-05-08: 1000 mL via INTRAVENOUS

## 2012-05-08 MED ORDER — PROMETHAZINE HCL 12.5 MG PO TABS: 12.5000 mg | ORAL_TABLET | Freq: Four times a day (QID) | ORAL | Status: DC | PRN

## 2012-05-08 MED ORDER — ONDANSETRON 4 MG PO TBDP: 4.0000 mg | ORAL_TABLET | Freq: Three times a day (TID) | ORAL | Status: DC | PRN

## 2012-05-08 NOTE — Progress Notes (Signed)
HPI The patient is a 62 y.o. female with a history of HTN, HL, migraine, presenting for an acute visit for nausea, vomiting, and diarrhea.  The patient notes that 2 days ago, she got "hot" while outside mowing her lawn.  Yesterday, at 3:30 am, she experienced a  headache typical of her migraines, and took 800 mg advil.  Later that morning she started feeling nauseous, as well as vomiting of food particles about 5 times that day, as well as 2 loose watery stools.  Today, she again experienced vomiting, though mostly "dry heaves", about 3 times, though with no diarrhea.  She also notes some mild epigastric soreness, which started only after vomiting several times.  She has been unable to take her medications over the last 2 days due to nausea and vomiting.  She notes that a friend had similar symptoms who was in her house over the weekend 3-4 days ago.  She notes no fevers.  She notes decreased PO intake over this time period.  ROS: General: no fevers, changes in weight, changes in appetite Skin: no rash HEENT: no blurry vision, hearing changes, sore throat Pulm: no dyspnea, coughing, wheezing CV: no chest pain, palpitations, shortness of breath Abd: see HPI GU: no dysuria, hematuria, polyuria Ext: no arthralgias, myalgias Neuro: no weakness, numbness, or tingling  Filed Vitals:   05/08/12 1457  BP: 160/80  Pulse: 85  Temp: 98.3 F (36.8 C)    PEX General: alert, cooperative, and in no apparent distress HEENT: pupils equal round and reactive to light, vision grossly intact, oropharynx clear and non-erythematous, mucous membranes moist Neck: supple, no lymphadenopathy Lungs: clear to ascultation bilaterally, normal work of respiration, no wheezes, rales, ronchi Heart: regular rate and rhythm, no murmurs, gallops, or rubs Abdomen: soft, mildly tender to epigastric palpation, non-distended, +bs, no guarding or rebound tenderness Extremities: no cyanosis, clubbing, or edema Neurologic:  alert & oriented X3, cranial nerves II-XII intact, strength grossly intact, sensation intact to light touch  Current Outpatient Prescriptions on File Prior to Visit  Medication Sig Dispense Refill  . ALPRAZolam (XANAX) 0.5 MG tablet Take 1 tablet (0.5 mg total) by mouth 3 (three) times daily as needed for sleep or anxiety (Please take 2 tablets at bedtime for sleep.).  120 tablet  3  . azelastine (ASTELIN) 137 MCG/SPRAY nasal spray Place 2 sprays into the nose 2 (two) times daily. Use in each nostril as directed  30 mL  2  . buPROPion (WELLBUTRIN XL) 300 MG 24 hr tablet Take 1 tablet (300 mg total) by mouth every morning.  30 tablet  6  . butalbital-acetaminophen-caffeine (FIORICET) 50-325-40 MG per tablet Take 1-2 tablets by mouth every 8 (eight) hours as needed for headache.  20 tablet  1  . escitalopram (LEXAPRO) 20 MG tablet Take 1 tablet (20 mg total) by mouth daily.  30 tablet  2  . lamoTRIgine (LAMICTAL) 25 MG tablet Take 1 tab daily for 1 week and than 2 tab daily  60 tablet  0  . omeprazole (PRILOSEC) 20 MG capsule Take 1 capsule (20 mg total) by mouth daily.  30 capsule  3  . [DISCONTINUED] exenatide (BYETTA 5 MCG PEN) 5 MCG/0.02ML SOLN Inject 0.02 mLs (5 mcg total) into the skin 2 (two) times daily with a meal.  1.2 mL  3  . [DISCONTINUED] famotidine (PEPCID) 40 MG tablet Take 1 tablet (40 mg total) by mouth daily.  30 tablet  11   No current facility-administered medications on file  prior to visit.    Assessment/Plan

## 2012-05-08 NOTE — Patient Instructions (Signed)
General Instructions: Your symptoms are likely due to viral gastroenteritis (also called the "stomach flu").  Your symptoms seem to be improving, and will likely resolve over the next 24 hours.  Until then, we are prescribing Zofran tablets.  Place 1 tablet under the tongue every 6-8 hours as needed for nausea. -if this medication is too expensive, we have also given you a paper prescription for a cheaper medication that must be taken by mouth, and does not dissolve under the tongue, called Phenergan.  Take 1 tablet every 6-8 hours as needed for nausea.  If your symptoms are not improving in the next 1-2 days, please return to the clinic for follow-up or seek medical attention in the ED or urgent care.   Treatment Goals:  Goals (1 Years of Data) as of 05/08/12     Lifestyle    . Prevent Falls       Progress Toward Treatment Goals:  Treatment Goal 05/08/2012  Blood pressure unchanged    Self Care Goals & Plans:  Self Care Goal 03/18/2012  Manage my medications take my medicines as prescribed  Eat healthy foods drink diet soda or water instead of juice or soda; eat foods that are low in salt  Be physically active take a walk every day       Care Management & Community Referrals:

## 2012-05-08 NOTE — Assessment & Plan Note (Signed)
The patient presents with a 1-day history of nausea, vomiting, and diarrhea, after contact with a friend who had similar symptoms.  Also on the differential is atypical migraine, since her symptoms were preceded by a migraine, but this would likely not explain her diarrhea.  Her mucous membranes were moist, and she was not tachycardic.  She noted that her symptoms were already improving, with less vomiting and no diarrhea today. -the patient was given 1 L of NS in clinic -she was prescribed zofran ODT tabs for nausea

## 2012-05-08 NOTE — Telephone Encounter (Signed)
Patient called from home at 8:30 PM. Patient reported that she was seen in clinic today because for nausea, vomiting and diarrhea. She was diagnosed as possible viral gastroenteritis. She was treated with IV fluids in clinic and discharged home on Zofran. She took one tablet of Zofran at 6:00 PM and she vomited at 6:30 pm. Patient asked whether she needs to take another pill because she may have thrown up all her medications.   She reports that currently her nausea is very mild. She did not have new episode of diarrhea or vomiting. She has mild headache. She denies fever, chills or abdominal pain. She reports having palpitation, but her pulse rate is approximately 60-70/min. I suggested her to take an another pill of Zofran at 10:30 PM since her nausea is mild now. She understood and I agreed to do so.   Lorretta Harp, MD PGY2, Internal Medicine Teaching Service Pager: [phone]

## 2012-05-08 NOTE — Telephone Encounter (Signed)
Pt calls and states she has h/a, nausea, vomiting, diarrhea x 24 hrs, unable to keep anything down. She is advised to go to urg care for eval and refuses, she is scheduled this pm w/ dr brown at 1445. She is advised if she becomes worse or feels as if she needs help to please go to urg care or ED and states she knows she will wait.

## 2012-05-08 NOTE — Progress Notes (Signed)
1600PM - IV started with #22 angiocath in left wrist; good bld return; site unremarkable. 1Lof NS bolus infusing.

## 2012-05-09 ENCOUNTER — Telehealth: Payer: Self-pay | Admitting: *Deleted

## 2012-05-09 NOTE — Telephone Encounter (Signed)
Pt called has fine rash over arms,legs and chest. Feeling much better. Has taken 2 Phenergan so far. To stop Phenergan - will try Benedryl - if rash cont or gets worse suggest to go to ER. [facility] Augustine RN 05/09/12 3:20PM

## 2012-05-13 ENCOUNTER — Ambulatory Visit (INDEPENDENT_AMBULATORY_CARE_PROVIDER_SITE_OTHER): Payer: Medicare Other | Admitting: Licensed Clinical Social Worker

## 2012-05-13 ENCOUNTER — Encounter (HOSPITAL_COMMUNITY): Payer: Self-pay

## 2012-05-13 DIAGNOSIS — F332 Major depressive disorder, recurrent severe without psychotic features: Secondary | ICD-10-CM

## 2012-05-13 NOTE — Progress Notes (Signed)
   THERAPIST PROGRESS NOTE  Session Time: 1:00pm-1:50pm  Participation Level: Active  Behavioral Response: Well GroomedAlertDepressed  Type of Therapy: Individual Therapy  Treatment Goals addressed: Coping  Interventions: CBT, Motivational Interviewing, Strength-based, Supportive and Reframing  Summary: [name] is a 62 y.o. female who presents with depressed mood and labile affect. She reports that today is a better day and she is not as depressed and hopeless as she usually is. When asked what she wants to focus on, she is uncertain. Discussed her previous statement that she was not interested in therapy and was going to come to "play your game" in order to get medication. Patient is willing to try therapy again to see if she can experience a different outcome. She endorses food addiction as an ongoing problem. She eats sweets excessively at night. She is aware that she is doing this, but is unable to identify what her triggers are or what purpose food serves her. She reflects upon her sadness over a good friend who is sick and the multiple losses she has suffered in her life.   Suicidal/Homicidal: Nowithout intent/plan  Therapist Response: Assessed patients current functioning and reviewed progress. Reviewed coping strategies. Assessed patients safety and assisted in identifying protective factors.  Reviewed crisis plan with patient. Assisted patient with the expression of frustration. Reviewed patients self care plan. Assessed progress related to self care. Patients self care is fair. Recommend daily exercise, increased socialization and recreation. Processed and normalized patients grief reaction. Used CBT to assist patient with the identification of negative distortions and irrational thoughts. Encouraged patient to verbalize alternative and factual responses which challenge thought distortions. Used motivational interviewing to assist and encourage patient through the change  process. Explored patients barriers to change. Patient demonstrates some willingness to explore treatment.   Plan: Return again in three weeks.  Diagnosis: Axis I: Major Depression, Recurrent severe    Axis II: No diagnosis    Madhav Mohon, LCSW 05/13/2012

## 2012-05-15 ENCOUNTER — Telehealth (HOSPITAL_COMMUNITY): Payer: Self-pay | Admitting: Psychiatry

## 2012-05-15 ENCOUNTER — Telehealth (HOSPITAL_COMMUNITY): Payer: Self-pay | Admitting: *Deleted

## 2012-05-15 NOTE — Telephone Encounter (Signed)
Contacted pt per Dr.Arfeen's instructions. Instructed pt to continue new medicine [Lamictal] as ordered, if there is no rash. Symptoms should subside. Patient asks that Dr.Arfeen be informed she will not be continuing medication as she felt too bad and does not want to feel that way, even for a short time.

## 2012-05-15 NOTE — Telephone Encounter (Signed)
VM left 4/22 @ 1606 recv'd 4/23 @ 0855: Took new medicine for first time today [4/22]. Had not started new medicine prescribed last appt with Dr.Arfeen until today due to stomach flu  After one dose - light headed, dizzy, nauseated, hot/cold and clammy. Was told to stop taking if she developed a rash.States does not have have rash.  States she will stop taking it as she "can't get a hold of anyone today" [4/22]

## 2012-05-15 NOTE — Telephone Encounter (Signed)
Call return.  Spoke to the patient.  Patient has flulike symptoms last week and now she is feeling better.  She started Lamictal however after taking one dose she felt her flulike symptoms are coming back.  I recommend to stop Lamictal until symptoms completely dissolve.  Recommended to call us back if the symptoms still persist.

## 2012-05-27 ENCOUNTER — Ambulatory Visit (INDEPENDENT_AMBULATORY_CARE_PROVIDER_SITE_OTHER): Payer: Medicare Other | Admitting: Internal Medicine

## 2012-05-27 ENCOUNTER — Ambulatory Visit (HOSPITAL_COMMUNITY): Payer: Self-pay | Admitting: Psychiatry

## 2012-05-27 ENCOUNTER — Encounter: Payer: Self-pay | Admitting: Internal Medicine

## 2012-05-27 VITALS — BP 157/86 | HR 77 | Temp 97.8°F | Ht 60.0 in | Wt 175.2 lb

## 2012-05-27 DIAGNOSIS — G43119 Migraine with aura, intractable, without status migrainosus: Secondary | ICD-10-CM

## 2012-05-27 DIAGNOSIS — I10 Essential (primary) hypertension: Secondary | ICD-10-CM

## 2012-05-27 DIAGNOSIS — M545 Low back pain, unspecified: Secondary | ICD-10-CM

## 2012-05-27 DIAGNOSIS — G43819 Other migraine, intractable, without status migrainosus: Secondary | ICD-10-CM

## 2012-05-27 DIAGNOSIS — R635 Abnormal weight gain: Secondary | ICD-10-CM

## 2012-05-27 DIAGNOSIS — F332 Major depressive disorder, recurrent severe without psychotic features: Secondary | ICD-10-CM

## 2012-05-27 NOTE — Assessment & Plan Note (Signed)
We discussed this issue, worsened due to recent work in the yard.   Recommended lidocaine patches to back or OTC icy/hot patches for relief.

## 2012-05-27 NOTE — Patient Instructions (Addendum)
Please schedule a follow up visit within the next 2-3 months.   For your medications:   Please bring all of your pill  Bottles with you to each visit.  This will help make sure that we have an up to date list of all the medications you are taking.  Please also bring any over the counter herbal medications you are taking (not including advil, tylenol, etc.)  Please restart taking the lamictal as previously ordered by your psychiatrist.  Please keep your appointments with that provider.    Thank you!

## 2012-05-27 NOTE — Assessment & Plan Note (Signed)
Main issue for her today.   She is following with psychiatrist which i requested and a counselor.  She still does not seem to think that it will help, but she is being consistent.  She has noted to that provider, and concurred today with inability to keep herself from overeating.  I agree that a mood stabilizer may help her.  I advised her, now that her symptoms are improved from the gastroenteritis, that she start the Lamictal again as previously recommended with 25mg  daily for a week and then 50mg  daily.  She is due to see the psychiatrist Wednesday.   No changes to her other medications.

## 2012-05-27 NOTE — Assessment & Plan Note (Signed)
We discussed this issue today and discussed ways to help improve her eating habits

## 2012-05-27 NOTE — Assessment & Plan Note (Signed)
Headaches improved.  She is no longer having a severe daily headache after cutting back on ibuprofen/aleve.  She has taken the fioricet, but does not feel she needs it anymore.

## 2012-05-27 NOTE — Progress Notes (Signed)
  Subjective:    Patient ID: [name], female    DOB: 07-28-1950, 62 y.o.   MRN: [id number]  F/U of headaches, depression  HPI  [name] is a 62yo woman with PMH of HA, chronic pain, HLD, HDT and depression/anxiety who presents for follow up of these issues.   [name] has been seeing Psychiatry as requested.  This provider has advised the initiation of Lamictal for mood stabilization.  However, right at the start of taking the medications, it appears [name] developed acute gastroenteritis.  She has not started taking Lamictal again since the illness.  She does not believe that the lamictal caused her symptoms of gastroenteritis.    Lost 7 pounds during gi bug. Phenergan gave her maculopapular rash, improved.   Headaches are improved, still takes advil.  Doesn't think she needs fioricet anymore due to rebound headaches.  She is trying to not to take anything unless the pain gets to a "certain level."  The daily headaches are better.   Not taking medications every day.    Weight gain recently due to poor eating habits.  Worried about getting diabetes.  We discussed the risks of diabetes and discussed changes in diet that could help.   Still having a strained relationship with her daughter.  She is hopeless about this relationship.  Went to the kennel recently and saw a lot of old friends that she hadn't seen before.  This improved her mood.   Review of Systems  Constitutional: Negative for chills and fatigue.  Eyes: Negative for pain and visual disturbance.  Respiratory: Negative for shortness of breath.   Cardiovascular: Negative for chest pain.  Gastrointestinal:       Recently with GI bug and symptoms, resolved  Musculoskeletal: Positive for back pain. Negative for joint swelling.  Skin: Positive for rash. Negative for pallor.       With phenergan  Neurological: Positive for headaches. Negative for dizziness, seizures, syncope, weakness and light-headedness.        Objective:   Physical Exam  Constitutional: She is oriented to person, place, and time. She appears well-developed and well-nourished. No distress.  HENT:  Head: Normocephalic and atraumatic.  Eyes: Conjunctivae and EOM are normal. No scleral icterus.  Cardiovascular: Normal rate and regular rhythm.   Pulmonary/Chest: Effort normal and breath sounds normal. No respiratory distress. She has no wheezes.  Abdominal: Soft. Bowel sounds are normal.  Musculoskeletal: Normal range of motion. She exhibits no edema and no tenderness.  Neurological: She is alert and oriented to person, place, and time.  Skin: Skin is warm and dry. No erythema.  Psychiatric: Her behavior is normal. Thought content normal. Her mood appears not anxious. Her affect is not inappropriate. Her speech is rapid and/or pressured. Her speech is not slurred.  Pressured speech, interrupts regularly.    No labs.      Assessment & Plan:  RTC in 2-3 months.

## 2012-05-27 NOTE — Assessment & Plan Note (Signed)
BP Readings from Last 3 Encounters:  05/27/12 157/86  05/08/12 160/80  04/29/12 138/86    Lab Results  Component Value Date   NA 138 04/10/2011   K 3.9 04/10/2011   CREATININE 0.68 04/10/2011    Assessment: Blood pressure control: mildly elevated Progress toward BP goal:  deteriorated Comments: recheck was 151/90.  She is 4 which puts her goal at < 150/90.  She has had recent weight gain which is likely contributing.  Hopefully, if she is able to lose some weight, she can avoid medications.  She is not amenable to starting a medication today.   Plan: Medications:  not currently on medications Educational resources provided:   Self management tools provided:   Other plans: If still elevated at next appointment will discuss starting hctz.

## 2012-05-29 ENCOUNTER — Encounter (HOSPITAL_COMMUNITY): Payer: Self-pay | Admitting: Psychiatry

## 2012-05-29 ENCOUNTER — Ambulatory Visit (INDEPENDENT_AMBULATORY_CARE_PROVIDER_SITE_OTHER): Payer: Medicare Other | Admitting: Psychiatry

## 2012-05-29 VITALS — BP 120/70 | Wt 174.0 lb

## 2012-05-29 DIAGNOSIS — F329 Major depressive disorder, single episode, unspecified: Secondary | ICD-10-CM

## 2012-05-29 MED ORDER — LAMOTRIGINE 25 MG PO TABS: ORAL_TABLET | ORAL | Status: DC

## 2012-05-29 NOTE — Progress Notes (Signed)
[facility] [id number] Progress Note  [name] [id number] 62 y.o.  05/29/2012 1:30 PM  Chief Complaint:  I had flu and I thought Lamictal was causing it.  I'm feeling better and not to start taking Lamictal 4 days ago.  History of Present Illness: Patient is 62 year-old Caucasian unemployed female who came for a follow up appointment.  She was given Lamictal however she started to have flulike symptoms and she was concerned about the Lamictal.  She is taking Lamictal 4 days ago.  She has not seen much improvement however she is less irritable from the past.  She is seeing therapists in this office.  She continued to endorse financial burden.  She's paying her heating bill in an installment.  She is hoping that her heating does not cutt off.  Recently she seen primary care physician  4 back pain.  She was given a lidocaine patch however she is not taking it due to expense.  She does not like outpatient office because most of the time she see resident and she is not happy with them.  She has chronic feelings of hopelessness and helplessness but denies any aggression violence or any recent active or passive suicidal thoughts.  She is taking Xanax 0.5 mg up to 3 times a day.  She has 2 refills remaining.  She is getting her antidepressant and Xanax from the primary care physician.  She is not drinking or using any illegal substance.  Her weight remains unchanged from the past.  Suicidal Ideation: No Plan Formed: No Patient has means to carry out plan: No  Homicidal Ideation: No Plan Formed: No Patient has means to carry out plan: No  Review of Systems: Psychiatric: Agitation: Irritable Hallucination: No Depressed Mood: No Insomnia: Yes Hypersomnia: No Altered Concentration: No Feels Worthless: No Grandiose Ideas: Yes Belief In Special Powers: No New/Increased Substance Abuse: No Compulsions: No  Neurologic: Headache: Yes Seizure: No Paresthesias: No  Medical  History:  Patient has a history of hyperlipidemia, migraine headache, hypertension and chronic back pain.  She see Dr. Criselda Peaches.  Family history. Patient endorsed sibling has psychiatric problem however she did not provide much information.  Psychosocial history. Patient was born in [place].  She moved to [place] wind knocked her she wanted to start a new life.  She's been married 3 times.  She has one daughter who lives in [place].  She admitted a difficult relationship with her daughter.  Patient endorses used to live she got divorced in 70s.  Patient lives by herself and with her dog.  Outpatient Encounter Prescriptions as of 05/29/2012  Medication Sig Dispense Refill  . ALPRAZolam (XANAX) 0.5 MG tablet Take 1 tablet (0.5 mg total) by mouth 3 (three) times daily as needed for sleep or anxiety (Please take 2 tablets at bedtime for sleep.).  120 tablet  3  . azelastine (ASTELIN) 137 MCG/SPRAY nasal spray Place 2 sprays into the nose 2 (two) times daily. Use in each nostril as directed  30 mL  2  . buPROPion (WELLBUTRIN XL) 300 MG 24 hr tablet Take 1 tablet (300 mg total) by mouth every morning.  30 tablet  6  . butalbital-acetaminophen-caffeine (FIORICET) 50-325-40 MG per tablet Take 1-2 tablets by mouth every 8 (eight) hours as needed for headache.  20 tablet  1  . escitalopram (LEXAPRO) 20 MG tablet Take 1 tablet (20 mg total) by mouth daily.  30 tablet  2  . lamoTRIgine (LAMICTAL) 25 MG tablet Take 2  tab daily  60 tablet  0  . omeprazole (PRILOSEC) 20 MG capsule Take 1 capsule (20 mg total) by mouth daily.  30 capsule  3  . ondansetron (ZOFRAN ODT) 4 MG disintegrating tablet Take 1 tablet (4 mg total) by mouth every 8 (eight) hours as needed for nausea (Place 1 tablet under the tongue).  20 tablet  0  . [DISCONTINUED] lamoTRIgine (LAMICTAL) 25 MG tablet Take 1 tab daily for 1 week and than 2 tab daily  60 tablet  0   No facility-administered encounter medications on file as of 05/29/2012.     Past Psychiatric History/Hospitalization(s): Patient has seen psychiatrist and therapist in [place] however did not like them.  In the past she had tried Prozac Effexor and trazodone. She denies any history of suicidal attempt. She denies any history of mania or psychosis. She is taking Wellbutrin and Lexapro which are prescribed initially by psychiatrists but then she start taking these medication from her primary care physician.   Anxiety: Yes Bipolar Disorder: No Depression: Yes Mania: No Psychosis: No Schizophrenia: No Personality Disorder: No Hospitalization for psychiatric illness: No History of Electroconvulsive Shock Therapy: No Prior Suicide Attempts: No  Physical Exam: Constitutional:  BP 120/70  Wt 174 lb (78.926 kg)  BMI 33.98 kg/m2  General Appearance: alert, oriented, no acute distress and well nourished  Musculoskeletal: Strength & Muscle Tone: within normal limits Gait & Station: normal Patient leans: N/A  Psychiatric: Speech (describe rate, volume, coherence, spontaneity, and abnormalities if any): Fast but clear and coherent.  Thought Process (describe rate, content, abstract reasoning, and computation): Logical and goal directed.  Associations: Relevant and Intact  Thoughts: normal  Mental Status: Orientation: oriented to person, place, time/date and situation Mood & Affect: depressed affect, anxiety and Constricted affect Attention Span & Concentration: Fair  Medical Decision Making (Choose Three): Established Problem, Stable/Improving (1), Review of Last Therapy Session (1) and Review of New Medication or Change in Dosage (2)  Assessment: Axis I: Depressive disorder NOS, rule out major depressive disorder  Axis II: Deferred  Axis III:  Patient Active Problem List   Diagnosis Date Noted  . Abnormality of gait 04/27/2012  . Major depressive disorder, recurrent episode, severe, without mention of psychotic behavior 04/22/2012  . Cannabis  abuse, unspecified 04/22/2012  . Financial difficulties 02/19/2012  . Chest pain, atypical 01/08/2012  . Routine adult health maintenance 11/06/2011  . Seasonal allergic reaction 11/06/2011  . Vegetarian diet 08/18/2011  . Diverticulosis 04/19/2011  . Weight gain 04/10/2011  . Metabolic syndrome 01/04/2011  . BACK PAIN, LUMBAR 06/17/2009  . HIP PAIN 06/02/2009  . HYPERLIPIDEMIA 02/20/2008  . HYPERTENSION, BENIGN ESSENTIAL 10/23/2007  . KNEE PAIN, BILATERAL 11/07/2006  . MENOPAUSAL SYNDROME 08/22/2006  . MIGRAINE VARIANTS, W/INTRACTABLE MIGRAINE 03/28/2006  . INSOMNIA 03/28/2006    Axis IV: Mild to moderate  Axis V: 55-65   Plan:  Since patient started taking Lamictal recently, I recommend that the change any dosage.  We will consider adjustment in the dosage on her next visit.  Recommend to continue Lexapro Wellbutrin and Xanax which is prescribed by primary care physician.  Recommended to see counselor regularly her coping and social skills.  This can benefit explains that he rash with Lamictal in that case she needed to stop the medication immediately and call us back.  I will see her in 6 weeks.  Chyane Greer T., MD 05/29/2012

## 2012-05-31 ENCOUNTER — Telehealth: Payer: Self-pay | Admitting: *Deleted

## 2012-05-31 NOTE — Telephone Encounter (Signed)
Pt calls and states her bp was slightly elevated at last visit, she takes claritin and wants to know if this could be the cause? Please advise

## 2012-06-04 ENCOUNTER — Telehealth (HOSPITAL_COMMUNITY): Payer: Self-pay | Admitting: Psychiatry

## 2012-06-04 ENCOUNTER — Telehealth (HOSPITAL_COMMUNITY): Payer: Self-pay | Admitting: *Deleted

## 2012-06-04 NOTE — Telephone Encounter (Signed)
Patient left VM: Told to take 1 tablet of new medicine for 7 days , then increase to 2 tablets daily. Yesterday, increased to 2 tablets. Developed a very itchy rash, like hives. Not going to take it anymore. "Don't care if you call me back or not"

## 2012-06-04 NOTE — Telephone Encounter (Signed)
Call return regarding medication side effects and left message.  Recommend to call us back.

## 2012-06-05 ENCOUNTER — Other Ambulatory Visit: Payer: Self-pay | Admitting: *Deleted

## 2012-06-05 ENCOUNTER — Ambulatory Visit (HOSPITAL_COMMUNITY): Payer: Self-pay | Admitting: Licensed Clinical Social Worker

## 2012-06-05 NOTE — Telephone Encounter (Signed)
Pt is out of medication.  She is asking for refill and possibility to change back to fiorinal.  She thinks that works better. Also wanted you to know Dr Lolly Mustache had given her a Rx for  Lamotrigine which she took for 7 days then developed a rash she was instructed to stop taking.

## 2012-06-06 ENCOUNTER — Other Ambulatory Visit: Payer: Self-pay | Admitting: *Deleted

## 2012-06-06 ENCOUNTER — Telehealth: Payer: Self-pay | Admitting: *Deleted

## 2012-06-06 MED ORDER — BUTALBITAL-APAP-CAFFEINE 50-325-40 MG PO TABS: 1.0000 | ORAL_TABLET | Freq: Three times a day (TID) | ORAL | Status: DC | PRN

## 2012-06-06 NOTE — Telephone Encounter (Signed)
Fiorcet rx called to [facility]. 

## 2012-06-06 NOTE — Telephone Encounter (Signed)
Pt had left message about Fiorcet refill - left message, rx was called to [facility] at 3:09PM.

## 2012-06-12 ENCOUNTER — Ambulatory Visit (HOSPITAL_COMMUNITY): Payer: Self-pay | Admitting: Licensed Clinical Social Worker

## 2012-06-12 ENCOUNTER — Encounter (HOSPITAL_COMMUNITY): Payer: Self-pay | Admitting: Licensed Clinical Social Worker

## 2012-06-12 NOTE — Progress Notes (Signed)
Patient ID: [name], female   DOB: 08-30-1950, 62 y.o.   MRN: [id number] Patient was a no show no call for appointment.

## 2012-06-18 ENCOUNTER — Telehealth (HOSPITAL_COMMUNITY): Payer: Self-pay

## 2012-06-18 NOTE — Telephone Encounter (Signed)
12:17pm 06/18/12 Returned pt's call - pt left msg on 05/27 at 10:17am - waiting for a call back/sh

## 2012-07-10 ENCOUNTER — Encounter (HOSPITAL_COMMUNITY): Payer: Self-pay | Admitting: Psychiatry

## 2012-07-10 ENCOUNTER — Ambulatory Visit (INDEPENDENT_AMBULATORY_CARE_PROVIDER_SITE_OTHER): Payer: Medicare Other | Admitting: Psychiatry

## 2012-07-10 VITALS — BP 149/73 | HR 88 | Wt 174.0 lb

## 2012-07-10 DIAGNOSIS — F32A Depression, unspecified: Secondary | ICD-10-CM

## 2012-07-10 DIAGNOSIS — F329 Major depressive disorder, single episode, unspecified: Secondary | ICD-10-CM

## 2012-07-10 DIAGNOSIS — F419 Anxiety disorder, unspecified: Secondary | ICD-10-CM

## 2012-07-10 MED ORDER — BUPROPION HCL ER (XL) 300 MG PO TB24: 300.0000 mg | ORAL_TABLET | ORAL | Status: DC

## 2012-07-10 MED ORDER — ESCITALOPRAM OXALATE 20 MG PO TABS: 20.0000 mg | ORAL_TABLET | Freq: Every day | ORAL | Status: DC

## 2012-07-10 NOTE — Progress Notes (Signed)
[name] [id number] Progress Note  [name] [id number] 62 y.o.  07/10/2012 1:55 PM  Chief Complaint:  I stopped taking Lamictal because of rash.  I don't want to try any medication.    History of Present Illness: Patient is 62 year-old Caucasian unemployed female who came for a follow up appointment.  She stopped taking because of rash.  She developed hives and got scared.  She's taking Lexapro and Wellbutrin.  She continues to have chronic depression and psychosocial stressors.  She endorse financial issues which she believe it will never get better.  However she is less irritable and less angry from the past.  She denies any recent crying spells.  She seeing therapist regularly.  She's getting Xanax from her primary care physician.  She denies any agitation anger or any mood swing.  She denies any active or passive suicidal thoughts.  She denied any side effects of Wellbutrin Lexapro.  Her weight and appetite is unchanged from the past.  She's not drinking or using any illegal substance.  Suicidal Ideation: No Plan Formed: No Patient has means to carry out plan: No  Homicidal Ideation: No Plan Formed: No Patient has means to carry out plan: No  Review of Systems: Psychiatric: Agitation: No Hallucination: No Depressed Mood: No Insomnia: No Hypersomnia: No Altered Concentration: No Feels Worthless: No Grandiose Ideas: No Belief In Special Powers: No New/Increased Substance Abuse: No Compulsions: No  Neurologic: Headache: Yes Seizure: No Paresthesias: No  Medical History:  Patient has a history of hyperlipidemia, migraine headache, hypertension and chronic back pain.  She see Dr. Criselda Peaches.  Family history. Patient endorsed sibling has psychiatric problem however she did not provide much information.  Psychosocial history. Patient was born in [place].  She moved to [place] wind knocked her she wanted to start a new life.  She's been married 3 times.  She  has one daughter who lives in [place].  She admitted a difficult relationship with her daughter.  Patient endorses used to live she got divorced in 45s.  Patient lives by herself and with her dog.  Outpatient Encounter Prescriptions as of 07/10/2012  Medication Sig Dispense Refill  . ALPRAZolam (XANAX) 0.5 MG tablet Take 1 tablet (0.5 mg total) by mouth 3 (three) times daily as needed for sleep or anxiety (Please take 2 tablets at bedtime for sleep.).  120 tablet  3  . buPROPion (WELLBUTRIN XL) 300 MG 24 hr tablet Take 1 tablet (300 mg total) by mouth every morning.  30 tablet  1  . escitalopram (LEXAPRO) 20 MG tablet Take 1 tablet (20 mg total) by mouth daily.  30 tablet  1  . omeprazole (PRILOSEC) 20 MG capsule Take 1 capsule (20 mg total) by mouth daily.  30 capsule  3  . [DISCONTINUED] buPROPion (WELLBUTRIN XL) 300 MG 24 hr tablet Take 1 tablet (300 mg total) by mouth every morning.  30 tablet  6  . [DISCONTINUED] escitalopram (LEXAPRO) 20 MG tablet Take 1 tablet (20 mg total) by mouth daily.  30 tablet  2  . azelastine (ASTELIN) 137 MCG/SPRAY nasal spray Place 2 sprays into the nose 2 (two) times daily. Use in each nostril as directed  30 mL  2  . butalbital-acetaminophen-caffeine (FIORICET) 50-325-40 MG per tablet Take 1-2 tablets by mouth every 8 (eight) hours as needed for headache.  20 tablet  1  . ondansetron (ZOFRAN ODT) 4 MG disintegrating tablet Take 1 tablet (4 mg total) by mouth every 8 (eight) hours  as needed for nausea (Place 1 tablet under the tongue).  20 tablet  0  . [DISCONTINUED] lamoTRIgine (LAMICTAL) 25 MG tablet Take 2 tab daily  60 tablet  0   No facility-administered encounter medications on file as of 07/10/2012.    Past Psychiatric History/Hospitalization(s): Patient has seen psychiatrist and therapist in [place] however did not like them.  In the past she had tried Prozac Effexor and trazodone. She denies any history of suicidal attempt. She denies any history of  mania or psychosis. She is taking Wellbutrin and Lexapro which are prescribed initially by psychiatrists but then she start taking these medication from her primary care physician.  He said he did try Lamictal but she developed side effects including hives and rash.  Anxiety: Yes Bipolar Disorder: No Depression: Yes Mania: No Psychosis: No Schizophrenia: No Personality Disorder: No Hospitalization for psychiatric illness: No History of Electroconvulsive Shock Therapy: No Prior Suicide Attempts: No  Physical Exam: Constitutional:  BP 149/73  Pulse 88  Wt 174 lb (78.926 kg)  BMI 33.98 kg/m2  General Appearance: alert, oriented, no acute distress and well nourished  Musculoskeletal: Strength & Muscle Tone: within normal limits Gait & Station: normal Patient leans: N/A  Psychiatric: Speech (describe rate, volume, coherence, spontaneity, and abnormalities if any): clear and coherent.  Thought Process (describe rate, content, abstract reasoning, and computation): Logical and goal directed.  Associations: Relevant and Intact  Thoughts: normal  Mental Status: Orientation: oriented to person, place, time/date and situation Mood & Affect: depressed affect and anxiety Attention Span & Concentration: Fair  Medical Decision Making (Choose Three): Established Problem, Stable/Improving (1), Review of Last Therapy Session (1) and Review of New Medication or Change in Dosage (2)  Assessment: Axis I: Depressive disorder NOS, rule out major depressive disorder  Axis II: Deferred  Axis III:  Patient Active Problem List   Diagnosis Date Noted  . Abnormality of gait 04/27/2012  . Major depressive disorder, recurrent episode, severe, without mention of psychotic behavior 04/22/2012  . Cannabis abuse, unspecified 04/22/2012  . Financial difficulties 02/19/2012  . Chest pain, atypical 01/08/2012  . Routine adult Price maintenance 11/06/2011  . Seasonal allergic reaction 11/06/2011   . Vegetarian diet 08/18/2011  . Diverticulosis 04/19/2011  . Weight gain 04/10/2011  . Metabolic syndrome 01/04/2011  . BACK PAIN, LUMBAR 06/17/2009  . HIP PAIN 06/02/2009  . HYPERLIPIDEMIA 02/20/2008  . HYPERTENSION, BENIGN ESSENTIAL 10/23/2007  . KNEE PAIN, BILATERAL 11/07/2006  . MENOPAUSAL SYNDROME 08/22/2006  . MIGRAINE VARIANTS, W/INTRACTABLE MIGRAINE 03/28/2006  . INSOMNIA 03/28/2006    Axis IV: Mild to moderate  Axis V: 55-65   Plan:  I will discontinue Lamictal .  We will give refill on her Wellbutrin and Lexapro she is taking regularly.  She's getting Xanax from her primary care physician.  Recommend to see counselor for coping skills.  Recommend to call us back it is a question of conservatively worsening of the symptom.  I will see her again in 2 months.  Patient does not want any new medication to be added. Tullio Chausse T., MD 07/10/2012

## 2012-07-11 ENCOUNTER — Ambulatory Visit (INDEPENDENT_AMBULATORY_CARE_PROVIDER_SITE_OTHER): Payer: Medicare Other | Admitting: Licensed Clinical Social Worker

## 2012-07-11 DIAGNOSIS — F332 Major depressive disorder, recurrent severe without psychotic features: Secondary | ICD-10-CM

## 2012-07-11 NOTE — Progress Notes (Signed)
   THERAPIST PROGRESS NOTE  Session Time: 1:00pm-1:50pm  Participation Level: Active  Behavioral Response: Fairly GroomedAlertDepressed, Hopeless and Irritable  Type of Therapy: Individual Therapy  Treatment Goals addressed: Coping  Interventions: CBT, Motivational Interviewing, Strength-based, Supportive and Reframing  Summary: [name] is a 62 y.o. female who presents with depressed mood and flat affect. Patient reports that her depression has worsened and she stays in bed most days. She endorses hopelessness and helplessness. She lacks motivation and does not believe that her life is worth much, so why bother trying. She denies any suicidal ideation, intent or plan because of her dogs. She spends her time inside caring for her dogs and others dogs. She has very little to no interaction with peers. She states that no one cares about her and that her own daughter won't even speak to her and she does not know why. She is taking her medication, but feels it is not working. She binge eats and takes OTC sleep medication to sleep more. She states "I don't really want any help".   Suicidal/Homicidal: Nowithout intent/plan  Therapist Response: Assessed patients current functioning and reviewed progress. Reviewed coping strategies. Assessed patients safety and assisted in identifying protective factors.  Reviewed crisis plan with patient. Assisted patient with the expression of sadness and irritability. Reviewed patients self care plan. Assessed progress related to self care. Patients self care is poor. Recommend daily exercise, increased socialization and recreation. Used CBT to assist patient with the identification of negative distortions and irrational thoughts. Encouraged patient to verbalize alternative and factual responses which challenge thought distortions. Used motivational interviewing to assist and encourage patient through the change process. Explored patients barriers to change.  Uncertain if patient will return based on her hopelessness.   Plan: Return again in four weeks.  Diagnosis: Axis I: Major Depression, Recurrent severe    Axis II: No diagnosis    Shaneal Barasch, LCSW 07/11/2012

## 2012-07-22 ENCOUNTER — Telehealth: Payer: Self-pay | Admitting: *Deleted

## 2012-07-22 NOTE — Telephone Encounter (Signed)
Past week pain right knee and to right hip and right back area. No change in appearance of right knee. Pt thinks it is more inside knee. Nothing helps. Hard to handle movement right knee - using stairs,etc. Ellwood Dense will call pt with appt this week. Stanton Kidney Avalynne Diver RN 07/22/12 3:30PM

## 2012-07-23 ENCOUNTER — Encounter: Payer: Self-pay | Admitting: Internal Medicine

## 2012-07-23 ENCOUNTER — Ambulatory Visit (INDEPENDENT_AMBULATORY_CARE_PROVIDER_SITE_OTHER): Payer: Medicare Other | Admitting: Internal Medicine

## 2012-07-23 VITALS — BP 126/74 | HR 90 | Temp 98.6°F | Wt 176.3 lb

## 2012-07-23 DIAGNOSIS — M25561 Pain in right knee: Secondary | ICD-10-CM

## 2012-07-23 DIAGNOSIS — M25569 Pain in unspecified knee: Secondary | ICD-10-CM

## 2012-07-23 NOTE — Progress Notes (Deleted)
  Subjective:    Patient ID: [name], female    DOB: 1950-07-10, 62 y.o.   MRN: [id number]  F/U of headaches, depression  HPI  [name] is a 62yo woman with PMH of HA, chronic pain, HLD, HDT and depression/anxiety who presents for follow up of these issues.   [name] has been seeing Psychiatry as requested.  This provider has advised the initiation of Lamictal for mood stabilization.  However, right at the start of taking the medications, it appears [name] developed acute gastroenteritis.  She has not started taking Lamictal again since the illness.  She does not believe that the lamictal caused her symptoms of gastroenteritis.    Lost 7 pounds during gi bug. Phenergan gave her maculopapular rash, improved.   Headaches are improved, still takes advil.  Doesn't think she needs fioricet anymore due to rebound headaches.  She is trying to not to take anything unless the pain gets to a "certain level."  The daily headaches are better.   Not taking medications every day.    Weight gain recently due to poor eating habits.  Worried about getting diabetes.  We discussed the risks of diabetes and discussed changes in diet that could help.   Still having a strained relationship with her daughter.  She is hopeless about this relationship.  Went to the kennel recently and saw a lot of old friends that she hadn't seen before.  This improved her mood.   Review of Systems  Constitutional: Negative for chills and fatigue.  Eyes: Negative for pain and visual disturbance.  Respiratory: Negative for shortness of breath.   Cardiovascular: Negative for chest pain.  Gastrointestinal:       Recently with GI bug and symptoms, resolved  Musculoskeletal: Positive for back pain. Negative for joint swelling.  Skin: Positive for rash. Negative for pallor.       With phenergan  Neurological: Positive for headaches. Negative for dizziness, seizures, syncope, weakness and light-headedness.        Objective:   Physical Exam  Constitutional: She is oriented to person, place, and time. She appears well-developed and well-nourished. No distress.  HENT:  Head: Normocephalic and atraumatic.  Eyes: Conjunctivae and EOM are normal. No scleral icterus.  Cardiovascular: Normal rate and regular rhythm.   Pulmonary/Chest: Effort normal and breath sounds normal. No respiratory distress. She has no wheezes.  Abdominal: Soft. Bowel sounds are normal.  Musculoskeletal: Normal range of motion. She exhibits no edema and no tenderness.  Neurological: She is alert and oriented to person, place, and time.  Skin: Skin is warm and dry. No erythema.  Psychiatric: Her behavior is normal. Thought content normal. Her mood appears not anxious. Her affect is not inappropriate. Her speech is rapid and/or pressured. Her speech is not slurred.  Pressured speech, interrupts regularly.    No labs.      Assessment & Plan:  RTC in 2-3 months.

## 2012-07-23 NOTE — Assessment & Plan Note (Signed)
Assessment:  The patient is lieley to have osteoarthritis of her right knee.  Plan:  We discussed options with the patient including changing NSAIDs, PT, and Sports Medicine referral. The patient is already taking NSAIDs and does not want to change her current regimen. The patient does not want to try tramadol as it causes her to have headaches. The patient does not want a referral to PT or sports medicine. The patient elected to use conservative measures including heat, ice, rest, and self-massage. We encouraged her to contact her PCP if the symptoms do not improve, continue to worsen, or she elects to have a referral to PT/Sorts medicine.

## 2012-07-23 NOTE — Progress Notes (Signed)
I saw this patient with Dr. Glendell Docker.  She has chronic regional pains of the back and knees.  The knee pain was evaluated in 2008 with xrays positive for DJD changes.  Current flare is poorly defined and not disabling.  No hint of trauma or infection.  Ultimately she preferred to await events rather than pursuing PT or consultation.  Agree with resident notes.

## 2012-07-23 NOTE — Progress Notes (Addendum)
  Subjective:    Patient ID: [name], female    DOB: 1950-06-06, 62 y.o.   MRN: [id number]   HPI  [name] is a 61yo woman with PMH of HA, chronic pain, HLD, HDT and depression/anxiety who presents for an acute visit for knee pain. The patient reports that she has had knee pain for the last 2 weeks. The pain has limited her mobility and caused her to fall after which she noticed increased pain.  Currently, the pain is localized to the quadriceps directly above the knee. She rates the pain at 5/10. She describes the pain as a tightness within and above her knee. The patient radiates up to her lateral hip and into her lumbar spine. The patient has tried rest, advil, and aleve with no improvement of symptoms. The patient denies swelling, temperature changes, and erythema.   Review of Systems  Constitutional: Negative for chills and fatigue.  Eyes: Negative for pain and visual disturbance.  Respiratory: Negative for shortness of breath.   Cardiovascular: Negative for chest pain.  Gastrointestinal: denies N/V diarrhea and constipation. Musculoskeletal: See HPI. Positive for back pain. Negative for joint swelling.  Skin: No rash Neurological: Positive for headache. Negative for dizziness, seizures, syncope, weakness and light-headedness.       Objective:   Physical Exam  Constitutional: She is oriented to person, place, and time. She appears well-developed and well-nourished. No distress.  HEENT: PERRL, Conjunctivae and EOM are normal. No scleral icterus.  Head: Normocephalic and atraumatic.  Cardiovascular: Normal rate and regular rhythm.   Pulmonary/Chest: Effort normal and breath sounds normal. No respiratory distress. She has no wheezes.  Abdominal: Soft. Bowel sounds are normal.  Musculoskeletal: Right knee exhibits normal range of motion, no erythema, no swelling, not warm. She exhibits no edema and no tenderness.  Neurological: She is alert and oriented to person,  place, and time.  Skin: Skin is warm and dry. No erythema.  Psychiatric: Her behavior is normal. Thought content normal. Her mood appears not anxious. Her affect is appropriate.  Her speech is not slurred. Pressured speech, interrupts regularly.

## 2012-07-23 NOTE — Patient Instructions (Addendum)
Follow up wth PCP in 1-2 months.   Continue Advil as needed for knee pain.   Osteoarthritis Osteoarthritis is the most common form of arthritis. It is redness, soreness, and swelling (inflammation) affecting the cartilage. Cartilage acts as a cushion, covering the ends of bones where they meet to form a joint. CAUSES  Over time, the cartilage begins to wear away. This causes bone to rub on bone. This produces pain and stiffness in the affected joints. Factors that contribute to this problem are:  Excessive body weight.  Age.  Overuse of joints. SYMPTOMS   People with osteoarthritis usually experience joint pain, swelling, or stiffness.  Over time, the joint may lose its normal shape.  Small deposits of bone (osteophytes) may grow on the edges of the joint.  Bits of bone or cartilage can break off and float inside the joint space. This may cause more pain and damage.  Osteoarthritis can lead to depression, anxiety, feelings of helplessness, and limitations on daily activities. The most commonly affected joints are in the:  Ends of the fingers.  Thumbs.  Neck.  Lower back.  Knees.  Hips. DIAGNOSIS  Diagnosis is mostly based on your symptoms and exam. Tests may be helpful, including:  X-rays of the affected joint.  A computerized magnetic scan (MRI).  Blood tests to rule out other types of arthritis.  Joint fluid tests. This involves using a needle to draw fluid from the joint and examining the fluid under a microscope. TREATMENT  Goals of treatment are to control pain, improve joint function, maintain a normal body weight, and maintain a healthy lifestyle. Treatment approaches may include:  A prescribed exercise program with rest and joint relief.  Weight control with nutritional education.  Pain relief techniques such as:  Properly applied heat and cold.  Electric pulses delivered to nerve endings under the skin (transcutaneous electrical nerve stimulation,  TENS).  Massage.  Certain supplements. Ask your caregiver before using any supplements, especially in combination with prescribed drugs.  Medicines to control pain, such as:  Acetaminophen.  Nonsteroidal anti-inflammatory drugs (NSAIDs), such as naproxen.  Narcotic or central-acting agents, such as tramadol. This drug carries a risk of addiction and is generally prescribed for short-term use.  Corticosteroids. These can be given orally or as injection. This is a short-term treatment, not recommended for routine use.  Surgery to reposition the bones and relieve pain (osteotomy) or to remove loose pieces of bone and cartilage. Joint replacement may be needed in advanced states of osteoarthritis. HOME CARE INSTRUCTIONS  Your caregiver can recommend specific types of exercise. These may include:  Strengthening exercises. These are done to strengthen the muscles that support joints affected by arthritis. They can be performed with weights or with exercise bands to add resistance.  Aerobic activities. These are exercises, such as brisk walking or low-impact aerobics, that get your heart pumping. They can help keep your lungs and circulatory system in shape.  Range-of-motion activities. These keep your joints limber.  Balance and agility exercises. These help you maintain daily living skills. Learning about your condition and being actively involved in your care will help improve the course of your osteoarthritis. SEEK MEDICAL CARE IF:   You feel hot or your skin turns red.  You develop a rash in addition to your joint pain.  You have an oral temperature above 102 F (38.9 C). FOR MORE INFORMATION  National Institute of Arthritis and Musculoskeletal and Skin Diseases: www.niams.http://www.myers.net/ [employer] on Aging: https://walker.com/ American College  of Rheumatology: www.rheumatology.org Document Released: 01/09/2005 Document Revised: 04/03/2011 Document Reviewed: 04/22/2009 [facility]  Patient Information 2014 [place], [place]. Knee Pain The knee is the complex joint between your thigh and your lower leg. It is made up of bones, tendons, ligaments, and cartilage. The bones that make up the knee are:  The femur in the thigh.  The tibia and fibula in the lower leg.  The patella or kneecap riding in the groove on the lower femur. CAUSES  Knee pain is a common complaint with many causes. A few of these causes are:  Injury, such as:  A ruptured ligament or tendon injury.  Torn cartilage.  Medical conditions, such as:  Gout  Arthritis  Infections  Overuse, over training or overdoing a physical activity. Knee pain can be minor or severe. Knee pain can accompany debilitating injury. Minor knee problems often respond well to self-care measures or get well on their own. More serious injuries may need medical intervention or even surgery. SYMPTOMS The knee is complex. Symptoms of knee problems can vary widely. Some of the problems are:  Pain with movement and weight bearing.  Swelling and tenderness.  Buckling of the knee.  Inability to straighten or extend your knee.  Your knee locks and you cannot straighten it.  Warmth and redness with pain and fever.  Deformity or dislocation of the kneecap. DIAGNOSIS  Determining what is wrong may be very straight forward such as when there is an injury. It can also be challenging because of the complexity of the knee. Tests to make a diagnosis may include:  Your caregiver taking a history and doing a physical exam.  Routine X-rays can be used to rule out other problems. X-rays will not reveal a cartilage tear. Some injuries of the knee can be diagnosed by:  Arthroscopy a surgical technique by which a small video camera is inserted through tiny incisions on the sides of the knee. This procedure is used to examine and repair internal knee joint problems. Tiny instruments can be used during arthroscopy to repair the torn  knee cartilage (meniscus).  Arthrography is a radiology technique. A contrast liquid is directly injected into the knee joint. Internal structures of the knee joint then become visible on X-ray film.  An MRI scan is a non x-ray radiology procedure in which magnetic fields and a computer produce two- or three-dimensional images of the inside of the knee. Cartilage tears are often visible using an MRI scanner. MRI scans have largely replaced arthrography in diagnosing cartilage tears of the knee.  Blood work.  Examination of the fluid that helps to lubricate the knee joint (synovial fluid). This is done by taking a sample out using a needle and a syringe. TREATMENT The treatment of knee problems depends on the cause. Some of these treatments are:  Depending on the injury, proper casting, splinting, surgery or physical therapy care will be needed.  Give yourself adequate recovery time. Do not overuse your joints. If you begin to get sore during workout routines, back off. Slow down or do fewer repetitions.  For repetitive activities such as cycling or running, maintain your strength and nutrition.  Alternate muscle groups. For example if you are a weight lifter, work the upper body on one day and the lower body the next.  Either tight or weak muscles do not give the proper support for your knee. Tight or weak muscles do not absorb the stress placed on the knee joint. Keep the muscles surrounding the knee  strong.  Take care of mechanical problems.  If you have flat feet, orthotics or special shoes may help. See your caregiver if you need help.  Arch supports, sometimes with wedges on the inner or outer aspect of the heel, can help. These can shift pressure away from the side of the knee most bothered by osteoarthritis.  A brace called an "unloader" brace also may be used to help ease the pressure on the most arthritic side of the knee.  If your caregiver has prescribed crutches, braces,  wraps or ice, use as directed. The acronym for this is PRICE. This means protection, rest, ice, compression and elevation.  Nonsteroidal anti-inflammatory drugs (NSAID's), can help relieve pain. But if taken immediately after an injury, they may actually increase swelling. Take NSAID's with food in your stomach. Stop them if you develop stomach problems. Do not take these if you have a history of ulcers, stomach pain or bleeding from the bowel. Do not take without your caregiver's approval if you have problems with fluid retention, heart failure, or kidney problems.  For ongoing knee problems, physical therapy may be helpful.  Glucosamine and chondroitin are over-the-counter dietary supplements. Both may help relieve the pain of osteoarthritis in the knee. These medicines are different from the usual anti-inflammatory drugs. Glucosamine may decrease the rate of cartilage destruction.  Injections of a corticosteroid drug into your knee joint may help reduce the symptoms of an arthritis flare-up. They may provide pain relief that lasts a few months. You may have to wait a few months between injections. The injections do have a small increased risk of infection, water retention and elevated blood sugar levels.  Hyaluronic acid injected into damaged joints may ease pain and provide lubrication. These injections may work by reducing inflammation. A series of shots may give relief for as long as 6 months.  Topical painkillers. Applying certain ointments to your skin may help relieve the pain and stiffness of osteoarthritis. Ask your pharmacist for suggestions. Many over the-counter products are approved for temporary relief of arthritis pain.  In some countries, doctors often prescribe topical NSAID's for relief of chronic conditions such as arthritis and tendinitis. A review of treatment with NSAID creams found that they worked as well as oral medications but without the serious side  effects. PREVENTION  Maintain a healthy weight. Extra pounds put more strain on your joints.  Get strong, stay limber. Weak muscles are a common cause of knee injuries. Stretching is important. Include flexibility exercises in your workouts.  Be smart about exercise. If you have osteoarthritis, chronic knee pain or recurring injuries, you may need to change the way you exercise. This does not mean you have to stop being active. If your knees ache after jogging or playing basketball, consider switching to swimming, water aerobics or other low-impact activities, at least for a few days a week. Sometimes limiting high-impact activities will provide relief.  Make sure your shoes fit well. Choose footwear that is right for your sport.  Protect your knees. Use the proper gear for knee-sensitive activities. Use kneepads when playing volleyball or laying carpet. Buckle your seat belt every time you drive. Most shattered kneecaps occur in car accidents.  Rest when you are tired. SEEK MEDICAL CARE IF:  You have knee pain that is continual and does not seem to be getting better.  SEEK IMMEDIATE MEDICAL CARE IF:  Your knee joint feels hot to the touch and you have a high fever. MAKE SURE YOU:  Understand these instructions.  Will watch your condition.  Will get help right away if you are not doing well or get worse. Document Released: 11/06/2006 Document Revised: 04/03/2011 Document Reviewed: 11/06/2006 [facility] Patient Information 2014 [place], [place].

## 2012-07-24 ENCOUNTER — Telehealth: Payer: Self-pay | Admitting: Internal Medicine

## 2012-07-24 ENCOUNTER — Telehealth: Payer: Self-pay | Admitting: *Deleted

## 2012-07-24 DIAGNOSIS — M25561 Pain in right knee: Secondary | ICD-10-CM

## 2012-07-24 MED ORDER — SULINDAC 150 MG PO TABS: 150.0000 mg | ORAL_TABLET | Freq: Two times a day (BID) | ORAL | Status: DC

## 2012-07-24 NOTE — Telephone Encounter (Signed)
Pt called and is asking for medication to relieve knee pain.   She was seen in clinic yesterday for this same pain and was advised to try Advil.  She has taken 800 mg tid today without relief. She wants something to help her over the week end.  Pt # [id number] Please advise.

## 2012-07-24 NOTE — Telephone Encounter (Signed)
  Reason for call:  I placed an outgoing call to [name] at 5:00 PM regarding her recent visit. The patient was seen yesterday for knee pain. During that visit, the patient stated that she did not want to be prescribed medications that are habit-forming as she has an addictive personality. Also, she stated that she did not like opiates as they make her feel poorly. Furthermore, she stated that she did not want to be prescribed tramadol as it gives her migraines.  The patent also stated that she has not tried and will not try acetaminophen as it does not work for her. The patient was offered a referral to either PT or Sports Medicine, but she refused these options. The patient agreed to conservative measures including OTC advil, warming pads, and ice. Today, the patient states that her pain has not improved and her knee buckled while walking up the stairs.  The patient stated that she was unsatisfied with the treatment plan and requested a phoned-in prescription for a pain medicine. I discussed at length with the patient pain medications that she could try for her pain. This included tylenol, naproxen, and tramadol. The patient was was unsatisfied with these options.  I elected to call the patient back after talking with an attending physician, Dr. Aundria Rud. Dr. Aundria Rud and I agreed to suggest that the patient take sulindac as a bridge until she could see a sports medicine specialist. I made a second call to the patient to discuss this plan, and the patient was unsatisfied with this option. The patient expressed interest in having an opiate prescription called in to her pharmacy. I informed the patient that this was not an option, and that she would need to be seen by a physician in order for a narcotic to be prescribed. The patient was unsatisfied with this explanation. The patient requested to have a follow-up appointment with her PCP and hung up the phone.   Assessment/ Plan:   I plan to order  sulindac for the patient. Further, I plan to make the patient a referral to sports medicine ASAP. Also, I plan to schedule follow-up for the patient with her PCP.  As always, pt is advised that if symptoms worsen or new symptoms arise, they should go to an urgent care facility or to to ER for further evaluation.   Pleas Koch, MD   07/24/2012, 5:22 PM

## 2012-07-25 NOTE — Telephone Encounter (Signed)
I discussed this phone encounter with Dr. Glendell Docker at the time.  I agree with his plan.

## 2012-07-25 NOTE — Telephone Encounter (Signed)
MD called pt.

## 2012-08-08 ENCOUNTER — Ambulatory Visit (INDEPENDENT_AMBULATORY_CARE_PROVIDER_SITE_OTHER): Payer: Medicare Other | Admitting: Licensed Clinical Social Worker

## 2012-08-08 DIAGNOSIS — F332 Major depressive disorder, recurrent severe without psychotic features: Secondary | ICD-10-CM

## 2012-08-08 NOTE — Progress Notes (Signed)
   THERAPIST PROGRESS NOTE  Session Time: 1:00pm-1:50pm  Participation Level: Active  Behavioral Response: Well GroomedAlertDepressed, Hopeless and Irritable  Type of Therapy: Individual Therapy  Treatment Goals addressed: Coping  Interventions: CBT, Motivational Interviewing, Strength-based, Supportive and Reframing  Summary: [name] is a 62 y.o. female who presents with depressed mood and irritable affect. She reports worsening depression, increased agitation and hopelessness. She reports increased back pain and is angry that she is not receiving the help she needs. She endorses and demonstrates apathy and says she is just waiting to die. She does not try to kill herself because of her dogs and identifies her daughter and grand children as protective factors. She feels rejected by her daughter who continues to refuse contact. Patient believes that no one cares about her and has strong negative self talk. She remains inside all day and only leaves the house to walk her dogs. Her sleep has increased and her appetite is increased with binge eating.   Suicidal/Homicidal: Nowithout intent/plan  Therapist Response: Assessed patients current functioning and reviewed progress. Reviewed coping strategies. Assessed patients safety and assisted in identifying protective factors.  Reviewed crisis plan with patient. Assisted patient with the expression of sadness and anger. Reviewed patients self care plan. Assessed progress related to self care. Patients self care is poor. Recommend daily exercise, increased socialization and recreation. Used CBT to assist patient with the identification of negative distortions and irrational thoughts. Encouraged patient to verbalize alternative and factual responses which challenge thought distortions. Used motivational interviewing to assist and encourage patient through the change process. Explored patients barriers to change.   Plan: Return again in two  weeks.  Diagnosis: Axis I: Major Depression, Recurrent severe    Axis II: No diagnosis    Trason Shifflet, LCSW 08/08/2012

## 2012-08-09 ENCOUNTER — Other Ambulatory Visit: Payer: Self-pay | Admitting: Internal Medicine

## 2012-08-13 ENCOUNTER — Ambulatory Visit (INDEPENDENT_AMBULATORY_CARE_PROVIDER_SITE_OTHER): Payer: Medicare Other | Admitting: Family Medicine

## 2012-08-13 VITALS — BP 130/86 | Ht 60.0 in | Wt 173.0 lb

## 2012-08-13 DIAGNOSIS — M25561 Pain in right knee: Secondary | ICD-10-CM

## 2012-08-13 DIAGNOSIS — R29898 Other symptoms and signs involving the musculoskeletal system: Secondary | ICD-10-CM

## 2012-08-13 DIAGNOSIS — M25569 Pain in unspecified knee: Secondary | ICD-10-CM

## 2012-08-13 MED ORDER — MELOXICAM 15 MG PO TABS: 15.0000 mg | ORAL_TABLET | Freq: Every day | ORAL | Status: DC

## 2012-08-13 NOTE — Progress Notes (Signed)
CC: Right knee pain HPI: The patient is a pleasant 62 year old female who presents with right knee pain. Patient states that right knee has been bothering her since she fell mowing the lawn a couple weeks ago. Patient says that she falls a lot up to several times per week. She has been having falls for about 2-3 years but more recently it has been happening multiple times per week. She says that when this happens her knee gives out on her. She feels like she has no strength in the knee. Patient states that she also has to manually lift her leg in order to be able to get into her car. Patient states that she had difficulty getting up after her fall with the lawn mower. She saw her primary care doctor who recommended that she take NSAIDs but she desired a second opinion from another physician. She therefore presents today for evaluation. Patient does say her knee pain has improved some since her last visit.  ROS: As above in the HPI. All other systems are stable or negative.  PMH: Includes depression, anxiety, headaches, and back pain  Social: Patient is retired and on disability. She does not smoke or drink alcohol.  Family: Patient has a half-sister with diabetes and a brother with high blood pressure.  Allergies: Penicillin    OBJECTIVE: APPEARANCE:  Patient in no acute distress.The patient appeared well nourished and normally developed. HEENT: No scleral icterus. Conjunctiva non-injected Resp: Non labored Skin: No rash MSK:  Back Exam: - FROM in flexion, extension, lateral bending, rotation - No TTP at midline spinous processes - No TTP in low back musculature, SI joint, sciatic notch - SLR is negative - Strength is decreased to 4/5 on hip flexion on the right. Strength otherwise 5 out of 5 in bilateral lower extremities - Reflexes 2+ bilaterally at knee and achilles - FABERs negative - Neurovasc intact  Right Knee - Inspection normal with no erythema or effusion or obvious bony  abnormalities.  - Palpation normal with no warmth or joint line tenderness. No TTP at patellar or quad tendon.  - ROM normal in flexion and extension. - Strength 5/5 in flexion and extension. - Ligaments with solid consistent endpoints including ACL, PCL, LCL, MCL.  - Negative Mcmurray's.  - Non painful patellar compression.  - Neurovascularly intact   ASSESSMENT: Weakness on right hip flexion with multiple falls concern for lumbar nerve injury. Lumbar spine films from 3 years ago are reviewed and showed severe degenerative disc disease at L4-L5 as well as a mild anterolisthesis at L3-L4.  PLAN: At this time, based on the patient's duration of symptoms as well as her weakness on exam I think it is important to fully evaluate this patient to reduce chance of permanent neurologic injury. We will obtain lumbar spine films and MRI of the lumbar spine. Patient may continue to use NSAIDs for pain relief. She is prescribed meloxicam today. Also recommended use of ice for any knee pain. Suspect her knee pain will continue to improve. Suspect knee pain is either referred radicular pain versus injury with a fall and anticipate full resolution. Knee exam very reassuring today.

## 2012-08-13 NOTE — Patient Instructions (Signed)
For leg weakness, we will get xrays and MRI of your back. This is concerning because you are falling.  For knee pain, there is nothing wrong on exam today. If you have knee pain, try meloxicam. Also try ice and compression.

## 2012-08-16 ENCOUNTER — Telehealth: Payer: Self-pay | Admitting: *Deleted

## 2012-08-16 NOTE — Telephone Encounter (Signed)
Cancelled MRI appt due to not having money at this time

## 2012-08-19 ENCOUNTER — Other Ambulatory Visit: Payer: Self-pay | Admitting: Internal Medicine

## 2012-08-19 NOTE — Telephone Encounter (Signed)
[name],   Where did this refill request originate?  Dr. [name] last note says the patient wanted to stop this med.

## 2012-08-20 ENCOUNTER — Other Ambulatory Visit: Payer: Self-pay

## 2012-08-20 NOTE — Telephone Encounter (Signed)
[name],  As I wrote [name] yesterday:  I cannot tell whether Dr. Criselda Peaches intended to continue this medication.  Can this be routed to her?

## 2012-08-21 ENCOUNTER — Encounter: Payer: Self-pay | Admitting: Internal Medicine

## 2012-08-21 MED ORDER — HYDROCODONE-ACETAMINOPHEN 5-325 MG PO TABS: 1.0000 | ORAL_TABLET | Freq: Three times a day (TID) | ORAL | Status: DC | PRN

## 2012-08-21 NOTE — Telephone Encounter (Addendum)
[name] confirmed MRi appt 8/6. Therefore will Rx 10 hydrocodone (has gotten before 2013) and stress NO REFILLS WHAT SO EVER without an appointment. She needs to make the 10 last (combine with NSAID, heat, massage, and stretching) until the appointment. After MRI, she will still need appt to get any more pain meds. This is NOT a guarantee that the hydrocodone will be refilled.   No FYI regarding pain meds on chart. Did not run [place] narc database since acute issue and not chronic pain med mgmt

## 2012-08-21 NOTE — Telephone Encounter (Signed)
I just called pt. Ongoing knee / leg pain. Tried three NSAID's without relief. Told me she has MRI sch but after I hung up, I saw note from 7/25 that she had cancelled MRI 2/2 no money. Is there anyway to verify the MRI? I will not call in anything if she cancelled the MRI after reading Sports MEd note. IF she didn't cancel it, I will call in short supply.

## 2012-08-21 NOTE — Telephone Encounter (Signed)
Called to pharm. Attempted to call pt no answer, left vmail

## 2012-08-26 ENCOUNTER — Encounter: Payer: Self-pay | Admitting: Internal Medicine

## 2012-08-26 ENCOUNTER — Other Ambulatory Visit: Payer: Self-pay | Admitting: Internal Medicine

## 2012-08-26 ENCOUNTER — Ambulatory Visit (INDEPENDENT_AMBULATORY_CARE_PROVIDER_SITE_OTHER): Payer: Medicare Other | Admitting: Internal Medicine

## 2012-08-26 VITALS — BP 123/70 | HR 80 | Temp 97.6°F | Ht 60.0 in | Wt 184.6 lb

## 2012-08-26 DIAGNOSIS — M545 Low back pain, unspecified: Secondary | ICD-10-CM

## 2012-08-26 DIAGNOSIS — F332 Major depressive disorder, recurrent severe without psychotic features: Secondary | ICD-10-CM

## 2012-08-26 DIAGNOSIS — I10 Essential (primary) hypertension: Secondary | ICD-10-CM

## 2012-08-26 LAB — BASIC METABOLIC PANEL WITH GFR
CO2: 28 mEq/L (ref 19–32)
Calcium: 9.6 mg/dL (ref 8.4–10.5)
Chloride: 100 mEq/L (ref 96–112)
Creat: 0.74 mg/dL (ref 0.50–1.10)
Glucose, Bld: 94 mg/dL (ref 70–99)

## 2012-08-26 MED ORDER — HYDROCODONE-ACETAMINOPHEN 5-325 MG PO TABS: 1.0000 | ORAL_TABLET | Freq: Two times a day (BID) | ORAL | Status: DC | PRN

## 2012-08-26 NOTE — Patient Instructions (Addendum)
General Instructions: Please schedule a follow up visit within the next 3-4 months or as needed.   For your medications:   Please bring all of your pill  Bottles with you to each visit.  This will help make sure that we have an up to date list of all the medications you are taking.  Please also bring any over the counter herbal medications you are taking (not including advil, tylenol, etc.)  Please continue taking your medications as prescribed. You will have a refill of Vicodin for 20 pills, which you can take 2 times per day.  Please go to your MRI appointment to check for problems in your back.   If an appointment was not made for you, please call Dr. Riley Kill office (sports medicine) to make a follow up appointment after the MRI.   Thank you!   Treatment Goals:  Goals (1 Years of Data) as of 08/26/12     Lifestyle    . Prevent Falls       Progress Toward Treatment Goals:  Treatment Goal 08/26/2012  Blood pressure at goal  Prevent falls unchanged    Self Care Goals & Plans:  Self Care Goal 07/23/2012  Manage my medications refill my medications on time; take my medicines as prescribed  Monitor my health keep track of my blood glucose; keep track of my blood pressure  Eat healthy foods eat baked foods instead of fried foods; eat foods that are low in salt  Be physically active find an activity I enjoy  Prevent falls use home fall prevention checklist to improve safety       Care Management & Community Referrals:  Referral 08/26/2012  Referrals made for care management support none needed  Referrals made to community resources -

## 2012-08-27 ENCOUNTER — Encounter: Payer: Self-pay | Admitting: Internal Medicine

## 2012-08-27 NOTE — Assessment & Plan Note (Signed)
She is following with sports medicine.  MRI planned for Wednesday.   She notes that the only thing helping her sleep right now is Vicodin.  Will refill with 20 tabs in anticipation of sports medicine follow up after MRI.

## 2012-08-27 NOTE — Progress Notes (Signed)
Subjective:    Patient ID: [name], female    DOB: 03-14-1950, 62 y.o.   MRN: [id number]  CC: Follow up leg pain, chronic health issues  HPI  [name] is a 61yo woman with PMH of depression, migraine and HTN who presents to follow up for R leg pain.  She notes that the leg pain started a few weeks ago and seems to be temporally related to falling while mowing the lawn.  She has also had R leg weakness, particularly in the quad muscles, when trying to stand.  It waxes and wanes and is worse in certain positions.  She also has knee pain which seems to be associated with the leg pain and back pain, which is a worsening of her chronic pain. She has tried 3 types of NSAIDs and tylenol without relief.  She was given vicodin by the clinic and this helped her sleep somewhat.  She is due for an MRI on Wednesday.   As concerns her migraines, they are improved and not currently an issue.   For her depression, she is following with a psychiatrist and counselor.  This seems to be improving and she does like her counselor.  She was tried on Lamictal, but developed a rash.  Her main issue currently is compulsive eating and weight gain.  We discussed multiple strategies for her to decrease her portions of high calorie foods such as ice cream.   She is not smoking.   Current Outpatient Prescriptions on File Prior to Visit  Medication Sig Dispense Refill  . ALPRAZolam (XANAX) 0.5 MG tablet TAKE ONE TABLET BY MOUTH THREE TIMES DAILY AS NEEDED FOR SLEEP OR ANXIETY (2 TABLETS AT BEDTIME FOR SLEEP)  120 tablet  0  . azelastine (ASTELIN) 137 MCG/SPRAY nasal spray Place 2 sprays into the nose 2 (two) times daily. Use in each nostril as directed  30 mL  2  . buPROPion (WELLBUTRIN XL) 300 MG 24 hr tablet Take 1 tablet (300 mg total) by mouth every morning.  30 tablet  1  . butalbital-acetaminophen-caffeine (FIORICET) 50-325-40 MG per tablet Take 1-2 tablets by mouth every 8 (eight) hours as needed for  headache.  20 tablet  1  . escitalopram (LEXAPRO) 20 MG tablet Take 1 tablet (20 mg total) by mouth daily.  30 tablet  1  . meloxicam (MOBIC) 15 MG tablet Take 1 tablet (15 mg total) by mouth daily.  30 tablet  2  . omeprazole (PRILOSEC) 20 MG capsule Take 1 capsule (20 mg total) by mouth daily.  30 capsule  3  . ondansetron (ZOFRAN ODT) 4 MG disintegrating tablet Take 1 tablet (4 mg total) by mouth every 8 (eight) hours as needed for nausea (Place 1 tablet under the tongue).  20 tablet  0    Review of Systems  Constitutional: Positive for activity change (due to leg pain). Negative for fever, appetite change, fatigue and unexpected weight change.  HENT: Negative for hearing loss and ear pain.   Eyes: Negative for photophobia and pain.  Respiratory: Negative for cough, chest tightness and shortness of breath.   Cardiovascular: Negative for chest pain and leg swelling.  Gastrointestinal: Negative for nausea, abdominal pain and diarrhea.  Endocrine: Positive for polyphagia (compulsive). Negative for polydipsia.  Genitourinary: Negative for dysuria and difficulty urinating.  Musculoskeletal: Positive for back pain, arthralgias and gait problem. Negative for joint swelling.  Skin: Negative for pallor and rash.  Neurological: Negative for dizziness and headaches.  Psychiatric/Behavioral: Positive for  dysphoric mood (chronic but improved). The patient is nervous/anxious (chronic, on xanax).        Objective:   Physical Exam  Nursing note and vitals reviewed. Constitutional: She is oriented to person, place, and time. She appears well-developed and well-nourished. No distress.  Obese - has gained 8 pounds in 1 month.   HENT:  Head: Normocephalic and atraumatic.  Eyes: Conjunctivae are normal. No scleral icterus.  Neck: Neck supple.  Cardiovascular: Normal rate, regular rhythm, normal heart sounds and intact distal pulses.   No murmur heard. Pulmonary/Chest: Breath sounds normal. No  respiratory distress. She has no wheezes.  Abdominal: Soft. Bowel sounds are normal.  Musculoskeletal:       Right knee: She exhibits normal range of motion and no swelling. No tenderness found.       Left knee: She exhibits normal range of motion and no swelling. No tenderness found.  Lymphadenopathy:    She has no cervical adenopathy.  Neurological: She is alert and oriented to person, place, and time.  She has 4/5 strength in hip flexion on the right, normal on the left.  Strength is otherwise intact.  No sensation changes.   Skin: Skin is warm and dry. No erythema.  Psychiatric: She has a normal mood and affect. Her behavior is normal. Thought content normal.     BMP today was WNL     Assessment & Plan:  RTC in 3 months and prn

## 2012-08-27 NOTE — Assessment & Plan Note (Signed)
BP Readings from Last 3 Encounters:  08/26/12 123/70  08/13/12 130/86  07/23/12 126/74    Lab Results  Component Value Date   NA 136 08/26/2012   K 4.1 08/26/2012   CREATININE 0.74 08/26/2012    Assessment: Blood pressure control: controlled Progress toward BP goal:  at goal   Plan: Medications:  continue current medications Educational resources provided: brochure Self management tools provided: home blood pressure logbook

## 2012-08-27 NOTE — Assessment & Plan Note (Signed)
Her mood seems somewhat improved today.  She has not had any change in her medications, but has been consistently following up with her psychiatrist and counselor.  I congratulated her efforts today.  We discussed at length her eating and portion control to help her maintain a health weight.

## 2012-08-28 ENCOUNTER — Ambulatory Visit
Admission: RE | Admit: 2012-08-28 | Discharge: 2012-08-28 | Disposition: A | Discharge status: Home / Self Care | Payer: Medicare Other | Source: Ambulatory Visit | Attending: Family Medicine | Admitting: Family Medicine

## 2012-08-28 DIAGNOSIS — R29898 Other symptoms and signs involving the musculoskeletal system: Secondary | ICD-10-CM

## 2012-09-04 ENCOUNTER — Ambulatory Visit (INDEPENDENT_AMBULATORY_CARE_PROVIDER_SITE_OTHER): Payer: Medicare Other | Admitting: Sports Medicine

## 2012-09-04 VITALS — BP 128/80 | Ht 60.0 in | Wt 173.0 lb

## 2012-09-04 DIAGNOSIS — R29898 Other symptoms and signs involving the musculoskeletal system: Secondary | ICD-10-CM

## 2012-09-04 DIAGNOSIS — M25569 Pain in unspecified knee: Secondary | ICD-10-CM

## 2012-09-04 DIAGNOSIS — IMO0002 Reserved for concepts with insufficient information to code with codable children: Secondary | ICD-10-CM

## 2012-09-04 DIAGNOSIS — M5416 Radiculopathy, lumbar region: Secondary | ICD-10-CM

## 2012-09-04 DIAGNOSIS — M25561 Pain in right knee: Secondary | ICD-10-CM

## 2012-09-04 MED ORDER — PREDNISONE (PAK) 10 MG PO TABS: 10.0000 mg | ORAL_TABLET | Freq: Every day | ORAL | Status: DC

## 2012-09-04 MED ORDER — MELOXICAM 15 MG PO TABS: ORAL_TABLET | ORAL | Status: DC

## 2012-09-04 NOTE — Patient Instructions (Signed)
Thank you for coming in today.  We will get you scheduled with the spine surgeon. Take the prednisone as instructed. Continue meloxicam

## 2012-09-04 NOTE — Progress Notes (Signed)
CC: Followup of right leg pain and weakness as well as MRI results HPI: Patient is a very pleasant 62 year old female who presents for followup today. When I last saw her she was complaining of knee pain after falling. On further investigation she has had multiple falls and was found to have hip weakness on exam. Given these findings I was definitely concern for pathology in her back. She therefore received a lumbar spine x-ray and lumbar spine MRI and presents today for followup of these.  Patient states that she continues to have pain in her right knee. She is also getting pain in her right thigh. Overall she is no better than when I previously saw her. She has had one fall since our visit. She took some Vicodin which helped her pain only shortly. She has also been taking the meloxicam that I prescribed but has received minimal relief from this.  MRI RESULTS: IMPRESSION:  1. Lumbar spondylosis and degenerative disc disease, causing  prominent impingement at L3-4; moderate to prominent impingement at  L5-S1; moderate impingement at L1-2, L2-3, and L4-5; and mild  impingement at T11-12 as detailed above.   ROS: As above in the HPI. All other systems are stable or negative.  OBJECTIVE: APPEARANCE:  Patient in no acute distress.The patient appeared well nourished and normally developed. HEENT: No scleral icterus. Conjunctiva non-injected Resp: Non labored Skin: No rash MSK:  Low back exam: - No tenderness to palpation over the spinous processes of the lumbar vertebra - No tenderness to palpation at the SI joint or sciatic notch - Negative straight leg raise - Strength is reduced to 4/5 in hip flexion especially with knee bent. Strength is normal in knee extension, knee flexion, foot dorsi and plantar flexion, great toe flexion - Reflexes 2+ bilaterally. Right Knee - Inspection normal with no erythema or effusion or obvious bony abnormalities.  - Palpation normal with no warmth or joint  line tenderness. No TTP at patellar or quad tendon.  - ROM normal in flexion and extension. - Strength 5/5 in flexion and extension. - Ligaments with solid consistent endpoints including ACL, PCL, LCL, MCL.  - Negative Mcmurray's.  - Neurovascularly intact   ASSESSMENT: #1. Right thigh pain and hip flexor weakness with concern for L3 nerve impingement based on patient's exam and MRI results #2. Right knee pain with negative knee exam, concern for referred pain from back  PLAN: Discussed with the patient that I am concerned about her weakness especially given that she is having frequent falls. Patient is agreeable to seeing a spine surgeon for further evaluation and treatment, this referral was placed today.. She is very nervous about this but is willing to at least have a visit to discuss further options. In the meantime, I suspect that her thigh pain and knee pain is coming from her back so we will try a prednisone dose pack for symptom management. She may be a good candidate for medication such as gabapentin or Lyrica in the future. She will followup with me in 3 weeks.

## 2012-09-10 ENCOUNTER — Ambulatory Visit (INDEPENDENT_AMBULATORY_CARE_PROVIDER_SITE_OTHER): Payer: Medicare Other | Admitting: Psychiatry

## 2012-09-10 ENCOUNTER — Encounter (HOSPITAL_COMMUNITY): Payer: Self-pay | Admitting: Psychiatry

## 2012-09-10 VITALS — BP 168/95 | HR 90 | Wt 187.6 lb

## 2012-09-10 DIAGNOSIS — F329 Major depressive disorder, single episode, unspecified: Secondary | ICD-10-CM

## 2012-09-10 DIAGNOSIS — F309 Manic episode, unspecified: Secondary | ICD-10-CM

## 2012-09-10 DIAGNOSIS — F419 Anxiety disorder, unspecified: Secondary | ICD-10-CM

## 2012-09-10 MED ORDER — ESCITALOPRAM OXALATE 20 MG PO TABS: 20.0000 mg | ORAL_TABLET | Freq: Every day | ORAL | Status: DC

## 2012-09-10 MED ORDER — GABAPENTIN 300 MG PO CAPS: 300.0000 mg | ORAL_CAPSULE | Freq: Every day | ORAL | Status: DC

## 2012-09-10 MED ORDER — RISPERIDONE 1 MG PO TABS
1.0000 mg | ORAL_TABLET | Freq: Every day | ORAL | Status: DC
Start: 2012-09-10 — End: 2012-09-10

## 2012-09-10 MED ORDER — RISPERIDONE 1 MG PO TABS: 1.0000 mg | ORAL_TABLET | Freq: Every day | ORAL | Status: DC

## 2012-09-10 NOTE — Progress Notes (Signed)
[facility] [id number] Progress Note  [name] [id number] 62 y.o.  09/10/2012 2:33 PM  Chief Complaint:  My primary care physician did not help me.  She is not giving me any pain medication.      History of Present Illness: Patient is 62 year-old Caucasian unemployed female who came for a follow up appointment.  Today patient is upset and labile.  She is not happy with her primary care physician who recommended to see a specialist and did not prescribe any pain medication.  She was seen by pain management and recommended to see his spine surgeon because of her chronic back pain.  She was not given pain medication from him and patient became very upset.  She was started on steroids and since then patient has noticed increased agitation irritability mood swings and anger.  She is sleeping only one to 2 hours.  She also gained weight with a 15 pound in one week.  She is not happy with her weight gain.  She continues to have complain with providers who are not understanding her pain issue .  Today she is very irritable angry and upset .  She is also not happy with her daughter who is not responding her text messages.  She admitted to crying spells racing thoughts and passive suicidal thoughts but no plan.  She told her she wants to live because of her dogs .  She complained financial issues and does not want to try any brand medication.  She denies drinking or using any other substances.   Suicidal Ideation: No Plan Formed: No Patient has means to carry out plan: No  Homicidal Ideation: No Plan Formed: No Patient has means to carry out plan: No  Review of Systems: Psychiatric: Agitation: Yes Hallucination: No Depressed Mood: Yes Insomnia: No Hypersomnia: No Altered Concentration: No Feels Worthless: No Grandiose Ideas: No Belief In Special Powers: No New/Increased Substance Abuse: No Compulsions: No  Neurologic: Headache: Yes Seizure: No Paresthesias: No  Medical  History:  Patient has a history of hyperlipidemia, migraine headache, hypertension and chronic back pain.  She see Dr. Criselda Peaches.  Family history. Patient endorsed sibling has psychiatric problem however she did not provide much information.  Psychosocial history. Patient was born in [place].  She moved to [place] wind knocked her she wanted to start a new life.  She's been married 3 times.  She has one daughter who lives in [place].  She admitted a difficult relationship with her daughter.  Patient endorses used to live she got divorced in 59s.  Patient lives by herself and with her dog.  Outpatient Encounter Prescriptions as of 09/10/2012  Medication Sig Dispense Refill  . ALPRAZolam (XANAX) 0.5 MG tablet TAKE ONE TABLET BY MOUTH THREE TIMES DAILY AS NEEDED FOR SLEEP OR ANXIETY (2 TABLETS AT BEDTIME FOR SLEEP)  120 tablet  0  . azelastine (ASTELIN) 137 MCG/SPRAY nasal spray Place 2 sprays into the nose 2 (two) times daily. Use in each nostril as directed  30 mL  2  . butalbital-acetaminophen-caffeine (FIORICET) 50-325-40 MG per tablet Take 1-2 tablets by mouth every 8 (eight) hours as needed for headache.  20 tablet  1  . escitalopram (LEXAPRO) 20 MG tablet Take 1 tablet (20 mg total) by mouth daily.  30 tablet  0  . gabapentin (NEURONTIN) 300 MG capsule Take 1 capsule (300 mg total) by mouth at bedtime.  30 capsule  0  . HYDROcodone-acetaminophen (NORCO/VICODIN) 5-325 MG per tablet Take 1 tablet  by mouth 2 (two) times daily as needed for pain. No additional pain medications will be given without an appointment  20 tablet  0  . meloxicam (MOBIC) 15 MG tablet Take one a day for 7 days then prn  40 tablet  1  . omeprazole (PRILOSEC) 20 MG capsule Take 1 capsule (20 mg total) by mouth daily.  30 capsule  3  . ondansetron (ZOFRAN ODT) 4 MG disintegrating tablet Take 1 tablet (4 mg total) by mouth every 8 (eight) hours as needed for nausea (Place 1 tablet under the tongue).  20 tablet  0  .  risperiDONE (RISPERDAL) 1 MG tablet Take 1 tablet (1 mg total) by mouth at bedtime.  30 tablet  0  . [DISCONTINUED] buPROPion (WELLBUTRIN XL) 300 MG 24 hr tablet Take 1 tablet (300 mg total) by mouth every morning.  30 tablet  1  . [DISCONTINUED] escitalopram (LEXAPRO) 20 MG tablet Take 1 tablet (20 mg total) by mouth daily.  30 tablet  1  . [DISCONTINUED] escitalopram (LEXAPRO) 20 MG tablet Take 1 tablet (20 mg total) by mouth daily.  30 tablet  0  . [DISCONTINUED] escitalopram (LEXAPRO) 20 MG tablet Take 1 tablet (20 mg total) by mouth daily.  30 tablet  0  . [DISCONTINUED] gabapentin (NEURONTIN) 300 MG capsule Take 1 capsule (300 mg total) by mouth at bedtime.  30 capsule  0  . [DISCONTINUED] gabapentin (NEURONTIN) 300 MG capsule Take 1 capsule (300 mg total) by mouth at bedtime.  30 capsule  0  . [DISCONTINUED] predniSONE (STERAPRED UNI-PAK) 10 MG tablet Take 1 tablet (10 mg total) by mouth daily.  1 tablet  0  . [DISCONTINUED] risperiDONE (RISPERDAL) 1 MG tablet Take 1 tablet (1 mg total) by mouth at bedtime.  30 tablet  0  . [DISCONTINUED] risperiDONE (RISPERDAL) 1 MG tablet Take 1 tablet (1 mg total) by mouth at bedtime.  30 tablet  0   No facility-administered encounter medications on file as of 09/10/2012.    Past Psychiatric History/Hospitalization(s): Patient has seen psychiatrist and therapist in [place] however did not like them.  In the past she had tried Prozac Effexor and trazodone. She denies any history of suicidal attempt. She denies any history of mania or psychosis. She is taking Wellbutrin and Lexapro which are prescribed initially by psychiatrists but then she start taking these medication from her primary care physician.  He said he did try Lamictal but she developed side effects including hives and rash.  Anxiety: Yes Bipolar Disorder: No Depression: Yes Mania: No Psychosis: No Schizophrenia: No Personality Disorder: No Hospitalization for psychiatric illness:  No History of Electroconvulsive Shock Therapy: No Prior Suicide Attempts: No  Physical Exam: Constitutional:  BP 168/95  Pulse 90  Wt 187 lb 9.6 oz (85.095 kg)  BMI 36.64 kg/m2  Recent Results (from the past 2160 hour(s))  BASIC METABOLIC PANEL WITH GFR     Status: None   Collection Time    08/26/12 11:45 AM      Result Value Range   Sodium 136  135 - 145 mEq/L   Potassium 4.1  3.5 - 5.3 mEq/L   Chloride 100  96 - 112 mEq/L   CO2 28  19 - 32 mEq/L   Glucose, Bld 94  70 - 99 mg/dL   BUN 11  6 - 23 mg/dL   Creat 1.61  0.96 - 0.45 mg/dL   Calcium 9.6  8.4 - 40.9 mg/dL   GFR, Est African  American >89     GFR, Est Non African American 86     Comment:       The estimated GFR is a calculation valid for adults (>=83 years old)     that uses the CKD-EPI algorithm to adjust for age and sex. It is       not to be used for children, pregnant women, hospitalized patients,        patients on dialysis, or with rapidly changing kidney function.     According to the NKDEP, eGFR >89 is normal, 60-89 shows mild     impairment, 30-59 shows moderate impairment, 15-29 shows severe     impairment and <15 is ESRD.          General Appearance: alert, oriented, no acute distress and well nourished  Musculoskeletal: Strength & Muscle Tone: within normal limits Gait & Station: normal Patient leans: N/A  Psychiatric: Speech (describe rate, volume, coherence, spontaneity, and abnormalities if any): Past and loud.  Increase volume and tone.    Thought Process (describe rate, content, abstract reasoning, and computation): Circumstantial and less goal-directed.  Labile and irritable.  Associations: Relevant and Circumstantial  Thoughts: normal and Preoccupied with the pain.  Mental Status: Orientation: oriented to person, place, time/date and situation Mood & Affect: labile affect Attention Span & Concentration: Fair  Medical Decision Making (Choose Three): Established Problem,  Stable/Improving (1), New problem, with additional work up planned, Decision to obtain old records (1), Established Problem, Worsening (2), Review of Last Therapy Session (1), Review of Medication Regimen & Side Effects (2) and Review of New Medication or Change in Dosage (2)  Assessment: Axis I: Medication-induced mania , Depressive disorder NOS, rule out major depressive disorder  Axis II: Deferred  Axis III:  Patient Active Problem List   Diagnosis Date Noted  . Abnormality of gait 04/27/2012  . Major depressive disorder, recurrent episode, severe, without mention of psychotic behavior 04/22/2012  . Cannabis abuse, unspecified 04/22/2012  . Financial difficulties 02/19/2012  . Chest pain, atypical 01/08/2012  . Routine adult health maintenance 11/06/2011  . Seasonal allergic reaction 11/06/2011  . Vegetarian diet 08/18/2011  . Diverticulosis 04/19/2011  . Weight gain 04/10/2011  . Metabolic syndrome 01/04/2011  . BACK PAIN, LUMBAR 06/17/2009  . HIP PAIN 06/02/2009  . HYPERLIPIDEMIA 02/20/2008  . HYPERTENSION, BENIGN ESSENTIAL 10/23/2007  . KNEE PAIN, BILATERAL 11/07/2006  . MENOPAUSAL SYNDROME 08/22/2006  . MIGRAINE VARIANTS, W/INTRACTABLE MIGRAINE 03/28/2006  . INSOMNIA 03/28/2006    Axis IV: Mild to moderate  Axis V: 55-65   Plan:  I review her symptoms, current medication and collateral reports from her pain management and primary care physician.  I strongly believe that prednisone is making her more manic and irritable.  I recommend to stop prednisone immediately .  I would also recommend to stop the Wellbutrin for now .  Recommend to try Risperdal 1 mg at bedtime to help for psychosis and mania.  Provide gabapentin 300 mg to help her pain anxiety and mood symptoms.  Discussed in detail the risks and benefits of medication especially interaction with psychotropic medication, weight gain and extrapyramidal side effects.  Patient scheduled to see Belenda Cruise next week for  counseling.  I recommend to see her primary care physician for pain management .  Follow up in 2 weeks .  Time spent 25 minutes.  Time spent 25 minutes.  More than 50% of the time spent in psychoeducation, counseling and coordination of care.  Discuss safety plan  that anytime having active suicidal thoughts or homicidal thoughts then patient need to call 911 or go to the local emergency room.  ARFEEN,SYED T., MD 09/10/2012

## 2012-09-13 ENCOUNTER — Other Ambulatory Visit: Payer: Self-pay | Admitting: Internal Medicine

## 2012-09-13 NOTE — Telephone Encounter (Signed)
Call from pt - requesting med to be filled this morning; informed pt the request was just put in around 1000AM. Need to give MD time to look at the refill request. Wants to pick up rx while she has a car. I told her will call when ready. Stated she's out of medication.Stated she does not like our system; then hunged up

## 2012-09-13 NOTE — Telephone Encounter (Signed)
Xanax rx called to [facility]. 

## 2012-09-13 NOTE — Telephone Encounter (Signed)
Pt informed of refill. °

## 2012-09-16 ENCOUNTER — Ambulatory Visit (INDEPENDENT_AMBULATORY_CARE_PROVIDER_SITE_OTHER): Payer: Medicare Other | Admitting: Licensed Clinical Social Worker

## 2012-09-16 ENCOUNTER — Telehealth: Payer: Self-pay | Admitting: *Deleted

## 2012-09-16 DIAGNOSIS — F332 Major depressive disorder, recurrent severe without psychotic features: Secondary | ICD-10-CM

## 2012-09-16 NOTE — Telephone Encounter (Signed)
Pt called wants to talk to Dr Criselda Peaches - appt sch 10/07/12 9:45AM - canceled appt 11/2012 pt aware. Saw Dr Neomia Dear about back - had many questions. Stanton Kidney Brysten Reister RN 09/16/12 2PM

## 2012-09-16 NOTE — Progress Notes (Signed)
   THERAPIST PROGRESS NOTE  Session Time: 3:00pm-3:50pm  Participation Level: Active  Behavioral Response: Well GroomedAlertDepressed and Irritable  Type of Therapy: Individual Therapy  Treatment Goals addressed: Coping  Interventions: CBT, Motivational Interviewing, Strength-based, Supportive and Reframing  Summary: [name] is a 62 y.o. female who presents with depressed mood and agitated affect. She begins by asking this writer what Dr. Lolly Mustache said about her after her visit. She knows she was "crazy" during that visit and processes her anger that she was called manic. She strongly disagrees with this, even if it was prednisone induced mania. She feels as though no one cares about her and calls herself an orphan who receives "bad" medical care. She is angry over her "incompetant" doctors. She was upset after learning that she may need back surgery. She catastrophizes her back problems, talking about becoming "crippled" and using a wheel chair, when her back problem is not that bad. She lacks insight into how her strong negative self talk and angry projection negatively impacts her ability to relate to others. Her appetite has increased with weight gain and her sleep has been inconsistent due to prednisone, which she is no finished with.    Suicidal/Homicidal: Nowithout intent/plan  Therapist Response: Assessed patients current functioning and reviewed progress. Reviewed coping strategies. Assessed patients safety and assisted in identifying protective factors.  Reviewed crisis plan with patient. Assisted patient with the expression of frustration. Reviewed patients self care plan. Assessed progress related to self care. Patients self care is poor. Recommend daily exercise, increased socialization and recreation. Used CBT to assist patient with the identification of negative distortions and irrational thoughts. Encouraged patient to verbalize alternative and factual responses which  challenge thought distortions. Used DBT to practice mindfulness, review distraction list and improve distress tolerance skills. Used motivational interviewing to assist and encourage patient through the change process. Explored patients barriers to change.   Plan: Return again in three weeks.  Diagnosis: Axis I: Major Depression, Recurrent severe    Axis II: No diagnosis    Jago Carton, LCSW 09/16/2012

## 2012-09-17 ENCOUNTER — Ambulatory Visit: Payer: Self-pay | Admitting: Family Medicine

## 2012-09-19 ENCOUNTER — Encounter: Payer: Self-pay | Admitting: *Deleted

## 2012-09-19 MED ORDER — TRAMADOL HCL 50 MG PO TABS: 50.0000 mg | ORAL_TABLET | Freq: Four times a day (QID) | ORAL | Status: DC | PRN

## 2012-09-24 ENCOUNTER — Ambulatory Visit (HOSPITAL_COMMUNITY): Payer: Self-pay | Admitting: Psychiatry

## 2012-09-27 ENCOUNTER — Other Ambulatory Visit: Payer: Self-pay | Admitting: *Deleted

## 2012-09-30 MED ORDER — BUTALBITAL-APAP-CAFFEINE 50-325-40 MG PO TABS: 1.0000 | ORAL_TABLET | Freq: Three times a day (TID) | ORAL | Status: DC | PRN

## 2012-09-30 MED ORDER — HYDROCODONE-ACETAMINOPHEN 5-325 MG PO TABS: 1.0000 | ORAL_TABLET | Freq: Two times a day (BID) | ORAL | Status: DC | PRN

## 2012-09-30 NOTE — Telephone Encounter (Signed)
Rx called in to pharmacy and pt is aware. Discuss watching mg on meds with Tylenol per day. Has appt 10/07/12 with Dr Criselda Peaches - pt aware was given 20 tab pain med till appt.

## 2012-10-07 ENCOUNTER — Encounter: Payer: Self-pay | Admitting: Internal Medicine

## 2012-10-11 ENCOUNTER — Other Ambulatory Visit: Payer: Self-pay | Admitting: *Deleted

## 2012-10-11 DIAGNOSIS — F329 Major depressive disorder, single episode, unspecified: Secondary | ICD-10-CM

## 2012-10-11 DIAGNOSIS — F419 Anxiety disorder, unspecified: Secondary | ICD-10-CM

## 2012-10-11 NOTE — Telephone Encounter (Signed)
Pt would like for you to call her at [phone]

## 2012-10-14 MED ORDER — ESCITALOPRAM OXALATE 20 MG PO TABS: 20.0000 mg | ORAL_TABLET | Freq: Every day | ORAL | Status: DC

## 2012-10-14 MED ORDER — ALPRAZOLAM 0.5 MG PO TABS: 0.5000 mg | ORAL_TABLET | Freq: Three times a day (TID) | ORAL | Status: DC | PRN

## 2012-10-14 MED ORDER — OMEPRAZOLE 20 MG PO CPDR: 20.0000 mg | DELAYED_RELEASE_CAPSULE | Freq: Every day | ORAL | Status: DC

## 2012-10-15 ENCOUNTER — Ambulatory Visit (INDEPENDENT_AMBULATORY_CARE_PROVIDER_SITE_OTHER): Payer: Medicare Other | Admitting: *Deleted

## 2012-10-15 ENCOUNTER — Other Ambulatory Visit: Payer: Self-pay | Admitting: Internal Medicine

## 2012-10-15 DIAGNOSIS — Z23 Encounter for immunization: Secondary | ICD-10-CM

## 2012-10-15 NOTE — Telephone Encounter (Signed)
Called to pharm 

## 2012-10-16 ENCOUNTER — Telehealth: Payer: Self-pay | Admitting: Internal Medicine

## 2012-10-16 NOTE — Telephone Encounter (Signed)
Called and spoke with Ms. Uhls regarding her concerns re: her back pain and recent MRI.  She just saw the spine surgeon and got news that she could do PT to help with her back pain and no need for intervention yet.  She reports that she wanted to talk to me b/c she was concerned that she would need surgery and that she might die from the surgery or be disabled.  She reports feeling much better now that she has the good news.    She received her flu shot yesterday and is looking forward to PT.  I encouraged her in this endeavor.    Signed  Debe Coder, MD

## 2012-10-30 ENCOUNTER — Other Ambulatory Visit: Payer: Self-pay | Admitting: *Deleted

## 2012-10-30 NOTE — Telephone Encounter (Signed)
Pt using for knee and back pain. Increase pain with increase activity.  Pt changed her mind and wants to wait until appointment before getting new med.

## 2012-11-25 ENCOUNTER — Encounter: Payer: Medicare Other | Admitting: Internal Medicine

## 2012-12-18 ENCOUNTER — Telehealth: Payer: Self-pay | Admitting: *Deleted

## 2012-12-18 NOTE — Telephone Encounter (Signed)
Call from pt - c/o back pain; states she can hardly move - hurts when she breathes even when she sits up. Also states she does not have Tramadol (causes h/a's) nor Hydrocodone. Wants something called to her pharmacy. Pt informed the clinic will be closed the next 2 days. Thanks

## 2012-12-23 ENCOUNTER — Other Ambulatory Visit: Payer: Self-pay | Admitting: Internal Medicine

## 2012-12-23 MED ORDER — HYDROCODONE-ACETAMINOPHEN 5-325 MG PO TABS: 1.0000 | ORAL_TABLET | Freq: Two times a day (BID) | ORAL | Status: DC | PRN

## 2012-12-23 NOTE — Telephone Encounter (Signed)
Called patient at number provided in chart.  No answer.  VM had no identifying factors so no message was left.  Will attempt call at end of day.

## 2012-12-23 NOTE — Telephone Encounter (Signed)
Called and spoke with [name] about her pain.  She is having intermittent pains in her back and knees which wax and wane. Worse when cold or damp.  She has tried flexeril and advil with only intermittent relief.  She has also tried heating pads and ice.  She has also developed a headache in the last couple of days.  She was previously seeing a Sports Medicine Doctor and she went to a Pensions consultant who did not recommend surgery.  They suggested PT, but she could not afford the co-pay.  She does have exercises, but is sometimes not able to do them.  She tries to do them twice a day.  Today her symptoms are not that bad, she is on a better day.    She previously had a small Rx for Vicoden which she took at night to help her sleep, which did help.  She is now taking Benadryl to help her sleep.    She had her [employer] Card expired and she has been receiving bills from [facility] for her services that she needed.  She has since changed insurance to [employer] to start in January, which has concerned her.   Plan:   Will Rx hydrocodone 5mg /apap 325mg  30 tab to allow her to have some pain relief during the day and see her in January.

## 2013-01-13 ENCOUNTER — Other Ambulatory Visit (HOSPITAL_COMMUNITY): Payer: Self-pay | Admitting: Psychiatry

## 2013-01-13 NOTE — Telephone Encounter (Signed)
Chart reviewed, refill not appropriate. Last seen 09/10/12, pt cancelled appointment in Sept stating she did not wish to see provider Dr. Lolly Mustache any longer. Medication has not been filled since 09/10/12.

## 2013-01-14 ENCOUNTER — Other Ambulatory Visit: Payer: Self-pay | Admitting: Internal Medicine

## 2013-01-15 ENCOUNTER — Other Ambulatory Visit: Payer: Self-pay | Admitting: Internal Medicine

## 2013-01-29 ENCOUNTER — Other Ambulatory Visit: Payer: Self-pay | Admitting: Internal Medicine

## 2013-01-30 NOTE — Telephone Encounter (Signed)
Called to pharm 

## 2013-02-17 ENCOUNTER — Ambulatory Visit (INDEPENDENT_AMBULATORY_CARE_PROVIDER_SITE_OTHER): Payer: Medicare Other | Admitting: Internal Medicine

## 2013-02-17 ENCOUNTER — Encounter: Payer: Self-pay | Admitting: Internal Medicine

## 2013-02-17 VITALS — BP 120/77 | HR 100 | Temp 97.8°F | Wt 192.7 lb

## 2013-02-17 DIAGNOSIS — M545 Low back pain, unspecified: Secondary | ICD-10-CM

## 2013-02-17 DIAGNOSIS — G4700 Insomnia, unspecified: Secondary | ICD-10-CM

## 2013-02-17 DIAGNOSIS — G43819 Other migraine, intractable, without status migrainosus: Secondary | ICD-10-CM

## 2013-02-17 DIAGNOSIS — F332 Major depressive disorder, recurrent severe without psychotic features: Secondary | ICD-10-CM

## 2013-02-17 DIAGNOSIS — I10 Essential (primary) hypertension: Secondary | ICD-10-CM

## 2013-02-17 DIAGNOSIS — Z0000 Encounter for general adult medical examination without abnormal findings: Secondary | ICD-10-CM

## 2013-02-17 DIAGNOSIS — E785 Hyperlipidemia, unspecified: Secondary | ICD-10-CM

## 2013-02-17 DIAGNOSIS — G43119 Migraine with aura, intractable, without status migrainosus: Secondary | ICD-10-CM

## 2013-02-17 LAB — LIPID PANEL
CHOL/HDL RATIO: 3.9 ratio
CHOLESTEROL: 135 mg/dL (ref 0–200)
HDL: 35 mg/dL — AB (ref >39)
LDL Cholesterol: 62 mg/dL (ref 0–99)
Triglycerides: 190 mg/dL — ABNORMAL HIGH (ref <150)
VLDL: 38 mg/dL (ref 0–40)

## 2013-02-17 MED ORDER — HYDROCODONE-ACETAMINOPHEN 5-325 MG PO TABS: 1.0000 | ORAL_TABLET | Freq: Two times a day (BID) | ORAL | Status: DC | PRN

## 2013-02-17 NOTE — Progress Notes (Signed)
Subjective:    Patient ID: [name], female    DOB: February 02, 1950, 63 y.o.   MRN: [id number]  CC : Follow up for chronic health issues.   HPI  [name] is a 63yo woman with PMH of HTN (not on medications), HLD (last LDL 138), chronic pain in the knee, hips, migraines, MDD/anxiety and insomnia who presents today for routine follow up.    [name] reports that she is doing "good and bad."  She reports having cold like symptoms including rhinorrhea and sneezing, sinus headaches, denies myalgias or fevers.  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  She takes in many greyhounds who don't have places to stay.  She reports her mood over the holidays was bad.  She reports similar symptoms of not wanting to leave the house, decreased interest in things she enjoys, increased sleep.  She is only motivated to do things due to her dogs.  His twin brother is in the hospital right now for hypertension.  She apparently had a falling out with her psychiatrist in the summer and has not been following with him.  We had a lengthy discussion about her mood and most of her ups and downs continue to be connected to her finances.  On questioning, she actually has a good local support system of a neighbor who regularly checks on her and her Greyhound/dog sitting group.  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  This seems to add stress and joy to her life.  Her main frustrations are the fractured relationship with her family and she does not feel like counseling will help with that.  She denies going to see another psychiatrist or counselor at this time. She is not actively suicidal based on my evaluation today.   BP today is 120/77, she is not on medications but holds the diagnosis of HTN.  She has gained about 10 pounds since last visit in August due to not being able to exercise and antidepressent medications,  however, she would like to maintain on these medications at this time.    She has HLD with last LDL about a year ago at 138.  Her 10 year CV risk is only 4%.  Will recheck FLP today.  She continues to be on Lexapro and Xanax for her mood disorder, which I will continue to order.  She has decreased her xanax use to once or twice a day, which is an improvement for her.    Review of Systems  Constitutional: Negative for fever, fatigue and unexpected weight change.  HENT: Positive for congestion, rhinorrhea, sinus pressure, sneezing and sore throat. Negative for mouth sores and trouble swallowing.   Respiratory: Positive for cough. Negative for choking, chest tightness and shortness of breath.   Cardiovascular: Negative for chest pain and leg swelling.  Gastrointestinal: Negative for nausea, vomiting, abdominal pain and diarrhea.  Genitourinary: Negative for urgency and difficulty urinating.  Musculoskeletal: Positive for arthralgias, back pain and neck pain.  Skin: Negative for rash and wound.  Neurological: Positive for headaches. Negative for syncope and light-headedness.  Psychiatric/Behavioral: Positive for dysphoric mood. Negative for hallucinations and self-injury. The patient is nervous/anxious. The patient is not hyperactive.        Objective:   Physical Exam  Constitutional: She is oriented to person, place, and time. She appears well-developed and well-nourished. No distress.  HENT:  Head: Normocephalic and atraumatic.  Right Ear: External ear normal.  Left Ear: External ear normal.  Mouth/Throat: No oropharyngeal exudate.  Eyes: Pupils are equal, round, and reactive to light. Right eye exhibits no discharge. Left eye exhibits no discharge. No scleral icterus.  Neck: Neck supple. No thyromegaly present.  Cardiovascular: Normal rate, regular rhythm and normal heart sounds.   No murmur heard. Pulmonary/Chest: Effort normal and breath sounds normal. No respiratory distress. She has no  wheezes.  Abdominal: Soft. Bowel sounds are normal.  Musculoskeletal: She exhibits no edema and no tenderness.  Lymphadenopathy:    She has no cervical adenopathy.  Neurological: She is alert and oriented to person, place, and time. No cranial nerve deficit.  Skin: Skin is warm and dry. No erythema.  Psychiatric: Her behavior is normal.  Angry at times, animated at times    FLP today.       Assessment & Plan:  Please see problem oriented charting for complete discussion and plan.   RTC in 4-6 months, sooner if needed.

## 2013-02-17 NOTE — Assessment & Plan Note (Addendum)
Please see HPI.  Patient has ceased seeing her psychiatrist and therapist.  I believe most of her fluctuating mood is due to current financial situation and fracture relationship with her family.  She does not seem to have the resources to fix either of these on her own, however, she is adamant about not taking "handouts."  Will continue to discuss possible qualification for assistance as this may improve her situation somewhat.   Will continue lexapro and PRN xanax for now.

## 2013-02-17 NOTE — Assessment & Plan Note (Signed)
Relatively well controlled today.  But she has flares with multiple headaches, appear stress related.

## 2013-02-17 NOTE — Assessment & Plan Note (Addendum)
We discussed at length her back and knee pain.  She has been evaluated by many providers and a Psychologist, sport and exercise.  She has some relief of her sleeping issues with taking one vicoden at night.  She is interested in a pain contract.  She does have [place] issues, however, she is not actively suicidal.  Will give one time Rx of Hydrocodone/tylenol today of 30 tab and see how often she requires refill.  She can have monthly to every other month refills and will sign pain contract at next visit.  We discussed this at length today.  I reviewed the [place] narcotic database and she has not been receiving medications from any other provider.

## 2013-02-17 NOTE — Assessment & Plan Note (Signed)
Mostly related to pain issues.  Improved with hydrocodone taken at night.  No hypnotics given her other medications at this time.

## 2013-02-17 NOTE — Assessment & Plan Note (Signed)
BP is well controlled today.  Has been elevated in the past.  Appears to fluctuate due to pain/anxiety and she is relatively well controlled today.  Will continue to monitor closely.

## 2013-02-17 NOTE — Assessment & Plan Note (Addendum)
Last LDL was 138, she is not on any medications, 10 year CV risk of 4%.  She can likely be on a low-mod intensity statin given her h/o HTN.  Check lipid panel today and make changes based on that.  She would prefer a diet modified option.    Update: LDL down to 62.  This appears to fluctuate widely with her diet.  Will recheck in the next 1-2 years as needed.

## 2013-02-17 NOTE — Assessment & Plan Note (Signed)
Colonoscopy: 2010 - diverticulosis, no polyps DEXA - 2008, osteopenia in the hip.  Due to diet, would recommend repeat and Ca/Vit D supplementation.   We did not have time to discuss at this visit, will defer to next.   MMG - 04/2011, negative PAP - 05/2011, inadequate, will plan for PAP at next visit.   Flu: 09/2012 Pneumovax at 63yo Tetanus: needs updating Zostavax: due, to discuss

## 2013-02-17 NOTE — Patient Instructions (Signed)
Please schedule a follow up visit in June of this year for a PAP/Pelvic.   For your medications:   Please bring all of your pill  Bottles with you to each visit.  This will help make sure that we have an up to date list of all the medications you are taking.  Please also bring any over the counter herbal medications you are taking (not including advil, tylenol, etc.)  Please continue taking all of your medications as prescribed.  We will send a form to your insurance company so that you can keep taking your Xanax.    We are checking your cholesterol today, if it continues to be elevated, we may need to start a medication.   Thank you!

## 2013-02-19 ENCOUNTER — Encounter: Payer: Self-pay | Admitting: Internal Medicine

## 2013-02-19 ENCOUNTER — Other Ambulatory Visit: Payer: Self-pay | Admitting: Internal Medicine

## 2013-02-19 DIAGNOSIS — F411 Generalized anxiety disorder: Secondary | ICD-10-CM | POA: Insufficient documentation

## 2013-02-19 MED ORDER — ALPRAZOLAM 0.5 MG PO TABS: 0.5000 mg | ORAL_TABLET | ORAL | Status: DC

## 2013-02-22 ENCOUNTER — Other Ambulatory Visit: Payer: Self-pay | Admitting: Internal Medicine

## 2013-02-28 ENCOUNTER — Telehealth: Payer: Self-pay | Admitting: *Deleted

## 2013-02-28 NOTE — Telephone Encounter (Signed)
Pt called asking if her md had sent a script in for clonidine, she was told no and she states she thought she was picking up her omeprazole 20mg  but the bottle says clonidine. She was advised to take her medicines back to [facility] and have the pharmacist to check them. Triage then called [facility] and informed them that someone needed to call pt and verify her meds.

## 2013-03-31 ENCOUNTER — Other Ambulatory Visit: Payer: Self-pay | Admitting: *Deleted

## 2013-04-01 MED ORDER — ALPRAZOLAM 0.5 MG PO TABS: 0.5000 mg | ORAL_TABLET | ORAL | Status: DC

## 2013-04-01 NOTE — Telephone Encounter (Signed)
Rx called in to pharmacy. 

## 2013-04-28 ENCOUNTER — Telehealth: Payer: Self-pay | Admitting: *Deleted

## 2013-04-28 NOTE — Telephone Encounter (Signed)
Pt called lm for rtc, called no answer, lm for pt to call office back

## 2013-04-30 ENCOUNTER — Ambulatory Visit (INDEPENDENT_AMBULATORY_CARE_PROVIDER_SITE_OTHER): Payer: Medicare Other | Admitting: Internal Medicine

## 2013-04-30 ENCOUNTER — Encounter: Payer: Self-pay | Admitting: Internal Medicine

## 2013-04-30 VITALS — BP 137/84 | HR 80 | Temp 97.8°F | Resp 20 | Wt 188.9 lb

## 2013-04-30 DIAGNOSIS — J329 Chronic sinusitis, unspecified: Secondary | ICD-10-CM | POA: Insufficient documentation

## 2013-04-30 DIAGNOSIS — J019 Acute sinusitis, unspecified: Secondary | ICD-10-CM

## 2013-04-30 DIAGNOSIS — B9689 Other specified bacterial agents as the cause of diseases classified elsewhere: Secondary | ICD-10-CM

## 2013-04-30 HISTORY — DX: Chronic sinusitis, unspecified: J32.9

## 2013-04-30 MED ORDER — AZITHROMYCIN 500 MG PO TABS: 500.0000 mg | ORAL_TABLET | Freq: Every day | ORAL | Status: AC

## 2013-04-30 NOTE — Progress Notes (Signed)
Subjective:   Patient ID: [name] female   DOB: [birth year] 63 y.o.   MRN: [id number]  HPI: [name] is a 63 y.o. woman with a past medical history significant for hypertension, Maj. Depression, metabolic syndrome, history of seasonal allergies comes to the clinic with a chief complaint of headache, cough of for the last 3 weeks.  Patient reports that her symptoms started 3 weeks ago with the runny nose, cough, headaches. The pain is located over the frontal area, maxillary area, ethmoidal area. The symptoms are more severe on the left side compared to the right side. Patient also complains of fever, body pains, chills. Her cough is productive with yellowish to greenish colored sputum. Patient states that she used over-the-counter Advil and Claritin for a few days without any relief. Patient states that she is allergic to pollen and she has had problems with her sinus many times in the past.  Patient denies any other complaints. Sleep     Past Medical History  Diagnosis Date  . Anxiety   . Depression   . GERD (gastroesophageal reflux disease)   . GEZMOQHU(765.4)    Current Outpatient Prescriptions  Medication Sig Dispense Refill  . ALPRAZolam (XANAX) 0.5 MG tablet Take 1 tablet (0.5 mg total) by mouth as directed. Take 1 tab twice daily prn and 1-2 at night prn for anxiety or sleep.  120 tablet  0  . azelastine (ASTELIN) 137 MCG/SPRAY nasal spray Place 2 sprays into the nose 2 (two) times daily. Use in each nostril as directed  30 mL  2  . azithromycin (ZITHROMAX) 500 MG tablet Take 1 tablet (500 mg total) by mouth daily.  5 tablet  0  . butalbital-acetaminophen-caffeine (FIORICET, ESGIC) 50-325-40 MG per tablet TAKE ONE TO TWO TABLETS BY MOUTH EVERY 8 HOURS AS NEEDED FOR HEADACHE.NO MORE THAN 3000MG  OF TYLENOL PER DAY  20 tablet  0  . escitalopram (LEXAPRO) 20 MG tablet Take 1 tablet (20 mg total) by mouth daily.  90 tablet  2  . gabapentin (NEURONTIN) 300 MG  capsule Take 1 capsule (300 mg total) by mouth at bedtime.  30 capsule  0  . HYDROcodone-acetaminophen (NORCO/VICODIN) 5-325 MG per tablet Take 1 tablet by mouth 2 (two) times daily as needed.  30 tablet  0  . meloxicam (MOBIC) 15 MG tablet Take one a day for 7 days then prn  40 tablet  1  . omeprazole (PRILOSEC) 20 MG capsule TAKE ONE CAPSULE BY MOUTH ONCE DAILY  30 capsule  2  . ondansetron (ZOFRAN ODT) 4 MG disintegrating tablet Take 1 tablet (4 mg total) by mouth every 8 (eight) hours as needed for nausea (Place 1 tablet under the tongue).  20 tablet  0  . risperiDONE (RISPERDAL) 1 MG tablet Take 1 tablet (1 mg total) by mouth at bedtime.  30 tablet  0  . traMADol (ULTRAM) 50 MG tablet Take 1 tablet (50 mg total) by mouth every 6 (six) hours as needed for pain.  50 tablet  2  . [DISCONTINUED] exenatide (BYETTA 5 MCG PEN) 5 MCG/0.02ML SOLN Inject 0.02 mLs (5 mcg total) into the skin 2 (two) times daily with a meal.  1.2 mL  3  . [DISCONTINUED] famotidine (PEPCID) 40 MG tablet Take 1 tablet (40 mg total) by mouth daily.  30 tablet  11   No current facility-administered medications for this visit.   Family History  Problem Relation Age of Onset  . Cancer Mother 35  Lung  . Parkinsonism Father   . Drug abuse Brother   . Depression Brother    History   Social History  . Marital Status: Divorced    Spouse Name: N/A    Number of Children: N/A  . Years of Education: N/A   Social History Main Topics  . Smoking status: Never Smoker   . Smokeless tobacco: Not on file  . Alcohol Use: No  . Drug Use: No  . Sexual Activity: Not on file   Other Topics Concern  . Not on file   Social History Narrative  . No narrative on file   Review of Systems:  Pertinent items are noted in HPI.   Objective:  Physical Exam: Filed Vitals:   04/30/13 1625  BP: 137/84  Pulse: 80  Temp: 97.8 F (36.6 C)  TempSrc: Oral  Resp: 20  Weight: 188 lb 14.4 oz (85.684 kg)  SpO2: 98%    Constitutional: Vital signs reviewed.  Patient is a well-developed and well-nourished and in no acute distress and cooperative with exam. Alert and oriented x3.  Head: Normocephalic and atraumatic with mild redness noted over the left cheek area. Ear: TM normal bilaterally Nose: Mild purulent drainage noted in the left nostril. Mouth: no erythema or exudates, MMM Sinus Exam: Mild tenderness elicited over the frontal, ethmoidal, maxillary sinus areas more on the left side than right side.  Eyes: PERRL, EOMI, conjunctivae normal, No scleral icterus.  Neck: Supple, Trachea midline normal ROM, No JVD, mass, thyromegaly, or carotid bruit present.  Cardiovascular: RRR, S1 normal, S2 normal, no MRG, pulses symmetric and intact bilaterally Pulmonary/Chest: normal respiratory effort, CTAB, no wheezes, rales, or rhonchi Neurological: A&O x3 Psychiatric: Normal mood and affect. speech and behavior is normal.

## 2013-04-30 NOTE — Patient Instructions (Signed)
Use the antibiotics as recommended. Use Tylenol OTC as needed for fever and body pains. Use Saline nasal irrigations daily. If the symptoms do not improve or worsen in a week, please give [place] a call.

## 2013-04-30 NOTE — Assessment & Plan Note (Signed)
Signs and symptoms are consistent with the acute sinusitis. Given the duration of symptoms for 3 weeks and purulent drainage in the nose, and systemic symptoms such as fever, chills and body pains for three weeks, I suspect bacterial infection. I will start her on antibiotic therapy along with the current conservative management.  Plans 1. azithromycin 500 mg once a day for 5 days. 2. take Tylenol as needed for fever and body pains 3. take Claritin as needed for her allergic symptoms. 4. saline nasal irrigation daily for symptomatic relief. 5. recommended to call her back symptoms do not improve or worsen.

## 2013-05-01 ENCOUNTER — Ambulatory Visit: Payer: Self-pay | Admitting: Internal Medicine

## 2013-05-03 NOTE — Addendum Note (Signed)
Addended by: Oval Linsey D on: 05/03/2013 05:07 PM   Modules accepted: Level of Service

## 2013-05-03 NOTE — Progress Notes (Signed)
Case discussed with Dr. Boggala at time of visit.  We reviewed the resident's history and exam and pertinent patient test results.  I agree with the assessment, diagnosis, and plan of care documented in the resident's note. 

## 2013-05-08 ENCOUNTER — Other Ambulatory Visit: Payer: Self-pay | Admitting: Internal Medicine

## 2013-05-08 NOTE — Telephone Encounter (Signed)
Pt called/informed of Xanax refill.

## 2013-05-29 ENCOUNTER — Ambulatory Visit: Payer: Self-pay | Admitting: Internal Medicine

## 2013-06-05 ENCOUNTER — Other Ambulatory Visit: Payer: Self-pay | Admitting: Internal Medicine

## 2013-06-19 ENCOUNTER — Other Ambulatory Visit: Payer: Self-pay | Admitting: *Deleted

## 2013-06-19 MED ORDER — BUTALBITAL-APAP-CAFFEINE 50-325-40 MG PO TABS: ORAL_TABLET | ORAL | Status: DC

## 2013-06-19 NOTE — Telephone Encounter (Signed)
Pt c/o headache, has used Fioricet in past with good results.

## 2013-07-02 ENCOUNTER — Ambulatory Visit (INDEPENDENT_AMBULATORY_CARE_PROVIDER_SITE_OTHER): Payer: Medicare Other | Admitting: Internal Medicine

## 2013-07-02 ENCOUNTER — Encounter: Payer: Self-pay | Admitting: Internal Medicine

## 2013-07-02 VITALS — BP 143/80 | HR 82 | Temp 97.1°F | Resp 20 | Ht 60.5 in | Wt 188.0 lb

## 2013-07-02 DIAGNOSIS — G43819 Other migraine, intractable, without status migrainosus: Secondary | ICD-10-CM

## 2013-07-02 DIAGNOSIS — F329 Major depressive disorder, single episode, unspecified: Secondary | ICD-10-CM

## 2013-07-02 DIAGNOSIS — F419 Anxiety disorder, unspecified: Secondary | ICD-10-CM

## 2013-07-02 DIAGNOSIS — I10 Essential (primary) hypertension: Secondary | ICD-10-CM

## 2013-07-02 DIAGNOSIS — J309 Allergic rhinitis, unspecified: Secondary | ICD-10-CM

## 2013-07-02 DIAGNOSIS — M545 Low back pain, unspecified: Secondary | ICD-10-CM

## 2013-07-02 DIAGNOSIS — Z0000 Encounter for general adult medical examination without abnormal findings: Secondary | ICD-10-CM

## 2013-07-02 DIAGNOSIS — F32A Depression, unspecified: Secondary | ICD-10-CM

## 2013-07-02 DIAGNOSIS — J302 Other seasonal allergic rhinitis: Secondary | ICD-10-CM

## 2013-07-02 DIAGNOSIS — F332 Major depressive disorder, recurrent severe without psychotic features: Secondary | ICD-10-CM

## 2013-07-02 DIAGNOSIS — G43919 Migraine, unspecified, intractable, without status migrainosus: Secondary | ICD-10-CM

## 2013-07-02 DIAGNOSIS — F411 Generalized anxiety disorder: Secondary | ICD-10-CM

## 2013-07-02 DIAGNOSIS — G43119 Migraine with aura, intractable, without status migrainosus: Secondary | ICD-10-CM

## 2013-07-02 MED ORDER — BUTALBITAL-APAP-CAFFEINE 50-325-40 MG PO TABS: ORAL_TABLET | ORAL | Status: DC

## 2013-07-02 MED ORDER — HYDROCODONE-ACETAMINOPHEN 5-325 MG PO TABS: 1.0000 | ORAL_TABLET | Freq: Two times a day (BID) | ORAL | Status: DC | PRN

## 2013-07-02 MED ORDER — ESCITALOPRAM OXALATE 20 MG PO TABS: 20.0000 mg | ORAL_TABLET | Freq: Every day | ORAL | Status: DC

## 2013-07-02 MED ORDER — ALPRAZOLAM 0.5 MG PO TABS: 0.5000 mg | ORAL_TABLET | ORAL | Status: DC

## 2013-07-02 NOTE — Patient Instructions (Signed)
General Instructions: Please schedule a follow up visit within the next 3 months.   For your medications:   Please bring all of your pill  Bottles with you to each visit.  This will help make sure that we have an up to date list of all the medications you are taking.  Please also bring any over the counter herbal medications you are taking (not including advil, tylenol, etc.)  Please continue taking Lexapro, Xanax as needed, omeprazole, and as needed Vicoden.   Please call me with any problems!  Thank you!    Treatment Goals:  Goals (1 Years of Data) as of 07/02/13     Lifestyle    . Prevent Falls       Progress Toward Treatment Goals:  Treatment Goal 07/02/2013  Blood pressure at goal  Prevent falls -    Self Care Goals & Plans:  Self Care Goal 07/02/2013  Manage my medications take my medicines as prescribed; refill my medications on time  Monitor my health keep track of my weight  Eat healthy foods drink diet soda or water instead of juice or soda  Be physically active find an activity I enjoy  Prevent falls -    No flowsheet data found.   Care Management & Community Referrals:  Referral 08/26/2012  Referrals made for care management support none needed  Referrals made to community resources -

## 2013-07-02 NOTE — Progress Notes (Signed)
   Subjective:    Patient ID: [name], female    DOB: February 09, 1950, 63 y.o.   MRN: [id number]  HPI  Ms. How is a 63yo woman with PMH of HLD, Migraine, HTN (not on any medication), DJD, insomnia, anxiety/depression who presents for follow up.    Continues to have sinus issues with rhinorrhea, difficulty breathing from her nose, decreased taste, yellowish green discharge.  No fevers, chills, nausea, vomiting, nose bleeds.  She has a history of allergic rhinitis. She has taken Afrin but this hasn't been working lately.  Afrin gave her a very bad migraine.  Taking claritin and claritin-d which haven't helped much.  She took antibiotics which didn't seem to help either, this was about 2 months ago.  She is considering taking Vitamin C and garlic.    Lost 2 dogs and moved.  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  She is worried consistently.   She had a bad time over her granddaughters birthday.  Daughter still won't talk to her.  We discussed this at length including her social isolation and concern re: money.  She has recently been paid for dog sitting which she sees as a "handout" however, I assured her that she was being paid for her dog sitting services.    Her BP today was 143/80, which is in line with what is normal for her.  She holds the dx of HTN but has not been treated for a while.  She continues to take lexapro and xanax for her depression/anxiety.  She has DJD which flares up when she gardens or walks a lot.   We reviewed her medications.   Xanax 0.5mg  BID and 2 tabs at bedtime prn Fioricet prn migraine Lexapro 20mg  daily Vicoden 5/325 prn joint pain Prilosec 20mg  daily    Review of Systems  Constitutional: Negative for fever, chills and fatigue.  HENT: Positive for congestion, rhinorrhea and sneezing. Negative for facial swelling, hearing loss, nosebleeds and trouble swallowing.   Eyes: Negative for pain and redness.  Respiratory: Negative  for cough and shortness of breath.   Cardiovascular: Negative for chest pain and leg swelling.  Gastrointestinal: Negative for abdominal pain, diarrhea and constipation.  Genitourinary: Negative for dysuria and difficulty urinating.  Musculoskeletal: Positive for arthralgias and back pain. Negative for gait problem.  Skin: Negative for rash and wound.  Neurological: Positive for headaches. Negative for dizziness, weakness and light-headedness.  Psychiatric/Behavioral: Negative for confusion and decreased concentration.       Objective:   Physical Exam  Constitutional: She is oriented to person, place, and time. She appears well-developed and well-nourished. No distress.  HENT:  Head: Normocephalic and atraumatic.  Right Ear: External ear normal.  Left Ear: External ear normal.  Mouth/Throat: No oropharyngeal exudate.  Eyes: Conjunctivae are normal. Pupils are equal, round, and reactive to light. No scleral icterus.  Cardiovascular: Normal rate, regular rhythm and normal heart sounds.   No murmur heard. Pulmonary/Chest: Effort normal and breath sounds normal. No respiratory distress. She has no wheezes.  Abdominal: Soft. Bowel sounds are normal. She exhibits no distension.  Musculoskeletal: She exhibits no edema.  Neurological: She is alert and oriented to person, place, and time.  Skin: Skin is warm and dry.  Psychiatric: She has a normal mood and affect. Her behavior is normal.    No labs today      Assessment & Plan:  RTC in 3 months, sooner prn

## 2013-07-03 MED ORDER — LEVOCETIRIZINE DIHYDROCHLORIDE 5 MG PO TABS: 5.0000 mg | ORAL_TABLET | Freq: Every evening | ORAL | Status: DC

## 2013-07-03 MED ORDER — FLUTICASONE PROPIONATE 50 MCG/ACT NA SUSP: 2.0000 | Freq: Every day | NASAL | Status: DC

## 2013-07-03 NOTE — Assessment & Plan Note (Signed)
Doing well.  Advised to stop using afrin.  Fioricet prn

## 2013-07-03 NOTE — Assessment & Plan Note (Signed)
Colonoscopy: 2010 - diverticulosis, no polyps DEXA - 2008, osteopenia in the hip.  Due to diet, would recommend repeat and Ca/Vit D supplementation, however, she refuses MMG - 04/2011, negative, refuses new mammogram PAP - 05/2011, inadequate, refuses.  States "no one would care if I had cancer."  We discussed at length and she continued to decline.  Stated we could talk about it "later"  Flu: 09/2012 Pneumovax at 63yo Tetanus: needs updating, see above.  Zostavax: due, to discuss

## 2013-07-03 NOTE — Assessment & Plan Note (Signed)
Flares when she does a lot of activity.  She takes Vicoden only prn.  Refilled today.

## 2013-07-03 NOTE — Assessment & Plan Note (Signed)
Not on any medications.  Continues to be well controlled.  Continue to monitor.

## 2013-07-03 NOTE — Assessment & Plan Note (Signed)
I think her symptoms are mainly related to allergies at this time.  I am prescribing zyrtec and flonase nasal spray and she will let me know if this helps.

## 2013-07-03 NOTE — Assessment & Plan Note (Signed)
I think this is her main issue and continues to affect her health and her interest in improving her health.  On discussion, she is often speaking about friends with whom she talks and spends time, talking with her grandkids regularly, etc.  However, she becomes tearful easily saying she has "no one" and "no one would care if I have cancer."  She previously was seeing a therapist and psychiatrist but she will no longer see them as they "didn't help."  I think her main contributors to her mood are a fractured relationship with her daughter; she has other seemingly health relationships; and finances, for which she will not accept assistance.    I will continue to discuss financial assistance with her and offer a new therapist if she will see someone.   Continue lexapro and xanax.

## 2013-07-07 ENCOUNTER — Telehealth: Payer: Self-pay | Admitting: *Deleted

## 2013-07-07 NOTE — Telephone Encounter (Signed)
Called and spoke with [name].  Zostavax requests was placed based on health questionnaire given at pharmacy.  She is not sure if the pharmacist will give her the shot or not.  She is only interested in getting the shot if her insurance will cover it.   Is there any way to find out if her insurance covers this injection?   Thanks

## 2013-07-07 NOTE — Telephone Encounter (Signed)
Fax from [employer] - pt requesting Zoster vaccine. Thanks

## 2013-07-07 NOTE — Telephone Encounter (Signed)
Called pt - informed her to call the pharmacy to see if they know if her insurance will cover the vaccine and if not she can call her insurance company.  Pt agreed.

## 2013-07-09 MED ORDER — ZOSTER VACCINE LIVE 19400 UNT/0.65ML ~~LOC~~ SOLR: 0.6500 mL | Freq: Once | SUBCUTANEOUS | Status: DC

## 2013-07-09 NOTE — Telephone Encounter (Signed)
I ordered the vaccine, but am not sure if it goes electronically.  Can you print and fax for me?   Thanks!

## 2013-07-09 NOTE — Telephone Encounter (Signed)
I talked to the pharmacist who stated pt requested the vaccine and will not be able to see how much it will cost until it is ordered and processed. Thanks

## 2013-07-10 NOTE — Telephone Encounter (Signed)
RX was electronic sent by MD.

## 2013-08-08 ENCOUNTER — Other Ambulatory Visit: Payer: Self-pay | Admitting: Internal Medicine

## 2013-08-19 ENCOUNTER — Other Ambulatory Visit (INDEPENDENT_AMBULATORY_CARE_PROVIDER_SITE_OTHER): Payer: Self-pay | Admitting: Otolaryngology

## 2013-08-19 DIAGNOSIS — J329 Chronic sinusitis, unspecified: Secondary | ICD-10-CM

## 2013-08-22 ENCOUNTER — Ambulatory Visit
Admission: RE | Admit: 2013-08-22 | Discharge: 2013-08-22 | Disposition: A | Discharge status: Home / Self Care | Payer: Medicare Other | Source: Ambulatory Visit | Attending: Otolaryngology | Admitting: Otolaryngology

## 2013-08-22 DIAGNOSIS — J329 Chronic sinusitis, unspecified: Secondary | ICD-10-CM

## 2013-08-31 ENCOUNTER — Other Ambulatory Visit: Payer: Self-pay | Admitting: Internal Medicine

## 2013-09-09 ENCOUNTER — Other Ambulatory Visit: Payer: Self-pay | Admitting: Internal Medicine

## 2013-09-10 ENCOUNTER — Telehealth: Payer: Self-pay | Admitting: *Deleted

## 2013-09-10 NOTE — Telephone Encounter (Signed)
Pt called - very upset - continue to have sinus problems since 02/2013. Nothing can be done except surgery $500.00. Pt states does not have that type of money.  Offered an appt to be seen in clinic - will wait till appt with Dr Daryll Drown 09/24/13. Suggest maybe a referral to [facility] for sinus. Will talk with Dr Daryll Drown. If she changes mind about coming earlier - will call clinic. Hilda Blades Inette Doubrava RN 09/10/13 3PM

## 2013-09-11 NOTE — Telephone Encounter (Signed)
Called to pharm 

## 2013-09-24 ENCOUNTER — Ambulatory Visit (INDEPENDENT_AMBULATORY_CARE_PROVIDER_SITE_OTHER): Payer: Medicare Other | Admitting: Internal Medicine

## 2013-09-24 ENCOUNTER — Encounter: Payer: Self-pay | Admitting: Internal Medicine

## 2013-09-24 VITALS — BP 138/82 | HR 85 | Temp 98.2°F | Ht 60.5 in | Wt 195.7 lb

## 2013-09-24 DIAGNOSIS — Z0000 Encounter for general adult medical examination without abnormal findings: Secondary | ICD-10-CM

## 2013-09-24 DIAGNOSIS — R739 Hyperglycemia, unspecified: Secondary | ICD-10-CM

## 2013-09-24 DIAGNOSIS — R7989 Other specified abnormal findings of blood chemistry: Secondary | ICD-10-CM

## 2013-09-24 DIAGNOSIS — E8881 Metabolic syndrome: Secondary | ICD-10-CM

## 2013-09-24 DIAGNOSIS — Z23 Encounter for immunization: Secondary | ICD-10-CM

## 2013-09-24 DIAGNOSIS — F332 Major depressive disorder, recurrent severe without psychotic features: Secondary | ICD-10-CM

## 2013-09-24 DIAGNOSIS — E785 Hyperlipidemia, unspecified: Secondary | ICD-10-CM

## 2013-09-24 DIAGNOSIS — I10 Essential (primary) hypertension: Secondary | ICD-10-CM

## 2013-09-24 DIAGNOSIS — R635 Abnormal weight gain: Secondary | ICD-10-CM

## 2013-09-24 DIAGNOSIS — J329 Chronic sinusitis, unspecified: Secondary | ICD-10-CM

## 2013-09-24 LAB — BASIC METABOLIC PANEL WITH GFR
BUN: 19 mg/dL (ref 6–23)
CO2: 27 mEq/L (ref 19–32)
Calcium: 9.2 mg/dL (ref 8.4–10.5)
Chloride: 103 mEq/L (ref 96–112)
Creat: 0.63 mg/dL (ref 0.50–1.10)
GFR, Est Non African American: >89 mL/min
Glucose, Bld: 128 mg/dL — ABNORMAL HIGH (ref 70–99)
Potassium: 3.7 mEq/L (ref 3.5–5.3)
SODIUM: 142 meq/L (ref 135–145)

## 2013-09-24 LAB — TSH: TSH: 0.27 u[IU]/mL — ABNORMAL LOW (ref 0.350–4.500)

## 2013-09-24 NOTE — Progress Notes (Signed)
Subjective:    Patient ID: [name], female    DOB: 15-Jun-1950, 63 y.o.   MRN: [id number]  CC: Routine follow up, sinuses HPI  [name] is a 63yo woman with PMH of HLD, Migraine, HTN, chronic arthralgias/pain, depression who presents for routine follow up.    [name] was recently diagnosed with an acute sinusitis in April of this year.  She had a course of antibiotics, but noted that they did not much improve her symptoms.  When I saw her last in June, she noted that she had been taking afrin, however, it gave her a migraine.  She was also taking claritin and claritin D.  At that time she was given a Rx for zyrtec and flonase nasal spray.  She did go to see ENT, Dr. Benjamine Mola and had a CT of her sinuses which revealed extensive disease, impression below in Objective section.  She has decided to do the surgery and has it scheduled for later this month.  She continues to have symptoms including discharge, headaches, and stuffy nose.  Flonase helped a little bit, but she has stopped using it.    She continues to gain weight, but also continues to eat cheeses, pasta and drink tea and has not been able to get exercise due to heat and humidity.  She further has hip and knee pain with walking which has limited her exercise as well.  She has some apathy to her situation.    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    She is due for mammogram and pap smear but has expressed again that she "doesn't care" about that stuff as "no one would care" if she got cancer.    She continues to have social issues and takes on the problems of a lot of people around her.  Lots of guilt no matter what she does, including taking care of dogs, visiting friends, etc. The stress of taking care of the dogs is high, but she needs to do it to make money. Continues to have issues with her daughter and a fractured relationship with her and missing seeing her grandkids.    Review of Systems    Constitutional: Negative for fever, chills and fatigue.  HENT: Positive for congestion, ear pain, postnasal drip, rhinorrhea and sinus pressure.   Eyes: Negative for photophobia and redness.  Respiratory: Negative for cough, choking and shortness of breath.   Cardiovascular: Negative for chest pain and leg swelling.  Gastrointestinal: Negative for diarrhea and constipation.  Genitourinary: Negative for dysuria and difficulty urinating.  Musculoskeletal: Positive for arthralgias and back pain.  Skin: Negative for color change and pallor.  Allergic/Immunologic: Positive for environmental allergies.  Neurological: Negative for dizziness and light-headedness.  Psychiatric/Behavioral: Positive for sleep disturbance and dysphoric mood. Negative for hallucinations. The patient is nervous/anxious.        Objective:   Physical Exam  Constitutional: She is oriented to person, place, and time.  Obese woman in NAD, alert.   HENT:  Head: Normocephalic and atraumatic.  Eyes: Conjunctivae are normal.  Neck: Neck supple.  Cardiovascular: Normal rate, regular rhythm and normal heart sounds.   Pulmonary/Chest: Effort normal and breath sounds normal. No respiratory distress. She has no wheezes.  Abdominal: Soft. Bowel sounds are normal.  Musculoskeletal: She exhibits no edema and no tenderness.  Neurological: She is alert and oriented to person, place, and time.  Skin: Skin is warm and dry.  Multiple dermatofibromas and skin tags on  shoulders, back, neck.  Psychiatric:  Tearful when talking about family.  Passive ideations that "no one would notice if I was gone."    CT Max/Face 08/22/13 IMPRESSION:  1. Near total opacification of the paranasal sinuses with some  sparing in the posterior ethmoids.  2. Question Caldwell-Luc procedure on the right.  3. Diffuse nasal opacification is well. This may represent sinonasal  polyposis. Recommend correlation with direct examination.  4. Right posterior  maxillary dental disease.  BMET, TSH today.  If glucose high on BMET ---> A1C to be done.      Assessment & Plan:  RTC in 6 months.

## 2013-09-24 NOTE — Patient Instructions (Signed)
General Instructions: Please schedule a follow up visit within the next 6 months.   For your medications:   Please bring all of your pill  Bottles with you to each visit.  This will help make sure that we have an up to date list of all the medications you are taking.  Please also bring any over the counter herbal medications you are taking (not including advil, tylenol, etc.)  Please continue taking all of your medications as prescribed.   Please let me know how your surgery goes!  Thank you!   Treatment Goals:  Goals (1 Years of Data) as of 09/24/13     Lifestyle    . Prevent Falls       Progress Toward Treatment Goals:  Treatment Goal 09/24/2013  Blood pressure at goal  Prevent falls -    Self Care Goals & Plans:  Self Care Goal 09/24/2013  Manage my medications take my medicines as prescribed; bring my medications to every visit; refill my medications on time  Monitor my health keep track of my weight  Eat healthy foods eat more vegetables; eat foods that are low in salt; eat baked foods instead of fried foods; eat smaller portions; eat fruit for snacks and desserts  Be physically active find an activity I enjoy; take a walk every day  Prevent falls -    No flowsheet data found.   Care Management & Community Referrals:  Referral 08/26/2012  Referrals made for care management support none needed  Referrals made to community resources -

## 2013-09-25 LAB — HEMOGLOBIN A1C
HEMOGLOBIN A1C: 6.4 % — AB (ref <5.7)
MEAN PLASMA GLUCOSE: 137 mg/dL — AB (ref <117)

## 2013-09-25 NOTE — Assessment & Plan Note (Signed)
This continues to be her main issue and I believe it affects many of her health problems noted above.  She continues to have issues with low mood and difficult relationships with her family.  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    She is adamant about not seeing a mental health provider again, so I listened and gave support to her today.  She is on lexapro and xanax and is tolerating these medications well.

## 2013-09-25 NOTE — Assessment & Plan Note (Signed)
Patient has been seen by ENT and surgery for sinusitis and polyposis has been scheduled.  She is looking forward to improvement in her symptoms.

## 2013-09-25 NOTE — Assessment & Plan Note (Signed)
Colonoscopy: 2010 - diverticulosis, no polyps DEXA - 2008, osteopenia in the hip.  Due to diet, would recommend repeat and Ca/Vit D supplementation, however, she has refused MMG - 04/2011, negative - she refuses further screening as "no one would care I have cancer" PAP - 05/2011, inadequate, refused further as above.   Flu: today Pneumovax at 63yo Tetanus: discuss at next visit.  Zostavax: done

## 2013-09-25 NOTE — Assessment & Plan Note (Signed)
Due to weight gain and mild hyperglycemia, checked an A1C which is 6.4 - pre diabetes.  Will speak with patient and counsel about this finding.

## 2013-09-25 NOTE — Assessment & Plan Note (Signed)
She is not currently on any medications, cholesterol is controlled by diet.  Her last LDL was 62.  Consider monitoring again next year given weight gain.

## 2013-09-25 NOTE — Assessment & Plan Note (Signed)
BP Readings from Last 3 Encounters:  09/24/13 138/82  07/02/13 143/80  04/30/13 137/84    Lab Results  Component Value Date   NA 142 09/24/2013   K 3.7 09/24/2013   CREATININE 0.63 09/24/2013    Assessment: Blood pressure control: controlled Progress toward BP goal:  at goal Comments: She is not on any medications  Plan: Medications:  lifestyle and diet Educational resources provided:   Self management tools provided:   Other plans: Concern that she may have a worsening of her blood pressure with weight gain and decreased exercise, continue to monitor.

## 2013-09-26 ENCOUNTER — Other Ambulatory Visit: Payer: Self-pay | Admitting: Otolaryngology

## 2013-10-06 ENCOUNTER — Other Ambulatory Visit: Payer: Self-pay | Admitting: Internal Medicine

## 2013-10-06 NOTE — Progress Notes (Signed)
Patient ID: [name], female   DOB: April 08, 1950, 63 y.o.   MRN: [id number] Called [name] to discuss her lab work.    Noted that her A1C now places her in the prediabetes category.  She preferred to discuss treatment options in person.   Discussed her TSH being low.  Will have her come in for repeat TSH, free T4 and T3.   She again noted her mood and family situation.  I again stressed to her my feeling that she should follow up with a counselor and/or psychiatrist, however, she noted that she did not like the last one and will not go back.  She is due to see family at [place].    Gilles Chiquito, MD

## 2013-10-06 NOTE — Addendum Note (Signed)
Addended by: Gilles Chiquito B on: 10/06/2013 02:55 PM   Modules accepted: Orders

## 2013-10-11 ENCOUNTER — Other Ambulatory Visit: Payer: Self-pay | Admitting: Internal Medicine

## 2013-10-13 NOTE — Telephone Encounter (Signed)
Called to pharm 

## 2013-10-14 ENCOUNTER — Encounter (HOSPITAL_BASED_OUTPATIENT_CLINIC_OR_DEPARTMENT_OTHER): Payer: Self-pay | Admitting: *Deleted

## 2013-10-14 NOTE — Progress Notes (Signed)
Bring all medications

## 2013-10-15 ENCOUNTER — Other Ambulatory Visit (INDEPENDENT_AMBULATORY_CARE_PROVIDER_SITE_OTHER): Payer: Medicare Other

## 2013-10-15 DIAGNOSIS — R7989 Other specified abnormal findings of blood chemistry: Secondary | ICD-10-CM

## 2013-10-15 DIAGNOSIS — R946 Abnormal results of thyroid function studies: Secondary | ICD-10-CM

## 2013-10-15 LAB — T3, FREE: T3 FREE: 3.1 pg/mL (ref 2.3–4.2)

## 2013-10-15 LAB — TSH: TSH: 1.026 u[IU]/mL (ref 0.350–4.500)

## 2013-10-15 LAB — T4, FREE: Free T4: 0.89 ng/dL (ref 0.80–1.80)

## 2013-10-20 ENCOUNTER — Ambulatory Visit (HOSPITAL_BASED_OUTPATIENT_CLINIC_OR_DEPARTMENT_OTHER): Payer: Medicare Other | Admitting: Anesthesiology

## 2013-10-20 ENCOUNTER — Encounter (HOSPITAL_BASED_OUTPATIENT_CLINIC_OR_DEPARTMENT_OTHER)
Admission: RE | Disposition: A | Discharge status: Home / Self Care | Payer: Self-pay | Source: Ambulatory Visit | Attending: Otolaryngology

## 2013-10-20 ENCOUNTER — Ambulatory Visit (HOSPITAL_BASED_OUTPATIENT_CLINIC_OR_DEPARTMENT_OTHER)
Admission: RE | Admit: 2013-10-20 | Discharge: 2013-10-20 | Disposition: A | Discharge status: Home / Self Care | Payer: Medicare Other | Source: Ambulatory Visit | Attending: Otolaryngology | Admitting: Otolaryngology

## 2013-10-20 ENCOUNTER — Encounter (HOSPITAL_BASED_OUTPATIENT_CLINIC_OR_DEPARTMENT_OTHER): Payer: Medicare Other | Admitting: Anesthesiology

## 2013-10-20 ENCOUNTER — Encounter (HOSPITAL_BASED_OUTPATIENT_CLINIC_OR_DEPARTMENT_OTHER): Payer: Self-pay

## 2013-10-20 DIAGNOSIS — J320 Chronic maxillary sinusitis: Secondary | ICD-10-CM | POA: Diagnosis not present

## 2013-10-20 DIAGNOSIS — J323 Chronic sphenoidal sinusitis: Secondary | ICD-10-CM | POA: Diagnosis not present

## 2013-10-20 DIAGNOSIS — H905 Unspecified sensorineural hearing loss: Secondary | ICD-10-CM | POA: Insufficient documentation

## 2013-10-20 DIAGNOSIS — J342 Deviated nasal septum: Secondary | ICD-10-CM | POA: Insufficient documentation

## 2013-10-20 DIAGNOSIS — J321 Chronic frontal sinusitis: Secondary | ICD-10-CM | POA: Diagnosis not present

## 2013-10-20 DIAGNOSIS — J322 Chronic ethmoidal sinusitis: Secondary | ICD-10-CM | POA: Diagnosis not present

## 2013-10-20 HISTORY — PX: SINUS ENDO W/FUSION: SHX777

## 2013-10-20 HISTORY — DX: Unspecified osteoarthritis, unspecified site: M19.90

## 2013-10-20 HISTORY — PX: SEPTOPLASTY: SHX2393

## 2013-10-20 LAB — POCT HEMOGLOBIN-HEMACUE: Hemoglobin: 13.2 g/dL (ref 12.0–15.0)

## 2013-10-20 SURGERY — SEPTOPLASTY, NOSE
Anesthesia: General | Site: Nose | Laterality: Bilateral

## 2013-10-20 MED ORDER — LIDOCAINE HCL (CARDIAC) 10 MG/ML IV SOLN
INTRAVENOUS | Status: DC | PRN
  Administered 2013-10-20: 80 mg via INTRAVENOUS

## 2013-10-20 MED ORDER — OXYCODONE HCL 5 MG PO TABS
ORAL_TABLET | ORAL | Status: AC
  Filled 2013-10-20: qty 1

## 2013-10-20 MED ORDER — OXYCODONE-ACETAMINOPHEN 5-325 MG PO TABS
1.0000 | ORAL_TABLET | ORAL | Status: DC | PRN
Start: 2013-10-20 — End: 2014-02-25

## 2013-10-20 MED ORDER — LIDOCAINE-EPINEPHRINE 1 %-1:100000 IJ SOLN
INTRAMUSCULAR | Status: AC
  Filled 2013-10-20: qty 1

## 2013-10-20 MED ORDER — BACITRACIN ZINC 500 UNIT/GM EX OINT
TOPICAL_OINTMENT | CUTANEOUS | Status: AC
  Filled 2013-10-20: qty 28.35

## 2013-10-20 MED ORDER — OXYMETAZOLINE HCL 0.05 % NA SOLN
NASAL | Status: AC
  Filled 2013-10-20: qty 15

## 2013-10-20 MED ORDER — OXYMETAZOLINE HCL 0.05 % NA SOLN
NASAL | Status: DC | PRN
  Administered 2013-10-20: 1 via NASAL

## 2013-10-20 MED ORDER — FENTANYL CITRATE 0.05 MG/ML IJ SOLN
INTRAMUSCULAR | Status: DC | PRN
  Administered 2013-10-20 (×3): 25 ug via INTRAVENOUS
  Administered 2013-10-20: 100 ug via INTRAVENOUS
  Administered 2013-10-20 (×2): 25 ug via INTRAVENOUS
  Administered 2013-10-20: 100 ug via INTRAVENOUS
  Administered 2013-10-20: 25 ug via INTRAVENOUS

## 2013-10-20 MED ORDER — MIDAZOLAM HCL 2 MG/2ML IJ SOLN: 1.0000 mg | INTRAMUSCULAR | Status: DC | PRN

## 2013-10-20 MED ORDER — HYDROMORPHONE HCL 1 MG/ML IJ SOLN: 0.2500 mg | INTRAMUSCULAR | Status: DC | PRN

## 2013-10-20 MED ORDER — LIDOCAINE-EPINEPHRINE 1 %-1:100000 IJ SOLN
INTRAMUSCULAR | Status: DC | PRN
  Administered 2013-10-20: 5.5 mL

## 2013-10-20 MED ORDER — FENTANYL CITRATE 0.05 MG/ML IJ SOLN
INTRAMUSCULAR | Status: AC
  Filled 2013-10-20: qty 10

## 2013-10-20 MED ORDER — ONDANSETRON HCL 4 MG/2ML IJ SOLN: 4.0000 mg | Freq: Once | INTRAMUSCULAR | Status: DC | PRN

## 2013-10-20 MED ORDER — CLINDAMYCIN PHOSPHATE 600 MG/50ML IV SOLN
INTRAVENOUS | Status: AC
  Filled 2013-10-20: qty 50

## 2013-10-20 MED ORDER — ONDANSETRON HCL 4 MG/2ML IJ SOLN
INTRAMUSCULAR | Status: DC | PRN
  Administered 2013-10-20: 4 mg via INTRAVENOUS

## 2013-10-20 MED ORDER — MUPIROCIN 2 % EX OINT
TOPICAL_OINTMENT | CUTANEOUS | Status: DC | PRN
  Administered 2013-10-20: 1 via NASAL

## 2013-10-20 MED ORDER — PROPOFOL 10 MG/ML IV BOLUS
INTRAVENOUS | Status: DC | PRN
Start: 2013-10-20 — End: 2013-10-20
  Administered 2013-10-20: 150 mg via INTRAVENOUS

## 2013-10-20 MED ORDER — MIDAZOLAM HCL 5 MG/5ML IJ SOLN
INTRAMUSCULAR | Status: DC | PRN
  Administered 2013-10-20: 2 mg via INTRAVENOUS

## 2013-10-20 MED ORDER — LACTATED RINGERS IV SOLN
INTRAVENOUS | Status: DC
  Administered 2013-10-20 (×3): via INTRAVENOUS

## 2013-10-20 MED ORDER — MIDAZOLAM HCL 2 MG/2ML IJ SOLN
INTRAMUSCULAR | Status: AC
  Filled 2013-10-20: qty 2

## 2013-10-20 MED ORDER — MUPIROCIN 2 % EX OINT
TOPICAL_OINTMENT | CUTANEOUS | Status: AC
  Filled 2013-10-20: qty 22

## 2013-10-20 MED ORDER — CLINDAMYCIN HCL 300 MG PO CAPS: 300.0000 mg | ORAL_CAPSULE | Freq: Three times a day (TID) | ORAL | Status: DC

## 2013-10-20 MED ORDER — PROPOFOL 10 MG/ML IV BOLUS
INTRAVENOUS | Status: AC
  Filled 2013-10-20: qty 20

## 2013-10-20 MED ORDER — SUCCINYLCHOLINE CHLORIDE 20 MG/ML IJ SOLN
INTRAMUSCULAR | Status: DC | PRN
  Administered 2013-10-20: 100 mg via INTRAVENOUS

## 2013-10-20 MED ORDER — OXYCODONE HCL 5 MG PO TABS
5.0000 mg | ORAL_TABLET | Freq: Once | ORAL | Status: AC | PRN
  Administered 2013-10-20: 5 mg via ORAL

## 2013-10-20 MED ORDER — FENTANYL CITRATE 0.05 MG/ML IJ SOLN: 50.0000 ug | INTRAMUSCULAR | Status: DC | PRN

## 2013-10-20 MED ORDER — EPHEDRINE SULFATE 50 MG/ML IJ SOLN
INTRAMUSCULAR | Status: DC | PRN
Start: 2013-10-20 — End: 2013-10-20
  Administered 2013-10-20: 10 mg via INTRAVENOUS
  Administered 2013-10-20: 25 mg via INTRAVENOUS

## 2013-10-20 MED ORDER — OXYCODONE HCL 5 MG/5ML PO SOLN: 5.0000 mg | Freq: Once | ORAL | Status: AC | PRN

## 2013-10-20 MED ORDER — DEXAMETHASONE SODIUM PHOSPHATE 4 MG/ML IJ SOLN
INTRAMUSCULAR | Status: DC | PRN
  Administered 2013-10-20: 10 mg via INTRAVENOUS

## 2013-10-20 MED ORDER — SODIUM CHLORIDE 0.9 % IV SOLN
INTRAVENOUS | Status: DC | PRN
  Administered 2013-10-20: 500 mL

## 2013-10-20 SURGICAL SUPPLY — 63 items
ATTRACTOMAT 16X20 MAGNETIC DRP (DRAPES) IMPLANT
BLADE ROTATE RAD 12 4 M4 (BLADE) IMPLANT
BLADE ROTATE RAD 12 4MM M4 (BLADE)
BLADE ROTATE RAD 40 4 M4 (BLADE) IMPLANT
BLADE ROTATE RAD 40 4MM M4 (BLADE)
BLADE ROTATE TRICUT 4MX13CM M4 (BLADE) ×1
BLADE ROTATE TRICUT 4X13 M4 (BLADE) ×3 IMPLANT
BLADE SURG 15 STRL LF DISP TIS (BLADE) IMPLANT
BLADE SURG 15 STRL SS (BLADE)
BLADE TRICUT ROTATE M4 4 5PK (BLADE) IMPLANT
BLADE TRICUT ROTATE M4 4MM 5PK (BLADE)
BUR HS RAD FRONTAL 3 (BURR) IMPLANT
BUR HS RAD FRONTAL 3MM (BURR)
CANISTER SUC SOCK COL 7IN (MISCELLANEOUS) ×6 IMPLANT
CANISTER SUCT 1200ML W/VALVE (MISCELLANEOUS) ×4 IMPLANT
COAGULATOR SUCT 8FR VV (MISCELLANEOUS) ×4 IMPLANT
DECANTER SPIKE VIAL GLASS SM (MISCELLANEOUS) IMPLANT
DRSG NASAL KENNEDY LMNT 8CM (GAUZE/BANDAGES/DRESSINGS) IMPLANT
DRSG NASOPORE 8CM (GAUZE/BANDAGES/DRESSINGS) ×2 IMPLANT
DRSG TELFA 3X8 NADH (GAUZE/BANDAGES/DRESSINGS) IMPLANT
ELECT REM PT RETURN 9FT ADLT (ELECTROSURGICAL) ×4
ELECTRODE REM PT RTRN 9FT ADLT (ELECTROSURGICAL) ×2 IMPLANT
GLOVE BIO SURGEON STRL SZ7.5 (GLOVE) ×4 IMPLANT
GLOVE SURG SS PI 7.0 STRL IVOR (GLOVE) ×2 IMPLANT
GOWN STRL REUS W/ TWL LRG LVL3 (GOWN DISPOSABLE) ×4 IMPLANT
GOWN STRL REUS W/TWL LRG LVL3 (GOWN DISPOSABLE) ×8
HEMOSTAT SURGICEL 2X14 (HEMOSTASIS) IMPLANT
IV NS 1000ML (IV SOLUTION)
IV NS 1000ML BAXH (IV SOLUTION) IMPLANT
IV NS 500ML (IV SOLUTION) ×4
IV NS 500ML BAXH (IV SOLUTION) ×2 IMPLANT
NDL HYPO 25X1 1.5 SAFETY (NEEDLE) ×2 IMPLANT
NDL SPNL 25GX3.5 QUINCKE BL (NEEDLE) IMPLANT
NEEDLE 27GAX1X1/2 (NEEDLE) ×4 IMPLANT
NEEDLE HYPO 25X1 1.5 SAFETY (NEEDLE) ×4 IMPLANT
NEEDLE SPNL 25GX3.5 QUINCKE BL (NEEDLE) IMPLANT
NS IRRIG 1000ML POUR BTL (IV SOLUTION) ×2 IMPLANT
PACK BASIN DAY SURGERY FS (CUSTOM PROCEDURE TRAY) ×4 IMPLANT
PACK ENT DAY SURGERY (CUSTOM PROCEDURE TRAY) ×4 IMPLANT
PAD DRESSING TELFA 3X8 NADH (GAUZE/BANDAGES/DRESSINGS) IMPLANT
PAD ENT ADHESIVE 25PK (MISCELLANEOUS) ×4 IMPLANT
SLEEVE SCD COMPRESS KNEE MED (MISCELLANEOUS) ×2 IMPLANT
SOLUTION BUTLER CLEAR DIP (MISCELLANEOUS) ×6 IMPLANT
SPLINT NASAL AIRWAY SILICONE (MISCELLANEOUS) IMPLANT
SPONGE GAUZE 2X2 8PLY STER LF (GAUZE/BANDAGES/DRESSINGS) ×1
SPONGE GAUZE 2X2 8PLY STRL LF (GAUZE/BANDAGES/DRESSINGS) ×3 IMPLANT
SPONGE NEURO XRAY DETECT 1X3 (DISPOSABLE) ×4 IMPLANT
SUT CHROMIC 4 0 P 3 18 (SUTURE) ×4 IMPLANT
SUT ETHILON 3 0 PS 1 (SUTURE) IMPLANT
SUT PLAIN 4 0 ~~LOC~~ 1 (SUTURE) ×4 IMPLANT
SUT PROLENE 3 0 PS 2 (SUTURE) ×2 IMPLANT
SUT VIC AB 4-0 P-3 18XBRD (SUTURE) IMPLANT
SUT VIC AB 4-0 P3 18 (SUTURE)
SYR 3ML 23GX1 SAFETY (SYRINGE) IMPLANT
TOWEL OR 17X24 6PK STRL BLUE (TOWEL DISPOSABLE) ×4 IMPLANT
TRACKER ENT INSTRUMENT (MISCELLANEOUS) ×4 IMPLANT
TRACKER ENT PATIENT (MISCELLANEOUS) ×4 IMPLANT
TUBE CONNECTING 20'X1/4 (TUBING) ×1
TUBE CONNECTING 20X1/4 (TUBING) ×3 IMPLANT
TUBE SALEM SUMP 12R W/ARV (TUBING) IMPLANT
TUBE SALEM SUMP 16 FR W/ARV (TUBING) ×4 IMPLANT
TUBING STRAIGHTSHOT EPS 5PK (TUBING) ×4 IMPLANT
YANKAUER SUCT BULB TIP NO VENT (SUCTIONS) ×4 IMPLANT

## 2013-10-20 NOTE — Anesthesia Preprocedure Evaluation (Signed)
Anesthesia Evaluation  Patient identified by MRN, date of birth, ID band Patient awake and Patient confused    Reviewed: Allergy & Precautions, H&P , NPO status , Patient's Chart, lab work & pertinent test results  Airway Mallampati: I TM Distance: >3 FB Neck ROM: Full    Dental  (+) Teeth Intact, Dental Advisory Given   Pulmonary  breath sounds clear to auscultation        Cardiovascular Rhythm:Regular Rate:Normal     Neuro/Psych    GI/Hepatic GERD-  Medicated and Controlled,  Endo/Other    Renal/GU      Musculoskeletal   Abdominal   Peds  Hematology   Anesthesia Other Findings   Reproductive/Obstetrics                           Anesthesia Physical Anesthesia Plan  ASA: II  Anesthesia Plan: General   Post-op Pain Management:    Induction: Intravenous  Airway Management Planned: Oral ETT  Additional Equipment:   Intra-op Plan:   Post-operative Plan: Extubation in OR  Informed Consent: I have reviewed the patients History and Physical, chart, labs and discussed the procedure including the risks, benefits and alternatives for the proposed anesthesia with the patient or authorized representative who has indicated his/her understanding and acceptance.   Dental advisory given  Plan Discussed with: CRNA, Surgeon and Anesthesiologist  Anesthesia Plan Comments:         Anesthesia Quick Evaluation

## 2013-10-20 NOTE — Brief Op Note (Signed)
10/20/2013  11:07 AM  PATIENT:  [name]  63 y.o. female  PRE-OPERATIVE DIAGNOSIS:  DEVIATED NASAL SEPTUM, BILATERAL ETHMOID SINUSITIS, MAXILLARY SINUSITIS, FRONTAL SINUSITIS, SPHENOID SINUSITIS, CHRONIC NASAL OBSTRUCTION  POST-OPERATIVE DIAGNOSIS:  DEVIATED NASAL SEPTUM, BILATERAL ETHMOID SINUSITIS, MAXILLARY SINUSITIS, FRONTAL SINUSITIS, SPHENOID SINUSITIS, CHRONIC NASAL OBSTRUCTION, BILATERAL INFERIOR TURBINATE HYPERTROPHY  PROCEDURE:  Procedure(s): 1. Bilateral endoscopic frontal sinusotomy with polyp removal 2. Septoplasty 3. Bilateral endoscopic total ethmoidectomy 4. Bilateral endoscopic maxillary antrostomy with polyp removal 5. Bilateral endoscopic sphenoidotomy with polyp removal 6. Bilateral partial inferior turbinate resection 7. FUSION stereotactic image guidance   SURGEON:  Surgeon(s) and Role:    * Ascencion Dike, MD - Primary  PHYSICIAN ASSISTANT:   ASSISTANTS: none   ANESTHESIA:   general  EBL:  Total I/O In: 2000 [I.V.:2000] Out: -   BLOOD ADMINISTERED:none  DRAINS: none   LOCAL MEDICATIONS USED:  LIDOCAINE   SPECIMEN:  Source of Specimen:  Bilateral sinus contents  DISPOSITION OF SPECIMEN:  PATHOLOGY  COUNTS:  YES  TOURNIQUET:  * No tourniquets in log *  DICTATION: .Other Dictation: Dictation Number [id number]  PLAN OF CARE: Discharge to home after PACU  PATIENT DISPOSITION:  PACU - hemodynamically stable.   Delay start of Pharmacological VTE agent (>24hrs) due to surgical blood loss or risk of bleeding: not applicable

## 2013-10-20 NOTE — H&P (Signed)
Cc: Chronic nasal obstruction, chronic sinusitis  HPI: The patient is a 63 year old female.  The patient was last seen 2 weeks ago.  At that time, she was complaining of right-sided hearing loss and recurrent rhinosinusitis.  The patient was noted to have right middle ear effusion, with associated conductive hearing loss.  In addition, she was also noted to have recurrent rhinosinusitis.  The patient was treated with Prednisone Dosepak, levofloxacin and Flonase Nasal Spray. According to the patient, her hearing has improved with the medical treatment. She also reports slight improvement in her sense of smell and taste.  However, she continues to have significant nasal congestion, recurrent headache, and facial pressure and pain.  It should be noted the patient has been treated with multiple courses of antibiotics over the past year.  The patient subsequently underwent a paranasal sinus CT scan.  The CT showed near total opacification of all paranasal sinuses.  Diffuse nasal opacification was also noted.  The findings were consistent with diffuse sinonasal polyposis.  No other ENT, GI, or respiratory issue noted since the last visit.   Objective General: Communicates without difficulty, well nourished, no acute distress. Head: Normocephalic, no evidence injury, no tenderness, facial buttresses intact without stepoff. Eyes: PERRL, EOMI. No scleral icterus, conjunctivae clear. Neuro: CN II exam reveals vision grossly intact.  No nystagmus at any point of gaze. Ears: Auricles well formed without lesions.  Ear canals are intact without mass or lesion.  No erythema or edema is appreciated.  The TMs are intact and mobile. Nose: External evaluation reveals normal support and skin without lesions.  Dorsum is intact.  Anterior rhinoscopy reveals congested and edematous mucosa over anterior aspect of the inferior turbinates and nasal septum. Middle meatus is not well visualized. Oral:  Oral cavity and oropharynx are  intact, symmetric, without erythema or edema.  Mucosa is moist without lesions. Neck: Full range of motion without pain.  There is no significant lymphadenopathy.  No masses palpable.  Thyroid bed within normal limits to palpation.  Parotid glands and submandibular glands equal bilaterally without mass.  Trachea is midline. Neuro:  CN 2-12 grossly intact. Gait normal. Vestibular: No nystagmus at any point of gaze.   Procedure:  Flexible Nasal Endoscopy: Risks, benefits, and alternatives of flexible endoscopy were explained to the patient.  Specific mention was made of the risk of throat numbness with difficulty swallowing, possible bleeding from the nose and mouth, and pain from the procedure.  The patient gave oral consent to proceed.  The nasal cavities were decongested and anesthetised with a combination of oxymetazoline and 4% lidocaine solution.  The flexible scope was inserted into the right nasal cavity.  Endoscopy of the inferior and middle meatus was performed.  The edematous mucosa was as described above.  Polypoid mucosa was noted.  Olfactory cleft was clear.  Nasopharynx was clear.  Turbinates were hypertrophied but without mass.  Incomplete response to decongestion.  The procedure was repeated on the contralateral side with similar findings.  The patient tolerated the procedure well.  Instructions were given to avoid eating or drinking for 2 hours.   AUDIOMETRIC TESTING:  Shows bilateral mild sensorineural hearing loss. The speech reception threshold is 25dB AD and 30dB AS. The discrimination score is 100% AD and 100% AS. The tympanogram is normal bilaterally.  Assessment 1.  Chronic bilateral pansinusitis. The patient's CT scan showed near complete opacification of all four paranasal sinuses.   2.  No purulent drainage is noted today.  However, the  patient continues to have severely edematous nasal mucosa, with polypoid tissue obstructing the middle meatus and all four paranasal sinuses.   3.   The patient's middle ear effusion and conductive hearing loss has resolved.  The patient is noted to have bilateral mild sensorineural hearing loss today.    Plan 1.  The physical exam and nasal endoscopy findings are reviewed with the patient.  The hearing test results are also discussed.   2.  The CT images are extensively reviewed with the patient.  3.  The patient should continue with her daily Flonase Nasal Spray. She is also encouraged to perform daily nasal saline irrigation.   4.  In light of her severe chronic pansinusitis, the patient will likely benefit from undergoing bilateral endoscopic sinus surgery.  In addition, she may also benefit from undergoing septoplasty to correct her bidirectional nasal septal deviation.   5.  The risks, benefits, alternatives and details of the procedures are reviewed with the patient.   6.  The patient would like to proceed with the procedures.

## 2013-10-20 NOTE — Transfer of Care (Signed)
Immediate Anesthesia Transfer of Care Note  Patient: [name]  Procedure(s) Performed: Procedure(s): SEPTOPLASTY (Bilateral) ENDOSCOPIC SINUS SURGERY WITH FUSION NAVIGATION, BILATERAL MAXILLARY ANTROSTOMIES, BILATERAL ETHMOIDECTOMIES, BILATERAL SPHENOIDECTOMIES, BILATERAL FRONTAL RECESS EXPLORATION (Bilateral)  Patient Location: PACU  Anesthesia Type:General  Level of Consciousness: awake, sedated and patient cooperative  Airway & Oxygen Therapy: Patient Spontanous Breathing and aerosol face mask  Post-op Assessment: Report given to PACU RN and Post -op Vital signs reviewed and stable  Post vital signs: Reviewed and stable  Complications: No apparent anesthesia complications

## 2013-10-20 NOTE — Anesthesia Procedure Notes (Signed)
Procedure Name: Intubation Date/Time: 10/20/2013 8:12 AM Performed by: Lyndee Leo Pre-anesthesia Checklist: Patient identified, Emergency Drugs available, Suction available and Patient being monitored Patient Re-evaluated:Patient Re-evaluated prior to inductionOxygen Delivery Method: Circle System Utilized Preoxygenation: Pre-oxygenation with 100% oxygen Intubation Type: IV induction Ventilation: Mask ventilation without difficulty Laryngoscope Size: Mac and 3 Grade View: Grade II Tube type: Oral Rae Tube size: 7.0 mm Number of attempts: 1 Airway Equipment and Method: stylet and oral airway Placement Confirmation: ETT inserted through vocal cords under direct vision,  positive ETCO2 and breath sounds checked- equal and bilateral Secured at: 20 cm Tube secured with: Tape Dental Injury: Teeth and Oropharynx as per pre-operative assessment

## 2013-10-20 NOTE — Discharge Instructions (Addendum)

## 2013-10-20 NOTE — Anesthesia Postprocedure Evaluation (Signed)
  Anesthesia Post-op Note  Patient: [name]  Procedure(s) Performed: Procedure(s): SEPTOPLASTY (Bilateral) ENDOSCOPIC SINUS SURGERY WITH FUSION NAVIGATION, BILATERAL MAXILLARY ANTROSTOMIES, BILATERAL ETHMOIDECTOMIES, BILATERAL SPHENOIDECTOMIES, BILATERAL FRONTAL RECESS EXPLORATION (Bilateral)  Patient Location: PACU  Anesthesia Type: General   Level of Consciousness: awake, alert  and oriented  Airway and Oxygen Therapy: Patient Spontanous Breathing  Post-op Pain: mild  Post-op Assessment: Post-op Vital signs reviewed  Post-op Vital Signs: Reviewed  Last Vitals:  Filed Vitals:   10/20/13 1230  BP: 134/79  Pulse: 104  Temp: 36.6 C  Resp: 16    Complications: No apparent anesthesia complications

## 2013-10-21 ENCOUNTER — Encounter (HOSPITAL_BASED_OUTPATIENT_CLINIC_OR_DEPARTMENT_OTHER): Payer: Self-pay | Admitting: Otolaryngology

## 2013-10-21 NOTE — Op Note (Signed)
[name], [name]         ACCOUNT NO.:  [id number]  MEDICAL RECORD NO.:  [id number]  LOCATION:                                 FACILITY:  PHYSICIAN:  Leta Baptist, MD            DATE OF BIRTH:  Nov 18, 1950  DATE OF PROCEDURE:  10/20/2013 DATE OF DISCHARGE:  10/20/2013                              OPERATIVE REPORT   SURGEON:  Leta Baptist, MD  PREOPERATIVE DIAGNOSES: 1. Bilateral chronic nasal obstruction. 2. Bidirectional nasal septal deviation. 3. Bilateral chronic pansinusitis with sinonasal polyps.  POSTOPERATIVE DIAGNOSES: 1. Bilateral chronic nasal obstruction. 2. Bidirectional nasal septal deviation. 3. Bilateral chronic pansinusitis with sinonasal polyps.  PROCEDURE PERFORMED: 1. Bilateral endoscopic frontal sinusotomy with polyp removal. 2. Septoplasty. 3. Bilateral endoscopic total ethmoidectomy. 4. Bilateral endoscopic maxillary antrostomy with polyp removal. 5. Bilateral endoscopic sphenoidotomy with polyp removal. 6. Bilateral partial inferior turbinate resection. 7. FUSION stereotactic image guidance.  ANESTHESIA:  General endotracheal tube anesthesia.  COMPLICATIONS:  None.  ESTIMATED BLOOD LOSS:  Less than 200 mL.  INDICATION FOR PROCEDURE:  The patient is a 63 year old female with a history of chronic nasal obstruction and bilateral chronic pansinusitis. The patient was previously treated with multiple courses of antibiotics, systemic and topical steroids, antihistamine, decongestant, and over-the- counter allergy medications.  However, she continues to be symptomatic. On her CT scan, the patient was noted to have bilateral pansinusitis, with opacification of all paranasal sinuses.  Her septum was also severely deviated.  Based on the above findings, the decision was made for patient to undergo the endoscopic sinus surgery and septoplasty procedures.  Intraoperatively, her inferior turbinates were also noted to be severely hypertrophied, obstructing  more than 90% of the inferior nasal cavity.  As a result, the decision was made to also perform the bilateral partial inferior turbinate resection procedure.  The risks, benefits, alternatives, and details of the planned procedures were discussed with the patient.  Questions were invited and answered. Informed consent was obtained.  DESCRIPTION:  The patient was taken to the operating room and placed supine on the operating table.  General endotracheal tube anesthesia was administered by the anesthesiologist.  Preop IV antibiotics and Decadron were given.  The patient was positioned and prepped and draped in a standard fashion for nasal surgery.  Pledgets soaked with Afrin were placed in both nasal cavities for vasoconstriction.  The pledgets were subsequently removed.  The FUSION stereotactic image guidance marker was placed.  The image guidance system was functional throughout the case.  Attention was first focused on the deviated septum.  A standard hemitransfixion incision was made on the left side.  The left-sided mucosal flap was elevated in a standard fashion.  A cartilaginous incision was made 1 cm superior to the caudal margin of the septum.  The contralateral mucosal flap was also elevated in a standard fashion.  The deviated portion of the cartilaginous and bony septum were removed.  The cartilage was morselized and replaced.  The septum was then quilted with plain gut sutures.  The incision was closed with interrupted chromic sutures.  Attention was first was then focused on the hypertrophied inferior turbinates.  The inferior one-half of each inferior  turbinate was crossclamped with a straight Kelly clamp.  The inferior one-half of each inferior turbinate was then resected with a pair of cross cutting scissors.  Hemostasis was achieved with suction electrocautery device.  Attention was then turned on the left paranasal sinuses.  The left middle turbinate was medialized,  exposing a large amount of polypoid tissue within the middle meatus.  The polypoid tissue was removed with a combination of Tru-Cut forceps, Blakesley forceps, and microdebrider. The uncinate process was then removed.  The maxillary antrum was entered and enlarged.  A large amount of polypoid tissue and mucopurulent drainage were removed from the left maxillary sinus.  The left anterior and posterior ethmoid cavities were then opened and removed with a combination of Tru-Cut forceps and microdebrider.  A large amount of polypoid tissue was also removed from the anterior and posterior ethmoid cavities.  The bony limit of the frontal recess was then opened, in order to expose the frontal sinus.  Mucopurulent drainage was also noted.  Polypoid tissue was removed from the frontal recess.  The opening to the sphenoid cavity was then entered with straight probe. The ethmoid opening was carefully enlarged.  A polypoid tissue was removed from the left sphenoid sinus.  The surgical sites were copiously irrigated.  Nasal pore packing was placed.  The same procedure was repeated on the right side without exception. Same findings were also noted within the right maxillary, ethmoid, sphenoid, and frontal sinuses.  Doyle splints were applied to the nasal septum bilaterally.  The care of the patient was turned over to the anesthesiologist.  The patient was awakened from anesthesia without difficulty.  She was extubated and transferred to the recovery room in good condition.  OPERATIVE FINDINGS: 1. Severe nasal septal deviation. 2. Bilateral inferior turbinate hypertrophy. 3. Bilateral chronic pansinusitis, with sinonasal polyps occupying     bilateral maxillary, ethmoid, frontal, and sphenoid sinuses.  SPECIMEN:  Bilateral sinus contents.  FOLLOWUP CARE:  The patient will be discharged home once she is awake and alert.  She will be placed on Percocet p.r.n. pain and clindamycin for 5  days.     Leta Baptist, MD     ST/MEDQ  D:  10/20/2013  T:  10/21/2013  Job:  [id number]

## 2013-11-09 ENCOUNTER — Other Ambulatory Visit: Payer: Self-pay | Admitting: Internal Medicine

## 2013-11-10 NOTE — Telephone Encounter (Signed)
Alprazolam rx called to [facility].

## 2013-12-12 ENCOUNTER — Other Ambulatory Visit: Payer: Self-pay | Admitting: Internal Medicine

## 2013-12-15 ENCOUNTER — Other Ambulatory Visit: Payer: Self-pay | Admitting: Internal Medicine

## 2013-12-17 ENCOUNTER — Ambulatory Visit (INDEPENDENT_AMBULATORY_CARE_PROVIDER_SITE_OTHER): Payer: Medicare Other | Admitting: Internal Medicine

## 2013-12-17 ENCOUNTER — Encounter: Payer: Self-pay | Admitting: Internal Medicine

## 2013-12-17 VITALS — BP 144/62 | HR 86 | Temp 98.0°F | Ht 60.5 in | Wt 197.8 lb

## 2013-12-17 DIAGNOSIS — Z23 Encounter for immunization: Secondary | ICD-10-CM

## 2013-12-17 DIAGNOSIS — F411 Generalized anxiety disorder: Secondary | ICD-10-CM

## 2013-12-17 DIAGNOSIS — I10 Essential (primary) hypertension: Secondary | ICD-10-CM

## 2013-12-17 DIAGNOSIS — E785 Hyperlipidemia, unspecified: Secondary | ICD-10-CM

## 2013-12-17 DIAGNOSIS — R7303 Prediabetes: Secondary | ICD-10-CM | POA: Insufficient documentation

## 2013-12-17 DIAGNOSIS — F330 Major depressive disorder, recurrent, mild: Secondary | ICD-10-CM

## 2013-12-17 DIAGNOSIS — M545 Low back pain, unspecified: Secondary | ICD-10-CM

## 2013-12-17 DIAGNOSIS — G4700 Insomnia, unspecified: Secondary | ICD-10-CM

## 2013-12-17 DIAGNOSIS — Z0000 Encounter for general adult medical examination without abnormal findings: Secondary | ICD-10-CM

## 2013-12-17 DIAGNOSIS — R7309 Other abnormal glucose: Secondary | ICD-10-CM

## 2013-12-17 DIAGNOSIS — N393 Stress incontinence (female) (male): Secondary | ICD-10-CM

## 2013-12-17 DIAGNOSIS — J321 Chronic frontal sinusitis: Secondary | ICD-10-CM

## 2013-12-17 MED ORDER — METFORMIN HCL 500 MG PO TABS: 500.0000 mg | ORAL_TABLET | Freq: Every day | ORAL | Status: DC

## 2013-12-17 MED ORDER — HYDROCODONE-ACETAMINOPHEN 10-325 MG PO TABS: 1.0000 | ORAL_TABLET | Freq: Four times a day (QID) | ORAL | Status: DC | PRN

## 2013-12-17 NOTE — Assessment & Plan Note (Signed)
Chronic issue and currently limiting her from exercising.  She has continued to gain weight, however, she admits that she has been eating much more than usual and not making good choices.  She has tried to change her diet, but has had limited success, she has started drinking more water.   Will give her a trial of [place] 10/325 #30 for 1 month to see if she is able to improve her exercise tolerance.

## 2013-12-17 NOTE — Assessment & Plan Note (Signed)
She had surgery in September with ENT.  Still has a panoply of symptoms which irritate her.  She reports that her surgeon thinks she is healing.

## 2013-12-17 NOTE — Assessment & Plan Note (Signed)
She is doing well on her xanax, anxiety is controlled. Continue current dose.

## 2013-12-17 NOTE — Assessment & Plan Note (Signed)
She does not want to start medications.  Will give her hand out on Kegel exercises to try.

## 2013-12-17 NOTE — Progress Notes (Signed)
Xanax 0.5 mg rx written 11/21 called to [facility].

## 2013-12-17 NOTE — Assessment & Plan Note (Signed)
Last LDL 62, but has fluctuated widely in the past.  Will check one more time next year and remove from problem list if stays normalized.

## 2013-12-17 NOTE — Assessment & Plan Note (Signed)
She takes occasional OTC sleep aid without issue.  Pain control may help with this issue also.

## 2013-12-17 NOTE — Patient Instructions (Signed)
General Instructions: Please schedule a follow up visit within the next 4 months.   For your medications:   Please bring all of your pill  Bottles with you to each visit.  This will help make sure that we have an up to date list of all the medications you are taking.  Please also bring any over the counter herbal medications you are taking (not including advil, tylenol, etc.)  Please continue taking all of your medications as prescribed.   You will be started on METFORMIN 519m daily.  More information below.  This is for your pre-diabetes.    You will also be started on hydrocodone-tylenol for your back pain, please let me know if this medication is not effective or makes you sleepy.   I have provided you information on Kegel exercises which can help with you incontinence.  Please let me know if your incontinence should worsen.   Thank you!   Treatment Goals:  Goals (1 Years of Data) as of 12/17/13      Lifestyle   . Prevent Falls       Progress Toward Treatment Goals:  Treatment Goal 12/17/2013  Blood pressure unchanged  Prevent falls -    Self Care Goals & Plans:  Self Care Goal 12/17/2013  Manage my medications take my medicines as prescribed; bring my medications to every visit; refill my medications on time  Monitor my health -  Eat healthy foods eat more vegetables; eat foods that are low in salt; eat baked foods instead of fried foods  Be physically active find an activity I enjoy; take a walk every day  Prevent falls -    No flowsheet data found.   Care Management & Community Referrals:  Referral 08/26/2012  Referrals made for care management support none needed  Referrals made to community resources -     Kegel Exercises The goal of Kegel exercises is to isolate and exercise your pelvic floor muscles. These muscles act as a hammock that supports the rectum, vagina, small intestine, and uterus. As the muscles weaken, the hammock sags and these organs are  displaced from their normal positions. Kegel exercises can strengthen your pelvic floor muscles and help you to improve bladder and bowel control, improve sexual response, and help reduce many problems and some discomfort during pregnancy. Kegel exercises can be done anywhere and at any time. HOW TO PERFORM KEGEL EXERCISES 1. Locate your pelvic floor muscles. To do this, squeeze (contract) the muscles that you use when you try to stop the flow of urine. You will feel a tightness in the vaginal area (women) and a tight lift in the rectal area (men and women). 2. When you begin, contract your pelvic muscles tight for 2-5 seconds, then relax them for 2-5 seconds. This is one set. Do 4-5 sets with a short pause in between. 3. Contract your pelvic muscles for 8-10 seconds, then relax them for 8-10 seconds. Do 4-5 sets. If you cannot contract your pelvic muscles for 8-10 seconds, try 5-7 seconds and work your way up to 8-10 seconds. Your goal is 4-5 sets of 10 contractions each day. Keep your stomach, buttocks, and legs relaxed during the exercises. Perform sets of both short and long contractions. Vary your positions. Perform these contractions 3-4 times per day. Perform sets while you are:   Lying in bed in the morning.  Standing at lunch.  Sitting in the late afternoon.  Lying in bed at night. You should do 40-50 contractions  per day. Do not perform more Kegel exercises per day than recommended. Overexercising can cause muscle fatigue. Continue these exercises for for at least 15-20 weeks or as directed by your caregiver. Document Released: 12/27/2011 Document Reviewed: 12/27/2011 [facility] Patient Information 2015 [place]. This information is not intended to replace advice given to you by your health care provider. Make sure you discuss any questions you have with your health care provider.  Metformin tablets What is this medicine? METFORMIN (met FOR min) is used to treat type 2 diabetes.  It helps to control blood sugar. Treatment is combined with diet and exercise. This medicine can be used alone or with other medicines for diabetes. This medicine may be used for other purposes; ask your health care provider or pharmacist if you have questions. COMMON BRAND NAME(S): Glucophage What should I tell my health care provider before I take this medicine? They need to know if you have any of these conditions: -anemia -frequently drink alcohol-containing beverages -become easily dehydrated -heart attack -heart failure that is treated with medications -kidney disease -liver disease -polycystic ovary syndrome -serious infection or injury -vomiting -an unusual or allergic reaction to metformin, other medicines, foods, dyes, or preservatives -pregnant or trying to get pregnant -breast-feeding How should I use this medicine? Take this medicine by mouth. Take it with meals. Swallow the tablets with a drink of water. Follow the directions on the prescription label. Take your medicine at regular intervals. Do not take your medicine more often than directed. Talk to your pediatrician regarding the use of this medicine in children. While this drug may be prescribed for children as young as 72 years of age for selected conditions, precautions do apply. Overdosage: If you think you have taken too much of this medicine contact a poison control center or emergency room at once. NOTE: This medicine is only for you. Do not share this medicine with others. What if I miss a dose? If you miss a dose, take it as soon as you can. If it is almost time for your next dose, take only that dose. Do not take double or extra doses. What may interact with this medicine? Do not take this medicine with any of the following medications: -dofetilide -gatifloxacin -certain contrast medicines given before X-rays, CT scans, MRI, or other procedures This medicine may also interact with the following  medications: -digoxin -diuretics -female hormones, like estrogens or progestins and birth control pills -isoniazid -medicines for blood pressure, heart disease, irregular heart beat -morphine -nicotinic acid -phenothiazines like chlorpromazine, mesoridazine, prochlorperazine, thioridazine -phenytoin -procainamide -quinidine -quinine -ranitidine -steroid medicines like prednisone or cortisone -stimulant medicines for attention disorders, weight loss, or to stay awake -thyroid medicines -trimethoprim -vancomycin This list may not describe all possible interactions. Give your health care provider a list of all the medicines, herbs, non-prescription drugs, or dietary supplements you use. Also tell them if you smoke, drink alcohol, or use illegal drugs. Some items may interact with your medicine. What should I watch for while using this medicine? Visit your doctor or health care professional for regular checks on your progress. A test called the HbA1C (A1C) will be monitored. This is a simple blood test. It measures your blood sugar control over the last 2 to 3 months. You will receive this test every 3 to 6 months. Learn how to check your blood sugar. Learn the symptoms of low and high blood sugar and how to manage them. Always carry a quick-source of sugar with you in case  you have symptoms of low blood sugar. Examples include hard sugar candy or glucose tablets. Make sure others know that you can choke if you eat or drink when you develop serious symptoms of low blood sugar, such as seizures or unconsciousness. They must get medical help at once. Tell your doctor or health care professional if you have high blood sugar. You might need to change the dose of your medicine. If you are sick or exercising more than usual, you might need to change the dose of your medicine. Do not skip meals. Ask your doctor or health care professional if you should avoid alcohol. Many nonprescription cough and  cold products contain sugar or alcohol. These can affect blood sugar. This medicine may cause ovulation in premenopausal women who do not have regular monthly periods. This may increase your chances of becoming pregnant. You should not take this medicine if you become pregnant or think you may be pregnant. Talk with your doctor or health care professional about your birth control options while taking this medicine. Contact your doctor or health care professional right away if think you are pregnant. If you are going to need surgery, a MRI, CT scan, or other procedure, tell your doctor that you are taking this medicine. You may need to stop taking this medicine before the procedure. Wear a medical ID bracelet or chain, and carry a card that describes your disease and details of your medicine and dosage times. What side effects may I notice from receiving this medicine? Side effects that you should report to your doctor or health care professional as soon as possible: -allergic reactions like skin rash, itching or hives, swelling of the face, lips, or tongue -breathing problems -feeling faint or lightheaded, falls -muscle aches or pains -signs and symptoms of low blood sugar such as feeling anxious, confusion, dizziness, increased hunger, unusually weak or tired, sweating, shakiness, cold, irritable, headache, blurred vision, fast heartbeat, loss of consciousness -slow or irregular heartbeat -unusual stomach pain or discomfort -unusually tired or weak Side effects that usually do not require medical attention (report to your doctor or health care professional if they continue or are bothersome): -diarrhea -headache -heartburn -metallic taste in mouth -nausea -stomach gas, upset This list may not describe all possible side effects. Call your doctor for medical advice about side effects. You may report side effects to FDA at 1-800-FDA-1088. Where should I keep my medicine? Keep out of the reach of  children. Store at room temperature between 15 and 30 degrees C (59 and 86 degrees F). Protect from moisture and light. Throw away any unused medicine after the expiration date. NOTE: This sheet is a summary. It may not cover all possible information. If you have questions about this medicine, talk to your doctor, pharmacist, or health care provider.  2015, [employer]. (2012-04-23 16:03:44)

## 2013-12-17 NOTE — Assessment & Plan Note (Signed)
A1C 6.4.  She has agreed to starting low dose metformin.  Start 500mg  daily.  We discussed side effects.

## 2013-12-17 NOTE — Assessment & Plan Note (Signed)
Stable mood today, symptoms seem to be well controlled on lexapro.  I have attempted to get her to see a psychiatrist again, but she has refused noting that "talking doesn't help anything."

## 2013-12-17 NOTE — Assessment & Plan Note (Signed)
BP Readings from Last 3 Encounters:  12/17/13 144/62  10/20/13 134/79  09/24/13 138/82    Lab Results  Component Value Date   NA 142 09/24/2013   K 3.7 09/24/2013   CREATININE 0.63 09/24/2013    Assessment: Blood pressure control: mildly elevated Progress toward BP goal:  unchanged Comments: Likely just a variation of her normal, very mildly elevated.   Plan: Medications:  continue current medications Educational resources provided:   Self management tools provided:   Other plans: Renal function is stable from September

## 2013-12-17 NOTE — Progress Notes (Signed)
Subjective:    Patient ID: [name], female    DOB: 02/11/1950, 63 y.o.   MRN: [id number]  CC: Routine follow up  HPI  [name] is a 63yo woman with PMH of chronic sinusitis (recent surgery), HLD, migraine, HTN, chronic pain, depression who presents for follow up.   Since I last saw her, [name] underwent surgery with ENT for chronic sinusitis.  She reports that the surgery was "awful"  And she is still healing and she itches. She reports her ENT doctor still says that it is healing.  She is having headaches still, and feels like there is still congestion.  Still can't taste or smell much.  The not being able to taste much is bothering her a lot.  Continues to complain of weight gain, due to overeating and not exercising.  Most of the weight has been in her middle.    She is trying to drink more water for weight loss.  She has been trying to be vegan, but has trouble not eating cheese.  She is vegetarian.  She is getting tofurkey for tomorrow (Thanksgiving).  She has a moral objection to how animals are raised that has led her to be a vegetarian.    Since having her surgery she has been sneezing more and notices a small amount of urine loss with sneezing.  She is wearing panty liners.  This is not very concerning to her today, she will let me know if it gets worse.  She is not interested in starting a medication.   She is not smoking.   She is having intermittent back pain.  She takes advil which sometimes work.    We reviewed her medications.     Current Outpatient Prescriptions on File Prior to Visit  Medication Sig Dispense Refill  . ALPRAZolam (XANAX) 0.5 MG tablet TAKE ONE TABLET BY MOUTH TWICE DAILY AS NEEDED AND ONE TO TWO AT BEDTIME AS NEEDED FOR ANXIETY AND/OR SLEEP 120 tablet 0  . DIPHENHYDRAMINE HCL, SLEEP, PO Take 25 mg by mouth at bedtime as needed.    Marland Kitchen escitalopram (LEXAPRO) 20 MG tablet Take 1 tablet (20 mg total) by mouth daily. 90 tablet 2  .  omeprazole (PRILOSEC) 20 MG capsule TAKE ONE CAPSULE BY MOUTH ONCE DAILY 30 capsule 0  . clindamycin (CLEOCIN) 300 MG capsule Take 1 capsule (300 mg total) by mouth 3 (three) times daily. (Patient not taking: Reported on 12/17/2013) 15 capsule 0  . oxyCODONE-acetaminophen (ROXICET) 5-325 MG per tablet Take 1 tablet by mouth every 4 (four) hours as needed for moderate pain or severe pain. (Patient not taking: Reported on 12/17/2013) 25 tablet 0  . [DISCONTINUED] exenatide (BYETTA 5 MCG PEN) 5 MCG/0.02ML SOLN Inject 0.02 mLs (5 mcg total) into the skin 2 (two) times daily with a meal. 1.2 mL 3  . [DISCONTINUED] famotidine (PEPCID) 40 MG tablet Take 1 tablet (40 mg total) by mouth daily. 30 tablet 11   No current facility-administered medications on file prior to visit.      Review of Systems  Constitutional: Negative for fever and chills.       Weight gain  HENT: Positive for congestion and sinus pressure. Negative for dental problem and nosebleeds.   Respiratory: Negative for shortness of breath.   Cardiovascular: Negative for chest pain.  Endocrine: Positive for polyphagia.  Genitourinary: Negative for dysuria and flank pain.       Occasional stress incontinence.   Musculoskeletal: Positive for back pain.  Negative for gait problem.  Skin: Negative for rash and wound.  Neurological: Positive for headaches. Negative for dizziness and light-headedness.  Psychiatric/Behavioral: Negative for confusion, dysphoric mood and decreased concentration.       Objective:   Physical Exam  Constitutional: She is oriented to person, place, and time.  Obese, NAD  HENT:  Head: Normocephalic and atraumatic.  Wearing glasses  Neck: Neck supple.  Cardiovascular: Normal rate, regular rhythm and normal heart sounds.   No murmur heard. Pulmonary/Chest: Effort normal and breath sounds normal. No respiratory distress. She has no wheezes.  Abdominal: Soft. Bowel sounds are normal.  Musculoskeletal: She  exhibits no edema or tenderness.  Neurological: She is alert and oriented to person, place, and time. No cranial nerve deficit.  Skin: Skin is warm and dry. No erythema.  Psychiatric: She has a normal mood and affect. Her behavior is normal.  Vitals reviewed.   No labs today      Assessment & Plan:  RTC 4 months, sooner if needed

## 2013-12-17 NOTE — Assessment & Plan Note (Signed)
Colonoscopy: 2010 - diverticulosis, no polyps DEXA - 2008, osteopenia in the hip.  Due to diet, would recommend repeat and Ca/Vit D supplementation, she continues to refuse MMG - 04/2011, negative, refuses repeat PAP - 05/2011, inadequate, refuses repeat\  Flu: done this year per patient.  Pneumovax today Tetanus: discuss at next visit.  Zostavax: due, to discuss

## 2014-01-17 ENCOUNTER — Other Ambulatory Visit: Payer: Self-pay | Admitting: Internal Medicine

## 2014-01-20 ENCOUNTER — Other Ambulatory Visit: Payer: Self-pay | Admitting: Internal Medicine

## 2014-01-20 ENCOUNTER — Telehealth: Payer: Self-pay | Admitting: *Deleted

## 2014-01-20 ENCOUNTER — Other Ambulatory Visit: Payer: Self-pay | Admitting: Oncology

## 2014-01-20 NOTE — Telephone Encounter (Signed)
Alprazolam needs to be 120, pharmacist noted this when it was called in and it appears pt usually does get 120

## 2014-01-20 NOTE — Telephone Encounter (Signed)
Called to pharm 

## 2014-01-21 MED ORDER — ALPRAZOLAM 0.5 MG PO TABS: ORAL_TABLET | ORAL | Status: DC

## 2014-01-21 NOTE — Telephone Encounter (Signed)
Rx called in 

## 2014-02-17 ENCOUNTER — Other Ambulatory Visit: Payer: Self-pay | Admitting: *Deleted

## 2014-02-17 DIAGNOSIS — F419 Anxiety disorder, unspecified: Secondary | ICD-10-CM

## 2014-02-17 DIAGNOSIS — F329 Major depressive disorder, single episode, unspecified: Secondary | ICD-10-CM

## 2014-02-17 DIAGNOSIS — F32A Depression, unspecified: Secondary | ICD-10-CM

## 2014-02-17 MED ORDER — OMEPRAZOLE 20 MG PO CPDR: 20.0000 mg | DELAYED_RELEASE_CAPSULE | Freq: Every day | ORAL | Status: DC

## 2014-02-17 MED ORDER — ALPRAZOLAM 0.5 MG PO TABS: ORAL_TABLET | ORAL | Status: DC

## 2014-02-17 MED ORDER — ESCITALOPRAM OXALATE 20 MG PO TABS: 20.0000 mg | ORAL_TABLET | Freq: Every day | ORAL | Status: DC

## 2014-02-17 NOTE — Telephone Encounter (Signed)
Rx called in to pharmacy. 

## 2014-02-25 ENCOUNTER — Encounter: Payer: Self-pay | Admitting: Licensed Clinical Social Worker

## 2014-02-25 ENCOUNTER — Encounter: Payer: Self-pay | Admitting: Internal Medicine

## 2014-02-25 ENCOUNTER — Ambulatory Visit (INDEPENDENT_AMBULATORY_CARE_PROVIDER_SITE_OTHER): Payer: Medicare Other | Admitting: Internal Medicine

## 2014-02-25 VITALS — BP 164/81 | HR 87 | Temp 97.4°F | Ht 60.5 in | Wt 197.8 lb

## 2014-02-25 DIAGNOSIS — E785 Hyperlipidemia, unspecified: Secondary | ICD-10-CM

## 2014-02-25 DIAGNOSIS — R7309 Other abnormal glucose: Secondary | ICD-10-CM

## 2014-02-25 DIAGNOSIS — M545 Low back pain, unspecified: Secondary | ICD-10-CM

## 2014-02-25 DIAGNOSIS — F411 Generalized anxiety disorder: Secondary | ICD-10-CM

## 2014-02-25 DIAGNOSIS — F419 Anxiety disorder, unspecified: Secondary | ICD-10-CM

## 2014-02-25 DIAGNOSIS — J321 Chronic frontal sinusitis: Secondary | ICD-10-CM

## 2014-02-25 DIAGNOSIS — F32A Depression, unspecified: Secondary | ICD-10-CM

## 2014-02-25 DIAGNOSIS — I10 Essential (primary) hypertension: Secondary | ICD-10-CM

## 2014-02-25 DIAGNOSIS — E8881 Metabolic syndrome: Secondary | ICD-10-CM

## 2014-02-25 DIAGNOSIS — R7303 Prediabetes: Secondary | ICD-10-CM

## 2014-02-25 DIAGNOSIS — F329 Major depressive disorder, single episode, unspecified: Secondary | ICD-10-CM | POA: Diagnosis not present

## 2014-02-25 LAB — LIPID PANEL
CHOLESTEROL: 152 mg/dL (ref 0–200)
HDL: 41 mg/dL (ref >39)
LDL CALC: 75 mg/dL (ref 0–99)
Total CHOL/HDL Ratio: 3.7 Ratio
Triglycerides: 178 mg/dL — ABNORMAL HIGH (ref <150)
VLDL: 36 mg/dL (ref 0–40)

## 2014-02-25 MED ORDER — HYDROCODONE-ACETAMINOPHEN 10-325 MG PO TABS: 1.0000 | ORAL_TABLET | Freq: Four times a day (QID) | ORAL | Status: DC | PRN

## 2014-02-25 MED ORDER — OMEPRAZOLE 20 MG PO CPDR: 20.0000 mg | DELAYED_RELEASE_CAPSULE | Freq: Every day | ORAL | Status: DC

## 2014-02-25 MED ORDER — ALPRAZOLAM 0.5 MG PO TABS: ORAL_TABLET | ORAL | Status: DC

## 2014-02-25 MED ORDER — ESCITALOPRAM OXALATE 20 MG PO TABS: 20.0000 mg | ORAL_TABLET | Freq: Every day | ORAL | Status: DC

## 2014-02-25 NOTE — Progress Notes (Signed)
Subjective:    Patient ID: [name], female    DOB: 02-Jul-1950, 64 y.o.   MRN: [id number]  CC: Routine follow up visit  HPI  [name] is a 64yo woman with PMH of chronic sinusitis, HLD, HTN, chronic pain, depression/anxiety, pre-DM, GERD, insomnia who presents today for routine follow up.   Shoulder pain: worse with movement, back of shoulder.  She is left handed, on the left.   No neuro changes, "just hurts"   She is not sure when it got bad, but it seems to be flaring off and on.  She is not interesting in an injection or imaging.   Vicoden helped her get some sleep somewhat for her back pain.  She has a new mattress to help with her back pain.  However, she notes that a friend of hers bought it for her and now she is stressed out about how to pay her back.   Social situation: Lots of financial issues still.  She had been getting money from a significant other, this is not happening anymore.  She reports only making about $1400 a month, but her rent is $800 a month. She has been trying to limit her expenditures, but when she comes in to some money (keeping dogs) she spends it immediately. She became tearful speaking about this subject.  She also brought up her daughter who continues to keep her distance and not speak with her.  This was the main thing we discussed today.   When speaking about taking medications, she notes that the metformin made her sick to her stomach so she quit taking it.  She is only taking reflux medications, her anxiety and depression meds.  She continues to have passive suicidal ideations (no plan) where she notes that it would be "ok" if she passed in her sleep or if she got cancer. She declines any health maintenance planning.    I spent > 50% of the time on counseling today.  I am having our SW speak with her during the visit to help with housing, financial options.  Physical exam was limited.   Review of Systems  Constitutional: Positive for  activity change and appetite change (decreased appetite, but binge type eating). Negative for fever and unexpected weight change.  HENT: Positive for congestion and sinus pressure. Negative for ear discharge and ear pain.   Eyes: Negative for photophobia and redness.  Respiratory: Negative for cough and shortness of breath.   Cardiovascular: Negative for chest pain and palpitations.  Genitourinary: Negative for dysuria and difficulty urinating.  Musculoskeletal: Positive for back pain and arthralgias. Negative for joint swelling and neck stiffness.  Skin: Negative for rash and wound.  Neurological: Negative for dizziness, light-headedness and headaches.  Psychiatric/Behavioral: Positive for sleep disturbance, dysphoric mood and decreased concentration. Negative for self-injury. The patient is nervous/anxious.        Objective:   Physical Exam  Constitutional: She is oriented to person, place, and time. She appears well-developed and well-nourished.  Tearful during interview  HENT:  Head: Normocephalic and atraumatic.  Eyes: Conjunctivae are normal. No scleral icterus.  Pulmonary/Chest:  Breathing comfortably in room  Neurological: She is alert and oriented to person, place, and time.  Psychiatric: Her speech is normal and behavior is normal. Thought content normal. Her mood appears anxious. Cognition and memory are normal. She exhibits a depressed mood.  Nursing note and vitals reviewed.  Lipid panel today.      Assessment & Plan:  RTC in  2 weeks for BP check then in 2 months for routine visit.

## 2014-02-25 NOTE — Assessment & Plan Note (Signed)
She is not taking metformin as prescribed.  Advised her to attempt to restart, taking the medication at night to avoid nausea.  Also discussed diet and lifestyle changes.   I think a large barrier to her losing weight or exercising is her depression and financial stress.

## 2014-02-25 NOTE — Assessment & Plan Note (Signed)
Chronic, also now with shoulder pain, appears to be musculoskeletal.  She will have a one time Rx for vicoden.  Despite her social and mental health situation, she has been appropriate with Vicoden before.  If any red flags appear, will cease prescribing.

## 2014-02-25 NOTE — Assessment & Plan Note (Addendum)
Check lipid panel today.  If controlled, will remove from problem list.

## 2014-02-25 NOTE — Assessment & Plan Note (Signed)
BP Readings from Last 3 Encounters:  02/25/14 164/81  12/17/13 144/62  10/20/13 134/79    Lab Results  Component Value Date   NA 142 09/24/2013   K 3.7 09/24/2013   CREATININE 0.63 09/24/2013    Assessment: Blood pressure control: mildly elevated Progress toward BP goal:  deteriorated Comments: Checked first after walking in and then after getting very worked up talking about her financial situation.    Plan: Medications:  Currently on no medications Educational resources provided:   Self management tools provided:   Other plans: Return to clinic in 2 weeks for BP check.  If continues to be elevated would either start HCTZ, or depending on results of MAU/Cr, lisinopril at a low dose.

## 2014-02-25 NOTE — Patient Instructions (Signed)
General Instructions: Please schedule a follow up visit within the next 2 weeks for blood pressure check and then in 2 months with Dr. Daryll Drown for routine follow up. .   For your medications:   Please bring all of your pill  Bottles with you to each visit.  This will help make sure that we have an up to date list of all the medications you are taking.  Please also bring any over the counter herbal medications you are taking (not including advil, tylenol, etc.)  Please continue taking your medications as prescribed.  It would be best if you attempt to take the metformin for your pre-diabetes.  You can take it with food or at night to help with the nausea.   Today you will get refills of your anxiety and depression medications and Vicoden for back and shoulder pain.    Thank you!     Treatment Goals:  Goals (1 Years of Data) as of 02/25/14      Lifestyle   . Prevent Falls       Progress Toward Treatment Goals:  Treatment Goal 02/25/2014  Blood pressure deteriorated  Prevent falls -    Self Care Goals & Plans:  Self Care Goal 02/25/2014  Manage my medications take my medicines as prescribed; bring my medications to every visit; refill my medications on time  Monitor my health -  Eat healthy foods eat foods that are low in salt; eat baked foods instead of fried foods; eat more vegetables; eat smaller portions  Be physically active find an activity I enjoy; take a walk every day  Prevent falls -    No flowsheet data found.   Care Management & Community Referrals:  Referral 02/25/2014  Referrals made for care management support social worker; financial counselor  Referrals made to community resources -

## 2014-02-25 NOTE — Progress Notes (Signed)
[name] was referred to CSW as pt voiced complaint regarding cost of housing and finances being stretched thin.  CSW met with [name] during her scheduled [facility] appointment.  Pt is insured with a Medicare replacement plan with dental and Medicaid.  [name] has not applied for food stamps, as last time she inquired she received only $7/month.  [name] rents a house and lives with her two rescue greyhound dogs.  Pt's rent is $810/month, plus electric, oil and water.  In addition to her living expenses, [name] has the following credit cards with balances: [employer],  [employer] Care, Care Credit and a Vet bill.  [name]'s main concern is finding housing cheaper than her current amount.   CSW provided [name] with rental apartment listing within income guidelines. Information on: [place] - to confirm pt's Medicare plan provides coverage at a reasonable rate, Community Nutrition program and sites - as pt states she often has no money left for food.  CSW offered [name] items from [facility] pantry, pt declined.  Hospice brochure - pt has close friend that is currently on hospice.  CSW encouraged [name] to take advantage of groups and individual services offered by Hospice.  Pt denies add'l social work needs at this time.

## 2014-02-25 NOTE — Assessment & Plan Note (Signed)
She reports that her symptoms are not much improved after surgery.  She is not going to see the ENT specialist again until her bill there is paid.

## 2014-02-25 NOTE — Assessment & Plan Note (Signed)
Our main discussion today focused around anxiety/depression and her financial situation.  I believe that her anxiety/depression are exacerbated significantly when her finances are unstable.  Over the last year, she did seem to improve in this area with keeping dogs and her mood improved and she became more positive.  However, recently, her finances have worsened again.    Will continue her xanax as previously ordered.   I have ask Golden Hurter, our SW to see the patient today during her visit.  I have also advised her to speak with our financial advisor, [place].  She has struggle accepting that she made need to sign up for assistance programs, but I think this may be the best option in helping her other medical conditions.   She seems to have issues with binge eating as well, this probably plays a part in her anxiety.  I continue to advise her to see a psychiatrist, but she is adamantly opposed to this idea.

## 2014-02-26 LAB — MICROALBUMIN / CREATININE URINE RATIO
Creatinine, Urine: 160.1 mg/dL
MICROALB UR: 0.8 mg/dL (ref <2.0)
Microalb Creat Ratio: 5 mg/g (ref 0.0–30.0)

## 2014-03-05 ENCOUNTER — Encounter: Payer: Self-pay | Admitting: Internal Medicine

## 2014-03-11 ENCOUNTER — Encounter: Payer: Self-pay | Admitting: Internal Medicine

## 2014-03-11 ENCOUNTER — Ambulatory Visit (INDEPENDENT_AMBULATORY_CARE_PROVIDER_SITE_OTHER): Payer: Medicare Other | Admitting: Internal Medicine

## 2014-03-11 VITALS — BP 112/70 | HR 90 | Temp 98.6°F | Ht 60.5 in | Wt 195.4 lb

## 2014-03-11 DIAGNOSIS — E785 Hyperlipidemia, unspecified: Secondary | ICD-10-CM

## 2014-03-11 DIAGNOSIS — I10 Essential (primary) hypertension: Secondary | ICD-10-CM | POA: Diagnosis not present

## 2014-03-11 NOTE — Progress Notes (Signed)
   Subjective:    Patient ID: [name], female    DOB: 1950/10/10, 64 y.o.   MRN: [id number]  CC: Blood pressure recheck  HPI  Ms. Showers is here for BP recheck and today it is well controlled.  She likely has a component of anxiety when her blood pressure is elevated in clinic.    Her blood work was discussed including lipid panel and MAU/Cr which were within normal limits.  I answered all of her questions today.   Review of Systems  Constitutional: Negative for activity change and appetite change.  Neurological: Negative for dizziness, light-headedness and headaches.  Psychiatric/Behavioral: Positive for dysphoric mood. Negative for hallucinations and confusion. The patient is nervous/anxious.       Objective:   Physical Exam  Constitutional: She is oriented to person, place, and time. She appears well-developed and well-nourished. No distress.  HENT:  Head: Normocephalic and atraumatic.  Neurological: She is alert and oriented to person, place, and time. No cranial nerve deficit.  Psychiatric:  She regularly has a blunted affect and today this is no different.  Her mood is low, but his is normal.  She is within normal for her.    No labs today    Assessment & Plan:  RTC in 6 weeks to 2 months for routine follow up.

## 2014-03-11 NOTE — Assessment & Plan Note (Signed)
Well controlled today.  Will not start medications.  Continue to monitor.

## 2014-03-11 NOTE — Assessment & Plan Note (Signed)
Lipid panel is WNL.  She likely does not have problems with cholesterol anymore.

## 2014-05-19 DIAGNOSIS — H6122 Impacted cerumen, left ear: Secondary | ICD-10-CM | POA: Diagnosis not present

## 2014-05-19 DIAGNOSIS — J0121 Acute recurrent ethmoidal sinusitis: Secondary | ICD-10-CM | POA: Diagnosis not present

## 2014-05-19 DIAGNOSIS — H66012 Acute suppurative otitis media with spontaneous rupture of ear drum, left ear: Secondary | ICD-10-CM | POA: Diagnosis not present

## 2014-05-20 ENCOUNTER — Telehealth: Payer: Self-pay | Admitting: *Deleted

## 2014-05-20 NOTE — Telephone Encounter (Signed)
Pt called to check if paperwork on meds from [place] has been completed to start home delivery. Talked with CCyndi Bender - unable to find paperwork. Pt will ask insurance company to refax. Hilda Blades Patsy Zaragoza RN 05/20/14 3:30PM

## 2014-05-23 ENCOUNTER — Encounter: Payer: Self-pay | Admitting: Internal Medicine

## 2014-05-28 ENCOUNTER — Other Ambulatory Visit: Payer: Self-pay | Admitting: *Deleted

## 2014-05-28 ENCOUNTER — Encounter: Payer: Self-pay | Admitting: Internal Medicine

## 2014-05-28 DIAGNOSIS — K219 Gastro-esophageal reflux disease without esophagitis: Secondary | ICD-10-CM | POA: Insufficient documentation

## 2014-06-10 DIAGNOSIS — H9072 Mixed conductive and sensorineural hearing loss, unilateral, left ear, with unrestricted hearing on the contralateral side: Secondary | ICD-10-CM | POA: Diagnosis not present

## 2014-06-10 DIAGNOSIS — H6982 Other specified disorders of Eustachian tube, left ear: Secondary | ICD-10-CM | POA: Diagnosis not present

## 2014-06-10 DIAGNOSIS — J0121 Acute recurrent ethmoidal sinusitis: Secondary | ICD-10-CM | POA: Diagnosis not present

## 2014-06-24 ENCOUNTER — Other Ambulatory Visit: Payer: Self-pay | Admitting: *Deleted

## 2014-06-24 MED ORDER — ALPRAZOLAM 0.5 MG PO TABS: ORAL_TABLET | ORAL | Status: DC

## 2014-06-25 MED ORDER — ALPRAZOLAM 0.5 MG PO TABS: ORAL_TABLET | ORAL | Status: DC

## 2014-06-25 NOTE — Addendum Note (Signed)
Addended by: Gilles Chiquito B on: 06/25/2014 01:52 PM   Modules accepted: Orders

## 2014-06-25 NOTE — Telephone Encounter (Signed)
I sent it in.  Thanks

## 2014-06-25 NOTE — Telephone Encounter (Signed)
Please send this Rx electronically as its mail order.

## 2014-07-01 ENCOUNTER — Encounter: Payer: Self-pay | Admitting: Internal Medicine

## 2014-07-01 ENCOUNTER — Ambulatory Visit (INDEPENDENT_AMBULATORY_CARE_PROVIDER_SITE_OTHER): Payer: Medicare Other | Admitting: Internal Medicine

## 2014-07-01 VITALS — BP 153/76 | HR 66 | Temp 98.6°F | Ht 60.5 in | Wt 186.5 lb

## 2014-07-01 DIAGNOSIS — Z599 Problem related to housing and economic circumstances, unspecified: Secondary | ICD-10-CM

## 2014-07-01 DIAGNOSIS — Z789 Other specified health status: Secondary | ICD-10-CM

## 2014-07-01 DIAGNOSIS — I10 Essential (primary) hypertension: Secondary | ICD-10-CM | POA: Diagnosis not present

## 2014-07-01 DIAGNOSIS — M25561 Pain in right knee: Secondary | ICD-10-CM | POA: Insufficient documentation

## 2014-07-01 DIAGNOSIS — Z598 Other problems related to housing and economic circumstances: Secondary | ICD-10-CM

## 2014-07-01 DIAGNOSIS — R519 Headache, unspecified: Secondary | ICD-10-CM

## 2014-07-01 DIAGNOSIS — R3915 Urgency of urination: Secondary | ICD-10-CM | POA: Insufficient documentation

## 2014-07-01 DIAGNOSIS — E8881 Metabolic syndrome: Secondary | ICD-10-CM

## 2014-07-01 DIAGNOSIS — F419 Anxiety disorder, unspecified: Secondary | ICD-10-CM | POA: Diagnosis not present

## 2014-07-01 DIAGNOSIS — E638 Other specified nutritional deficiencies: Secondary | ICD-10-CM

## 2014-07-01 DIAGNOSIS — G4700 Insomnia, unspecified: Secondary | ICD-10-CM

## 2014-07-01 DIAGNOSIS — R51 Headache: Secondary | ICD-10-CM

## 2014-07-01 DIAGNOSIS — F330 Major depressive disorder, recurrent, mild: Secondary | ICD-10-CM

## 2014-07-01 DIAGNOSIS — G8929 Other chronic pain: Secondary | ICD-10-CM

## 2014-07-01 DIAGNOSIS — M159 Polyosteoarthritis, unspecified: Secondary | ICD-10-CM | POA: Insufficient documentation

## 2014-07-01 DIAGNOSIS — K5790 Diverticulosis of intestine, part unspecified, without perforation or abscess without bleeding: Secondary | ICD-10-CM

## 2014-07-01 DIAGNOSIS — M150 Primary generalized (osteo)arthritis: Secondary | ICD-10-CM

## 2014-07-01 DIAGNOSIS — K219 Gastro-esophageal reflux disease without esophagitis: Secondary | ICD-10-CM

## 2014-07-01 DIAGNOSIS — F339 Major depressive disorder, recurrent, unspecified: Secondary | ICD-10-CM

## 2014-07-01 LAB — GLUCOSE, CAPILLARY: GLUCOSE-CAPILLARY: 100 mg/dL — AB (ref 65–99)

## 2014-07-01 LAB — BASIC METABOLIC PANEL WITH GFR
BUN: 18 mg/dL (ref 6–23)
CO2: 26 mEq/L (ref 19–32)
Calcium: 9.4 mg/dL (ref 8.4–10.5)
Chloride: 103 mEq/L (ref 96–112)
Creat: 0.7 mg/dL (ref 0.50–1.10)
GFR, Est African American: >89 mL/min
Glucose, Bld: 100 mg/dL — ABNORMAL HIGH (ref 70–99)
Potassium: 3.9 mEq/L (ref 3.5–5.3)
Sodium: 139 mEq/L (ref 135–145)

## 2014-07-01 LAB — POCT GLYCOSYLATED HEMOGLOBIN (HGB A1C): Hemoglobin A1C: 5.7

## 2014-07-01 MED ORDER — ALPRAZOLAM 0.5 MG PO TABS: ORAL_TABLET | ORAL | Status: DC

## 2014-07-01 MED ORDER — HYDROCHLOROTHIAZIDE 12.5 MG PO CAPS: 12.5000 mg | ORAL_CAPSULE | Freq: Every day | ORAL | Status: DC

## 2014-07-01 NOTE — Assessment & Plan Note (Signed)
This is one of [name]'s main issues, however, she is resistant to further discussion or assistance.  She has continued passive suicidal ideation, continued difficulty with relationships with her family, continued binge eating behavior.  She has seen a psychiatrist in the past, but did not feel that speaking with that provider was helpful.  She has been on a variety of antidepressants and remains on chronic xanax (which has been weaned in the past without much success).

## 2014-07-01 NOTE — Patient Instructions (Signed)
General Instructions: Please schedule a follow up visit within the next 3 months to check on your blood pressure.   For your medications:   Please bring all of your pill  Bottles with you to each visit.  This will help make sure that we have an up to date list of all the medications you are taking.  Please also bring any over the counter herbal medications you are taking (not including advil, tylenol, etc.)  Please continue taking your medications as prescribed.   Please start taking HCTZ for your blood pressure.  This is a once a day medication.  More information is below.   Thank you!    Treatment Goals:  Goals (1 Years of Data) as of 07/01/14      Lifestyle   . Prevent Falls       Progress Toward Treatment Goals:  Treatment Goal 07/01/2014  Blood pressure unchanged  Prevent falls -    Self Care Goals & Plans:  Self Care Goal 07/01/2014  Manage my medications take my medicines as prescribed; bring my medications to every visit  Monitor my health keep track of my blood pressure  Eat healthy foods eat more vegetables; eat foods that are low in salt; eat baked foods instead of fried foods  Be physically active find an activity I enjoy; take a walk every day  Prevent falls -    No flowsheet data found.   Care Management & Community Referrals:  Referral 07/01/2014  Referrals made for care management support none needed  Referrals made to community resources -     Hydrochlorothiazide, HCTZ capsules or tablets What is this medicine? HYDROCHLOROTHIAZIDE (hye droe klor oh THYE a zide) is a diuretic. It increases the amount of urine passed, which causes the body to lose salt and water. This medicine is used to treat high blood pressure. It is also reduces the swelling and water retention caused by various medical conditions, such as heart, liver, or kidney disease. This medicine may be used for other purposes; ask your health care provider or pharmacist if you have  questions. COMMON BRAND NAME(S): Esidrix, Ezide, HydroDIURIL, Microzide, Oretic, Zide What should I tell my health care provider before I take this medicine? They need to know if you have any of these conditions: -diabetes -gout -immune system problems, like lupus -kidney disease or kidney stones -liver disease -pancreatitis -small amount of urine or difficulty passing urine -an unusual or allergic reaction to hydrochlorothiazide, sulfa drugs, other medicines, foods, dyes, or preservatives -pregnant or trying to get pregnant -breast-feeding How should I use this medicine? Take this medicine by mouth with a glass of water. Follow the directions on the prescription label. Take your medicine at regular intervals. Remember that you will need to pass urine frequently after taking this medicine. Do not take your doses at a time of day that will cause you problems. Do not stop taking your medicine unless your doctor tells you to. Talk to your pediatrician regarding the use of this medicine in children. Special care may be needed. Overdosage: If you think you have taken too much of this medicine contact a poison control center or emergency room at once. NOTE: This medicine is only for you. Do not share this medicine with others. What if I miss a dose? If you miss a dose, take it as soon as you can. If it is almost time for your next dose, take only that dose. Do not take double or extra doses. What may  interact with this medicine? -cholestyramine -colestipol -digoxin -dofetilide -lithium -medicines for blood pressure -medicines for diabetes -medicines that relax muscles for surgery -other diuretics -steroid medicines like prednisone or cortisone This list may not describe all possible interactions. Give your health care provider a list of all the medicines, herbs, non-prescription drugs, or dietary supplements you use. Also tell them if you smoke, drink alcohol, or use illegal drugs. Some  items may interact with your medicine. What should I watch for while using this medicine? Visit your doctor or health care professional for regular checks on your progress. Check your blood pressure as directed. Ask your doctor or health care professional what your blood pressure should be and when you should contact him or her. You may need to be on a special diet while taking this medicine. Ask your doctor. Check with your doctor or health care professional if you get an attack of severe diarrhea, nausea and vomiting, or if you sweat a lot. The loss of too much body fluid can make it dangerous for you to take this medicine. You may get drowsy or dizzy. Do not drive, use machinery, or do anything that needs mental alertness until you know how this medicine affects you. Do not stand or sit up quickly, especially if you are an older patient. This reduces the risk of dizzy or fainting spells. Alcohol may interfere with the effect of this medicine. Avoid alcoholic drinks. This medicine may affect your blood sugar level. If you have diabetes, check with your doctor or health care professional before changing the dose of your diabetic medicine. This medicine can make you more sensitive to the sun. Keep out of the sun. If you cannot avoid being in the sun, wear protective clothing and use sunscreen. Do not use sun lamps or tanning beds/booths. What side effects may I notice from receiving this medicine? Side effects that you should report to your doctor or health care professional as soon as possible: -allergic reactions such as skin rash or itching, hives, swelling of the lips, mouth, tongue, or throat -changes in vision -chest pain -eye pain -fast or irregular heartbeat -feeling faint or lightheaded, falls -gout attack -muscle pain or cramps -pain or difficulty when passing urine -pain, tingling, numbness in the hands or feet -redness, blistering, peeling or loosening of the skin, including inside  the mouth -unusually weak or tired Side effects that usually do not require medical attention (report to your doctor or health care professional if they continue or are bothersome): -change in sex drive or performance -dry mouth -headache -stomach upset This list may not describe all possible side effects. Call your doctor for medical advice about side effects. You may report side effects to FDA at 1-800-FDA-1088. Where should I keep my medicine? Keep out of the reach of children. Store at room temperature between 15 and 30 degrees C (59 and 86 degrees F). Do not freeze. Protect from light and moisture. Keep container closed tightly. Throw away any unused medicine after the expiration date. NOTE: This sheet is a summary. It may not cover all possible information. If you have questions about this medicine, talk to your doctor, pharmacist, or health care provider.  2015, Elsevier/Gold Standard. (2009-09-03 12:57:37)

## 2014-07-01 NOTE — Progress Notes (Signed)
Subjective:    Patient ID: [name], female    DOB: 1950/08/06, 64 y.o.   MRN: [id number]  CC: Routine visit for HTN  HPI  [name] is a 64yo woman with PMH of Anxiety/Depression, Chronic sinusitis, HLD, HTN, Metabolic syndrome who comes in for routine follow up of her chronic health issues.   [name] reports to me that today she has some urinary urgency that started this morning.  She has felt the urge to go, but on attempting urination had very little output.  She is not sure if it is "just nerves" but she has also had some mild suprapubic tenderness.  Denies change in color or smell of the urine, no fever, no chills.    We discussed her nutrition and it continues to be poor.  She has continued financial difficulties and is eating what she can get her hands on.  However, when she does have food in the house, she binge eats.  She has attempted to eat fruit, but also eats candy on occasion.  She has moved into a new apartment where she now must walk her dogs and has successfully lost 10 pounds which I congratulated her on.  She has excessive stress due to her financial difficulties.  Taking B vitamins, calcium, magnesium and zinc.   The same family issues are present, and these cause her stress.  She has osteoarthritis of the back and knees and takes advil, but cannot quantify the exact amount.  She has tried tramadol and vicoden without relief of her pain.  She has a daily morning headache improved with advil and caffeine.    She has a few other complaints including a new skin tag on the chest that she thinks came up in a day and a spot on her left nares which she has been picking at, she thinks it is a zit.    She did not bring in her medications, but she reports taking her lexapro, xanax and prilosec.    Review of Systems  Constitutional: Negative for fever, chills and unexpected weight change.  HENT: Positive for congestion.        Change in taste since surgery on  nose  Respiratory: Positive for shortness of breath (from congestion). Negative for cough, choking and chest tightness.   Cardiovascular: Negative for chest pain and leg swelling.  Genitourinary: Positive for urgency and difficulty urinating (only able to get a small amount out.  ). Negative for dysuria and frequency.  Musculoskeletal: Positive for arthralgias (throbbing pain in joints due to weather changes).  Skin: Negative for rash and wound.  Neurological: Positive for headaches (daily tension).       Objective:   Physical Exam  Constitutional: She is oriented to person, place, and time. She appears well-developed and well-nourished. No distress.  HENT:  Head: Normocephalic and atraumatic.  Mouth/Throat: No oropharyngeal exudate.  Eyes: Conjunctivae are normal. Pupils are equal, round, and reactive to light. No scleral icterus.  Neck: Normal range of motion. Neck supple. No thyromegaly present.  Cardiovascular: Normal rate, regular rhythm and normal heart sounds.   Pulmonary/Chest: Effort normal and breath sounds normal. No respiratory distress. She has no wheezes.  Abdominal: Soft. Bowel sounds are normal. She exhibits no distension. There is tenderness (minimal suprapubic).  Musculoskeletal: She exhibits no edema or tenderness.  Lymphadenopathy:    She has no cervical adenopathy.  Neurological: She is alert and oriented to person, place, and time.  Skin: Skin is warm and  dry. No rash noted. No erythema.  Psychiatric: Her behavior is normal.  Depressed and anxious affect, unchanged    BMET, A1C, UA, UC today      Assessment & Plan:  RTC in 3 months for BP check.

## 2014-07-01 NOTE — Assessment & Plan Note (Signed)
She is taking vitamins and continues to eat cheese and eggs.  Continue to monitor for any signs of nutritional deficiency.

## 2014-07-01 NOTE — Assessment & Plan Note (Signed)
No signs or symptoms of bleeding or of diverticulitis.  Patient is unwilling to undergo further screening for colon cancer.

## 2014-07-01 NOTE — Assessment & Plan Note (Signed)
BP Readings from Last 3 Encounters:  07/01/14 153/76  03/11/14 112/70  02/25/14 164/81    Lab Results  Component Value Date   NA 142 09/24/2013   K 3.7 09/24/2013   CREATININE 0.63 09/24/2013    Assessment: Blood pressure control: mildly elevated Progress toward BP goal:  unchanged Comments: She seems more willing to start a medication today than she has been in the past.   Plan: Medications:  start hctz 12.5mg  Educational resources provided: brochure Self management tools provided: home blood pressure logbook Other plans: Check BMET today.

## 2014-07-01 NOTE — Assessment & Plan Note (Signed)
Continues to be an issue on occasion, she takes xanax for this issue as well as for her anxiety.  No change.

## 2014-07-01 NOTE — Assessment & Plan Note (Signed)
I believe her headaches are more consistent with medication withdrawal headaches, considering she is taking chronic daily advil.  We talked about this issue and she is willing to try cutting back on her advil to 400mg  in the morning to see if this helps eventually.  I warned her the headaches may initially be worse but should improve.

## 2014-07-01 NOTE — Assessment & Plan Note (Signed)
LDL 75 at last check, A1C 5.7 today.  BP uncontrolled, on recheck was higher. Start HCTZ today.

## 2014-07-01 NOTE — Assessment & Plan Note (Signed)
UA and UC today.

## 2014-07-01 NOTE — Assessment & Plan Note (Signed)
Taking prilosec with good results.  Have advised her to cut down on advil use.

## 2014-07-01 NOTE — Assessment & Plan Note (Signed)
Continues to be a difficult issue for [name] and one of the main factors/barriers to her seeking out medical care and psychiatric care.  Attempted to get assistance for her on multiple occasions.  She is now in a less expensive living arrangement, which is helping somewhat.

## 2014-07-02 LAB — URINALYSIS, ROUTINE W REFLEX MICROSCOPIC
Bilirubin Urine: NEGATIVE
GLUCOSE, UA: NEGATIVE mg/dL
Hgb urine dipstick: NEGATIVE
Ketones, ur: NEGATIVE mg/dL
Nitrite: NEGATIVE
PH: 6 (ref 5.0–8.0)
Protein, ur: NEGATIVE mg/dL
SPECIFIC GRAVITY, URINE: 1.009 (ref 1.005–1.030)
UROBILINOGEN UA: 0.2 mg/dL (ref 0.0–1.0)

## 2014-07-02 LAB — URINALYSIS, MICROSCOPIC ONLY: Casts: NONE SEEN

## 2014-07-03 LAB — URINE CULTURE

## 2014-07-13 ENCOUNTER — Other Ambulatory Visit: Payer: Self-pay | Admitting: Internal Medicine

## 2014-07-13 DIAGNOSIS — F330 Major depressive disorder, recurrent, mild: Secondary | ICD-10-CM

## 2014-07-13 MED ORDER — ALPRAZOLAM 0.5 MG PO TABS: ORAL_TABLET | ORAL | Status: DC

## 2014-07-20 ENCOUNTER — Telehealth: Payer: Self-pay | Admitting: *Deleted

## 2014-07-20 NOTE — Telephone Encounter (Signed)
Pt called with c/o splitting headache when she takes her Blood pressure meds.  She does not want to take it any more.  She took it this AM and now she does not want to get up, she can't stand to open her eyes. HCTZ was started on 6/6 Please advise  Pt # [phone]

## 2014-07-20 NOTE — Telephone Encounter (Signed)
You can tell her she can stop taking it and see if headaches resolve.  She has chronic daily headaches and reported no symptoms like this when I saw her earlier this month.   Thanks  EBM

## 2014-07-20 NOTE — Telephone Encounter (Signed)
Pt informed and will let us know how she does after stopping the HCTZ.

## 2014-07-24 ENCOUNTER — Other Ambulatory Visit (INDEPENDENT_AMBULATORY_CARE_PROVIDER_SITE_OTHER): Payer: Self-pay | Admitting: Otolaryngology

## 2014-07-24 DIAGNOSIS — J329 Chronic sinusitis, unspecified: Secondary | ICD-10-CM

## 2014-07-28 ENCOUNTER — Telehealth: Payer: Self-pay | Admitting: *Deleted

## 2014-07-28 NOTE — Telephone Encounter (Signed)
Pt states one hour after starting HCTZ she got an awful headache.  She could hear her heart pounding in her ears.  She took med for 2 weeks and then d/c the med.    This was reported on 6/27 She still has headaches, not as severe and she can still hear her heart the pounding in her ears.  Pt advise to come in for a f/u office visit to recheck BP since she is not off the HCTZ. Appointment scheduled for 7/13 at 9:15 She is agreeable with this and will call back if any other problems.

## 2014-07-28 NOTE — Telephone Encounter (Signed)
Agree with plan 

## 2014-07-29 ENCOUNTER — Ambulatory Visit
Admission: RE | Admit: 2014-07-29 | Discharge: 2014-07-29 | Disposition: A | Discharge status: Home / Self Care | Payer: Medicare Other | Source: Ambulatory Visit | Attending: Otolaryngology | Admitting: Otolaryngology

## 2014-07-29 DIAGNOSIS — J329 Chronic sinusitis, unspecified: Secondary | ICD-10-CM

## 2014-08-05 ENCOUNTER — Encounter: Payer: Self-pay | Admitting: Internal Medicine

## 2014-08-05 ENCOUNTER — Ambulatory Visit (INDEPENDENT_AMBULATORY_CARE_PROVIDER_SITE_OTHER): Payer: Medicare Other | Admitting: Internal Medicine

## 2014-08-05 VITALS — BP 149/77 | HR 78 | Temp 98.2°F | Ht 60.5 in | Wt 185.6 lb

## 2014-08-05 DIAGNOSIS — R519 Headache, unspecified: Secondary | ICD-10-CM

## 2014-08-05 DIAGNOSIS — I10 Essential (primary) hypertension: Secondary | ICD-10-CM | POA: Diagnosis not present

## 2014-08-05 DIAGNOSIS — R51 Headache: Secondary | ICD-10-CM

## 2014-08-05 DIAGNOSIS — J329 Chronic sinusitis, unspecified: Secondary | ICD-10-CM

## 2014-08-05 DIAGNOSIS — F411 Generalized anxiety disorder: Secondary | ICD-10-CM | POA: Diagnosis not present

## 2014-08-05 DIAGNOSIS — Z598 Other problems related to housing and economic circumstances: Secondary | ICD-10-CM | POA: Diagnosis not present

## 2014-08-05 DIAGNOSIS — Z599 Problem related to housing and economic circumstances, unspecified: Secondary | ICD-10-CM

## 2014-08-05 DIAGNOSIS — Z0000 Encounter for general adult medical examination without abnormal findings: Secondary | ICD-10-CM

## 2014-08-05 DIAGNOSIS — J321 Chronic frontal sinusitis: Secondary | ICD-10-CM

## 2014-08-05 MED ORDER — AMLODIPINE BESYLATE 2.5 MG PO TABS: 2.5000 mg | ORAL_TABLET | Freq: Every day | ORAL | Status: DC

## 2014-08-05 NOTE — Assessment & Plan Note (Signed)
She was not able to tolerate HCTZ.  Will plan to start low dose amlodipine and uptitrate as needed.  Amlodipine 2.5mg  daily.

## 2014-08-05 NOTE — Assessment & Plan Note (Addendum)
Recently seen by ENT and got a new CT of her max/face which showed continued sinusitis.  She has had increased anxiety due to looking on the internet and finding some rare side effects of the disorder and of the surgery.  I advised her to call the ENT office to speak to one of the providers for more clarity prior to becoming too anxious about any possible procedures.   She apparently has an upcoming appointment in 1-2 weeks.   Today, she has no fever, chills, worsening drainage, ear pain.

## 2014-08-05 NOTE — Patient Instructions (Signed)
General Instructions: Please schedule a follow up visit within the next 6 months.   For your medications:   Please bring all of your pill  Bottles with you to each visit.  This will help make sure that we have an up to date list of all the medications you are taking.  Please also bring any over the counter herbal medications you are taking (not including advil, tylenol, etc.)  Please start taking amlodipine as noted below.  Please call if you have any side effects.   Thank you!   Treatment Goals:  Goals (1 Years of Data) as of 08/05/14      Lifestyle   . Prevent Falls       Progress Toward Treatment Goals:  Treatment Goal 08/05/2014  Blood pressure unchanged  Prevent falls -    Self Care Goals & Plans:  Self Care Goal 08/05/2014  Manage my medications take my medicines as prescribed; bring my medications to every visit; refill my medications on time  Monitor my health keep track of my blood pressure  Eat healthy foods eat more vegetables; eat baked foods instead of fried foods; eat foods that are low in salt  Be physically active find an activity I enjoy; take a walk every day  Prevent falls -    No flowsheet data found.   Care Management & Community Referrals:  Referral 07/01/2014  Referrals made for care management support none needed  Referrals made to community resources -    Amlodipine tablets What is this medicine? AMLODIPINE (am LOE di peen) is a calcium-channel blocker. It affects the amount of calcium found in your heart and muscle cells. This relaxes your blood vessels, which can reduce the amount of work the heart has to do. This medicine is used to lower high blood pressure. It is also used to prevent chest pain. This medicine may be used for other purposes; ask your health care provider or pharmacist if you have questions. COMMON BRAND NAME(S): Norvasc What should I tell my health care provider before I take this medicine? They need to know if you have any  of these conditions: -heart problems like heart failure or aortic stenosis -liver disease -an unusual or allergic reaction to amlodipine, other medicines, foods, dyes, or preservatives -pregnant or trying to get pregnant -breast-feeding How should I use this medicine? Take this medicine by mouth with a glass of water. Follow the directions on the prescription label. Take your medicine at regular intervals. Do not take more medicine than directed. Talk to your pediatrician regarding the use of this medicine in children. Special care may be needed. This medicine has been used in children as young as 6. Persons over 27 years old may have a stronger reaction to this medicine and need smaller doses. Overdosage: If you think you have taken too much of this medicine contact a poison control center or emergency room at once. NOTE: This medicine is only for you. Do not share this medicine with others. What if I miss a dose? If you miss a dose, take it as soon as you can. If it is almost time for your next dose, take only that dose. Do not take double or extra doses. What may interact with this medicine? -herbal or dietary supplements -local or general anesthetics -medicines for high blood pressure -medicines for prostate problems -rifampin This list may not describe all possible interactions. Give your health care provider a list of all the medicines, herbs, non-prescription drugs, or dietary  supplements you use. Also tell them if you smoke, drink alcohol, or use illegal drugs. Some items may interact with your medicine. What should I watch for while using this medicine? Visit your doctor or health care professional for regular check ups. Check your blood pressure and pulse rate regularly. Ask your health care professional what your blood pressure and pulse rate should be, and when you should contact him or her. This medicine may make you feel confused, dizzy or lightheaded. Do not drive, use  machinery, or do anything that needs mental alertness until you know how this medicine affects you. To reduce the risk of dizzy or fainting spells, do not sit or stand up quickly, especially if you are an older patient. Avoid alcoholic drinks; they can make you more dizzy. Do not suddenly stop taking amlodipine. Ask your doctor or health care professional how you can gradually reduce the dose. What side effects may I notice from receiving this medicine? Side effects that you should report to your doctor or health care professional as soon as possible: -allergic reactions like skin rash, itching or hives, swelling of the face, lips, or tongue -breathing problems -changes in vision or hearing -chest pain -fast, irregular heartbeat -swelling of legs or ankles Side effects that usually do not require medical attention (report to your doctor or health care professional if they continue or are bothersome): -dry mouth -facial flushing -nausea, vomiting -stomach gas, pain -tired, weak -trouble sleeping This list may not describe all possible side effects. Call your doctor for medical advice about side effects. You may report side effects to FDA at 1-800-FDA-1088. Where should I keep my medicine? Keep out of the reach of children. Store at room temperature between 59 and 86 degrees F (15 and 30 degrees C). Protect from light. Keep container tightly closed. Throw away any unused medicine after the expiration date. NOTE: This sheet is a summary. It may not cover all possible information. If you have questions about this medicine, talk to your doctor, pharmacist, or health care provider.  2015, Elsevier/Gold Standard. (2011-12-08 11:40:58)

## 2014-08-05 NOTE — Assessment & Plan Note (Signed)
Refusing all screening.

## 2014-08-05 NOTE — Assessment & Plan Note (Signed)
She does have significant financial stress.  She has insurance, which does allow her to get medications and medical issues resolved.  I have attempted to get her assistance through SW here in the clinic, but she has refused on many occasions.

## 2014-08-05 NOTE — Assessment & Plan Note (Signed)
Again continue to advise decreased Advil use.

## 2014-08-05 NOTE — Progress Notes (Signed)
   Subjective:    Patient ID: [name], female    DOB: 1950/06/02, 64 y.o.   MRN: [id number]  CC: 1 month follow up for HTN  HPI  Ms. Boldin is a 64yo woman with PMH of Depression/anxiety, chronic sinusitis, chronic daily headache, metabolic syndrome, HTN who presents for follow up of BP.  At last visit, she was started on HCTZ, but this worsened her chronic headaches.  She has stopped the medication.    Seen by ENT last week.  Had a CT max/face which showed persistent chronic sinusitis.  This was very concerning to her based on her diagnosis.  She looked up her Dx on the Internet and this has caused significant anxiety and stress.  She attempted to reach out to her daughter, but got no response.  She has to move again due to rent issues.  They are cutting down trees in her neighborhood which is causing her stress.  She reports needing dental work.  Is on waiting list for new housing, however, her dog is too heavy to stay there.    Every visit she perseverates on her social situation but again denies any assistance from our SW.    Discussed her headaches which are likely medication overuse.  She has not attempted to decrease her ibuprofen as we discussed at last visit.  She has been trying to drink more water, but does not like that it increases her need to go to the bathroom.  She has intermittently complained of urinary incontinence, which sounds like urge incontinence, but she has refused therapy.   For her BP, she has headaches, but does not report chest pain or any other symptoms.  She reports not caring if she were to have a stroke and again reports passive suicidal thoughts, where the only thing she is living for is to care for her elderly dogs.    She is not smoking.  Not able to assess MJ use at this visit.    Review of Systems  HENT: Positive for congestion, postnasal drip and sinus pressure. Negative for dental problem.   Eyes: Negative for photophobia and visual  disturbance.  Respiratory: Negative for cough and shortness of breath.   Cardiovascular: Negative for chest pain.  Neurological: Positive for headaches. Negative for syncope.       Objective:   Physical Exam  Constitutional: She is oriented to person, place, and time. She appears well-developed and well-nourished. No distress.  HENT:  Head: Normocephalic and atraumatic.  Eyes: Conjunctivae are normal. No scleral icterus.  Neurological: She is alert and oriented to person, place, and time.  Psychiatric:  She was in a very agitated mood this morning, expressing discontentment about her worries as noted in HPI, the fact that her ENT surgeon took vacation, which has affected her, as well as the form she was asked to fill out regarding physician hand washing.          Assessment & Plan:  RTC in 6 months for routine follow up.   > 50% of this visit was spent in counseling and discussion about her anxiety and depression.  I spent ~ 20 minutes with the patient.  Plan below.

## 2014-08-05 NOTE — Assessment & Plan Note (Signed)
Continues to be an issue as noted above.  She has anxiety related to her social situation which is a very difficult situation.  Resources available through our clinic have been denied.  I continued to stress to her that psychotherapy would be helpful for her.

## 2014-08-21 ENCOUNTER — Other Ambulatory Visit: Payer: Self-pay | Admitting: Otolaryngology

## 2014-09-10 ENCOUNTER — Encounter (HOSPITAL_BASED_OUTPATIENT_CLINIC_OR_DEPARTMENT_OTHER): Payer: Self-pay | Admitting: *Deleted

## 2014-09-11 ENCOUNTER — Other Ambulatory Visit: Payer: Self-pay

## 2014-09-11 ENCOUNTER — Encounter (HOSPITAL_BASED_OUTPATIENT_CLINIC_OR_DEPARTMENT_OTHER)
Admission: RE | Admit: 2014-09-11 | Discharge: 2014-09-11 | Disposition: A | Discharge status: Home / Self Care | Payer: Medicare Other | Source: Ambulatory Visit | Attending: Otolaryngology | Admitting: Otolaryngology

## 2014-09-11 DIAGNOSIS — J338 Other polyp of sinus: Secondary | ICD-10-CM | POA: Diagnosis not present

## 2014-09-11 DIAGNOSIS — J322 Chronic ethmoidal sinusitis: Secondary | ICD-10-CM | POA: Insufficient documentation

## 2014-09-11 DIAGNOSIS — Z01818 Encounter for other preprocedural examination: Secondary | ICD-10-CM | POA: Insufficient documentation

## 2014-09-11 DIAGNOSIS — J321 Chronic frontal sinusitis: Secondary | ICD-10-CM | POA: Insufficient documentation

## 2014-09-11 DIAGNOSIS — J323 Chronic sphenoidal sinusitis: Secondary | ICD-10-CM | POA: Insufficient documentation

## 2014-09-11 DIAGNOSIS — I10 Essential (primary) hypertension: Secondary | ICD-10-CM | POA: Diagnosis not present

## 2014-09-11 DIAGNOSIS — K219 Gastro-esophageal reflux disease without esophagitis: Secondary | ICD-10-CM | POA: Diagnosis not present

## 2014-09-11 LAB — BASIC METABOLIC PANEL
ANION GAP: 12 (ref 5–15)
BUN: 12 mg/dL (ref 6–20)
CHLORIDE: 103 mmol/L (ref 101–111)
CO2: 24 mmol/L (ref 22–32)
Calcium: 9.3 mg/dL (ref 8.9–10.3)
Creatinine, Ser: 0.76 mg/dL (ref 0.44–1.00)
GFR calc Af Amer: >60 mL/min (ref >60)
GFR calc non Af Amer: >60 mL/min (ref >60)
GLUCOSE: 103 mg/dL — AB (ref 65–99)
POTASSIUM: 3.9 mmol/L (ref 3.5–5.1)
Sodium: 139 mmol/L (ref 135–145)

## 2014-09-14 DIAGNOSIS — J323 Chronic sphenoidal sinusitis: Secondary | ICD-10-CM | POA: Diagnosis not present

## 2014-09-15 ENCOUNTER — Encounter (HOSPITAL_BASED_OUTPATIENT_CLINIC_OR_DEPARTMENT_OTHER): Payer: Self-pay | Admitting: *Deleted

## 2014-09-15 ENCOUNTER — Encounter (HOSPITAL_BASED_OUTPATIENT_CLINIC_OR_DEPARTMENT_OTHER)
Admission: RE | Disposition: A | Discharge status: Home / Self Care | Payer: Self-pay | Source: Ambulatory Visit | Attending: Otolaryngology

## 2014-09-15 ENCOUNTER — Ambulatory Visit (HOSPITAL_BASED_OUTPATIENT_CLINIC_OR_DEPARTMENT_OTHER)
Admission: RE | Admit: 2014-09-15 | Discharge: 2014-09-15 | Disposition: A | Discharge status: Home / Self Care | Payer: Medicare Other | Source: Ambulatory Visit | Attending: Otolaryngology | Admitting: Otolaryngology

## 2014-09-15 ENCOUNTER — Ambulatory Visit (HOSPITAL_BASED_OUTPATIENT_CLINIC_OR_DEPARTMENT_OTHER): Payer: Medicare Other | Admitting: Anesthesiology

## 2014-09-15 DIAGNOSIS — J322 Chronic ethmoidal sinusitis: Secondary | ICD-10-CM | POA: Diagnosis not present

## 2014-09-15 DIAGNOSIS — J338 Other polyp of sinus: Secondary | ICD-10-CM | POA: Diagnosis not present

## 2014-09-15 DIAGNOSIS — J321 Chronic frontal sinusitis: Secondary | ICD-10-CM | POA: Insufficient documentation

## 2014-09-15 DIAGNOSIS — K219 Gastro-esophageal reflux disease without esophagitis: Secondary | ICD-10-CM | POA: Insufficient documentation

## 2014-09-15 DIAGNOSIS — J323 Chronic sphenoidal sinusitis: Secondary | ICD-10-CM | POA: Diagnosis not present

## 2014-09-15 DIAGNOSIS — I10 Essential (primary) hypertension: Secondary | ICD-10-CM | POA: Insufficient documentation

## 2014-09-15 HISTORY — PX: FRONTAL SINUS EXPLORATION: SHX6591

## 2014-09-15 HISTORY — DX: Other specified postprocedural states: Z98.890

## 2014-09-15 HISTORY — PX: SPHENOIDECTOMY: SHX2421

## 2014-09-15 HISTORY — DX: Nausea with vomiting, unspecified: R11.2

## 2014-09-15 HISTORY — PX: SINUS ENDO WITH FUSION: SHX5329

## 2014-09-15 LAB — POCT HEMOGLOBIN-HEMACUE: Hemoglobin: 12 g/dL (ref 12.0–15.0)

## 2014-09-15 SURGERY — SURGERY, PARANASAL SINUS, ENDOSCOPIC, WITH NASAL SEPTOPLASTY, TURBINOPLASTY, AND MAXILLARY SINUSOTOMY
Anesthesia: General | Site: Nose | Laterality: Right

## 2014-09-15 MED ORDER — SCOPOLAMINE 1 MG/3DAYS TD PT72
1.0000 | MEDICATED_PATCH | Freq: Once | TRANSDERMAL | Status: DC | PRN
  Administered 2014-09-15: 1.5 mg via TRANSDERMAL

## 2014-09-15 MED ORDER — OXYCODONE HCL 5 MG/5ML PO SOLN: 5.0000 mg | Freq: Once | ORAL | Status: DC | PRN

## 2014-09-15 MED ORDER — LIDOCAINE HCL (CARDIAC) 20 MG/ML IV SOLN
INTRAVENOUS | Status: DC | PRN
  Administered 2014-09-15: 50 mg via INTRAVENOUS

## 2014-09-15 MED ORDER — LIDOCAINE-EPINEPHRINE 1 %-1:100000 IJ SOLN
INTRAMUSCULAR | Status: AC
  Filled 2014-09-15: qty 1

## 2014-09-15 MED ORDER — HYDROMORPHONE HCL 1 MG/ML IJ SOLN
INTRAMUSCULAR | Status: AC
  Filled 2014-09-15: qty 1

## 2014-09-15 MED ORDER — BACITRACIN ZINC 500 UNIT/GM EX OINT
TOPICAL_OINTMENT | CUTANEOUS | Status: AC
  Filled 2014-09-15: qty 28.35

## 2014-09-15 MED ORDER — CLINDAMYCIN HCL 300 MG PO CAPS: 300.0000 mg | ORAL_CAPSULE | Freq: Four times a day (QID) | ORAL | Status: DC

## 2014-09-15 MED ORDER — PROPOFOL 10 MG/ML IV BOLUS
INTRAVENOUS | Status: DC | PRN
  Administered 2014-09-15: 200 mg via INTRAVENOUS

## 2014-09-15 MED ORDER — SODIUM CHLORIDE 0.9 % IR SOLN
Status: DC | PRN
  Administered 2014-09-15: 1

## 2014-09-15 MED ORDER — DEXAMETHASONE SODIUM PHOSPHATE 4 MG/ML IJ SOLN
INTRAMUSCULAR | Status: DC | PRN
  Administered 2014-09-15: 10 mg via INTRAVENOUS

## 2014-09-15 MED ORDER — SUCCINYLCHOLINE CHLORIDE 20 MG/ML IJ SOLN
INTRAMUSCULAR | Status: DC | PRN
  Administered 2014-09-15: 100 mg via INTRAVENOUS

## 2014-09-15 MED ORDER — MUPIROCIN 2 % EX OINT
TOPICAL_OINTMENT | CUTANEOUS | Status: DC | PRN
  Administered 2014-09-15: 1 via TOPICAL

## 2014-09-15 MED ORDER — MEPERIDINE HCL 25 MG/ML IJ SOLN: 6.2500 mg | INTRAMUSCULAR | Status: DC | PRN

## 2014-09-15 MED ORDER — MIDAZOLAM HCL 5 MG/5ML IJ SOLN
INTRAMUSCULAR | Status: DC | PRN
  Administered 2014-09-15: 2 mg via INTRAVENOUS

## 2014-09-15 MED ORDER — FENTANYL CITRATE (PF) 100 MCG/2ML IJ SOLN
INTRAMUSCULAR | Status: AC
  Filled 2014-09-15: qty 4

## 2014-09-15 MED ORDER — EPHEDRINE SULFATE 50 MG/ML IJ SOLN
INTRAMUSCULAR | Status: DC | PRN
  Administered 2014-09-15: 15 mg via INTRAVENOUS
  Administered 2014-09-15: 10 mg via INTRAVENOUS

## 2014-09-15 MED ORDER — ONDANSETRON HCL 4 MG/2ML IJ SOLN
INTRAMUSCULAR | Status: DC | PRN
  Administered 2014-09-15: 4 mg via INTRAVENOUS

## 2014-09-15 MED ORDER — GLYCOPYRROLATE 0.2 MG/ML IJ SOLN
0.2000 mg | Freq: Once | INTRAMUSCULAR | Status: DC | PRN
Start: 2014-09-15 — End: 2014-09-15

## 2014-09-15 MED ORDER — OXYCODONE HCL 5 MG PO TABS: 5.0000 mg | ORAL_TABLET | Freq: Once | ORAL | Status: DC | PRN

## 2014-09-15 MED ORDER — LACTATED RINGERS IV SOLN
INTRAVENOUS | Status: DC
  Administered 2014-09-15 (×2): via INTRAVENOUS

## 2014-09-15 MED ORDER — FENTANYL CITRATE (PF) 100 MCG/2ML IJ SOLN
INTRAMUSCULAR | Status: DC | PRN
  Administered 2014-09-15: 50 ug via INTRAVENOUS

## 2014-09-15 MED ORDER — MUPIROCIN 2 % EX OINT
TOPICAL_OINTMENT | CUTANEOUS | Status: AC
  Filled 2014-09-15: qty 22

## 2014-09-15 MED ORDER — MIDAZOLAM HCL 2 MG/2ML IJ SOLN
INTRAMUSCULAR | Status: AC
  Filled 2014-09-15: qty 2

## 2014-09-15 MED ORDER — BACITRACIN ZINC 500 UNIT/GM EX OINT
TOPICAL_OINTMENT | CUTANEOUS | Status: AC
  Filled 2014-09-15: qty 0.9

## 2014-09-15 MED ORDER — OXYMETAZOLINE HCL 0.05 % NA SOLN
NASAL | Status: DC | PRN
  Administered 2014-09-15: 1 via TOPICAL

## 2014-09-15 MED ORDER — HYDROMORPHONE HCL 1 MG/ML IJ SOLN
0.2500 mg | INTRAMUSCULAR | Status: DC | PRN
  Administered 2014-09-15: 0.5 mg via INTRAVENOUS

## 2014-09-15 MED ORDER — SCOPOLAMINE 1 MG/3DAYS TD PT72
MEDICATED_PATCH | TRANSDERMAL | Status: AC
  Filled 2014-09-15: qty 1

## 2014-09-15 MED ORDER — OXYCODONE-ACETAMINOPHEN 5-325 MG PO TABS: 1.0000 | ORAL_TABLET | ORAL | Status: DC | PRN

## 2014-09-15 SURGICAL SUPPLY — 60 items
ATTRACTOMAT 16X20 MAGNETIC DRP (DRAPES) IMPLANT
BLADE RAD40 ROTATE 4M 4 5PK (BLADE) IMPLANT
BLADE RAD40 ROTATE 4M 4MM 5PK (BLADE)
BLADE RAD60 ROTATE M4 4 5PK (BLADE) IMPLANT
BLADE RAD60 ROTATE M4 4MM 5PK (BLADE)
BLADE ROTATE RAD 12 4 M4 (BLADE) IMPLANT
BLADE ROTATE RAD 12 4MM M4 (BLADE)
BLADE ROTATE RAD 40 4 M4 (BLADE) IMPLANT
BLADE ROTATE RAD 40 4MM M4 (BLADE)
BLADE ROTATE TRICUT 4MX13CM M4 (BLADE) ×1
BLADE ROTATE TRICUT 4X13 M4 (BLADE) ×3 IMPLANT
BLADE TRICUT ROTATE M4 4 5PK (BLADE) IMPLANT
BLADE TRICUT ROTATE M4 4MM 5PK (BLADE)
BUR HS RAD FRONTAL 3 (BURR) IMPLANT
BUR HS RAD FRONTAL 3MM (BURR)
CANISTER SUC SOCK COL 7IN (MISCELLANEOUS) ×8 IMPLANT
CANISTER SUCT 1200ML W/VALVE (MISCELLANEOUS) ×4 IMPLANT
CATH SINUS IRRIGATION 2.0 (CATHETERS) IMPLANT
COAGULATOR SUCT 6 FR SWTCH (ELECTROSURGICAL)
COAGULATOR SUCT 8FR VV (MISCELLANEOUS) IMPLANT
COAGULATOR SUCT SWTCH 10FR 6 (ELECTROSURGICAL) IMPLANT
DECANTER SPIKE VIAL GLASS SM (MISCELLANEOUS) ×2 IMPLANT
DEVICE INFLATION 20/61 (MISCELLANEOUS) IMPLANT
DRSG NASAL KENNEDY LMNT 8CM (GAUZE/BANDAGES/DRESSINGS) IMPLANT
DRSG NASOPORE 8CM (GAUZE/BANDAGES/DRESSINGS) IMPLANT
ELECT REM PT RETURN 9FT ADLT (ELECTROSURGICAL) ×4
ELECTRODE REM PT RTRN 9FT ADLT (ELECTROSURGICAL) IMPLANT
GLOVE BIO SURGEON STRL SZ7.5 (GLOVE) ×4 IMPLANT
GLOVE SURG SS PI 7.0 STRL IVOR (GLOVE) ×2 IMPLANT
GOWN STRL REUS W/ TWL LRG LVL3 (GOWN DISPOSABLE) ×4 IMPLANT
GOWN STRL REUS W/TWL LRG LVL3 (GOWN DISPOSABLE) ×8
HEMOSTAT SURGICEL 2X14 (HEMOSTASIS) IMPLANT
IV NS 1000ML (IV SOLUTION)
IV NS 1000ML BAXH (IV SOLUTION) IMPLANT
IV NS 500ML (IV SOLUTION) ×8
IV NS 500ML BAXH (IV SOLUTION) ×4 IMPLANT
NDL PRECISIONGLIDE 27X1.5 (NEEDLE) ×2 IMPLANT
NDL SPNL 25GX3.5 QUINCKE BL (NEEDLE) IMPLANT
NEEDLE PRECISIONGLIDE 27X1.5 (NEEDLE) ×4 IMPLANT
NEEDLE SPNL 25GX3.5 QUINCKE BL (NEEDLE) IMPLANT
NS IRRIG 1000ML POUR BTL (IV SOLUTION) IMPLANT
PACK BASIN DAY SURGERY FS (CUSTOM PROCEDURE TRAY) ×4 IMPLANT
PACK ENT DAY SURGERY (CUSTOM PROCEDURE TRAY) ×4 IMPLANT
PAD ENT ADHESIVE 25PK (MISCELLANEOUS) ×2 IMPLANT
SLEEVE SCD COMPRESS KNEE MED (MISCELLANEOUS) ×2 IMPLANT
SOLUTION BUTLER CLEAR DIP (MISCELLANEOUS) ×6 IMPLANT
SPLINT NASAL AIRWAY SILICONE (MISCELLANEOUS) IMPLANT
SPONGE GAUZE 2X2 8PLY STER LF (GAUZE/BANDAGES/DRESSINGS) ×1
SPONGE GAUZE 2X2 8PLY STRL LF (GAUZE/BANDAGES/DRESSINGS) ×3 IMPLANT
SPONGE NEURO XRAY DETECT 1X3 (DISPOSABLE) ×4 IMPLANT
SUT ETHILON 3 0 PS 1 (SUTURE) IMPLANT
SUT PLAIN 4 0 ~~LOC~~ 1 (SUTURE) IMPLANT
TOWEL OR 17X24 6PK STRL BLUE (TOWEL DISPOSABLE) ×4 IMPLANT
TRACKER ENT INSTRUMENT (MISCELLANEOUS) ×4 IMPLANT
TRACKER ENT PATIENT (MISCELLANEOUS) ×4 IMPLANT
TUBE CONNECTING 20'X1/4 (TUBING) ×1
TUBE CONNECTING 20X1/4 (TUBING) ×3 IMPLANT
TUBE SALEM SUMP 16 FR W/ARV (TUBING) IMPLANT
TUBING STRAIGHTSHOT EPS 5PK (TUBING) ×4 IMPLANT
YANKAUER SUCT BULB TIP NO VENT (SUCTIONS) ×4 IMPLANT

## 2014-09-15 NOTE — Discharge Instructions (Addendum)

## 2014-09-15 NOTE — Transfer of Care (Signed)
Immediate Anesthesia Transfer of Care Note  Patient: Gailene J Dasch  Procedure(s) Performed: Procedure(s): LEFT ENDOSCOPIC TOTAL ETHMOIDECTOMY WITH FUSION IMAGE GUIDANCE NAVIGATION (Left) LEFT ENDOSCOPIC FRONTAL RECESS  EXPLORATION WITH FUSION IMAGE GUIDANCE NAVIGATION (Left) RIGHT ENDOSCOPIC SPHENOIDECTOMY WITH FUSION IMAGE GUIDANCE NAVIGATION (Right)  Patient Location: PACU  Anesthesia Type:General  Level of Consciousness: awake, alert  and oriented  Airway & Oxygen Therapy: Patient Spontanous Breathing and Patient connected to face mask oxygen  Post-op Assessment: Report given to RN and Post -op Vital signs reviewed and stable  Post vital signs: Reviewed and stable  Last Vitals:  Filed Vitals:   09/15/14 0624  BP: 124/58  Pulse: 80  Temp: 36.6 C  Resp: 20    Complications: No apparent anesthesia complications

## 2014-09-15 NOTE — Op Note (Signed)
DATE OF PROCEDURE:  09/15/2014                              OPERATIVE REPORT  SURGEON:  Leta Baptist, MD  PREOPERATIVE DIAGNOSES: 1. Chronic right sphenoid sinusitis 2. Chronic left frontal sinusitis 3. Chronic left ethmoid sinusitis  POSTOPERATIVE DIAGNOSES: 1. Chronic right sphenoid sinusitis 2. Chronic left frontal sinusitis 3. Chronic left ethmoid sinusitis  PROCEDURE PERFORMED:  1. Endoscopic left frontal sinusotomy 2. Endoscopic left total ethmoidectomy 3. Endoscopic right sphenoidectomy with polyp removal 4. FUSION stereotactic image guidance  ANESTHESIA:  General endotracheal tube anesthesia.  COMPLICATIONS:  None.  ESTIMATED BLOOD LOSS:  Less than 20 mL  INDICATION FOR PROCEDURE:  [name] is a 64 y.o. female with a history of bilateral chronic pansinusitis and polyposis. She previously underwent bilateral endoscopic sinus surgery one year ago. Over the past few months, she has been experiencing more recurrent sinusitis. She was noted to have purulent drainage from her left middle meatus and the right sphenoid sinus. She was treated with more than 4 courses of antibiotics. She continued to be symptomatic. On her CT scan, she was noted to have opacification of her right sphenoid sinus, left ethmoid and left frontal sinuses. Based on the above findings, the decision was made for the patient to undergo the above stated revision surgery. The risks benefits, alternatives, and details of the procedures were dated discussed with the patient. Questions were invited and answered. Informed consent was obtained.  DESCRIPTION:  The patient was taken to the operating room and placed supine on the operating table.  General endotracheal tube anesthesia was administered by the anesthesiologist.  The patient was positioned and prepped and draped in a standard fashion for sinonasal surgery. Pledgets soaked with Afrin were placed in both nasal cavities for vasoconstriction. The FUSION  stereotactic image guidance marker was placed. The image guidance system was functional throughout the case.  The pledgets were removed. Using a 0 endoscope, both nasal cavities were examined. Purulent drainage was noted from the right sphenoid sinus and the left middle meatus. Photodocumentation of the findings were obtained. Attention was first focused on the right sphenoid sinus. The entrance to the right sphenoid sinus was carefully enlarged with a combination of microdebrider, mushroom punch, and a Kerrison Ronguer. Polypoid tissue was removed from the right sphenoid sinus. The purulent drainage was suctioned from the right sphenoid sinus. The right sphenoid sinus was copiously irrigated with saline solution. Attention was then focused on the left ethmoid and frontal sinuses. The left middle turbinate was carefully medialized. Polypoid tissue was removed from the ethmoid sinuses. The frontal recess was then carefully enlarged with a combination of curved probe, microdebrider, and up-biting forceps. Polypoid tissue was removed from the frontal recess. A large amount of purulent drainage was suctioned from the left frontal sinus. The left frontal sinus was then copiously irrigated. Hemostasis of the nasal cavities were achieved with nasopore packing.   The care of the patient was turned over to the anesthesiologist.  The patient was awakened from anesthesia without difficulty.  She was extubated and transferred to the recovery room in good condition.  OPERATIVE FINDINGS:  Right sphenoid sinusitis, left ethmoid sinusitis, and left frontal sinusitis.   SPECIMEN: Bilateral sinus contents   FOLLOWUP CARE:  The patient will be discharged home once awake and alert.  She will be placed on clindamycin 300 mg by mouth 4 times a day for 7  days.  Percocet will be given for pain control. The patient will follow up in my office in approximately 1 week.  Shlomie Romig,SUI W 09/15/2014 9:11 AM

## 2014-09-15 NOTE — H&P (Signed)
Cc: Chronic rhinosinusitis  HPI: The patient is a 64 year old female who returns today for evaluation of her persistent rhinosinusitis.  The patient previously underwent bilateral endoscopic sinus surgery to treat her bilateral chronic pansinusitis and nasal obstruction.  Over the past 3 months, the patient has been experiencing persistent bilateral sinusitis with purulent drainage.  She was treated with multiple courses of extended antibiotics including 21 days of clindamycin.  She was also treated with more than 3 courses of prednisone.  She is currently on daily Flonase nasal spray.  However, the patient continues to be symptomatic.  On her CT scan, she was noted to have persistent opacification of her left frontal and ethmoid sinuses.  In addition, acute infection is also noted within the right sphenoid sinus.  The patient continues to complain of anosmia.    Exam: The nasal cavities were decongested and anesthetised with a combination of oxymetazoline and 4% lidocaine solution.  The flexible scope was inserted into the right nasal cavity.  Endoscopy of the inferior and middle meatus was performed.  Edematous mucosa was noted.  Purulent drainage was noted from the right sphenoid sinus. Nasopharynx was clear. The procedure was repeated on the contralateral side.  Purulent drainage was noted from the left middle meatus.  The patient tolerated the procedure well.  Instructions were given to avoid eating or drinking for 2 hours.    Assessment: 1.  Purulent drainage is again noted from the right sphenoid sinus and the left middle meatus.  2.  The CT images and the nasal endoscopy findings are consistent with chronic right sphenoid sinusitis and left frontal and ethmoid sinusitis.    Plan: 1.  The nasal endoscopy findings and the CT images are reviewed with the patient.  2.  In light of the persistent infections, the patient will likely benefit from undergoing endoscopic sinus surgery to treat the three  obstructed sinuses.  The risks, benefits, alternatives and details of the procedure are reviewed with the patient.   3.  The patient would like to proceed with the procedures. We will schedule the procedure in accordance with the family's schedule.

## 2014-09-15 NOTE — Anesthesia Preprocedure Evaluation (Signed)
Anesthesia Evaluation  Patient identified by MRN, date of birth, ID band Patient awake    Reviewed: Allergy & Precautions, NPO status , Patient's Chart, lab work & pertinent test results  History of Anesthesia Complications (+) PONV  Airway Mallampati: I  TM Distance: >3 FB Neck ROM: Full    Dental  (+) Teeth Intact, Dental Advisory Given   Pulmonary  breath sounds clear to auscultation        Cardiovascular hypertension, Pt. on medications Rhythm:Regular Rate:Normal     Neuro/Psych    GI/Hepatic GERD-  Medicated and Controlled,  Endo/Other    Renal/GU      Musculoskeletal   Abdominal   Peds  Hematology   Anesthesia Other Findings   Reproductive/Obstetrics                             Anesthesia Physical Anesthesia Plan  ASA: II  Anesthesia Plan: General   Post-op Pain Management:    Induction: Intravenous  Airway Management Planned: Oral ETT  Additional Equipment:   Intra-op Plan:   Post-operative Plan: Extubation in OR  Informed Consent: I have reviewed the patients History and Physical, chart, labs and discussed the procedure including the risks, benefits and alternatives for the proposed anesthesia with the patient or authorized representative who has indicated his/her understanding and acceptance.   Dental advisory given  Plan Discussed with: Anesthesiologist, CRNA and Surgeon  Anesthesia Plan Comments:         Anesthesia Quick Evaluation

## 2014-09-15 NOTE — Anesthesia Procedure Notes (Signed)
Procedure Name: Intubation Performed by: Terrance Mass Pre-anesthesia Checklist: Patient identified, Timeout performed, Emergency Drugs available, Suction available and Patient being monitored Patient Re-evaluated:Patient Re-evaluated prior to inductionOxygen Delivery Method: Circle system utilized Preoxygenation: Pre-oxygenation with 100% oxygen Intubation Type: IV induction Laryngoscope Size: Miller and 2 Grade View: Grade I Tube type: Oral Tube size: 7.0 mm Number of attempts: 1 Airway Equipment and Method: Stylet Placement Confirmation: ETT inserted through vocal cords under direct vision,  positive ETCO2 and breath sounds checked- equal and bilateral Secured at: 22 cm Tube secured with: Tape Dental Injury: Teeth and Oropharynx as per pre-operative assessment

## 2014-09-15 NOTE — Anesthesia Postprocedure Evaluation (Signed)
  Anesthesia Post-op Note  Patient: [name]  Procedure(s) Performed: Procedure(s): LEFT ENDOSCOPIC TOTAL ETHMOIDECTOMY WITH FUSION IMAGE GUIDANCE NAVIGATION (Left) LEFT ENDOSCOPIC FRONTAL RECESS  EXPLORATION WITH FUSION IMAGE GUIDANCE NAVIGATION (Left) RIGHT ENDOSCOPIC SPHENOIDECTOMY WITH FUSION IMAGE GUIDANCE NAVIGATION (Right)  Patient Location: PACU  Anesthesia Type: General   Level of Consciousness: awake, alert  and oriented  Airway and Oxygen Therapy: Patient Spontanous Breathing  Post-op Pain: mild  Post-op Assessment: Post-op Vital signs reviewed  Post-op Vital Signs: Reviewed  Last Vitals:  Filed Vitals:   09/15/14 0915  BP: 107/90  Pulse: 91  Temp:   Resp: 14    Complications: No apparent anesthesia complications

## 2014-09-16 ENCOUNTER — Encounter (HOSPITAL_BASED_OUTPATIENT_CLINIC_OR_DEPARTMENT_OTHER): Payer: Self-pay | Admitting: Otolaryngology

## 2014-10-13 ENCOUNTER — Other Ambulatory Visit: Payer: Self-pay | Admitting: Internal Medicine

## 2014-10-21 ENCOUNTER — Encounter: Payer: Self-pay | Admitting: Internal Medicine

## 2014-11-09 ENCOUNTER — Other Ambulatory Visit: Payer: Self-pay | Admitting: Internal Medicine

## 2014-11-09 ENCOUNTER — Telehealth: Payer: Self-pay | Admitting: *Deleted

## 2014-11-09 DIAGNOSIS — F330 Major depressive disorder, recurrent, mild: Secondary | ICD-10-CM

## 2014-11-09 NOTE — Telephone Encounter (Signed)
Prescription for Xanax 0.5 mg tablets take 1 tablet by mouth twice daily as needed and take 1-2 tablets at bedtime as needed for anxiety and sleep disp # 90 with no refills per Dr. Daryll Drown called to [facility] .  Patient was called and informed of.  Sander Nephew, RN 11/09/2014 5:41 PM.

## 2014-11-09 NOTE — Telephone Encounter (Signed)
Patient requesting xanax.  If called in please call in the [facility] on [address].

## 2014-11-09 NOTE — Telephone Encounter (Signed)
Pt states she usually gets a 66mth supply.Despina Hidden Cassady10/17/20164:44 PM

## 2014-11-10 ENCOUNTER — Encounter: Payer: Self-pay | Admitting: Internal Medicine

## 2014-11-10 ENCOUNTER — Ambulatory Visit (INDEPENDENT_AMBULATORY_CARE_PROVIDER_SITE_OTHER): Payer: Medicare Other | Admitting: Internal Medicine

## 2014-11-10 VITALS — BP 128/54 | HR 84 | Temp 98.0°F | Wt 180.9 lb

## 2014-11-10 DIAGNOSIS — S39011A Strain of muscle, fascia and tendon of abdomen, initial encounter: Secondary | ICD-10-CM

## 2014-11-10 DIAGNOSIS — R1011 Right upper quadrant pain: Secondary | ICD-10-CM | POA: Diagnosis not present

## 2014-11-10 DIAGNOSIS — Z23 Encounter for immunization: Secondary | ICD-10-CM | POA: Diagnosis not present

## 2014-11-10 DIAGNOSIS — F332 Major depressive disorder, recurrent severe without psychotic features: Secondary | ICD-10-CM | POA: Diagnosis not present

## 2014-11-10 DIAGNOSIS — R109 Unspecified abdominal pain: Secondary | ICD-10-CM

## 2014-11-10 MED ORDER — ALPRAZOLAM 0.5 MG PO TABS: ORAL_TABLET | ORAL | Status: DC

## 2014-11-10 NOTE — Patient Instructions (Signed)
-   Continue taking Advil for your abdominal pain but limit the total amount to 3,000 mg per day - Try to cut down on this gradually - Can also try heating pad to help pain  General Instructions:   Please bring your medicines with you each time you come to clinic.  Medicines may include prescription medications, over-the-counter medications, herbal remedies, eye drops, vitamins, or other pills.   Progress Toward Treatment Goals:  Treatment Goal 08/05/2014  Blood pressure unchanged  Prevent falls -    Self Care Goals & Plans:  Self Care Goal 11/10/2014  Manage my medications take my medicines as prescribed; refill my medications on time; bring my medications to every visit  Monitor my health keep track of my blood pressure  Eat healthy foods eat more vegetables; eat foods that are low in salt; eat baked foods instead of fried foods  Be physically active find an activity I enjoy  Prevent falls -    No flowsheet data found.   Care Management & Community Referrals:  Referral 07/01/2014  Referrals made for care management support none needed  Referrals made to community resources -

## 2014-11-11 LAB — CMP14 + ANION GAP
ALK PHOS: 145 IU/L — AB (ref 39–117)
ALT: 18 IU/L (ref 0–32)
ANION GAP: 18 mmol/L (ref 10.0–18.0)
AST: 17 IU/L (ref 0–40)
Albumin/Globulin Ratio: 1.1 (ref 1.1–2.5)
Albumin: 3.9 g/dL (ref 3.6–4.8)
BUN/Creatinine Ratio: 12 (ref 11–26)
BUN: 9 mg/dL (ref 8–27)
Bilirubin Total: 0.3 mg/dL (ref 0.0–1.2)
CO2: 23 mmol/L (ref 18–29)
CREATININE: 0.73 mg/dL (ref 0.57–1.00)
Calcium: 9.4 mg/dL (ref 8.7–10.3)
Chloride: 101 mmol/L (ref 97–106)
GFR calc Af Amer: 99 mL/min/{1.73_m2} (ref >59)
GFR calc non Af Amer: 86 mL/min/{1.73_m2} (ref >59)
GLUCOSE: 94 mg/dL (ref 65–99)
Globulin, Total: 3.4 g/dL (ref 1.5–4.5)
Potassium: 3.6 mmol/L (ref 3.5–5.2)
Sodium: 142 mmol/L (ref 136–144)
Total Protein: 7.3 g/dL (ref 6.0–8.5)

## 2014-11-13 DIAGNOSIS — S39011A Strain of muscle, fascia and tendon of abdomen, initial encounter: Secondary | ICD-10-CM | POA: Insufficient documentation

## 2014-11-13 NOTE — Assessment & Plan Note (Signed)
Her depression is concerning, especially with her occasional passive suicidal thoughts. I encouraged her to start walking daily and to try to find activities in the community that she can join to meet friends. I also encouraged her to come to clinic if she needs someone to talk to or if she feels she is in danger of harming herself. She declined referral to a psychotherapist.  - Continue Lexapro 20 mg daily - Follow up in 1 month to assess depression symptoms

## 2014-11-13 NOTE — Progress Notes (Signed)
   Subjective:    Patient ID: [name], female    DOB: [birth year], 64 y.o.   MRN: [id number]  HPI [name] is a 64yo woman with PMHx of HTN, GERD, osteoarthritis, and major depression who presents today with right-sided upper abdominal pain.  She states the pain started 3 days ago and is actually better today. She describes the pain as 4/10 in severity, worse with deep inspiration and with certain movements of her upper extremity, and "sore" in nature. She denies any heavy lifting or trauma to the area. She states the pain improves after taking advil. She reports taking up to 1000 mg four times daily for the past few weeks. She has been taking higher dose Advil to help her OA pain. She states she did do some light exercise the day before the pain started but didn't think it was rigorous. She denies any association with food. She denies any associated fevers, chills, shortness of breath, nausea, vomiting, or diarrhea.   Patient also describes very depressed. She states she is estranged from her daughter and has tried to reach out to her multiple times with no success. She does not know why her daughter is so upset with her. She states "the only thing I have to live for is my dogs." She states she does not have many friends and no other family in town. She reports she has thought about "ending it all" but would never go through with it because her dogs depend on her. She reports no specific plan. She states she has been on Lexapro for many years, but feels it does not help much.    Review of Systems General: Denies fever, chills, night sweats, changes in weight, changes in appetite HEENT: Denies headaches, ear pain, changes in vision, rhinorrhea, sore throat CV: Denies CP, palpitations, SOB, orthopnea Pulm: Denies SOB, cough, wheezing GI: Denies abdominal pain, nausea, vomiting, diarrhea, constipation, melena, hematochezia GU: Denies dysuria, hematuria, frequency Msk: Denies muscle  cramps, joint pains Neuro: Denies weakness, numbness, tingling Skin: Denies rashes, bruising Psych: Denies depression, anxiety, hallucinations    Objective:   Physical Exam General: alert, pleasant, tearful at times HEENT: [place]/AT, EOMI, sclera anicteric, mucus membranes moist Chest: RRR, no m/g/r. No tenderness to palpation of chest/ribs.  Pulm: CTA bilaterally, breaths non-labored Abd: BS+, soft, mild tenderness in the RUQ, no organomegaly Ext: warm, no peripheral edema. ROM is not limited in upper or lower extremities.  Neuro: alert and oriented x 3    Assessment & Plan:  Please refer to A&P documentation.

## 2014-11-13 NOTE — Assessment & Plan Note (Signed)
Her pain seems most consistent with a muscle strain. Her pain is already improving after Advil use. I instructed her to decrease her ibuprofen use since she is taking above the recommended daily dose. Also will check a CMP to evaluate her LFTs as she did have some RUQ tenderness.  - Decrease ibuprofen to 1000 mg TID max. I advised her to take 800 mg Q8H and gradually titrate down. She had mentioned headaches as well which could be medication overuse headaches since she seems to be taking high dose ibuprofen for awhile now.  - Check CMP>> Alk phos mildly elevated at 145 but otherwise LFTs normal - Advised to use heating pads/hot compresses - Instructed to return to clinic if pain does not improve by next week

## 2014-11-16 ENCOUNTER — Encounter: Payer: Self-pay | Admitting: Internal Medicine

## 2014-11-16 ENCOUNTER — Ambulatory Visit (INDEPENDENT_AMBULATORY_CARE_PROVIDER_SITE_OTHER): Payer: Medicare Other | Admitting: Internal Medicine

## 2014-11-16 VITALS — BP 135/64 | HR 72 | Temp 97.9°F | Ht 60.5 in | Wt 184.5 lb

## 2014-11-16 DIAGNOSIS — R3915 Urgency of urination: Secondary | ICD-10-CM

## 2014-11-16 DIAGNOSIS — N3001 Acute cystitis with hematuria: Secondary | ICD-10-CM

## 2014-11-16 DIAGNOSIS — B9689 Other specified bacterial agents as the cause of diseases classified elsewhere: Secondary | ICD-10-CM | POA: Diagnosis not present

## 2014-11-16 DIAGNOSIS — N390 Urinary tract infection, site not specified: Secondary | ICD-10-CM

## 2014-11-16 LAB — POCT URINALYSIS DIPSTICK
Bilirubin, UA: NEGATIVE
Glucose, UA: NEGATIVE
Ketones, UA: NEGATIVE
Nitrite, UA: NEGATIVE
PROTEIN UA: NEGATIVE
SPEC GRAV UA: 1.015
UROBILINOGEN UA: 0.2
pH, UA: 7

## 2014-11-16 MED ORDER — NITROFURANTOIN MONOHYD MACRO 100 MG PO CAPS: 100.0000 mg | ORAL_CAPSULE | Freq: Two times a day (BID) | ORAL | Status: AC

## 2014-11-16 NOTE — Assessment & Plan Note (Signed)
Has urinary urgency, frequency, and burning with urination for last 3 days. Had UTI long time ago,does not remember which abx she received that time. No travel recently, no other risk factors. No fevers, chills, n/v, cva tenderness.   Urine dipstick showing small leuk, neg nitrite, trace blood.   Will treat with macrobid for 7 days. If not better, then will bring her in to collect UA and Ucx.

## 2014-11-16 NOTE — Progress Notes (Signed)
   Subjective:    Patient ID: [name], female    DOB: 1950/09/24, 64 y.o.   MRN: [id number]  HPI  64 yo female with HTN, anxiety, GERD, OA, HLD, pre-diabetes, here with burning with urination for 3 days.  Has urinary urgency, frequency, and burning with urination for last 3 days. Had UTI long time ago,does not remember which abx she received that time. No travel recently, no other risk factors. No fevers, chills, n/v, cva tenderness.   See problem based a&p.  Review of Systems  Constitutional: Negative for fever, chills and fatigue.  HENT: Negative for congestion and sore throat.   Eyes: Negative for photophobia and visual disturbance.  Respiratory: Negative for chest tightness and shortness of breath.   Cardiovascular: Negative for chest pain.  Gastrointestinal: Negative for nausea, vomiting, abdominal pain, diarrhea and abdominal distention.  Genitourinary: Positive for dysuria, urgency and frequency. Negative for hematuria, flank pain, decreased urine volume, vaginal bleeding and vaginal pain.  Musculoskeletal: Negative for myalgias and back pain.  Neurological: Negative for dizziness and numbness.       Objective:   Physical Exam  Constitutional: She is oriented to person, place, and time. She appears well-developed and well-nourished. No distress.  HENT:  Head: Normocephalic and atraumatic.  Eyes: Conjunctivae are normal. Pupils are equal, round, and reactive to light.  Neck: Normal range of motion. Neck supple.  Cardiovascular: Normal rate and regular rhythm.  Exam reveals no gallop and no friction rub.   No murmur heard. Pulmonary/Chest: Effort normal and breath sounds normal. No respiratory distress. She has no wheezes. She has no rales. She exhibits no tenderness.  Abdominal: Soft. Bowel sounds are normal. She exhibits no distension. There is no tenderness.  No cva tenderness  Musculoskeletal: Normal range of motion. She exhibits no edema or tenderness.    Neurological: She is alert and oriented to person, place, and time. No cranial nerve deficit.  Skin: Skin is warm.     Filed Vitals:   11/16/14 1434  BP: 135/64  Pulse: 72  Temp: 97.9 F (36.6 C)        Assessment & Plan:  See problem based a&p.

## 2014-11-16 NOTE — Patient Instructions (Signed)
Please take Macrobid twice a day for 7 days for UTI. If you are not better in 1 week please come back and see [place].

## 2014-11-17 ENCOUNTER — Other Ambulatory Visit: Payer: Self-pay | Admitting: Otolaryngology

## 2014-11-17 NOTE — Progress Notes (Signed)
Case discussed with Dr. Arcelia Jew soon after the resident saw the patient.  We reviewed the resident's history and exam and pertinent patient test results.  I agree with the assessment, diagnosis, and plan of care documented in the resident's note.  If her depression symptoms continue despite increased activity and socialization we will consider changing to an alternative anti-depressant in hopes of improving her symptoms.

## 2014-11-18 NOTE — Progress Notes (Signed)
Internal Medicine Clinic Attending  Case discussed with Dr. Ahmed at the time of the visit.  We reviewed the resident's history and exam and pertinent patient test results.  I agree with the assessment, diagnosis, and plan of care documented in the resident's note. 

## 2014-11-25 ENCOUNTER — Telehealth: Payer: Self-pay | Admitting: Internal Medicine

## 2014-11-25 NOTE — Telephone Encounter (Signed)
Pt requesting antibiotic for UTI

## 2014-11-25 NOTE — Telephone Encounter (Signed)
Spoke with patient, she reports her UTI symptoms persist even after completion of antibiotic.  Pt also reporting "really high fever" 2 nights ago that went away after a few hours, advil, and ice application. Chart review indicates pt was told to return to clinic for urine culture if symptoms persisted.  Appointment scheduled for tomorrow afternoon with Dr. Merrilyn Puma

## 2014-11-26 ENCOUNTER — Encounter: Payer: Self-pay | Admitting: Internal Medicine

## 2014-11-26 ENCOUNTER — Ambulatory Visit (INDEPENDENT_AMBULATORY_CARE_PROVIDER_SITE_OTHER): Payer: Medicare Other | Admitting: Internal Medicine

## 2014-11-26 VITALS — BP 117/62 | HR 95 | Temp 97.8°F | Ht 60.5 in | Wt 181.6 lb

## 2014-11-26 DIAGNOSIS — F331 Major depressive disorder, recurrent, moderate: Secondary | ICD-10-CM | POA: Diagnosis not present

## 2014-11-26 DIAGNOSIS — J329 Chronic sinusitis, unspecified: Secondary | ICD-10-CM

## 2014-11-26 DIAGNOSIS — J324 Chronic pansinusitis: Secondary | ICD-10-CM

## 2014-11-26 DIAGNOSIS — N3000 Acute cystitis without hematuria: Secondary | ICD-10-CM

## 2014-11-26 DIAGNOSIS — I10 Essential (primary) hypertension: Secondary | ICD-10-CM | POA: Diagnosis not present

## 2014-11-26 NOTE — Assessment & Plan Note (Addendum)
BP Readings from Last 3 Encounters:  11/26/14 117/62  11/16/14 135/64  11/10/14 128/54    Lab Results  Component Value Date   NA 142 11/10/2014   K 3.6 11/10/2014   CREATININE 0.73 11/10/2014    Assessment: Blood pressure control:  well-controlled Progress toward BP goal:   at goal Comments: on norvasc 2.5mg  QD  Plan: Medications:  continue current medications Educational resources provided:   Self management tools provided:   Other plans:

## 2014-11-26 NOTE — Assessment & Plan Note (Signed)
We spoke for roughly 1 hour today in the clinic. She does continue to have passive SI, particularly with feelings of worthlessness and hopelessness, especially surrounding her son-in-law's death at 98yrs from metastatic colon cancer, her alienation from the rest of her family in [place] (especially her daughter), and that 'everyone around her is dying.' We discussed at length the things she enjoys or has at least in the past, such as dog-sitting and volunteering at the local greyhound shelter, and I encouraged her to try and get back involved with those activities.  -Continue Lexapro 20mg  QD -Continue to monitor symptoms - short f/u recommended -Offered further counseling - patient declined at this point given her negative experiences with psychiatrists/therapists in the past.

## 2014-11-26 NOTE — Assessment & Plan Note (Signed)
Pt has completed course of macrobid x7 d and says her symptoms of dysuria and urinary irritation have resolved.

## 2014-11-26 NOTE — Assessment & Plan Note (Signed)
Patient says that she still occasionally has sinus pressure and has difficulty hearing out of her L ear, but is much improved since her surgery. -F/u with ENT PRN

## 2014-11-26 NOTE — Progress Notes (Signed)
   Subjective:    Patient ID: [name], female    DOB: 07-Jan-1951, 64 y.o.   MRN: [id number]  HPI [name] is a 64 y.o. with PMH of HTN, GERD, OA, chronic sinusitis s/p sinus surgery and major depressive disorder with SI who presents to [facility] today for routine follow-up. She says that she has had subjective fevers/chills for the past 2 days without any other symptoms and then woke up this morning feeling 'achy all over'. She has no other symptoms at this time. Please see problem-based charting for further pertinent information.    Review of Systems  Constitutional: Positive for activity change and fatigue. Negative for fever, chills and diaphoresis.  HENT: Positive for sinus pressure. Negative for ear pain and sore throat.   Eyes: Negative for visual disturbance.  Respiratory: Negative for cough, shortness of breath and wheezing.   Cardiovascular: Negative for chest pain, palpitations and leg swelling.  Gastrointestinal: Negative for nausea, vomiting, abdominal pain, diarrhea, constipation and blood in stool.  Endocrine: Negative for polyuria.  Genitourinary: Negative for dysuria, urgency, frequency, flank pain, decreased urine volume and difficulty urinating.  Musculoskeletal: Positive for myalgias and arthralgias. Negative for back pain and gait problem.  Skin: Negative for rash.  Neurological: Negative for dizziness, syncope, weakness, numbness and headaches.  Hematological: Does not bruise/bleed easily.  Psychiatric/Behavioral: Positive for suicidal ideas, sleep disturbance, dysphoric mood and decreased concentration. Negative for hallucinations, confusion, self-injury and agitation. The patient is not nervous/anxious and is not hyperactive.   All other systems reviewed and are negative.      Objective:   Physical Exam  Constitutional: She is oriented to person, place, and time. She appears well-nourished. No distress.  HENT:  Head: Normocephalic and  atraumatic.  Right Ear: External ear normal.  Left Ear: External ear normal.  Eyes: Conjunctivae are normal. Pupils are equal, round, and reactive to light.  Neck: Normal range of motion. Neck supple.  Cardiovascular: Normal rate, regular rhythm, normal heart sounds and intact distal pulses.  Exam reveals no gallop and no friction rub.   No murmur heard. Pulmonary/Chest: Effort normal and breath sounds normal. She has no wheezes. She has no rales.  Abdominal: Soft. Bowel sounds are normal. She exhibits no distension. There is no tenderness.  Musculoskeletal: Normal range of motion. She exhibits no edema or tenderness.  Neurological: She is alert and oriented to person, place, and time. She has normal reflexes. No cranial nerve deficit. Coordination normal.  Skin: Skin is warm. No rash noted. She is not diaphoretic. No erythema.  Psychiatric: Her behavior is normal. Judgment and thought content normal.  Depressed mood. Patient is very conversant and laughs as we talk but does intermittently endorse thoughts of worthlessness, avolition, and anhedonia, and that "life isn't worth living". She endorses passive SI but no active SI, without plan, no previous attempts and no intention to act.  Nursing note and vitals reviewed.         Assessment & Plan:

## 2014-11-27 ENCOUNTER — Telehealth: Payer: Self-pay | Admitting: Internal Medicine

## 2014-11-27 NOTE — Telephone Encounter (Signed)
Pt calls and states she has quit taking AZO and now has uti symptoms- fever but she has not checked her temperature, she only has a dog thermometer She is having urgency with very little urine when she goes. She also hurts all over her body, shoulders, arms, legs ...everywhere She states she addressed all these things at her appt yesterday but nothing was done She states she has an appt 11/9 w/ dr Daryll Drown and will be here but when she comes she needs lab work and a urine done She is tired of hurting Please advise

## 2014-11-27 NOTE — Telephone Encounter (Signed)
Pt requesting antibiotic for UTI. Please call pt back.

## 2014-11-30 NOTE — Addendum Note (Signed)
Addended by: Gilles Chiquito B on: 11/30/2014 11:58 AM   Modules accepted: Level of Service

## 2014-11-30 NOTE — Telephone Encounter (Signed)
I am not sure what to make of this call b/c I saw her with the resident and she did not mention any of this to either of [place] in the room.  Regardless, she will need a urine sample prior to any medications being prescribed for a possible UTI.  If she would like to come in sooner and leave a urine sample only, and get CBC/BMET, I can place those orders and have her come in for a lab visit today or tomorrow.  Otherwise, she can come to her appointment on Wednesday.  Thanks!

## 2014-11-30 NOTE — Progress Notes (Signed)
Internal Medicine Clinic Attending  I saw and evaluated the patient.  I personally confirmed the key portions of the history and exam documented by Dr. Merrilyn Puma and I reviewed pertinent patient test results.  The assessment, diagnosis, and plan were formulated together and I agree with the documentation in the resident's note.  Of note, Ms. Koegel reported that her symptoms of achiness had completely resolved when seen and she did not feel she needed any further investigation at that time.

## 2014-12-01 NOTE — Telephone Encounter (Signed)
She had told triage that she had problems w/ transportation

## 2014-12-02 ENCOUNTER — Encounter: Payer: Self-pay | Admitting: Internal Medicine

## 2014-12-02 ENCOUNTER — Telehealth: Payer: Self-pay | Admitting: *Deleted

## 2014-12-02 ENCOUNTER — Ambulatory Visit (INDEPENDENT_AMBULATORY_CARE_PROVIDER_SITE_OTHER): Payer: Medicare Other | Admitting: Internal Medicine

## 2014-12-02 VITALS — BP 143/66 | HR 84 | Temp 98.1°F | Ht 61.0 in | Wt 186.7 lb

## 2014-12-02 DIAGNOSIS — M791 Myalgia, unspecified site: Secondary | ICD-10-CM

## 2014-12-02 DIAGNOSIS — R45851 Suicidal ideations: Secondary | ICD-10-CM | POA: Diagnosis not present

## 2014-12-02 DIAGNOSIS — Z09 Encounter for follow-up examination after completed treatment for conditions other than malignant neoplasm: Secondary | ICD-10-CM

## 2014-12-02 DIAGNOSIS — F331 Major depressive disorder, recurrent, moderate: Secondary | ICD-10-CM

## 2014-12-02 DIAGNOSIS — R0602 Shortness of breath: Secondary | ICD-10-CM | POA: Diagnosis not present

## 2014-12-02 DIAGNOSIS — F330 Major depressive disorder, recurrent, mild: Secondary | ICD-10-CM

## 2014-12-02 DIAGNOSIS — N390 Urinary tract infection, site not specified: Secondary | ICD-10-CM

## 2014-12-02 DIAGNOSIS — N3000 Acute cystitis without hematuria: Secondary | ICD-10-CM

## 2014-12-02 MED ORDER — ALPRAZOLAM 0.5 MG PO TABS: ORAL_TABLET | ORAL | Status: DC

## 2014-12-02 NOTE — Telephone Encounter (Signed)
Rx called into [facility], phone call complete.Despina Hidden Cassady11/9/20163:38 PM

## 2014-12-02 NOTE — Assessment & Plan Note (Signed)
Patient continues to struggle with depression and senses of loss from not having a relationship with her daughter.  These symptoms are exacerbated by the upcoming holiday season and recent illness of herself and her dogs.  She is in a low mood today.  She is resistant to changing her medications or having any new therapy added to her regimen.  She is resistant to seeing a therapist or seeking out financial assistance.  I will continue to offer these services to her.

## 2014-12-02 NOTE — Assessment & Plan Note (Signed)
She reports improvement and resolution of symptoms, will check UA b/c she was also concerned that this wasn't done to confirm resolution.

## 2014-12-02 NOTE — Patient Instructions (Signed)
General Instructions: Please schedule a follow up visit within the next 3-4 months, sooner if needed.   For your medications:   Please bring all of your pill  Bottles with you to each visit.  This will help make sure that we have an up to date list of all the medications you are taking.  Please also bring any over the counter herbal medications you are taking (not including advil, tylenol, etc.)  Please continue taking your medications as prescribed.  We will discuss at next visit changing your antidepressant regimen to see if this helps with your continuing symptoms.   For your SOB and wheezing while walking, please try albuterol as needed.  Sample provided.  More information about the medication below.   Thank you!   Treatment Goals:  Goals (1 Years of Data) as of 12/02/14      Lifestyle   . Prevent Falls       Progress Toward Treatment Goals:  Treatment Goal 12/02/2014  Blood pressure at goal  Prevent falls -    Self Care Goals & Plans:  Self Care Goal 12/02/2014  Manage my medications take my medicines as prescribed; bring my medications to every visit; refill my medications on time  Monitor my health -  Eat healthy foods drink diet soda or water instead of juice or soda; eat more vegetables; eat foods that are low in salt  Be physically active take a walk every day  Prevent falls -    No flowsheet data found.   Care Management & Community Referrals:  Referral 07/01/2014  Referrals made for care management support none needed  Referrals made to community resources -     Albuterol inhalation aerosol What is this medicine? ALBUTEROL (al Normajean [place]) is a bronchodilator. It helps open up the airways in your lungs to make it easier to breathe. This medicine is used to treat and to prevent bronchospasm. This medicine may be used for other purposes; ask your health care provider or pharmacist if you have questions. What should I tell my health care provider before I  take this medicine? They need to know if you have any of the following conditions: -diabetes -heart disease or irregular heartbeat -high blood pressure -pheochromocytoma -seizures -thyroid disease -an unusual or allergic reaction to albuterol, levalbuterol, sulfites, other medicines, foods, dyes, or preservatives -pregnant or trying to get pregnant -breast-feeding How should I use this medicine? This medicine is for inhalation through the mouth. Follow the directions on your prescription label. Take your medicine at regular intervals. Do not use more often than directed. Make sure that you are using your inhaler correctly. Ask you doctor or health care provider if you have any questions. Talk to your pediatrician regarding the use of this medicine in children. Special care may be needed. Overdosage: If you think you have taken too much of this medicine contact a poison control center or emergency room at once. NOTE: This medicine is only for you. Do not share this medicine with others. What if I miss a dose? If you miss a dose, use it as soon as you can. If it is almost time for your next dose, use only that dose. Do not use double or extra doses. What may interact with this medicine? -anti-infectives like chloroquine and pentamidine -caffeine -cisapride -diuretics -medicines for colds -medicines for depression or for emotional or psychotic conditions -medicines for weight loss including some herbal products -methadone -some antibiotics like clarithromycin, erythromycin, levofloxacin, and linezolid -some  heart medicines -steroid hormones like dexamethasone, cortisone, hydrocortisone -theophylline -thyroid hormones This list may not describe all possible interactions. Give your health care provider a list of all the medicines, herbs, non-prescription drugs, or dietary supplements you use. Also tell them if you smoke, drink alcohol, or use illegal drugs. Some items may interact with  your medicine. What should I watch for while using this medicine? Tell your doctor or health care professional if your symptoms do not improve. Do not use extra albuterol. If your asthma or bronchitis gets worse while you are using this medicine, call your doctor right away. If your mouth gets dry try chewing sugarless gum or sucking hard candy. Drink water as directed. What side effects may I notice from receiving this medicine? Side effects that you should report to your doctor or health care professional as soon as possible: -allergic reactions like skin rash, itching or hives, swelling of the face, lips, or tongue -breathing problems -chest pain -feeling faint or lightheaded, falls -high blood pressure -irregular heartbeat -fever -muscle cramps or weakness -pain, tingling, numbness in the hands or feet -vomiting Side effects that usually do not require medical attention (report to your doctor or health care professional if they continue or are bothersome): -cough -difficulty sleeping -headache -nervousness or trembling -stomach upset -stuffy or runny nose -throat irritation -unusual taste This list may not describe all possible side effects. Call your doctor for medical advice about side effects. You may report side effects to FDA at 1-800-FDA-1088. Where should I keep my medicine? Keep out of the reach of children. Store at room temperature between 15 and 30 degrees C (59 and 86 degrees F). The contents are under pressure and may burst when exposed to heat or flame. Do not freeze. This medicine does not work as well if it is too cold. Throw away any unused medicine after the expiration date. Inhalers need to be thrown away after the labeled number of puffs have been used or by the expiration date; whichever comes first. Ventolin HFA should be thrown away 12 months after removing from foil pouch. Check the instructions that come with your medicine. NOTE: This sheet is a summary. It  may not cover all possible information. If you have questions about this medicine, talk to your doctor, pharmacist, or health care provider.    2016, [employer]vier/Gold Standard. (2012-06-27 10:57:17)

## 2014-12-02 NOTE — Progress Notes (Signed)
Subjective:    Patient ID: [name], female    DOB: 1950-10-23, 64 y.o.   MRN: [id number]  CC: Chills at night  HPI   [name] is a 64yo woman with PMH of Dep/Anx, possible HTN, GERD, chronic sinusitis and recent cystitis treated with macrobid.    Today, she presents telling me about her cystitis experience.  She was seen on 11/16/14 for a cystitis.  She was treated with macrobid for 7 days after that.  She noted that 5 days prior to this, she had a fever, feeling of being hot, chills.  These symptoms were at night.  She took advil and they resolved around 130am.  She also had myalgias including in her shoulder, legs and back.  She did not check her temperature.  She had dysuria and frequency with decreased stream.  She took AZO over the counter during the antibiotics as well.  She says that these symptoms are "completely resolved" now, but she was upset b/c she did not feel like Dr. Merrilyn Puma took her seriously when she brought them up.    She also complains of new SOB when she walks her dog.  She has associated cough and wheezing.  She has a chronic cough and is irritated that she continues to have sinus issues.    Had her flu shot.    Social situation continues to be poor, she has multiple bills from her recent ENT surgeries and is worried about being sent to collections.  The holidays are difficult for her since she has limited contact with her family (daughter and grandchildren).  She has 2 dogs which are both sick at the moment.  She has continued passive suicidality which she expresses every time I see her.   Review of Systems  Constitutional: Positive for chills and fatigue. Negative for fever.  HENT: Positive for congestion, facial swelling, postnasal drip, rhinorrhea and sinus pressure.   Respiratory: Positive for cough and shortness of breath.   Gastrointestinal: Negative for nausea and vomiting.  Genitourinary: Negative for dysuria, urgency, frequency, decreased  urine volume, enuresis and difficulty urinating.  Musculoskeletal: Positive for myalgias, back pain and arthralgias. Negative for gait problem.  Psychiatric/Behavioral: Positive for dysphoric mood and agitation. The patient is nervous/anxious.        Objective:   Physical Exam  Constitutional: She is oriented to person, place, and time. She appears well-developed and well-nourished. No distress.  HENT:  Head: Normocephalic and atraumatic.  Cardiovascular: Normal rate, regular rhythm and normal heart sounds.   No murmur heard. Pulmonary/Chest: Effort normal. No respiratory distress. She has wheezes (RUL). She exhibits no tenderness.  Abdominal: Soft. Bowel sounds are normal. She exhibits no distension.  Musculoskeletal: She exhibits no edema or tenderness.  Neurological: She is alert and oriented to person, place, and time.  Psychiatric: Her affect is labile. Her speech is not rapid and/or pressured, not delayed and not tangential. She is not aggressive, not hyperactive, not slowed and not withdrawn. Thought content is not paranoid. She exhibits a depressed mood. She expresses suicidal (passive, chronic) ideation. She expresses no homicidal ideation.    CBC, UA today      Assessment & Plan:    Sample provided, Albuterol inhaler.  Take 1 puff inhaled prn SOB and wheezing.   Lot #  [id number] Exp. Date Feb 2018  Patient has been instructed regarding the correct time, dose, and frequency of taking this medication, including its desired effects and most common side effects.  Myalgias Assessment: This is a new problem for her.  She reports resolution during one point of the visit and also some continued aching.  She is not on a statin or any other obvious causative agent.    Plan:  Check CK and ESR to start.

## 2014-12-03 LAB — URINALYSIS, COMPLETE
Bilirubin, UA: NEGATIVE
Glucose, UA: NEGATIVE
Ketones, UA: NEGATIVE
NITRITE UA: NEGATIVE
PH UA: 6 (ref 5.0–7.5)
SPEC GRAV UA: 1.021 (ref 1.005–1.030)
UUROB: 0.2 mg/dL (ref 0.2–1.0)

## 2014-12-03 LAB — CBC WITH DIFFERENTIAL/PLATELET
Basophils Absolute: 0.1 10*3/uL (ref 0.0–0.2)
Basos: 1 %
EOS (ABSOLUTE): 0.3 10*3/uL (ref 0.0–0.4)
EOS: 4 %
HEMATOCRIT: 35.3 % (ref 34.0–46.6)
HEMOGLOBIN: 11.3 g/dL (ref 11.1–15.9)
IMMATURE GRANULOCYTES: 1 %
Immature Grans (Abs): 0.1 10*3/uL (ref 0.0–0.1)
LYMPHS ABS: 2.6 10*3/uL (ref 0.7–3.1)
Lymphs: 35 %
MCH: 27 pg (ref 26.6–33.0)
MCHC: 32 g/dL (ref 31.5–35.7)
MCV: 84 fL (ref 79–97)
MONOCYTES: 9 %
Monocytes Absolute: 0.6 10*3/uL (ref 0.1–0.9)
NEUTROS PCT: 50 %
Neutrophils Absolute: 3.8 10*3/uL (ref 1.4–7.0)
Platelets: 359 10*3/uL (ref 150–379)
RBC: 4.18 x10E6/uL (ref 3.77–5.28)
RDW: 14.5 % (ref 12.3–15.4)
WBC: 7.4 10*3/uL (ref 3.4–10.8)

## 2014-12-03 LAB — MICROSCOPIC EXAMINATION: CASTS: NONE SEEN /LPF

## 2014-12-03 LAB — CK: Total CK: 38 U/L (ref 24–173)

## 2014-12-03 LAB — SEDIMENTATION RATE: SED RATE: 20 mm/h (ref 0–40)

## 2014-12-03 NOTE — Addendum Note (Signed)
Addended by: Truddie Crumble on: 12/03/2014 01:32 PM   Modules accepted: Orders

## 2014-12-04 LAB — SPECIMEN STATUS REPORT

## 2014-12-05 ENCOUNTER — Encounter: Payer: Self-pay | Admitting: Internal Medicine

## 2014-12-06 LAB — URINE CULTURE

## 2014-12-07 ENCOUNTER — Telehealth: Payer: Self-pay | Admitting: *Deleted

## 2014-12-07 NOTE — Telephone Encounter (Signed)
If you don't mind calling her, would appreciate it!  If [facility] is going to only do 1 month at a time, is there any way they can send me something to refill monthly?  How does that work?    Thanks!

## 2014-12-07 NOTE — Telephone Encounter (Signed)
Dr Daryll Drown, i called [facility] this am as soon as i read the email. 1) it was called in on the 9th as kaye stated in her note 2) i called this am and they confirmed kaye calling 11/9 and that it was sent out this am, the tech could not explain why there has been a holdup except that it was because it was a phone in script??? Also she states they cannot do 3 months at a time on a controlled substance, i did not speak with a pharmacist Do you want to call the pt or do you want me to? Call me at [phone] or pt at [phone]

## 2014-12-07 NOTE — Telephone Encounter (Signed)
Called [facility], spoke w/ a pharmacist, she states that  [place] law prohibits the company from sending 3 months of xanax at a time, she states the month's worth was sent this am Called pt, explained to her that [place] law prohibits [facility] from sending 3 months at a time, she states [facility] and [facility] to fill xanax. She states she understands that it is the law but she thought they had sent her 3 months worth before

## 2014-12-08 ENCOUNTER — Encounter: Payer: Self-pay | Admitting: Student

## 2014-12-14 ENCOUNTER — Encounter (HOSPITAL_BASED_OUTPATIENT_CLINIC_OR_DEPARTMENT_OTHER): Payer: Self-pay

## 2014-12-14 ENCOUNTER — Ambulatory Visit (HOSPITAL_BASED_OUTPATIENT_CLINIC_OR_DEPARTMENT_OTHER): Admit: 2014-12-14 | Payer: Medicare Other | Admitting: Otolaryngology

## 2014-12-14 SURGERY — EXCISION, CYST, EAR
Anesthesia: Monitor Anesthesia Care | Laterality: Left

## 2015-03-11 ENCOUNTER — Other Ambulatory Visit: Payer: Self-pay | Admitting: Internal Medicine

## 2015-03-30 ENCOUNTER — Telehealth: Payer: Self-pay | Admitting: Internal Medicine

## 2015-03-30 NOTE — Telephone Encounter (Signed)
APPT. REMINDER CALL, LMTCB °

## 2015-03-31 ENCOUNTER — Ambulatory Visit (INDEPENDENT_AMBULATORY_CARE_PROVIDER_SITE_OTHER): Payer: Medicare Other | Admitting: Internal Medicine

## 2015-03-31 ENCOUNTER — Encounter: Payer: Self-pay | Admitting: Internal Medicine

## 2015-03-31 VITALS — BP 140/69 | HR 82 | Temp 98.0°F | Ht 61.0 in | Wt 182.8 lb

## 2015-03-31 DIAGNOSIS — R51 Headache: Secondary | ICD-10-CM | POA: Diagnosis not present

## 2015-03-31 DIAGNOSIS — F330 Major depressive disorder, recurrent, mild: Secondary | ICD-10-CM

## 2015-03-31 DIAGNOSIS — R519 Headache, unspecified: Secondary | ICD-10-CM

## 2015-03-31 DIAGNOSIS — M150 Primary generalized (osteo)arthritis: Secondary | ICD-10-CM | POA: Diagnosis not present

## 2015-03-31 DIAGNOSIS — I10 Essential (primary) hypertension: Secondary | ICD-10-CM | POA: Diagnosis not present

## 2015-03-31 DIAGNOSIS — K219 Gastro-esophageal reflux disease without esophagitis: Secondary | ICD-10-CM

## 2015-03-31 DIAGNOSIS — F32A Depression, unspecified: Secondary | ICD-10-CM

## 2015-03-31 DIAGNOSIS — F329 Major depressive disorder, single episode, unspecified: Secondary | ICD-10-CM

## 2015-03-31 DIAGNOSIS — F419 Anxiety disorder, unspecified: Secondary | ICD-10-CM

## 2015-03-31 DIAGNOSIS — M159 Polyosteoarthritis, unspecified: Secondary | ICD-10-CM

## 2015-03-31 MED ORDER — OMEPRAZOLE 20 MG PO CPDR: 20.0000 mg | DELAYED_RELEASE_CAPSULE | Freq: Every day | ORAL | Status: DC

## 2015-03-31 MED ORDER — ESCITALOPRAM OXALATE 20 MG PO TABS: 20.0000 mg | ORAL_TABLET | Freq: Every day | ORAL | Status: DC

## 2015-03-31 MED ORDER — ALPRAZOLAM 0.5 MG PO TABS: ORAL_TABLET | ORAL | Status: DC

## 2015-03-31 NOTE — Progress Notes (Signed)
   Subjective:    Patient ID: [name], female    DOB: 02-09-1950, 65 y.o.   MRN: [id number]  CC: 3 month f/u for depression and HTN  HPI  [name] is a 65yo woman with PMH of Depression/anxiety, chronic sinusitis, HTN, hip pain who presents for follow up.   For her hip pain, she is taking glucosamine/chondroitin with good results.  She is not interested in further medications today.   She reports blurry vision, using readers but they are no longer working.  She has headaches from squinting.  She thinks she needs glasses.  She has no eye pain, tearing, drainage.  She is worried that glasses cost too much.   She continues to work with her dogs and had met some neighbors who she spends time with.  She has a strained relationship with her daughter.  *(PHQ2 = 0) She has become completely vegan and we discussed taking calcium supplements.  She is interested in a weight loss medication which starts with a C (I believe it is the combo wellbutrin/naltrexone)  She is taking amlodipine without issues.   Review of Systems  Constitutional: Negative for activity change and fatigue.  HENT: Negative for congestion and ear pain.   Eyes: Positive for visual disturbance. Negative for photophobia, pain, discharge and itching.  Respiratory: Negative for cough and shortness of breath.   Cardiovascular: Negative for chest pain and leg swelling.  Musculoskeletal: Positive for back pain and arthralgias.  Neurological: Negative for dizziness and weakness.       Objective:   Physical Exam  Constitutional: She is oriented to person, place, and time. She appears well-developed and well-nourished. No distress.  HENT:  Head: Normocephalic and atraumatic.  Eyes: Conjunctivae are normal. Pupils are equal, round, and reactive to light. No scleral icterus.  20/100 on the left, with blurring; 20/30 on the right  Cardiovascular: Normal rate, regular rhythm and normal heart sounds.   No murmur  heard. Pulmonary/Chest: Effort normal and breath sounds normal. No respiratory distress. She has no wheezes.  Musculoskeletal: She exhibits no edema or tenderness.  Neurological: She is alert and oriented to person, place, and time.  Psychiatric: She has a normal mood and affect. Her behavior is normal.    No labs      Assessment & Plan:  RTC in 1 month to discuss weight loss.

## 2015-03-31 NOTE — Patient Instructions (Signed)
Ms. Huetter - -   Thank you for coming to see me!  Please come back to see me in 1 month to discuss weight loss.  I have appointments on April 5 and April 12  Call with any issues or questions!

## 2015-04-02 NOTE — Assessment & Plan Note (Signed)
Taking supplements with good result and OTC Advil.   Continue current therapy and maintaining exercise by walking dogs.

## 2015-04-02 NOTE — Assessment & Plan Note (Signed)
Doing well on current therapy, close to goal, was rushing to get in.   Continue amlodipine.

## 2015-04-02 NOTE — Assessment & Plan Note (Signed)
>>  ASSESSMENT AND PLAN FOR OSTEOARTHRITIS OF MULTIPLE JOINTS WRITTEN ON 04/02/2015  4:44 PM BY MULLEN, EMILY B, MD  Taking supplements with good result and OTC Advil.   Continue current therapy and maintaining exercise by walking dogs.

## 2015-04-02 NOTE — Assessment & Plan Note (Signed)
Possibly related to eye strain.  She has a recent, gradual worsening in vision and likely needs glasses.   Information provided re: Resources to get inexpensive glasses.

## 2015-04-28 ENCOUNTER — Ambulatory Visit: Payer: Self-pay | Admitting: Internal Medicine

## 2015-04-28 ENCOUNTER — Encounter: Payer: Self-pay | Admitting: Internal Medicine

## 2015-06-18 ENCOUNTER — Other Ambulatory Visit: Payer: Self-pay

## 2015-06-18 DIAGNOSIS — F419 Anxiety disorder, unspecified: Secondary | ICD-10-CM

## 2015-06-18 DIAGNOSIS — F32A Depression, unspecified: Secondary | ICD-10-CM

## 2015-06-18 DIAGNOSIS — F329 Major depressive disorder, single episode, unspecified: Secondary | ICD-10-CM

## 2015-06-18 NOTE — Telephone Encounter (Signed)
Pt requesting escitalopram to be filled @ [facility] on [address].

## 2015-06-18 NOTE — Telephone Encounter (Signed)
[facility] to call and transfer prescription. Patient notified.

## 2015-06-22 ENCOUNTER — Other Ambulatory Visit: Payer: Self-pay

## 2015-06-22 DIAGNOSIS — Z1231 Encounter for screening mammogram for malignant neoplasm of breast: Secondary | ICD-10-CM

## 2015-07-02 ENCOUNTER — Ambulatory Visit: Payer: Self-pay

## 2015-07-09 ENCOUNTER — Ambulatory Visit: Payer: Self-pay

## 2015-07-19 ENCOUNTER — Ambulatory Visit: Payer: Self-pay

## 2015-08-26 ENCOUNTER — Other Ambulatory Visit: Payer: Self-pay | Admitting: Internal Medicine

## 2015-08-26 DIAGNOSIS — F330 Major depressive disorder, recurrent, mild: Secondary | ICD-10-CM

## 2015-08-26 NOTE — Telephone Encounter (Addendum)
Pt needs to be seen, last visit 03/2015 was to rtc in 1 month and was no show I called pt and she states that her insurance told her she had to changes doctors so she has seen a dr at [facility] but she wants to see dr Daryll Drown, she states she will call the insurance co tomorrow and let us know if she can come to [facility] Will await call

## 2015-09-08 ENCOUNTER — Encounter: Payer: Self-pay | Admitting: Internal Medicine

## 2015-09-09 NOTE — Telephone Encounter (Signed)
I have checked the [place].  There are some concerning issues:  [name] has received #120 of Alprazolam from 2 different prescribers: [name] and [name] over the last 4 months.  Although this is not necessarily concerning if she was required to switch providers per her insurance there are other potential red flags.  For instance:  She received #120 of alprazolam on 05/24/15 prescribed by [name] [facility]) She received #120 of alprazolam on 06/23/15 prescribed by Dr. Justin Price (filled at [facility])  She then received #120 of alprazolam on 06/29/15 prescribed by [name] [facility]) meaning that within 1 week she received #240 total, and this was from 2 different pharmacies which would not necessarily raise suspicion from one of the pharmacies if they did not know about the other prescription.  Her next prescription was #120 of alprazolam on 08/27/2015 prescribed by Dr. Justin Price and this would be pretty close to the correct refill date given the #240 received 2 months earlier, thus despite the #240 in early June there is no record of a refill in July.  What is concerning is her request for another refill 13 days after she received #120 from Dr. Justin Price.  She should not require another prescription for alprazolam until 09/26/2015.  As I do not know [name], I am not sure if she is just being proactive with her refills and there is truly nothing concerning here.  Since she is not due I will reject this prescription and allow [name] to decide if a refill is appropriate and when.  Obviously, she should not be "double dipping" and will need to decide where she is getting her primary care so there are not issues with duplication of medications and the associated dangers.

## 2015-09-15 ENCOUNTER — Ambulatory Visit (INDEPENDENT_AMBULATORY_CARE_PROVIDER_SITE_OTHER): Payer: Medicare Other | Admitting: Internal Medicine

## 2015-09-15 ENCOUNTER — Encounter: Payer: Self-pay | Admitting: Internal Medicine

## 2015-09-15 VITALS — BP 133/54 | HR 72 | Temp 98.6°F | Ht 61.0 in | Wt 176.9 lb

## 2015-09-15 DIAGNOSIS — Z596 Low income: Secondary | ICD-10-CM

## 2015-09-15 DIAGNOSIS — K219 Gastro-esophageal reflux disease without esophagitis: Secondary | ICD-10-CM

## 2015-09-15 DIAGNOSIS — I10 Essential (primary) hypertension: Secondary | ICD-10-CM | POA: Diagnosis not present

## 2015-09-15 DIAGNOSIS — E8881 Metabolic syndrome: Secondary | ICD-10-CM | POA: Diagnosis not present

## 2015-09-15 DIAGNOSIS — Z598 Other problems related to housing and economic circumstances: Secondary | ICD-10-CM

## 2015-09-15 DIAGNOSIS — F331 Major depressive disorder, recurrent, moderate: Secondary | ICD-10-CM

## 2015-09-15 DIAGNOSIS — J324 Chronic pansinusitis: Secondary | ICD-10-CM

## 2015-09-15 DIAGNOSIS — K5790 Diverticulosis of intestine, part unspecified, without perforation or abscess without bleeding: Secondary | ICD-10-CM

## 2015-09-15 DIAGNOSIS — Z599 Problem related to housing and economic circumstances, unspecified: Secondary | ICD-10-CM

## 2015-09-15 DIAGNOSIS — J329 Chronic sinusitis, unspecified: Secondary | ICD-10-CM

## 2015-09-15 DIAGNOSIS — F339 Major depressive disorder, recurrent, unspecified: Secondary | ICD-10-CM

## 2015-09-15 LAB — GLUCOSE, CAPILLARY: GLUCOSE-CAPILLARY: 111 mg/dL — AB (ref 65–99)

## 2015-09-15 LAB — POCT GLYCOSYLATED HEMOGLOBIN (HGB A1C): HEMOGLOBIN A1C: 5.2

## 2015-09-15 NOTE — Assessment & Plan Note (Signed)
She has been depressed since I have known her.  She reports isolation, but also tells many anecdotes about her neighbors and an array of friends.  I know she "feels" isolated despite these many people around.  I have had her see a therapist in the past, but she quit going after about a year because she felt it did not help.  She needs to be more active, however, she has limited funds.  She recently had her favorite dog die and this has been very hard for her.  She has not been able to afford vet bills for a while and so she cannot foster another dog.    I am not sure what else I can offer her at this point.  She is on xanax, and I do not think she would tolerate weaning, and lexapro.  She is on max dose.  She has been provided crisis information in the past, however, she is not actively suicidal at this time.    She does report palpitations recently which I think can be attributed to anxiety related to her bills/financial situation.  Will further investigate if persistent/recurrent.    CSW consult as noted above.

## 2015-09-15 NOTE — Assessment & Plan Note (Signed)
BP is well controlled today.  She is taking her amlodipine without issue.    Plan  Continue Amlodipine Check BMET today for renal function.

## 2015-09-15 NOTE — Assessment & Plan Note (Signed)
No acute issues today.  If memory serves, her symptoms are better in the summer.  Continue to monitor.

## 2015-09-15 NOTE — Assessment & Plan Note (Signed)
Symptoms well controlled on omeprazole, continue.

## 2015-09-15 NOTE — Assessment & Plan Note (Signed)
Noted on colonoscopy.  No symptoms today.   Monitor.

## 2015-09-15 NOTE — Assessment & Plan Note (Signed)
She continues to have issues with not enough money.  I am concerned that she is spending her money in ways that do not benefit her including helping friends out, driving friends around and then not having money for food.  I am not clear what motivates this behavior and she does not seem clear either.  She has not lost weight, so I am not very concerned for malnutrition at this time.  Will add on LFTs to look at albumin.    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  She usually has excuses as to why she cannot do these things.  She may benefit from a financial advisor, but I do not know if she could afford office hours for that.    She has agreed to speak with Golden Hurter, our CSW here in the clinic to find out if there is more affordable housing or transportation she can obtain.

## 2015-09-15 NOTE — Patient Instructions (Signed)
[name] - -  Thank you for coming in to see me today.   I am sorry to hear about your dog passing.    I will place a consult for our social worker to call you with assistance programs that may be available.    Please continue to take your medications as prescribed.    Please go to the mammogram when scheduled.    Thank you!

## 2015-09-15 NOTE — Progress Notes (Signed)
Subjective:    Patient ID: [name], female    DOB: [birth year], 65 y.o.   MRN: [id number]  6 month follow up for HTN  HPI  [name] is a 65yo woman with PMH of HTN, chronic sinusitis, GERD, Depression/Anxiety, HLD, headaches who presents for routine follow up.   [name] unfortunately has had one of her greyhounds pass away about 2 weeks ago.  She could not afford to have the dog cremated so she could keep the ashes.    She, as is regular for her, had many complaints about her financial issues with paying for food, paying for gas and having very limited funds.  She tells anecdotes today about feeling like her neighbors "use" her for a taxi service and to help with their expenses.  I attempted to discuss with her about how she should not be using her limited funds on her neighbors, but she responds with "I want to help them."  We discussed again more affordable housing and transportation options.  I will have our social worked [name] contact her.  She states that she feels isolated, but she tells anecdotes about many friends and social events she attends.  She is always well kept and has not been losing weight.  She is resistant to financial assistance and eats a very specific diet (strict vegan) for social reasons.  It is difficult to tell how we may assist this woman as I do not think she has the skills to change her habits (buy less expensive food, avoid spending money on neighbors, etc).  I empathize with her as she has limited communication with her family which does cause a significant amount of stress.  I am hopeful [name] may be able to help get her into more affordable housing and provide some assistance with transportation.  She continues to be passively suicidal in stating things like "once my other dog goes, I will have nothing to live for."  I have attempted to get her in to see psychiatry for further assistance.  She did go see a Social worker and psychiatrist for  a while, but stopped.  I am concerned that she would not have the funds at this time to go see someone new.   As for her medical issues, she is taking her amlodipine and her BP is well controlled today. She does report an episode of waking up with her heart racing and eventually calming down, which she thinks is related to her bills and anxiety about finances.  She is taking her lexapro and xanax and has some improvement with her anxiety.  She reports no chest pain, no SOB with walking or chest pain with walking.  The palpitations seem to be related to anxiety.  If they persist, will plan for EKG and further testing at next visit.  I asked her to call if they come on more often or get worse.    Review of Systems  Constitutional: Negative for activity change and unexpected weight change.  Musculoskeletal: Positive for arthralgias and back pain.  Psychiatric/Behavioral: Positive for dysphoric mood, sleep disturbance and suicidal ideas (passive). Negative for self-injury. The patient is nervous/anxious.        Objective:   Physical Exam  Constitutional: She is oriented to person, place, and time. She appears well-developed and well-nourished. She appears distressed (at times crying).  HENT:  Head: Normocephalic and atraumatic.  Cardiovascular: Normal rate, regular rhythm, normal heart sounds and intact distal pulses.   No  murmur heard. Pulmonary/Chest: Effort normal and breath sounds normal. No respiratory distress. She has no wheezes.  Musculoskeletal: She exhibits no edema.  Neurological: She is alert and oriented to person, place, and time.  Psychiatric: Her mood appears anxious. Her speech is not rapid and/or pressured and not delayed. She is withdrawn. She exhibits a depressed mood.   BMET, A1C today.      Assessment & Plan:  RTC 6 months, sooner if needed.

## 2015-09-15 NOTE — Assessment & Plan Note (Signed)
A1C today checked was 5.2.  LDL at last check was < 100.  She is a vegetarian.  Main issue is high BP which is well controlled with amlodipine.

## 2015-09-16 ENCOUNTER — Telehealth: Payer: Self-pay | Admitting: Licensed Clinical Social Worker

## 2015-09-16 LAB — BMP8+ANION GAP
Anion Gap: 18 mmol/L (ref 10.0–18.0)
BUN/Creatinine Ratio: 21 (ref 12–28)
BUN: 14 mg/dL (ref 8–27)
CO2: 24 mmol/L (ref 18–29)
Calcium: 9.4 mg/dL (ref 8.7–10.3)
Chloride: 102 mmol/L (ref 96–106)
Creatinine, Ser: 0.67 mg/dL (ref 0.57–1.00)
GFR calc Af Amer: 106 mL/min/1.73
GFR calc non Af Amer: 92 mL/min/1.73
Glucose: 105 mg/dL — ABNORMAL HIGH (ref 65–99)
Potassium: 4.3 mmol/L (ref 3.5–5.2)
Sodium: 144 mmol/L (ref 134–144)

## 2015-09-16 NOTE — Telephone Encounter (Signed)
Clinical Social Work Assessment                       Reason for consult:    Financial Assistance                              Housing/Transportation Living arrangements:  Ms. Schnaidt is currently in a lease at an apartment complex.  She spends approx $618/month on rent and has internet access.  Pt states she placed herself on a waiting list for a senior housing complex that allows one dog and rent is based on income.  Ms. Chrobak is hopeful she will be available to move once her lease is up at her current apartment.  CSW offered additional resources for [employer]  Pt declined, stating she is not familiar with all of [place] and would like housing near friends.   Lives with: Pt lives alone with one dog that is fostered.  Significant Relationships:   Ms. Mikulecky speaks of multiple friendships in the community.  Friends that have brought bags of food to her home (which included foods that she states she does not eat) and others that she has felt comfortable enough to ask for financial loans.  Ms. Mcneish has a strained relationship with her daughter and grand-children who live in [place].  Pt has not had contact with them recently, not able to ask daughter for assistance. Ms. Mcroberts's pet dog recently passes away, a friend assisted with financial cremation services but unable to pay for cremation ash retrieval.  Pt morning the loss of her pet and states she continues to live for the one dog she is fostering.    Financial Concerns:  Pt recently received a $200 energy assistance credit with [employer] to help with utility bills. Ms. kalicia wicht her checking account with online shopping, which pt states she must pay $500 due to bills and charges.  Pt declined CSW offer to provide information on Consumer Credit Counseling.  Pt's plan is to no longer utilize online shopping.  Pt continues to drive and has her own car, however voiced concern regarding cost of gas and  upkeep.  CSW discussed SCAT, which pt feels gives up independence.  Ms. Comeau applied for food stamps approx 7 years ago, but states the amount she received provided limited to no assistance.  Pt is agreeable to CSW mailing application.  Ms. Ortego declines to receive food from food pantries or hot meal locations as she prefers to eat particular foods which are not provided a local pantry locations.  Pt declined information on [employer] [facility] [place] Nutrition program as she does not want others to know she is in need of nutrition assistance.  Ms. Killelea declined information on [place] which provides $50 assistance, stating $50 would not offer much help.  Social Worker assessment / plan:    Alcohol / Substance use: none Psych involvement (Current and /or in the community): Pt declines referral to behavioral health services, even if agency payment is based on sliding fee scale.  CSW discussed the benefits of counseling/therapy services for behavior modification.  Ms. Daponte states her prior experience is that it was just "a job" to the counselor and she was only allotted 40 min.  Pt states she did not benefit and declines referral information.  CSW offered active listening and pt was able to laugh/joke by the end of  this phone call, which was reassuring.  Employment status:  Pt is not employed, retired and states she would be unable to work due to inability to stand for long length of time and difficulty remembering things.  Pt used to volunteer at the [employer] but states her friends no longer are able to volunteer there and she is not interested and volunteering anymore.  Ms. Jarnagin prefers not to be around people with whom she is unfamiliar and is comfortable staying in her apartment.  Insurance information:  [facility] Medicare    Information / Referral to community resources:  Ms. Rueter agreeable to have CSW mail community resources for her to  decide which may be the best fit.  Resources will include: Metallurgist, [place], Scientific laboratory technician Housing attempting to remain within pt's zip code and [employer].  Barriers to Care:  Ms. Cottle lacks motivation to make any significant financial or lifestyle change at this time.

## 2015-09-17 ENCOUNTER — Encounter: Payer: Self-pay | Admitting: Licensed Clinical Social Worker

## 2015-09-17 LAB — HEPATIC FUNCTION PANEL
ALBUMIN: 4 g/dL (ref 3.6–4.8)
ALK PHOS: 132 IU/L — AB (ref 39–117)
ALT: 15 IU/L (ref 0–32)
AST: 14 IU/L (ref 0–40)
Bilirubin, Direct: 0.03 mg/dL (ref 0.00–0.40)
Total Protein: 6.7 g/dL (ref 6.0–8.5)

## 2015-09-17 LAB — SPECIMEN STATUS REPORT

## 2015-10-28 ENCOUNTER — Other Ambulatory Visit: Payer: Self-pay | Admitting: Internal Medicine

## 2015-11-12 ENCOUNTER — Encounter (HOSPITAL_COMMUNITY): Payer: Self-pay | Admitting: *Deleted

## 2015-11-12 ENCOUNTER — Telehealth: Payer: Self-pay

## 2015-11-12 ENCOUNTER — Ambulatory Visit (HOSPITAL_COMMUNITY)
Admission: EM | Admit: 2015-11-12 | Discharge: 2015-11-12 | Disposition: A | Discharge status: Home / Self Care | Payer: Medicare Other | Attending: Family Medicine | Admitting: Family Medicine

## 2015-11-12 DIAGNOSIS — R0789 Other chest pain: Secondary | ICD-10-CM

## 2015-11-12 NOTE — ED Provider Notes (Signed)
[place]    CSN: [id number] Arrival date & time: 11/12/15  1437     History   Chief Complaint Chief Complaint  Patient presents with  . Chest Pain    HPI [name] is a 65 y.o. female.   The history is provided by the patient.  Chest Pain  Pain location:  L lateral chest Pain quality: sharp   Pain radiates to:  Neck Pain severity:  Mild Onset quality:  Gradual Progression:  Resolved Chronicity:  New Context: movement   Relieved by:  Nothing Worsened by:  Nothing Ineffective treatments:  None tried Associated symptoms: no dizziness, no fever, no lower extremity edema, no palpitations, no PND, no shortness of breath, no vomiting and no weakness     Past Medical History:  Diagnosis Date  . Anxiety   . Arthritis   . Depression   . GERD (gastroesophageal reflux disease)   . PONV (postoperative nausea and vomiting)     Patient Active Problem List   Diagnosis Date Noted  . Osteoarthritis of multiple joints 07/01/2014  . GERD (gastroesophageal reflux disease) 05/28/2014  . Pre-diabetes 12/17/2013  . Chronic sinusitis 04/30/2013  . Anxiety state 02/19/2013  . Major depressive disorder, recurrent episode ([place]) 04/22/2012  . Financial difficulties 02/19/2012  . Routine adult health maintenance 11/06/2011  . Vegetarian diet 08/18/2011  . Diverticulosis 04/19/2011  . Metabolic syndrome [id number]  . Hyperlipidemia 02/20/2008  . HYPERTENSION, BENIGN ESSENTIAL 10/23/2007  . Chronic daily headache 03/28/2006  . Insomnia 03/28/2006    Past Surgical History:  Procedure Laterality Date  . BUNIONECTOMY     bilateral  . FRONTAL SINUS EXPLORATION Left 09/15/2014   Procedure: LEFT ENDOSCOPIC FRONTAL RECESS  EXPLORATION WITH FUSION IMAGE GUIDANCE NAVIGATION;  Surgeon: Leta Baptist, MD;  Location: [place];  Service: ENT;  Laterality: Left;  . SEPTOPLASTY Bilateral 10/20/2013   Procedure: SEPTOPLASTY;  Surgeon: Ascencion Dike, MD;   Location: [place];  Service: ENT;  Laterality: Bilateral;  . SINUS ENDO W/FUSION Bilateral 10/20/2013   Procedure: ENDOSCOPIC SINUS SURGERY WITH FUSION NAVIGATION, BILATERAL MAXILLARY ANTROSTOMIES, BILATERAL ETHMOIDECTOMIES, BILATERAL SPHENOIDECTOMIES, BILATERAL FRONTAL RECESS EXPLORATION;  Surgeon: Ascencion Dike, MD;  Location: [place];  Service: ENT;  Laterality: Bilateral;  . SINUS ENDO WITH FUSION Left 09/15/2014   Procedure: LEFT ENDOSCOPIC TOTAL ETHMOIDECTOMY WITH FUSION IMAGE GUIDANCE NAVIGATION;  Surgeon: Leta Baptist, MD;  Location: [place];  Service: ENT;  Laterality: Left;  . SPHENOIDECTOMY Right 09/15/2014   Procedure: RIGHT ENDOSCOPIC SPHENOIDECTOMY WITH FUSION IMAGE GUIDANCE NAVIGATION;  Surgeon: Leta Baptist, MD;  Location: [place];  Service: ENT;  Laterality: Right;    OB History    No data available       Home Medications    Prior to Admission medications   Medication Sig Start Date End Date Taking? Authorizing Provider  ALPRAZolam Duanne Moron) 0.5 MG tablet Take 1 tablet by mouth twice daily as needed and take 1-2 tablets at bedtime as needed for anxiety and sleep 03/31/15   Sid Falcon, MD  amLODipine (NORVASC) 2.5 MG tablet Take 1 tablet by mouth  daily 03/12/15   Sid Falcon, MD  DIPHENHYDRAMINE HCL, SLEEP, PO Take 25 mg by mouth at bedtime as needed.    Historical Provider, MD  escitalopram (LEXAPRO) 20 MG tablet Take 1 tablet (20 mg total) by mouth daily. 03/31/15 03/30/16  Sid Falcon, MD  omeprazole (PRILOSEC) 20 MG capsule Take  1 capsule (20 mg total) by mouth daily. 03/31/15   Sid Falcon, MD    Family History Family History  Problem Relation Age of Onset  . Cancer Mother 32    Lung  . Parkinsonism Father   . Drug abuse Brother   . Depression Brother     Social History Social History  Substance Use Topics  . Smoking status: Never Smoker  . Smokeless tobacco: Not on file  . Alcohol use No      Allergies   Penicillins; Cephalexin; and Lamictal [lamotrigine]   Review of Systems Review of Systems  Constitutional: Negative.  Negative for fever.  HENT: Negative.   Respiratory: Negative for chest tightness, shortness of breath and wheezing.   Cardiovascular: Positive for chest pain. Negative for palpitations, leg swelling and PND.  Gastrointestinal: Negative for vomiting.  Neurological: Negative for dizziness and weakness.  All other systems reviewed and are negative.    Physical Exam Triage Vital Signs ED Triage Vitals [11/12/15 1454]  Enc Vitals Group     BP 109/73     Pulse Rate 107     Resp 16     Temp 98 F (36.7 C)     Temp Source Oral     SpO2 98 %     Weight      Height      Head Circumference      Peak Flow      Pain Score      Pain Loc      Pain Edu?      Excl. in [place]?    No data found.   Updated Vital Signs BP 109/73 (BP Location: Right Arm)   Pulse 107   Temp 98 F (36.7 C) (Oral)   Resp 16   SpO2 98%   Visual Acuity Right Eye Distance:   Left Eye Distance:   Bilateral Distance:    Right Eye Near:   Left Eye Near:    Bilateral Near:     Physical Exam  Constitutional: She appears well-developed and well-nourished.  Neck: Normal range of motion. Neck supple.  Cardiovascular: Normal rate, regular rhythm, normal heart sounds and intact distal pulses.   Pulmonary/Chest: Effort normal and breath sounds normal. She exhibits tenderness.  Left upper palp soreness., none at this time.  Lymphadenopathy:    She has no cervical adenopathy.  Nursing note and vitals reviewed.    UC Treatments / Results  Labs (all labs ordered are listed, but only abnormal results are displayed) Labs Reviewed - No data to display  EKG ED ECG REPORT   Date: 11/12/2015  Rate: 94  Rhythm: normal sinus rhythm  QRS Axis: normal  Intervals: normal  ST/T Wave abnormalities: normal  Conduction Disutrbances:none  Narrative Interpretation:   Old EKG  Reviewed: none available  I have personally reviewed the EKG tracing and agree with the computerized printout as noted.   Radiology No results found.  Procedures Procedures (including critical care time)  Medications Ordered in UC Medications - No data to display   Initial Impression / Assessment and Plan / UC Course  I have reviewed the triage vital signs and the nursing notes.  Pertinent labs & imaging results that were available during my care of the patient were reviewed by me and considered in my medical decision making (see chart for details).  Clinical Course      Final Clinical Impressions(s) / UC Diagnoses   Final diagnoses:  Atypical chest pain  Chest wall  pain    New Prescriptions New Prescriptions   No medications on file     Billy Fischer, MD 11/12/15 1520

## 2015-11-12 NOTE — ED Triage Notes (Signed)
Pt  Reports  Had  Chest  Pain this  Am   With  Pain in  Her  Neck   No  Pain  At this  Time

## 2015-12-01 ENCOUNTER — Encounter: Payer: Self-pay | Admitting: Internal Medicine

## 2016-02-21 IMAGING — CT CT MAXILLOFACIAL W/O CM
3 series · 16 of 47 positions shown, 19 images · non-contrast
Comparison: None available.

CLINICAL DATA: Sinusitis.  Congestion.  Right ear fullness.

EXAM:
CT MAXILLOFACIAL WITHOUT CONTRAST
TECHNIQUE: Multidetector CT imaging of the maxillofacial structures was
performed. Multiplanar CT image reconstructions were also generated.
A small metallic BB was placed on the right temple in order to
reliably differentiate right from left.

[Series 3: axial soft 1.25 · axial · 0.49mm/px · z∈[-30,+78]mm · 10 of 101 slices shown, 13 images]
[im 7/101  brain]
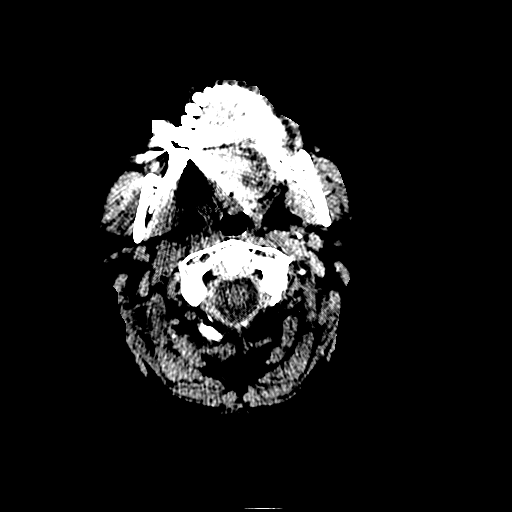
[im 7/101  bone]
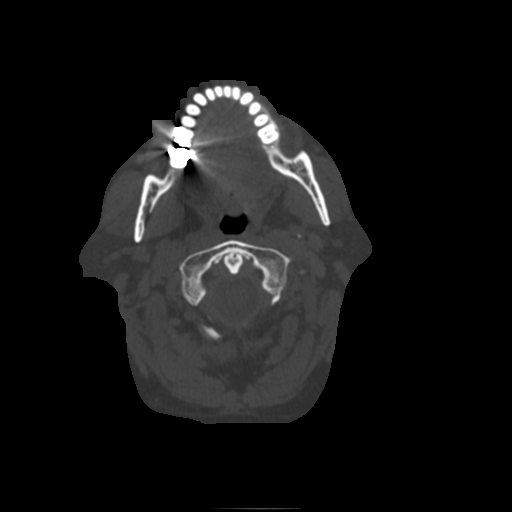
[im 18/101  bone]
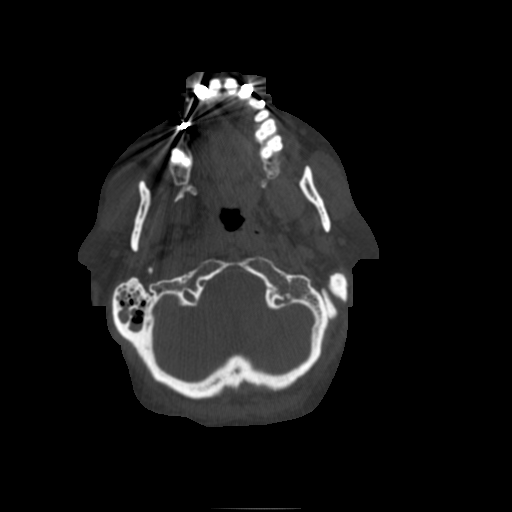
[im 28/101  bone]
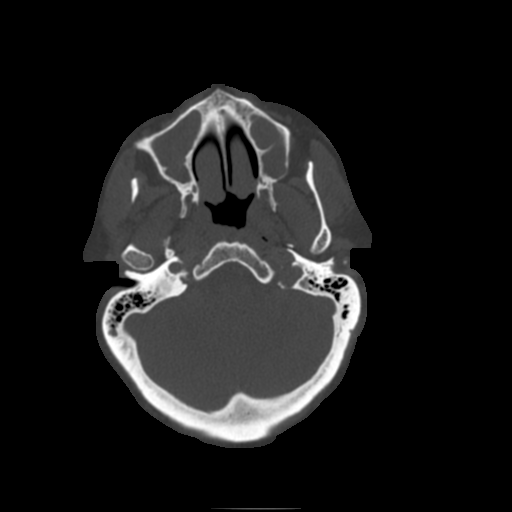
[im 35/101  bone]
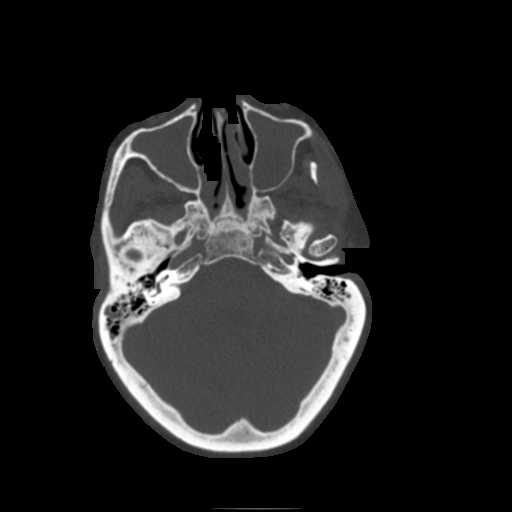
[im 45/101  brain]
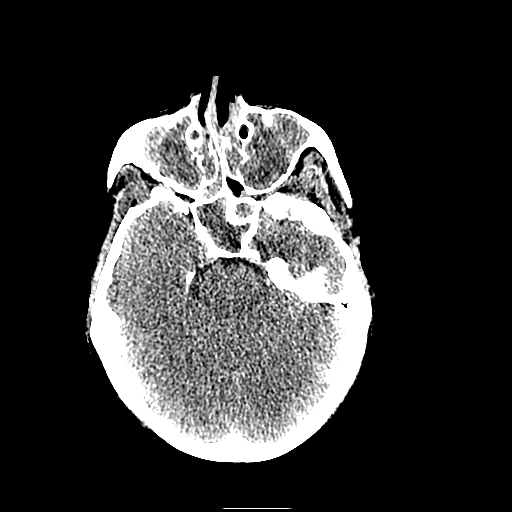
[im 45/101  bone]
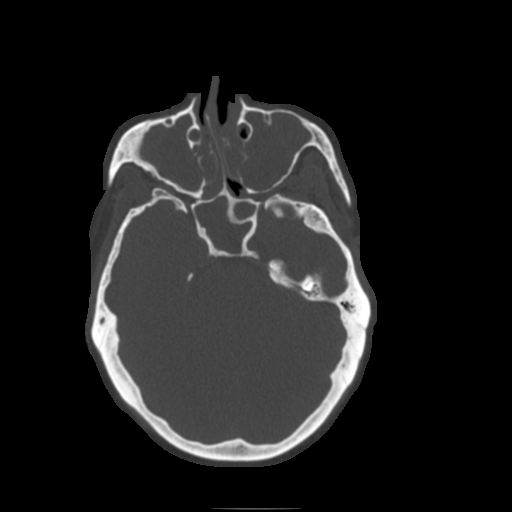
[im 56/101  bone]
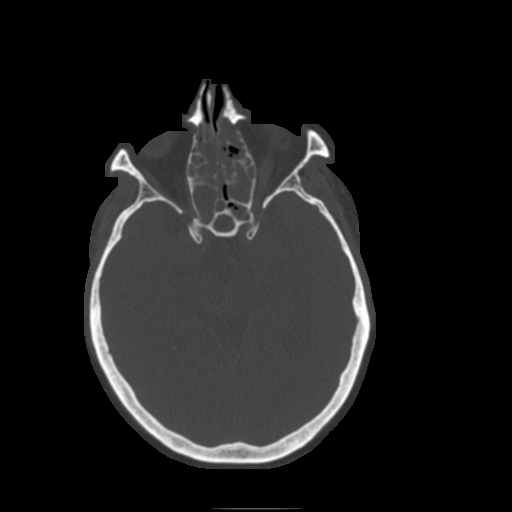
[im 66/101  bone]
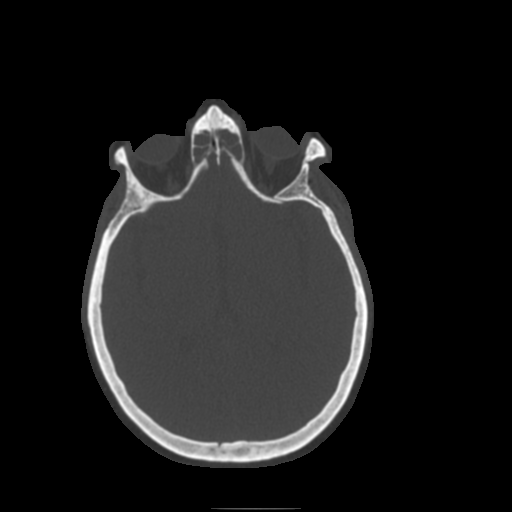
[im 76/101  bone]
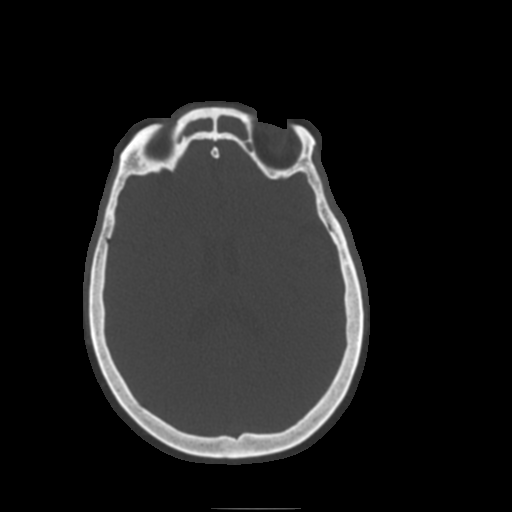
[im 83/101  brain]
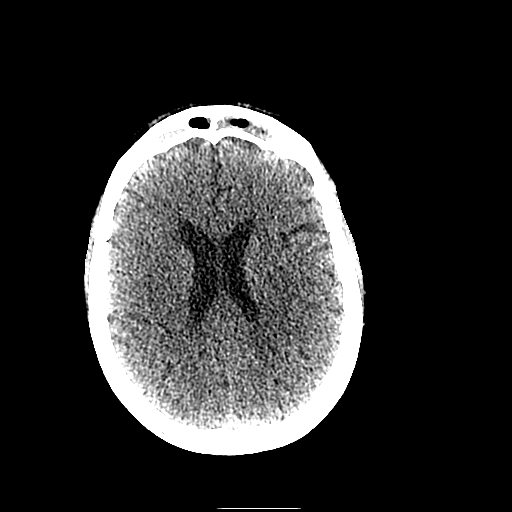
[im 83/101  bone]
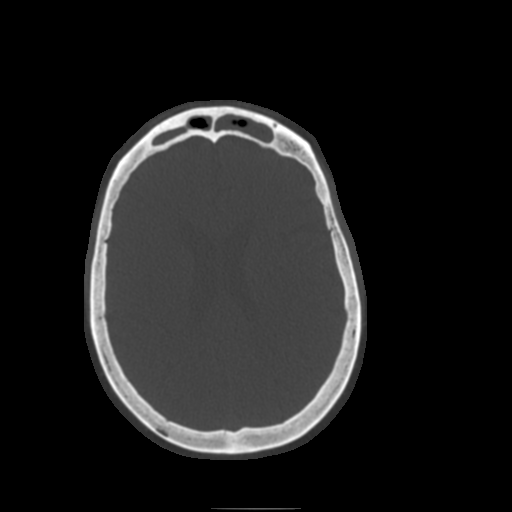
[im 94/101  bone]
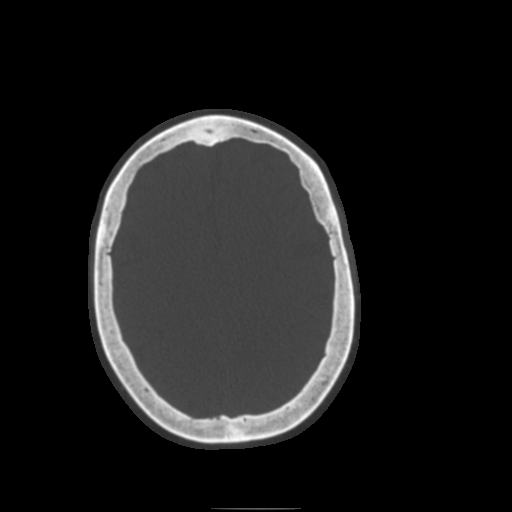

[Series 103: sag · sagittal · 0.49mm/px · 3 of 93 slices shown]
[im 31/93  bone]
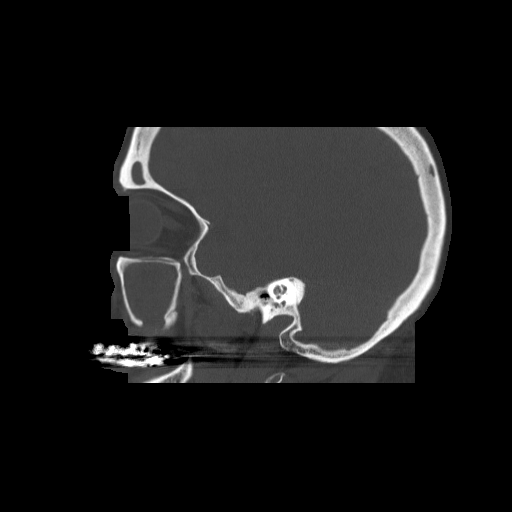
[im 47/93  bone]
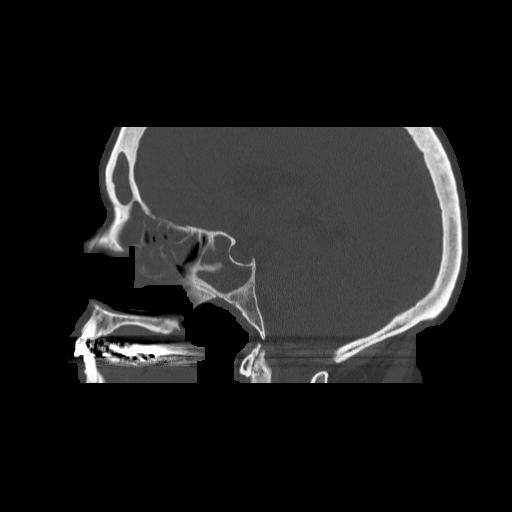
[im 62/93  bone]
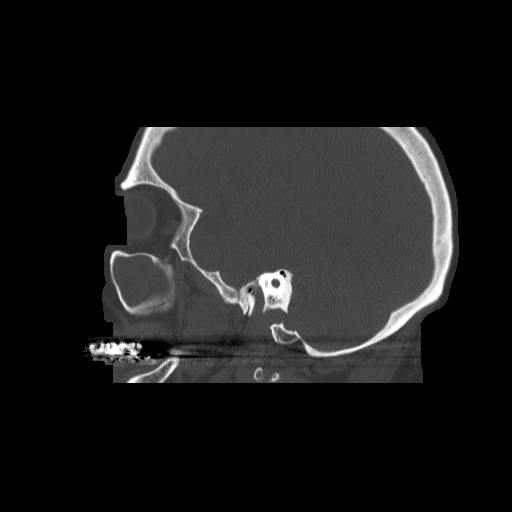

[Series 104: cor · coronal · 0.49mm/px · 3 of 113 slices shown]
[im 38/113  bone]
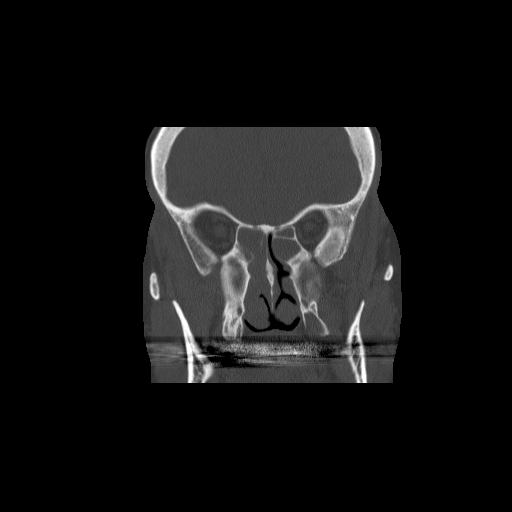
[im 50/113  bone]
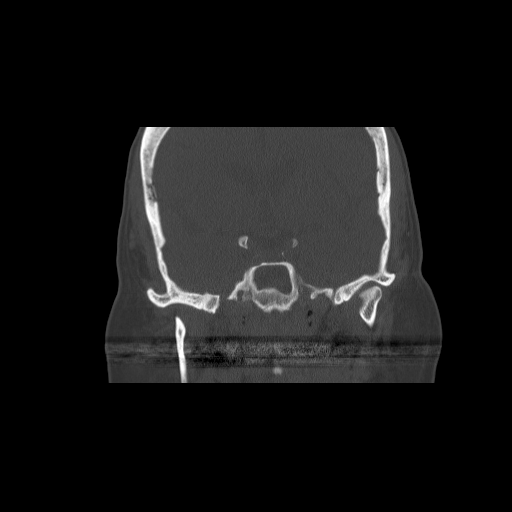
[im 63/113  bone]
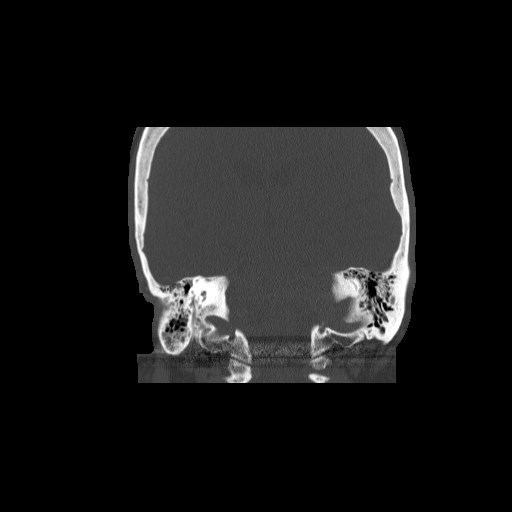

[16 of 47 positions shown; findings below may reference images not displayed]

FINDINGS: The paranasal sinuses are near totally opacified bilaterally. There
is minimal aeration within the left posterior ethmoid air cells. The
nasal cavity is opacified suggesting diffuse polyposis. An osseous
defect is present along the inferolateral aspect of the right
maxillary sinus which may represent Tajemniczy Nosal procedure.
Periapical lucency is noted about the most posterior right maxillary
molar.

This may communicate into sinus. Limited imaging of the brain is
unremarkable.
IMPRESSION: 1. Near total opacification of the paranasal sinuses with some
sparing in the posterior ethmoids.
2. Question Curiel procedure on the right.
3. Diffuse nasal opacification is well. This may represent sinonasal
polyposis. Recommend correlation with direct examination.
4. Right posterior maxillary dental disease.

## 2016-03-01 ENCOUNTER — Encounter: Payer: Self-pay | Admitting: Internal Medicine

## 2016-03-01 ENCOUNTER — Ambulatory Visit (INDEPENDENT_AMBULATORY_CARE_PROVIDER_SITE_OTHER): Payer: Medicare Other | Admitting: Internal Medicine

## 2016-03-01 VITALS — BP 149/90 | HR 86 | Temp 98.0°F | Ht 61.0 in | Wt 177.8 lb

## 2016-03-01 DIAGNOSIS — I10 Essential (primary) hypertension: Secondary | ICD-10-CM

## 2016-03-01 DIAGNOSIS — F330 Major depressive disorder, recurrent, mild: Secondary | ICD-10-CM

## 2016-03-01 DIAGNOSIS — F411 Generalized anxiety disorder: Secondary | ICD-10-CM

## 2016-03-01 DIAGNOSIS — Z79899 Other long term (current) drug therapy: Secondary | ICD-10-CM

## 2016-03-01 DIAGNOSIS — F3341 Major depressive disorder, recurrent, in partial remission: Secondary | ICD-10-CM

## 2016-03-01 DIAGNOSIS — Z713 Dietary counseling and surveillance: Secondary | ICD-10-CM

## 2016-03-01 DIAGNOSIS — Z789 Other specified health status: Secondary | ICD-10-CM

## 2016-03-01 DIAGNOSIS — F419 Anxiety disorder, unspecified: Secondary | ICD-10-CM

## 2016-03-01 DIAGNOSIS — Z598 Other problems related to housing and economic circumstances: Secondary | ICD-10-CM | POA: Diagnosis not present

## 2016-03-01 DIAGNOSIS — Z6833 Body mass index (BMI) 33.0-33.9, adult: Secondary | ICD-10-CM

## 2016-03-01 DIAGNOSIS — K219 Gastro-esophageal reflux disease without esophagitis: Secondary | ICD-10-CM

## 2016-03-01 DIAGNOSIS — Z599 Problem related to housing and economic circumstances, unspecified: Secondary | ICD-10-CM

## 2016-03-01 MED ORDER — ALPRAZOLAM 0.5 MG PO TABS: ORAL_TABLET | ORAL | 0 refills | Status: DC

## 2016-03-01 MED ORDER — ESCITALOPRAM OXALATE 20 MG PO TABS: 20.0000 mg | ORAL_TABLET | Freq: Every day | ORAL | 2 refills | Status: DC

## 2016-03-01 MED ORDER — AMLODIPINE BESYLATE 2.5 MG PO TABS: 2.5000 mg | ORAL_TABLET | Freq: Every day | ORAL | 3 refills | Status: DC

## 2016-03-01 MED ORDER — OMEPRAZOLE 20 MG PO CPDR: 20.0000 mg | DELAYED_RELEASE_CAPSULE | Freq: Every day | ORAL | 2 refills | Status: DC

## 2016-03-01 NOTE — Progress Notes (Signed)
   Subjective:    Patient ID: [name], female    DOB: Mar 18, 1950, 66 y.o.   MRN: [id number]  Cc: 6 month follow up for HTN and anxiety/depression  HPI  [name] is a 66yo woman with PMH of HTN, anxiety/depressin, insomnia, GERD, HLD, OA who presents for routine follow up.   Today, [name] reports that she is moving to more affordable housing.  The move is causing her stress, but she is also looking forward to having improved her finances.  She is requesting a letter stating that her dog provides her emotional support and I have provided this letter today.  Her care of animals is one of her main stress relievers.  She has severe anxiety and depression at times and her dogs provide her great comfort.  I think having her dog move with her is appropriate.  Though, letters of this sort are usually provided by a mental health provider, she will no longer see one such provider and I am therefore the appropriate person.   Her BP was mildly high today.  Outlier from previous. She reports taking her medication this morning.  Recheck at next visit.   She continues to have stress related to her fractured relationship with her daughter.  However, she has reconnected with her brother and sister in law.  She has friends helping her move.    She reports increased epigastric discomfort with eating which is better with alka seltzer.  I advised her to increase her omeprazole to twice per day and advised to take it at the appropriate times.  She reports only eating every third day because of running out of food.  I advised her that this move with help with her financial situation. She continues to be well groomed, well dressed and is not losing weight.     Review of Systems  Constitutional: Negative for activity change and appetite change.  Respiratory: Negative for cough, choking and shortness of breath.   Cardiovascular: Negative for chest pain.  Gastrointestinal: Positive for abdominal  pain and diarrhea (after not eating).  Musculoskeletal: Positive for arthralgias and back pain.  Neurological: Negative for dizziness and light-headedness.  Psychiatric/Behavioral: Positive for decreased concentration, dysphoric mood and sleep disturbance. The patient is nervous/anxious.        Objective:   Physical Exam  Constitutional: She is oriented to person, place, and time. She appears well-developed and well-nourished. No distress.  HENT:  Head: Normocephalic and atraumatic.  Cardiovascular: Normal rate, regular rhythm and normal heart sounds.   No murmur heard. Pulmonary/Chest: Effort normal and breath sounds normal. No respiratory distress. She has no wheezes.  Abdominal: Soft. Bowel sounds are normal. She exhibits no distension and no mass. There is no tenderness.  Musculoskeletal: She exhibits no edema.  Neurological: She is alert and oriented to person, place, and time.  Psychiatric: Her mood appears anxious. Her affect is not labile and not inappropriate.    Vitamin B12, Vitamin D, Bmet, CBC today.       Assessment & Plan:  RTC in 3 months

## 2016-03-01 NOTE — Assessment & Plan Note (Signed)
She has severe anxiety surrounding her family situation and financial situation.  She again noted how her daughter would not speak to her.  I continue to advise her to see counseling.  She is moving in to a new home which will be less expensive.  I think this will help her financial situation greatly.  She is on daily xanax and lexapro.  She has been on xanax for a long while and has not tolerated weaning.  She will continue on these medications.  She takes great pleasure in caring for her dog, Nittany.  She reports needing a letter stating that that animal is an emotional support to her for the move and I did provide this as I believe that her connection with her dogs does decrease her anxiety and stress.   Plan Continue xanax and lexapro.

## 2016-03-01 NOTE — Assessment & Plan Note (Addendum)
Check B12 and Vitamin D today.  Check CBC for MCV evaluation.   UPDATE: Vitamin D mildly low.  Will advise [name] to take 800 IU of Vitamin D a day.

## 2016-03-01 NOTE — Assessment & Plan Note (Signed)
[name] had an elevated BP today.  This is an outlier from previous assessment.  She is regularly taking her amlodipine without issue.    Plan Continue amlodipine Check BMET today.

## 2016-03-01 NOTE — Patient Instructions (Signed)
[name] - -   It was a pleasure to see you today.  Please keep taking your amlodipine as prescribed.    For your omeprazole, please increase to twice a day until this episode of pain resolves.  Please try and keep a food diary to see if there are any specific foods that may worsen your symptoms.  Please also make sure you are taking the medication 30 minutes before you eat.    For your dog, I have provided you a letter to have the dog move with you.  This should only serve to allow you to keep your dog home with you.  You should still practice safe, responsible dog ownership, as I know you will.   Thank you!

## 2016-03-01 NOTE — Assessment & Plan Note (Signed)
She is taking omeprazole regularly, but had a worsening of epigastric pain in the last week.  Alka-seltzer helped.  I advised her to try taking omeprazole BID and reminded her the appropriate time to take it.   Consider consult for imaging if she does not improve.   Plan:  Continue omeprazole.

## 2016-03-01 NOTE — Assessment & Plan Note (Signed)
She is moving to help offset some of her expenses. She does not like taking "handouts" so has not accepted any offer of help from Social Work in the past.  Continue to monitor and offer support.

## 2016-03-01 NOTE — Progress Notes (Signed)
Xanax rx called to [facility] per pt's request.

## 2016-03-02 LAB — BMP8+ANION GAP
ANION GAP: 19 mmol/L — AB (ref 10.0–18.0)
BUN/Creatinine Ratio: 11 — ABNORMAL LOW (ref 12–28)
BUN: 9 mg/dL (ref 8–27)
CALCIUM: 9.8 mg/dL (ref 8.7–10.3)
CO2: 26 mmol/L (ref 18–29)
CREATININE: 0.85 mg/dL (ref 0.57–1.00)
Chloride: 96 mmol/L (ref 96–106)
GFR calc Af Amer: 82 mL/min/{1.73_m2} (ref >59)
GFR, EST NON AFRICAN AMERICAN: 71 mL/min/{1.73_m2} (ref >59)
Glucose: 94 mg/dL (ref 65–99)
Potassium: 3.7 mmol/L (ref 3.5–5.2)
Sodium: 141 mmol/L (ref 134–144)

## 2016-03-02 LAB — VITAMIN B12: Vitamin B-12: 397 pg/mL (ref 232–1245)

## 2016-03-02 LAB — CBC
HEMATOCRIT: 36.1 % (ref 34.0–46.6)
Hemoglobin: 11.2 g/dL (ref 11.1–15.9)
MCH: 26.2 pg — ABNORMAL LOW (ref 26.6–33.0)
MCHC: 31 g/dL — ABNORMAL LOW (ref 31.5–35.7)
MCV: 85 fL (ref 79–97)
Platelets: 160 10*3/uL (ref 150–379)
RBC: 4.27 x10E6/uL (ref 3.77–5.28)
RDW: 15.6 % — AB (ref 12.3–15.4)
WBC: 7.8 10*3/uL (ref 3.4–10.8)

## 2016-03-02 LAB — VITAMIN D 25 HYDROXY (VIT D DEFICIENCY, FRACTURES): Vit D, 25-Hydroxy: 24.5 ng/mL — ABNORMAL LOW (ref 30.0–100.0)

## 2016-03-08 ENCOUNTER — Telehealth: Payer: Self-pay | Admitting: *Deleted

## 2016-03-08 NOTE — Telephone Encounter (Signed)
Pt called / informed to start taking Vitamin D 800 international units per day. She can get this over the counter. She should start taking it once she has more available income. " per Dr Daryll Drown. Stated she had seen results in My Chart. Voiced understanding.

## 2016-03-08 NOTE — Telephone Encounter (Signed)
-----   Message from Sid Falcon, MD sent at 03/08/2016  2:14 PM EST ----- Regarding: Vitamin D [name] - - Can you please call patient and have her start taking   Vitamin D 800 international units per day.  She can get this over the counter.  She should start taking it once she has more available income.    Thanks!

## 2016-04-10 ENCOUNTER — Other Ambulatory Visit: Payer: Self-pay | Admitting: Internal Medicine

## 2016-04-10 ENCOUNTER — Telehealth: Payer: Self-pay

## 2016-04-10 DIAGNOSIS — F330 Major depressive disorder, recurrent, mild: Secondary | ICD-10-CM

## 2016-04-10 NOTE — Telephone Encounter (Signed)
[facility]

## 2016-04-10 NOTE — Telephone Encounter (Signed)
Needs [facility] appt PCP Read Dr Doristine Section notes  Will refil to get pt to [facility] Pls phone in

## 2016-04-12 NOTE — Telephone Encounter (Signed)
I filled this 4/19 and asked that it be called in

## 2016-05-31 ENCOUNTER — Ambulatory Visit: Payer: Self-pay | Admitting: Internal Medicine

## 2016-06-07 ENCOUNTER — Encounter: Payer: Self-pay | Admitting: Internal Medicine

## 2016-07-05 ENCOUNTER — Encounter: Payer: Self-pay | Admitting: Internal Medicine

## 2016-07-05 ENCOUNTER — Ambulatory Visit (INDEPENDENT_AMBULATORY_CARE_PROVIDER_SITE_OTHER): Payer: Medicare Other | Admitting: Internal Medicine

## 2016-07-05 VITALS — BP 135/72 | HR 74 | Temp 97.5°F | Resp 18 | Wt 181.1 lb

## 2016-07-05 DIAGNOSIS — H538 Other visual disturbances: Secondary | ICD-10-CM | POA: Diagnosis not present

## 2016-07-05 DIAGNOSIS — Z79899 Other long term (current) drug therapy: Secondary | ICD-10-CM | POA: Diagnosis not present

## 2016-07-05 DIAGNOSIS — Z818 Family history of other mental and behavioral disorders: Secondary | ICD-10-CM | POA: Diagnosis not present

## 2016-07-05 DIAGNOSIS — K219 Gastro-esophageal reflux disease without esophagitis: Secondary | ICD-10-CM | POA: Diagnosis not present

## 2016-07-05 DIAGNOSIS — Z789 Other specified health status: Secondary | ICD-10-CM

## 2016-07-05 DIAGNOSIS — Z813 Family history of other psychoactive substance abuse and dependence: Secondary | ICD-10-CM

## 2016-07-05 DIAGNOSIS — F329 Major depressive disorder, single episode, unspecified: Secondary | ICD-10-CM | POA: Diagnosis not present

## 2016-07-05 DIAGNOSIS — F419 Anxiety disorder, unspecified: Secondary | ICD-10-CM | POA: Diagnosis not present

## 2016-07-05 DIAGNOSIS — I10 Essential (primary) hypertension: Secondary | ICD-10-CM | POA: Diagnosis not present

## 2016-07-05 DIAGNOSIS — F331 Major depressive disorder, recurrent, moderate: Secondary | ICD-10-CM

## 2016-07-05 MED ORDER — DULOXETINE HCL 30 MG PO CPEP: 30.0000 mg | ORAL_CAPSULE | Freq: Every day | ORAL | 3 refills | Status: DC

## 2016-07-05 NOTE — Assessment & Plan Note (Signed)
Well controlled, no acute issue today.   Plan Continue omeprazole Consider trial off of the medication at next visit.

## 2016-07-05 NOTE — Patient Instructions (Addendum)
Ms. Perlman - -   For your antidepressant, please do the following  DECREASE your lexapro to 10mg  daily for 1 week then stop.   START Duloxetine (cymbalta) 30mg  once daily once you have stopped the lexapro.   Call me with any issues or side effects you might develop.   Come back to see me in 1 month.   Please make appointment on 08/02/16 for a [place] appointment.  Thank you!

## 2016-07-05 NOTE — Progress Notes (Addendum)
   Subjective:    Patient ID: [name], female    DOB: [birth year], 66 y.o.   MRN: [id number]  CC: 4 month follow up for anxiety/depression  HPI   [name] is a 66yo woman with pMH of HTN, Anxiety/depression, GERD who presents for routine follow up.   The main issue for [name] today is her depression and lack of interest in normal activities.  She has been on lexapro for a long while.  She has a persistent life stressor which is estrangement from her daughter, which causes her daily stress.  She notes that this relationship has further deteriorated and she is worried that if her dog dies she will "have no reason to get out of bed."  She remains passively suicidal, which has been an issue since we started our therapeutic relationship.  She has moved into a less expensive situation which has helped her financial stress.  She does not particularly like her new living situation, but has made friends and has social outlets.  I think she is more active than she gives herself credit for as she described to me the weekly craft, bingo and movies that are offered at her current apartment complex.    She reports hazy vision which has been ongoing for a few months and getting worse.  She has no change in peripheral vision, eye pain, change in color vision, injection, itching, discharge of the eye.    She does not complain of reflux symptoms today.  She reports taking her medication.      Review of Systems  Constitutional: Positive for activity change. Negative for appetite change, chills and fever.  Respiratory: Negative for cough and shortness of breath.   Cardiovascular: Negative for chest pain.  Psychiatric/Behavioral: Positive for dysphoric mood and sleep disturbance. Negative for suicidal ideas. The patient is nervous/anxious.        Objective:   Physical Exam  Constitutional: She is oriented to person, place, and time. She appears well-developed and well-nourished.    Cardiovascular: Normal rate, regular rhythm and normal heart sounds.   No murmur heard. Pulmonary/Chest: Effort normal and breath sounds normal. No respiratory distress. She has no wheezes.  Musculoskeletal: She exhibits no edema.  Neurological: She is alert and oriented to person, place, and time.  Psychiatric: Her affect is not angry, not blunt and not inappropriate. Her speech is not rapid and/or pressured. She is not agitated and not withdrawn. Thought content is not paranoid and not delusional. She does not express impulsivity or inappropriate judgment. She exhibits a depressed mood. She expresses no homicidal and no suicidal ideation.    No labs today    Assessment & Plan:  RTC in 1 month.   Hazy vision Refer to ophthalmology.  DDx includes cataract, glaucoma.  She needs age appropriate screening for glaucoma at this time.

## 2016-07-05 NOTE — Assessment & Plan Note (Signed)
BP is well controlled today.  She has no complaints of headache.  She does have some hazy vision which I think is a cataract.    Plan Continue low dose amlodipine.

## 2016-07-05 NOTE — Assessment & Plan Note (Signed)
This was our main discussion point today.  She has been on lexapro for many years, not sure if it is working.  Much of her symptoms can be attributed to current life state, unsteady financial state and chronic stressors of estrangement from her family. I have advised her to go to counseling before.  On discussion, she does seem to have good social outlets and is always well kept and clean when I see her.  I am concerned about what will happen when her foster animal dies (68 years old) as this is something for which she gets out of bed.  We will do a trial of changing her antidepressant today.   Plan Wean lexapro to off Start cymbalta (duloxetine) 30 mg daily Follow up in 1 month.

## 2016-07-05 NOTE — Assessment & Plan Note (Signed)
She asked about a ketogenic diet, which I did not recommend at this time.

## 2016-07-06 ENCOUNTER — Telehealth: Payer: Self-pay | Admitting: Internal Medicine

## 2016-07-06 NOTE — Telephone Encounter (Signed)
Please call patient back about her medications.  Please call her back.

## 2016-07-07 NOTE — Telephone Encounter (Signed)
Returned patient's call she wanted to know what diphenhydramine was explained that it is Benadryl she stated that she had forgotten that. Discussed appointment will be scheduled with Dr. Katy Fitch for her eye exam. She denies further questions.

## 2016-07-10 ENCOUNTER — Other Ambulatory Visit: Payer: Self-pay | Admitting: Internal Medicine

## 2016-07-10 ENCOUNTER — Telehealth: Payer: Self-pay

## 2016-07-10 DIAGNOSIS — F330 Major depressive disorder, recurrent, mild: Secondary | ICD-10-CM

## 2016-07-10 NOTE — Telephone Encounter (Signed)
Called to pharm 

## 2016-07-10 NOTE — Telephone Encounter (Signed)
Alprazolam called in to pharmacy

## 2016-07-10 NOTE — Telephone Encounter (Signed)
ALPRAZolam (XANAX) 0.5 MG tablet, refill request @ [facility] on [address]. Requesting to speak with a nurse about meds. Please call pt back.

## 2016-07-20 ENCOUNTER — Ambulatory Visit (HOSPITAL_COMMUNITY)
Admission: EM | Admit: 2016-07-20 | Discharge: 2016-07-20 | Disposition: A | Discharge status: Home / Self Care | Payer: Medicare Other | Attending: Family Medicine | Admitting: Family Medicine

## 2016-07-20 ENCOUNTER — Encounter (HOSPITAL_COMMUNITY): Payer: Self-pay | Admitting: Emergency Medicine

## 2016-07-20 DIAGNOSIS — F329 Major depressive disorder, single episode, unspecified: Secondary | ICD-10-CM | POA: Diagnosis not present

## 2016-07-20 DIAGNOSIS — N390 Urinary tract infection, site not specified: Secondary | ICD-10-CM | POA: Insufficient documentation

## 2016-07-20 DIAGNOSIS — R300 Dysuria: Secondary | ICD-10-CM | POA: Insufficient documentation

## 2016-07-20 DIAGNOSIS — F419 Anxiety disorder, unspecified: Secondary | ICD-10-CM | POA: Insufficient documentation

## 2016-07-20 DIAGNOSIS — K219 Gastro-esophageal reflux disease without esophagitis: Secondary | ICD-10-CM | POA: Diagnosis not present

## 2016-07-20 LAB — POCT URINALYSIS DIP (DEVICE)
BILIRUBIN URINE: NEGATIVE
Glucose, UA: NEGATIVE mg/dL
KETONES UR: NEGATIVE mg/dL
Nitrite: NEGATIVE
PH: 6 (ref 5.0–8.0)
Protein, ur: 30 mg/dL — AB
Specific Gravity, Urine: >=1.03 (ref 1.005–1.030)
Urobilinogen, UA: 0.2 mg/dL (ref 0.0–1.0)

## 2016-07-20 MED ORDER — NITROFURANTOIN MONOHYD MACRO 100 MG PO CAPS: 100.0000 mg | ORAL_CAPSULE | Freq: Two times a day (BID) | ORAL | 0 refills | Status: DC

## 2016-07-20 MED ORDER — PHENAZOPYRIDINE HCL 200 MG PO TABS: 200.0000 mg | ORAL_TABLET | Freq: Three times a day (TID) | ORAL | 0 refills | Status: DC

## 2016-07-20 NOTE — ED Provider Notes (Signed)
CSN: [id number]     Arrival date & time 07/20/16  1153 History   None    Chief Complaint  Patient presents with  . Urinary Tract Infection   (Consider location/radiation/quality/duration/timing/severity/associated sxs/prior Treatment) Patient c/o urinary sx's and dysuria   The history is provided by the patient.  Urinary Tract Infection  Pain quality:  Burning and aching Pain severity:  Moderate Onset quality:  Sudden Duration:  1 week Timing:  Constant Progression:  Worsening Chronicity:  New Relieved by:  Nothing Worsened by:  Nothing Ineffective treatments:  None tried   Past Medical History:  Diagnosis Date  . Anxiety   . Arthritis   . Depression   . GERD (gastroesophageal reflux disease)   . PONV (postoperative nausea and vomiting)    Past Surgical History:  Procedure Laterality Date  . BUNIONECTOMY     bilateral  . FRONTAL SINUS EXPLORATION Left 09/15/2014   Procedure: LEFT ENDOSCOPIC FRONTAL RECESS  EXPLORATION WITH FUSION IMAGE GUIDANCE NAVIGATION;  Surgeon: Leta Baptist, MD;  Location: [place];  Service: ENT;  Laterality: Left;  . SEPTOPLASTY Bilateral 10/20/2013   Procedure: SEPTOPLASTY;  Surgeon: Ascencion Dike, MD;  Location: [place];  Service: ENT;  Laterality: Bilateral;  . SINUS ENDO W/FUSION Bilateral 10/20/2013   Procedure: ENDOSCOPIC SINUS SURGERY WITH FUSION NAVIGATION, BILATERAL MAXILLARY ANTROSTOMIES, BILATERAL ETHMOIDECTOMIES, BILATERAL SPHENOIDECTOMIES, BILATERAL FRONTAL RECESS EXPLORATION;  Surgeon: Ascencion Dike, MD;  Location: [place];  Service: ENT;  Laterality: Bilateral;  . SINUS ENDO WITH FUSION Left 09/15/2014   Procedure: LEFT ENDOSCOPIC TOTAL ETHMOIDECTOMY WITH FUSION IMAGE GUIDANCE NAVIGATION;  Surgeon: Leta Baptist, MD;  Location: [place];  Service: ENT;  Laterality: Left;  . SPHENOIDECTOMY Right 09/15/2014   Procedure: RIGHT ENDOSCOPIC SPHENOIDECTOMY WITH FUSION IMAGE GUIDANCE  NAVIGATION;  Surgeon: Leta Baptist, MD;  Location: [place];  Service: ENT;  Laterality: Right;   Family History  Problem Relation Age of Onset  . Cancer Mother 35       Lung  . Parkinsonism Father   . Drug abuse Brother   . Depression Brother    Social History  Substance Use Topics  . Smoking status: Never Smoker  . Smokeless tobacco: Never Used  . Alcohol use 0.0 oz/week     Comment: on occasion    OB History    No data available     Review of Systems  Constitutional: Negative.   HENT: Negative.   Eyes: Negative.   Respiratory: Negative.   Cardiovascular: Negative.   Gastrointestinal: Negative.   Endocrine: Negative.   Genitourinary: Positive for dysuria.  Musculoskeletal: Negative.   Allergic/Immunologic: Negative.   Neurological: Negative.   Hematological: Negative.   Psychiatric/Behavioral: Negative.     Allergies  Penicillins; Cephalexin; and Lamictal [lamotrigine]  Home Medications   Prior to Admission medications   Medication Sig Start Date End Date Taking? Authorizing Provider  ALPRAZolam (XANAX) 0.5 MG tablet TAKE ONE TABLET BY MOUTH TWICE DAILY AS NEEDED AND ONE TO TWO AT BEDTIME AS NEEDED FOR ANXIETY OR SLEEP 07/10/16   Sid Falcon, MD  amLODipine (NORVASC) 2.5 MG tablet Take 1 tablet (2.5 mg total) by mouth daily. 03/01/16   Sid Falcon, MD  DIPHENHYDRAMINE HCL, SLEEP, PO Take 25 mg by mouth at bedtime as needed.    [provider]  DULoxetine (CYMBALTA) 30 MG capsule Take 1 capsule (30 mg total) by mouth daily. 07/05/16   Sid Falcon, MD  nitrofurantoin, macrocrystal-monohydrate, (MACROBID) 100 MG capsule Take 1 capsule (100 mg total) by mouth 2 (two) times daily. 07/20/16   Lysbeth Penner, FNP  omeprazole (PRILOSEC) 20 MG capsule Take 1 capsule (20 mg total) by mouth daily. 03/01/16   Sid Falcon, MD  phenazopyridine (PYRIDIUM) 200 MG tablet Take 1 tablet (200 mg total) by mouth 3 (three) times daily. 07/20/16   Lysbeth Penner, FNP   Meds Ordered and Administered this Visit  Medications - No data to display  BP 131/69 (BP Location: Right Arm)   Pulse 98   Temp 98.3 F (36.8 C) (Oral)   Resp 18   SpO2 96%  No data found.   Physical Exam  Constitutional: She appears well-developed and well-nourished.  HENT:  Head: Normocephalic and atraumatic.  Eyes: Conjunctivae and EOM are normal. Pupils are equal, round, and reactive to light.  Neck: Normal range of motion. Neck supple.  Cardiovascular: Normal rate, regular rhythm and normal heart sounds.   Pulmonary/Chest: Effort normal and breath sounds normal.  Abdominal: Soft. Bowel sounds are normal.  Nursing note and vitals reviewed.   Urgent Care Course     Procedures (including critical care time)  Labs Review Labs Reviewed  POCT URINALYSIS DIP (DEVICE) - Abnormal; Notable for the following:       Result Value   Hgb urine dipstick MODERATE (*)    Protein, ur 30 (*)    Leukocytes, UA TRACE (*)    All other components within normal limits  URINE CULTURE    Imaging Review No results found.   Visual Acuity Review  Right Eye Distance:   Left Eye Distance:   Bilateral Distance:    Right Eye Near:   Left Eye Near:    Bilateral Near:         MDM   1. Lower urinary tract infectious disease   2. Dysuria    Macrobid 100mg  one po bid x 10 days  Pyridium 200mg  one po tid x 2 days UA cx    Lysbeth Penner, FNP 07/20/16 1243

## 2016-07-20 NOTE — ED Triage Notes (Signed)
Onset one week ago of uti symptoms, patient rushes to the bathroom for just a few drops of urine.  Low abdominal pain.  Burning with urination

## 2016-07-22 ENCOUNTER — Telehealth: Payer: Self-pay | Admitting: Internal Medicine

## 2016-07-22 LAB — URINE CULTURE: Culture: 70000 — AB

## 2016-07-22 MED ORDER — SULFAMETHOXAZOLE-TRIMETHOPRIM 800-160 MG PO TABS: 1.0000 | ORAL_TABLET | Freq: Two times a day (BID) | ORAL | 0 refills | Status: AC

## 2016-07-22 NOTE — Telephone Encounter (Signed)
Clinical staff, please let patient know that urine culture was positive for Proteus germ, resistant to nitrofurantoin rx given at the urgent care visit but sensitive to trimethoprim/sulfa.  Rx trimethoprim/sulfa sent to the pharmacy of record, [facility] [address].  Stop nitrofurantoin and take trimethoprim/sulfa instead.  Recheck or followup with PCP for further evaluation if symptoms are not improving.  LM

## 2016-08-02 ENCOUNTER — Ambulatory Visit: Payer: Self-pay | Admitting: Internal Medicine

## 2016-08-09 ENCOUNTER — Other Ambulatory Visit: Payer: Self-pay | Admitting: *Deleted

## 2016-08-09 MED ORDER — DULOXETINE HCL 30 MG PO CPEP: 30.0000 mg | ORAL_CAPSULE | Freq: Every day | ORAL | 1 refills | Status: DC

## 2016-08-09 NOTE — Telephone Encounter (Signed)
Received refill request from [facility]) for duloxetine caps, previous refills were sent to [facility].  Request to be sent to pcp for review, please advise.Despina Hidden Cassady7/18/20181:55 PM

## 2016-08-09 NOTE — Addendum Note (Signed)
Addended by: Marcelino Duster on: 08/09/2016 02:31 PM   Modules accepted: Orders

## 2016-08-09 NOTE — Telephone Encounter (Signed)
She is supposed to only be on 30mg , not 90mg .  So I think this may have been sent wrong.  Will refill 30mg  capsules.

## 2016-08-10 ENCOUNTER — Ambulatory Visit: Payer: Self-pay

## 2016-08-10 ENCOUNTER — Encounter: Payer: Self-pay | Admitting: Internal Medicine

## 2016-08-30 ENCOUNTER — Ambulatory Visit (INDEPENDENT_AMBULATORY_CARE_PROVIDER_SITE_OTHER): Payer: Medicare Other | Admitting: Internal Medicine

## 2016-08-30 ENCOUNTER — Encounter: Payer: Self-pay | Admitting: Internal Medicine

## 2016-08-30 VITALS — BP 129/62 | HR 92 | Temp 98.4°F | Ht 61.0 in | Wt 182.5 lb

## 2016-08-30 DIAGNOSIS — H269 Unspecified cataract: Secondary | ICD-10-CM

## 2016-08-30 DIAGNOSIS — E663 Overweight: Secondary | ICD-10-CM

## 2016-08-30 DIAGNOSIS — M150 Primary generalized (osteo)arthritis: Secondary | ICD-10-CM

## 2016-08-30 DIAGNOSIS — Z0000 Encounter for general adult medical examination without abnormal findings: Secondary | ICD-10-CM

## 2016-08-30 DIAGNOSIS — I10 Essential (primary) hypertension: Secondary | ICD-10-CM | POA: Diagnosis not present

## 2016-08-30 DIAGNOSIS — E7800 Pure hypercholesterolemia, unspecified: Secondary | ICD-10-CM | POA: Diagnosis not present

## 2016-08-30 DIAGNOSIS — K219 Gastro-esophageal reflux disease without esophagitis: Secondary | ICD-10-CM

## 2016-08-30 DIAGNOSIS — D229 Melanocytic nevi, unspecified: Secondary | ICD-10-CM

## 2016-08-30 DIAGNOSIS — G4700 Insomnia, unspecified: Secondary | ICD-10-CM | POA: Diagnosis not present

## 2016-08-30 DIAGNOSIS — J324 Chronic pansinusitis: Secondary | ICD-10-CM

## 2016-08-30 DIAGNOSIS — Z79899 Other long term (current) drug therapy: Secondary | ICD-10-CM

## 2016-08-30 DIAGNOSIS — K5790 Diverticulosis of intestine, part unspecified, without perforation or abscess without bleeding: Secondary | ICD-10-CM | POA: Diagnosis not present

## 2016-08-30 DIAGNOSIS — F339 Major depressive disorder, recurrent, unspecified: Secondary | ICD-10-CM

## 2016-08-30 DIAGNOSIS — F5101 Primary insomnia: Secondary | ICD-10-CM

## 2016-08-30 DIAGNOSIS — N898 Other specified noninflammatory disorders of vagina: Secondary | ICD-10-CM | POA: Diagnosis not present

## 2016-08-30 DIAGNOSIS — H259 Unspecified age-related cataract: Secondary | ICD-10-CM

## 2016-08-30 DIAGNOSIS — M159 Polyosteoarthritis, unspecified: Secondary | ICD-10-CM

## 2016-08-30 DIAGNOSIS — E785 Hyperlipidemia, unspecified: Secondary | ICD-10-CM | POA: Diagnosis not present

## 2016-08-30 DIAGNOSIS — R7303 Prediabetes: Secondary | ICD-10-CM

## 2016-08-30 DIAGNOSIS — F331 Major depressive disorder, recurrent, moderate: Secondary | ICD-10-CM

## 2016-08-30 DIAGNOSIS — F411 Generalized anxiety disorder: Secondary | ICD-10-CM | POA: Diagnosis not present

## 2016-08-30 LAB — POCT GLYCOSYLATED HEMOGLOBIN (HGB A1C): Hemoglobin A1C: 5.8

## 2016-08-30 LAB — GLUCOSE, CAPILLARY: GLUCOSE-CAPILLARY: 118 mg/dL — AB (ref 65–99)

## 2016-08-30 NOTE — Assessment & Plan Note (Signed)
BP is well controlled today at 129/62. She is taking her amlodipine without issue.    Plan Continue amlopidine.

## 2016-08-30 NOTE — Assessment & Plan Note (Signed)
>>  ASSESSMENT AND PLAN FOR OSTEOARTHRITIS OF MULTIPLE JOINTS WRITTEN ON 08/30/2016 11:39 AM BY MULLEN, EMILY B, MD  She mainly has knee pain when she walks a lot, but this is improved from previous evaluations.  She is taking aleve daily or ibuprofen PRN with good results.   Plan Continue OTC medications.

## 2016-08-30 NOTE — Assessment & Plan Note (Signed)
Last A1C 5.2, check today.  If still less than 5.7, will resolve.

## 2016-08-30 NOTE — Assessment & Plan Note (Signed)
She mainly has knee pain when she walks a lot, but this is improved from previous evaluations.  She is taking aleve daily or ibuprofen PRN with good results.   Plan Continue OTC medications.

## 2016-08-30 NOTE — Progress Notes (Signed)
   Subjective:    Patient ID: [name], female    DOB: 06-19-1950, 66 y.o.   MRN: [id number]  CC: 2 month follow up for depression  HPI   [name] is a 66yo woman with PMH of depression/anxiety, HTN, GERD, pre-DM, OA who presents for follow up.   At last visit, [name] was having worsening depressive symptoms. We discussed changing her lexapro and she did take duloxetine for a few weeks, however, when she ran out she switched back to Lexapro and feels this is working fine.  She also had to pay for the duloxetine but the lexapro was free. She notes that her symptoms are better at this time.  She had a low point a few weeks ago when her dog, Rich Fuchs, died and when her daughters birthday passed.  She is doing better now and plans to take in a Rescue Dog, Wiley (pit bull).    She further notes return of vaginal discharge. She is not sexually active.  She notices it daily and also has some abdominal cramping, which she reports feels like she is about to start a period.  But she has not had periods for some years.  The discharge is clear to white.  There is never blood brown discharge.  She has not had itching or pain.  She is due for a pap smear.    She went to see an ophthalmologist for hazy vision and was found to have a cataract.  She is saving money now to have this done, as her co-pay is $400.      Review of Systems  Constitutional: Negative for activity change (she reports not exercising given her dog died and having a small apartment), chills and fatigue.  HENT: Positive for congestion and rhinorrhea. Negative for sinus pain and sinus pressure.   Respiratory: Negative for cough and shortness of breath.   Cardiovascular: Negative for chest pain and leg swelling.  Genitourinary: Positive for vaginal discharge. Negative for vaginal bleeding and vaginal pain.  Musculoskeletal: Positive for arthralgias (knee pain).  Neurological: Negative for dizziness and  light-headedness.       Objective:   Physical Exam  Constitutional: She is oriented to person, place, and time. She appears well-developed and well-nourished. No distress.  Overweight  HENT:  Head: Normocephalic and atraumatic.  Cardiovascular: Normal rate, regular rhythm and normal heart sounds.   Pulmonary/Chest: Effort normal and breath sounds normal. No respiratory distress.  Abdominal: Soft. Bowel sounds are normal. She exhibits no distension.  Neurological: She is alert and oriented to person, place, and time.  Psychiatric: She has a normal mood and affect. Her behavior is normal.  Vitals reviewed.   Lipid panel and A1C today.       Assessment & Plan:  RTC in 1 month.   Mole on shoulder Will plan for shave biopsy and removal at next visit.

## 2016-08-30 NOTE — Assessment & Plan Note (Signed)
Her mood is very good today.  She notes that her dog Nittany died and she had a low period after this happened, but she is now set up to take in rescue animals.  She has switched back to Lexapro and feels this is helping her.   Plan Continue Lexapro 20mg  daily.

## 2016-08-30 NOTE — Assessment & Plan Note (Signed)
Seems to be well controlled on lexapro and xanax.   Plan Continue lexapro and xanax.

## 2016-08-30 NOTE — Assessment & Plan Note (Signed)
Relatively well controlled today.  No issues currently.

## 2016-08-30 NOTE — Patient Instructions (Addendum)
Ms. Duman - -  Thank you for coming in to see me today.    Please continue taking your medications as prescribed, including Lexapro.   Please come back to see me on 09/27/16 at 815 for PAP smear and removal of mole on your shoulder.  Please make this a one hour appointment.    Thank you!

## 2016-08-30 NOTE — Assessment & Plan Note (Signed)
Seen on colonoscopy.  No acute issues, no blood in stool, no abdominal pain.

## 2016-08-30 NOTE — Assessment & Plan Note (Signed)
She is mostly averse to screening tests.  She has refused mammograms in the past.  She has some new vaginal discharge, so we will do a pelvic exam at next visit.

## 2016-08-30 NOTE — Assessment & Plan Note (Signed)
Based on her description, it is not completely clear what her symptoms are.  She certainly is due for a PAP and pelvic which she did not have time for today.   Plan RTC in 1 month for pap/pelvic, sooner if any changes.

## 2016-08-30 NOTE — Assessment & Plan Note (Signed)
Last LDL was < 100.  She is vegetarian and has low intake of fat due to this.    Plan Recheck Lipid panel, if normal again, will resolve

## 2016-08-30 NOTE — Assessment & Plan Note (Signed)
Relatively well controlled today.  She is still having drainage and rhinorrhea sometimes.  She further has issues with loss of smell, but none of these are severe.    Plan Continue to monitor.

## 2016-08-30 NOTE — Assessment & Plan Note (Signed)
Following with ophthalmology.  She has a surgery planned, but needs to save up the copay.  She has multiple eye drops to take prior to the procedure.   Plan Follow with opthalmology.

## 2016-08-31 LAB — LIPID PANEL
CHOL/HDL RATIO: 5 ratio — AB (ref 0.0–4.4)
CHOLESTEROL TOTAL: 209 mg/dL — AB (ref 100–199)
HDL: 42 mg/dL (ref >39)
LDL Calculated: 113 mg/dL — ABNORMAL HIGH (ref 0–99)
TRIGLYCERIDES: 270 mg/dL — AB (ref 0–149)
VLDL Cholesterol Cal: 54 mg/dL — ABNORMAL HIGH (ref 5–40)

## 2016-09-13 DIAGNOSIS — H25812 Combined forms of age-related cataract, left eye: Secondary | ICD-10-CM | POA: Diagnosis not present

## 2016-09-27 ENCOUNTER — Other Ambulatory Visit (HOSPITAL_COMMUNITY)
Admission: RE | Admit: 2016-09-27 | Discharge: 2016-09-27 | Disposition: A | Discharge status: Home / Self Care | Payer: Medicare Other | Source: Ambulatory Visit | Attending: Internal Medicine | Admitting: Internal Medicine

## 2016-09-27 ENCOUNTER — Ambulatory Visit (INDEPENDENT_AMBULATORY_CARE_PROVIDER_SITE_OTHER): Payer: Medicare Other | Admitting: Internal Medicine

## 2016-09-27 ENCOUNTER — Encounter: Payer: Self-pay | Admitting: Internal Medicine

## 2016-09-27 VITALS — BP 135/49 | HR 82 | Temp 98.3°F | Ht 61.0 in | Wt 182.8 lb

## 2016-09-27 DIAGNOSIS — N898 Other specified noninflammatory disorders of vagina: Secondary | ICD-10-CM

## 2016-09-27 DIAGNOSIS — D229 Melanocytic nevi, unspecified: Secondary | ICD-10-CM | POA: Insufficient documentation

## 2016-09-27 DIAGNOSIS — D2112 Benign neoplasm of connective and other soft tissue of left upper limb, including shoulder: Secondary | ICD-10-CM | POA: Diagnosis not present

## 2016-09-27 DIAGNOSIS — Z0000 Encounter for general adult medical examination without abnormal findings: Secondary | ICD-10-CM

## 2016-09-27 NOTE — Progress Notes (Signed)
   Subjective:    Patient ID: [name], female    DOB: [birth year], 66 y.o.   MRN: [id number]  CC: Mole removal, discussed at previous visit.   HPI   [name] is a 66yo woman who presents for mole removal and PAP smear.    For the mole, she has had it on her shoulder for the last year that she has noticed.  She thinks that it was smaller before, but has gotten larger.  It irritates her because it is under her bra strap and itches sometimes.  She has tried to twist and pinch it off, but this has not worked.  She also has a large cyst on her earlobe which has been there since she was 14 and has gotten slowly larger.  It has not drained, is not red or irritated. She just doesn't like how it looks cosmetically.    Finally, she is here for a PAP smear, which is due since she had an incomplete study back in 2013.  If this is normal, given age, she can probably cease having PAP smears.    Resident Dr. Alphonzo Grieve assisted me with the procedures.     Review of Systems  Constitutional: Negative for activity change and fatigue.  Genitourinary: Negative for pelvic pain, vaginal bleeding, vaginal discharge and vaginal pain.  Skin:       Mole on shoulder, skin colored, smooth with some redness irritation.    Psychiatric/Behavioral: Positive for dysphoric mood. The patient is nervous/anxious.        Objective:   Physical Exam  Constitutional: She appears well-developed and well-nourished. No distress.  Genitourinary: Uterus normal. There is no rash, tenderness or lesion on the right labia. There is no rash, tenderness or lesion on the left labia. Cervix exhibits no motion tenderness, no discharge and no friability. Right adnexum displays no mass and no tenderness. Left adnexum displays no mass and no tenderness. No erythema in the vagina. No signs of injury around the vagina. No vaginal discharge found.  Skin:       Mole removed and sent for dermatopathology.   PAP smear  completed.       Assessment & Plan:  RTC in 2-3 months for chronic health issues.   Sebaceous cyst on left earlobe This is the most likely explanation.  After obtaining informed consent, the area was prepped and draped in the usual fashion. Anesthesia was obtained with 1% lidocaine. The lesion was lanced with a scalpel.  No fluid could be expressed.  There was minor bleeding and Hemostasis was obtained with pressure. Wound care discussed.

## 2016-09-27 NOTE — Assessment & Plan Note (Signed)
PAP smear done today.  Good visualization of the cervix.  Patient will be called with results.

## 2016-09-27 NOTE — Patient Instructions (Signed)
[name] - -  I am going to send your mole for pathology.  Will call you with results.    I will also send a letter or call you for your PAP smear results.    Please come back to see me in 2-3 months, sooner if you need.    Thank you!   Skin Biopsy, Care After Refer to this sheet in the next few weeks. These instructions provide you with information about caring for yourself after your procedure. Your health care provider may also give you more specific instructions. Your treatment has been planned according to current medical practices, but problems sometimes occur. Call your health care provider if you have any problems or questions after your procedure. What can I expect after the procedure? After the procedure, it is common to have:  Soreness.  Bruising.  Itching.  Follow these instructions at home:  Rest and then return to your normal activities as told by your health care provider.  Take over-the-counter and prescription medicines only as told by your health care provider.  Follow instructions from your health care provider about how to take care of your biopsy site.Make sure you: ? Wash your hands with soap and water before you change your bandage (dressing). If soap and water are not available, use hand sanitizer. ? Change your dressing as told by your health care provider. ? Leave stitches (sutures), skin glue, or adhesive strips in place. These skin closures may need to stay in place for 2 weeks or longer. If adhesive strip edges start to loosen and curl up, you may trim the loose edges. Do not remove adhesive strips completely unless your health care provider tells you to do that. If the biopsy area bleeds, apply gentle pressure for 10 minutes.  Check your biopsy site every day for signs of infection. Check for: ? More redness, swelling, or pain. ? More fluid or blood. ? Warmth. ? Pus or a bad smell.  Keep all follow-up visits as told by your health care  provider. This is important. Contact a health care provider if:  You have more redness, swelling, or pain around your biopsy site.  You have more fluid or blood coming from your biopsy site.  Your biopsy site feels warm to the touch.  You have pus or a bad smell coming from your biopsy site.  You have a fever. Get help right away if:  You have bleeding that does not stop with pressure or a dressing. This information is not intended to replace advice given to you by your health care provider. Make sure you discuss any questions you have with your health care provider. Document Released: 02/05/2015 Document Revised: 09/05/2015 Document Reviewed: 04/08/2014 Elsevier Interactive Patient Education  [employer].

## 2016-09-27 NOTE — Assessment & Plan Note (Signed)
6X61mm mole removed from left shoulder by shave biopsy.  After obtaining informed consent, the area was prepped and draped in the usual fashion. Anesthesia was obtained with 1% lidocaine. The entirety of the lesion was removed by shave biopsy. Hemostasis obtained with pressure and aluminum chloride. Wound care discussed. Specimen sent for dermatopathology.

## 2016-09-29 LAB — CYTOLOGY - PAP: HPV (WINDOPATH): NOT DETECTED

## 2016-10-02 ENCOUNTER — Encounter: Payer: Self-pay | Admitting: Internal Medicine

## 2016-10-02 DIAGNOSIS — H2511 Age-related nuclear cataract, right eye: Secondary | ICD-10-CM | POA: Diagnosis not present

## 2016-10-04 DIAGNOSIS — H25811 Combined forms of age-related cataract, right eye: Secondary | ICD-10-CM | POA: Diagnosis not present

## 2016-10-04 DIAGNOSIS — H2511 Age-related nuclear cataract, right eye: Secondary | ICD-10-CM | POA: Diagnosis not present

## 2016-10-05 ENCOUNTER — Telehealth: Payer: Self-pay | Admitting: Internal Medicine

## 2016-10-05 NOTE — Telephone Encounter (Signed)
Called patient to discuss her pathology results and explained the findings.  Answered all questions.  Also answered patients recent email question about vaginal atrophy.    She was in a good mood and glad to hear her results.

## 2016-10-10 ENCOUNTER — Other Ambulatory Visit: Payer: Self-pay | Admitting: *Deleted

## 2016-10-10 ENCOUNTER — Other Ambulatory Visit: Payer: Self-pay | Admitting: Internal Medicine

## 2016-10-10 DIAGNOSIS — F330 Major depressive disorder, recurrent, mild: Secondary | ICD-10-CM

## 2016-10-11 NOTE — Telephone Encounter (Signed)
Alprazolam called in to [facility]

## 2016-10-29 ENCOUNTER — Other Ambulatory Visit: Payer: Self-pay | Admitting: Internal Medicine

## 2016-10-29 DIAGNOSIS — F3341 Major depressive disorder, recurrent, in partial remission: Secondary | ICD-10-CM

## 2016-10-29 DIAGNOSIS — F419 Anxiety disorder, unspecified: Secondary | ICD-10-CM

## 2016-11-07 ENCOUNTER — Ambulatory Visit: Payer: Self-pay

## 2017-01-12 ENCOUNTER — Other Ambulatory Visit: Payer: Self-pay | Admitting: Internal Medicine

## 2017-01-18 ENCOUNTER — Telehealth: Payer: Self-pay

## 2017-01-18 ENCOUNTER — Other Ambulatory Visit: Payer: Self-pay | Admitting: Internal Medicine

## 2017-01-18 ENCOUNTER — Other Ambulatory Visit: Payer: Self-pay | Admitting: *Deleted

## 2017-01-18 DIAGNOSIS — F330 Major depressive disorder, recurrent, mild: Secondary | ICD-10-CM

## 2017-01-18 NOTE — Telephone Encounter (Signed)
Needs to speak with a nurse about something. Please call pt back.

## 2017-01-18 NOTE — Telephone Encounter (Signed)
Spoke w/ pt, encouraged her to make appt, sent refill request

## 2017-01-24 ENCOUNTER — Telehealth: Payer: Self-pay | Admitting: *Deleted

## 2017-01-24 NOTE — Telephone Encounter (Signed)
rec'd ph call transferred by hme for refill of xanax, called [facility], rec'd electronically, filling now and will notify pt

## 2017-02-14 ENCOUNTER — Ambulatory Visit (INDEPENDENT_AMBULATORY_CARE_PROVIDER_SITE_OTHER): Payer: Medicare Other | Admitting: Internal Medicine

## 2017-02-14 ENCOUNTER — Other Ambulatory Visit: Payer: Self-pay

## 2017-02-14 ENCOUNTER — Encounter: Payer: Self-pay | Admitting: Internal Medicine

## 2017-02-14 ENCOUNTER — Ambulatory Visit: Payer: Self-pay | Admitting: Internal Medicine

## 2017-02-14 DIAGNOSIS — F331 Major depressive disorder, recurrent, moderate: Secondary | ICD-10-CM

## 2017-02-14 DIAGNOSIS — N393 Stress incontinence (female) (male): Secondary | ICD-10-CM | POA: Diagnosis not present

## 2017-02-14 DIAGNOSIS — F419 Anxiety disorder, unspecified: Secondary | ICD-10-CM

## 2017-02-14 DIAGNOSIS — G4700 Insomnia, unspecified: Secondary | ICD-10-CM

## 2017-02-14 DIAGNOSIS — M1990 Unspecified osteoarthritis, unspecified site: Secondary | ICD-10-CM

## 2017-02-14 DIAGNOSIS — Z599 Problem related to housing and economic circumstances, unspecified: Secondary | ICD-10-CM

## 2017-02-14 DIAGNOSIS — Z598 Other problems related to housing and economic circumstances: Secondary | ICD-10-CM

## 2017-02-14 DIAGNOSIS — Z9841 Cataract extraction status, right eye: Secondary | ICD-10-CM

## 2017-02-14 DIAGNOSIS — K219 Gastro-esophageal reflux disease without esophagitis: Secondary | ICD-10-CM

## 2017-02-14 DIAGNOSIS — Z0000 Encounter for general adult medical examination without abnormal findings: Secondary | ICD-10-CM

## 2017-02-14 DIAGNOSIS — F339 Major depressive disorder, recurrent, unspecified: Secondary | ICD-10-CM

## 2017-02-14 DIAGNOSIS — R7303 Prediabetes: Secondary | ICD-10-CM

## 2017-02-14 DIAGNOSIS — I10 Essential (primary) hypertension: Secondary | ICD-10-CM

## 2017-02-14 DIAGNOSIS — R45851 Suicidal ideations: Secondary | ICD-10-CM | POA: Diagnosis not present

## 2017-02-14 DIAGNOSIS — E785 Hyperlipidemia, unspecified: Secondary | ICD-10-CM

## 2017-02-14 DIAGNOSIS — Z9842 Cataract extraction status, left eye: Secondary | ICD-10-CM | POA: Diagnosis not present

## 2017-02-14 DIAGNOSIS — H259 Unspecified age-related cataract: Secondary | ICD-10-CM

## 2017-02-14 DIAGNOSIS — E7800 Pure hypercholesterolemia, unspecified: Secondary | ICD-10-CM

## 2017-02-14 DIAGNOSIS — Z79899 Other long term (current) drug therapy: Secondary | ICD-10-CM

## 2017-02-14 NOTE — Assessment & Plan Note (Signed)
She continues to refuse referral for mammogram or colonoscopy.

## 2017-02-14 NOTE — Assessment & Plan Note (Signed)
We had a long conversation about this today.  She did take some foods from our food pantry.  She is not interested at this time in applying for benefits, but I advised her that we could help her if she was interested in the future.

## 2017-02-14 NOTE — Assessment & Plan Note (Signed)
BP is relatively well controlled today at 134/65. She is taking her amlodipine without issue.  Her renal function was stable at last check.   Plan Continue amlodipine.

## 2017-02-14 NOTE — Assessment & Plan Note (Signed)
LDL is 113 at last check.  ASCVD risk is 12.2%.  She is not interested in medications.  She would like to try diet alterations, but this may be difficult given her financial situation as noted.

## 2017-02-14 NOTE — Assessment & Plan Note (Signed)
We discussed this as well today.  PHQ-9 is high.  She is disinterested in going to psychiatry, psychology or changing her medications.  She continues to be (for > 5 years now) passively suicidal.  Continue to monitor.  Continue lexapro.

## 2017-02-14 NOTE — Patient Instructions (Signed)
[name] - -  Thank you for coming in to see me.   We have a financial advisor in the clinic if you would like to meet with her, just let me know.   Come back to see me in 6 months.   Thanks!

## 2017-02-14 NOTE — Progress Notes (Signed)
Subjective:    Patient ID: [name], female    DOB: 1950/12/10, 67 y.o.   MRN: [id number]  6 month follow up for chronic issues including HTN, GERD  HPI  [name] is a 67yo woman with PMH of HTN, GERD, OA, anxiety/depression, HLD, pre-DM, insomnia, cataract who presents for routine follow up.   Her BP today is well controlled.  She is taking amlodipine.  She reports that she is taking the medication daily and does not have issues with this.   Our main discussion today was around her depression and anxiety.  [name] has long had many stressors which play a role in her depression.  First, she is estranged from her daughter and she just went through the holidays.  She states that she did not hear from her family and was alone for the holidays.  She did get a dog to help with loneliness.  She continues to express passive suicidality without any expressed plan (If I didn't have my dog I would "off" myself, however, she was without a dog for about 6 months and did not follow through in 2018).  She is on lexapro and thinks it helps somewhat.  She declines therapy or seeing psychiatry.  We discussed ways to get more involved in the community, possibly with helping out at a veterinarians or a place for abused dogs, but she is not interested and feels that she would "burn up" gas to get there.  Another stressor is finances.  She lives on a fixed income and is often over drafting from her bank.  We have discussed benefits many time in the past, food closets, etc, but she always declines stating that she does not want to take hand outs.  She will sometimes go 1-2 days without eating, but that is unclear if it is by choice or b/c she does not have food.  She often gets helps from friends, but states that she is alone all the time.  Her PHQ-9 today was 23 and we discussed changing her SSRI, but she did not like this idea, thinking it would not help.  I am not sure if it would either given  most of her stressors are external.  I advised her to seek out SNAP benefits, as it has been a few years since she applied (was only offered $15 a month at the time) and her situation has changed.    We discussed her HDL which is high and discussed her diet.  She has changed to a Vegan diet, which is part of the reason she does not have a lot of money for food (more expensive per her).  She has a 12.2% ASCVD risk, which we discussed, but she is not interested in another medication.  Would like to cut out cheese (vegan) from her diet and see if this helps.    She does reports symptoms of stress incontinence today (with coughing and laughing). We discussed Kegel exercises.    Review of Systems  Constitutional: Negative for activity change, fatigue and fever.  Respiratory: Negative for cough and shortness of breath.   Cardiovascular: Negative for chest pain and leg swelling.  Genitourinary: Negative for difficulty urinating.       Stress incontinence.   Neurological: Negative for tremors and weakness.  Psychiatric/Behavioral: Positive for decreased concentration, dysphoric mood, sleep disturbance and suicidal ideas (passive). Negative for self-injury. The patient is nervous/anxious.        Objective:   Physical Exam  Constitutional: She is oriented to person, place, and time. She appears well-developed and well-nourished. No distress.  HENT:  Head: Normocephalic and atraumatic.  Eyes: Conjunctivae are normal. No scleral icterus.  Neurological: She is alert and oriented to person, place, and time.  Skin: Skin is warm and dry.  Psychiatric: Her mood appears not anxious. Her affect is not blunt, not labile and not inappropriate. Her speech is not rapid and/or pressured and not slurred. She is not agitated, not aggressive, not hyperactive and not withdrawn. Thought content is not paranoid. She exhibits a depressed mood. She expresses no homicidal and no suicidal ideation. She expresses no suicidal  plans and no homicidal plans. She is communicative. She is attentive.   No blood work today.       Assessment & Plan:  RTC in 6 months, sooner if needed.

## 2017-02-14 NOTE — Assessment & Plan Note (Signed)
She has undergone bilateral cataract surgery.  She is seeing okay, but still needs readers for seeing small print.  She follows with Dr. Katy Fitch.

## 2017-03-09 ENCOUNTER — Encounter: Payer: Self-pay | Admitting: Internal Medicine

## 2017-03-09 DIAGNOSIS — E669 Obesity, unspecified: Secondary | ICD-10-CM | POA: Insufficient documentation

## 2017-04-29 ENCOUNTER — Other Ambulatory Visit: Payer: Self-pay | Admitting: Internal Medicine

## 2017-04-29 DIAGNOSIS — F330 Major depressive disorder, recurrent, mild: Secondary | ICD-10-CM

## 2017-04-30 NOTE — Telephone Encounter (Signed)
-  Reviewed chart and PMP database.

## 2017-05-24 ENCOUNTER — Ambulatory Visit: Payer: Self-pay

## 2017-05-28 ENCOUNTER — Ambulatory Visit: Payer: Self-pay

## 2017-06-06 ENCOUNTER — Encounter: Payer: Self-pay | Admitting: Internal Medicine

## 2017-07-05 ENCOUNTER — Other Ambulatory Visit: Payer: Self-pay | Admitting: Internal Medicine

## 2017-07-05 DIAGNOSIS — F330 Major depressive disorder, recurrent, mild: Secondary | ICD-10-CM

## 2017-07-05 MED ORDER — ALPRAZOLAM 0.5 MG PO TABS: ORAL_TABLET | ORAL | 1 refills | Status: DC

## 2017-07-05 NOTE — Telephone Encounter (Signed)
PATIENT NEEDS REFILL ON [name]

## 2017-07-12 ENCOUNTER — Encounter: Payer: Self-pay | Admitting: Internal Medicine

## 2017-07-12 ENCOUNTER — Other Ambulatory Visit: Payer: Self-pay

## 2017-07-12 ENCOUNTER — Encounter: Payer: Medicare Other | Admitting: Internal Medicine

## 2017-07-12 NOTE — Progress Notes (Signed)
Patient is under the impression appointment was with her PCP and did not wish to see another provider.  Patient rescheduled with her PCP for next month.  This encounter was created in error - please disregard.

## 2017-07-12 NOTE — Progress Notes (Signed)
Patient left without being seen. Scheduled with PCP 08/15/2017 at 1045. Hubbard Hartshorn, RN, BSN

## 2017-07-13 NOTE — Progress Notes (Signed)
Medicine attending: Erroneous encounter. Patient rescheduled.

## 2017-07-16 ENCOUNTER — Encounter: Payer: Self-pay | Admitting: Internal Medicine

## 2017-07-16 NOTE — Telephone Encounter (Signed)
LM on VM requesting return call to reschedule appt for 6/26 or 08/01/2017 per PCP. Hubbard Hartshorn, RN, BSN

## 2017-08-15 ENCOUNTER — Encounter: Payer: Self-pay | Admitting: Internal Medicine

## 2017-08-15 ENCOUNTER — Other Ambulatory Visit: Payer: Self-pay

## 2017-08-15 ENCOUNTER — Ambulatory Visit (INDEPENDENT_AMBULATORY_CARE_PROVIDER_SITE_OTHER): Payer: Medicare Other | Admitting: Internal Medicine

## 2017-08-15 VITALS — BP 130/68 | HR 80 | Temp 98.2°F | Ht 61.0 in | Wt 197.5 lb

## 2017-08-15 DIAGNOSIS — E669 Obesity, unspecified: Secondary | ICD-10-CM

## 2017-08-15 DIAGNOSIS — F3341 Major depressive disorder, recurrent, in partial remission: Secondary | ICD-10-CM

## 2017-08-15 DIAGNOSIS — F339 Major depressive disorder, recurrent, unspecified: Secondary | ICD-10-CM

## 2017-08-15 DIAGNOSIS — F419 Anxiety disorder, unspecified: Secondary | ICD-10-CM

## 2017-08-15 DIAGNOSIS — R3915 Urgency of urination: Secondary | ICD-10-CM

## 2017-08-15 DIAGNOSIS — M150 Primary generalized (osteo)arthritis: Secondary | ICD-10-CM

## 2017-08-15 DIAGNOSIS — M159 Polyosteoarthritis, unspecified: Secondary | ICD-10-CM

## 2017-08-15 DIAGNOSIS — R7303 Prediabetes: Secondary | ICD-10-CM

## 2017-08-15 DIAGNOSIS — Z6837 Body mass index (BMI) 37.0-37.9, adult: Secondary | ICD-10-CM

## 2017-08-15 DIAGNOSIS — G8929 Other chronic pain: Secondary | ICD-10-CM

## 2017-08-15 DIAGNOSIS — I10 Essential (primary) hypertension: Secondary | ICD-10-CM | POA: Diagnosis not present

## 2017-08-15 DIAGNOSIS — E785 Hyperlipidemia, unspecified: Secondary | ICD-10-CM | POA: Diagnosis not present

## 2017-08-15 DIAGNOSIS — K219 Gastro-esophageal reflux disease without esophagitis: Secondary | ICD-10-CM

## 2017-08-15 DIAGNOSIS — R39198 Other difficulties with micturition: Secondary | ICD-10-CM | POA: Diagnosis not present

## 2017-08-15 DIAGNOSIS — Z598 Other problems related to housing and economic circumstances: Secondary | ICD-10-CM

## 2017-08-15 DIAGNOSIS — Z791 Long term (current) use of non-steroidal anti-inflammatories (NSAID): Secondary | ICD-10-CM

## 2017-08-15 DIAGNOSIS — Z79899 Other long term (current) drug therapy: Secondary | ICD-10-CM

## 2017-08-15 DIAGNOSIS — Z638 Other specified problems related to primary support group: Secondary | ICD-10-CM

## 2017-08-15 DIAGNOSIS — F331 Major depressive disorder, recurrent, moderate: Secondary | ICD-10-CM

## 2017-08-15 DIAGNOSIS — E7800 Pure hypercholesterolemia, unspecified: Secondary | ICD-10-CM

## 2017-08-15 LAB — POCT GLYCOSYLATED HEMOGLOBIN (HGB A1C): Hemoglobin A1C: 5.6 % (ref 4.0–5.6)

## 2017-08-15 LAB — GLUCOSE, CAPILLARY: GLUCOSE-CAPILLARY: 102 mg/dL — AB (ref 70–99)

## 2017-08-15 MED ORDER — OMEPRAZOLE 20 MG PO CPDR: 20.0000 mg | DELAYED_RELEASE_CAPSULE | Freq: Every day | ORAL | 3 refills | Status: DC

## 2017-08-15 MED ORDER — AMLODIPINE BESYLATE 2.5 MG PO TABS: 2.5000 mg | ORAL_TABLET | Freq: Every day | ORAL | 3 refills | Status: DC

## 2017-08-15 MED ORDER — ESCITALOPRAM OXALATE 20 MG PO TABS: 20.0000 mg | ORAL_TABLET | Freq: Every day | ORAL | 3 refills | Status: DC

## 2017-08-15 NOTE — Assessment & Plan Note (Signed)
She has well controlled symptoms currently on omeprazole.  She is taking advil for her chronic pains, last renal function check was okay.  She could benefit from a BMET at next visit and likely a CBC, though she denies any obvious blood loss.   Plan Continue omeprazole BMET and CBC at next visit.

## 2017-08-15 NOTE — Assessment & Plan Note (Signed)
Last A1C about a year ago was 5.8.  Given weight gain, will recheck A1C today.

## 2017-08-15 NOTE — Assessment & Plan Note (Signed)
She is not treated at this time, has requested dietary modifications to help.   Plan Check LDL at next visit.

## 2017-08-15 NOTE — Progress Notes (Signed)
Subjective:    Patient ID: [name], female    DOB: [birth year], 67 y.o.   MRN: [id number]  6 month follow up for HTN  HPI   [name] is a 67yo woman with PMH of anxiety/MDD, HLD, GERD, OA, HTN who presents for routine follow up.   [name] has HTN.  Her BP today was 130/68 which is well controlled.  She is taking amlodipine without issue.  She was worried her pressure would be higher and was relieved to hear that it was not.    Voiding issue - went to UC and got put on Pyridium last year around this time.  She notes that she will sometimes have the urge to go and then only have a little bit of urine come out.  She has not lost her urine.  She has no pain in the abdomen, dysuria or odor to the urine.  She quickly changed the subject and did not wish to discuss this more.   She continues to have issues with finances.  She notes difficulty with her car, living situation and having enough food to eat.  She is also taking lots of advil for chronic OA pain.  She always appears well groomed, well dressed and with new-ish clothes and shoes when she comes to see me.  I again recommended seeing our financial advisor and/or a referral to [facility] for social work support possibly to find a cheaper living situation or for transportation alternatives.  She was not interested today.    She has major depression, driven a lot by her estrangement from her family.  She again expressed that they will not take her calls or return her calls.  She notes that "no one would care if I die."  She does have a dog for which she cares for and an elderly neighbor with whom she spends time.  She has reported passive ideas about her own death since I have known her 04/22/11).  She has no active plan at the moment given her dog is blind and needs her to care for her.     She notes weight gain.  She has gained about 9 pounds in the last 6 months, prior to that was stable around 180 - 188#.  She notes increased  sedentary nature, increased intake of foods such as noodles and ice cream.  She is vegan and has limited dietary options due to finances.  She took information for 2 food banks today.  She is not interested in seeing nutrition or our Development worker, community.    Review of Systems  Constitutional: Positive for activity change. Negative for appetite change, fatigue and unexpected weight change.  Respiratory: Negative for cough and shortness of breath.   Cardiovascular: Negative for chest pain and leg swelling.  Gastrointestinal: Negative for abdominal distention and anal bleeding.  Genitourinary: Positive for difficulty urinating and urgency. Negative for dysuria, flank pain, frequency and hematuria.  Musculoskeletal: Positive for arthralgias. Negative for gait problem and joint swelling.  Neurological: Negative for dizziness and weakness.       Objective:   Physical Exam  Constitutional: She is oriented to person, place, and time. She appears well-developed and well-nourished. No distress.  Cardiovascular: Normal rate and regular rhythm.  No murmur heard. Pulmonary/Chest: Effort normal. No respiratory distress.  Neurological: She is alert and oriented to person, place, and time.  Skin: Skin is warm and dry.  Psychiatric: She has a normal mood and affect. Her behavior is normal.  Her mood is normal for her, quickly sad and tearful to happy and joking.   Nursing note and vitals reviewed.  A1C today.     Assessment & Plan:  RTC in 2-3 months

## 2017-08-15 NOTE — Patient Instructions (Signed)
Ms. Coyt - -  Thank you for coming in to see me today.  It was a pleasure talking with you.   Please keep taking your medications as prescribed.   If you have any more problems voiding, please call the clinic.    Please come back to see me in 2-3 months, sooner if you need.

## 2017-08-15 NOTE — Assessment & Plan Note (Signed)
We discussed diet and exercise today.  Much of her ability to eat a regular diet and exercise is disrupted by her depression.  She is not interested in seeing Ms. Plyler in nutrition today.

## 2017-08-15 NOTE — Assessment & Plan Note (Signed)
BP is well controlled at 130/68 today. She is taking amlodipine and notes no side effects.   Plan Continue amlodipine 2.5mg  daily

## 2017-08-15 NOTE — Assessment & Plan Note (Signed)
She takes advil.  We discussed the risk of taking too much, and she is aware of them.  Advised she take her medications as noted on the recommendations.

## 2017-08-15 NOTE — Assessment & Plan Note (Signed)
>>  ASSESSMENT AND PLAN FOR OSTEOARTHRITIS OF MULTIPLE JOINTS WRITTEN ON 08/15/2017 12:54 PM BY MULLEN, EMILY B, MD  She takes advil.  We discussed the risk of taking too much, and she is aware of them.  Advised she take her medications as noted on the recommendations.

## 2017-08-15 NOTE — Assessment & Plan Note (Signed)
She reports mood is okay, but she continues to have stressors including finances, estrangement from her family.  I expressed support today.  Offered [facility] referall and referral to our financial planner which she declined.   Plan Continue Lexapro, PRN xanax.

## 2017-08-21 ENCOUNTER — Encounter: Payer: Self-pay | Admitting: Internal Medicine

## 2017-08-23 ENCOUNTER — Encounter: Payer: Self-pay | Admitting: *Deleted

## 2017-09-09 ENCOUNTER — Other Ambulatory Visit: Payer: Self-pay | Admitting: Internal Medicine

## 2017-09-09 ENCOUNTER — Encounter: Payer: Self-pay | Admitting: Internal Medicine

## 2017-09-09 DIAGNOSIS — F330 Major depressive disorder, recurrent, mild: Secondary | ICD-10-CM

## 2017-09-10 ENCOUNTER — Encounter: Payer: Self-pay | Admitting: Internal Medicine

## 2017-09-10 ENCOUNTER — Other Ambulatory Visit: Payer: Self-pay | Admitting: *Deleted

## 2017-09-10 DIAGNOSIS — F330 Major depressive disorder, recurrent, mild: Secondary | ICD-10-CM

## 2017-09-10 MED ORDER — ALPRAZOLAM 0.5 MG PO TABS: ORAL_TABLET | ORAL | 0 refills | Status: DC

## 2017-09-22 ENCOUNTER — Encounter: Payer: Self-pay | Admitting: Internal Medicine

## 2017-09-28 ENCOUNTER — Encounter: Payer: Self-pay | Admitting: Internal Medicine

## 2017-10-01 ENCOUNTER — Encounter: Payer: Self-pay | Admitting: Internal Medicine

## 2017-10-02 ENCOUNTER — Telehealth: Payer: Self-pay | Admitting: *Deleted

## 2017-10-02 ENCOUNTER — Encounter: Payer: Self-pay | Admitting: Internal Medicine

## 2017-10-02 NOTE — Telephone Encounter (Signed)
Thank you :)

## 2017-10-02 NOTE — Telephone Encounter (Signed)
Called pt - no answer; left message to call the office . 

## 2017-10-02 NOTE — Telephone Encounter (Signed)
Called pt - stated she does not feel she needs to see a doctor about the cat bite. Stated the site is almost gone. But will call if anything changes.

## 2017-10-02 NOTE — Telephone Encounter (Signed)
-----   Message from Sid Falcon, MD sent at 10/01/2017  4:30 PM EDT ----- Please call patient for appointment this week asap.   She has a cat bite, needs likely tetanus and maybe Abx.   Thanks

## 2017-10-02 NOTE — Telephone Encounter (Signed)
Can you have her come in for tetanus booster?  Or go to pharmacy for Td?  Thanks!

## 2017-10-02 NOTE — Telephone Encounter (Signed)
Pt stated she's willing to go the her pharmacy to get a Td.  I called [facility] to be sure rx is not needed which they stated it is not. Called the pt - left compliant message she can go to her pharmacy; rx not needed.

## 2017-10-11 ENCOUNTER — Other Ambulatory Visit: Payer: Self-pay | Admitting: Internal Medicine

## 2017-10-11 DIAGNOSIS — F330 Major depressive disorder, recurrent, mild: Secondary | ICD-10-CM

## 2017-10-12 ENCOUNTER — Encounter: Payer: Self-pay | Admitting: Internal Medicine

## 2017-10-12 DIAGNOSIS — F330 Major depressive disorder, recurrent, mild: Secondary | ICD-10-CM

## 2017-10-14 ENCOUNTER — Encounter: Payer: Self-pay | Admitting: Internal Medicine

## 2017-10-15 ENCOUNTER — Encounter: Payer: Self-pay | Admitting: Internal Medicine

## 2017-10-23 ENCOUNTER — Encounter: Payer: Self-pay | Admitting: Internal Medicine

## 2017-11-01 ENCOUNTER — Encounter: Payer: Self-pay | Admitting: Internal Medicine

## 2017-11-02 ENCOUNTER — Encounter: Payer: Self-pay | Admitting: Internal Medicine

## 2017-11-11 ENCOUNTER — Encounter: Payer: Self-pay | Admitting: Internal Medicine

## 2017-11-11 DIAGNOSIS — F330 Major depressive disorder, recurrent, mild: Secondary | ICD-10-CM

## 2017-11-13 MED ORDER — ALPRAZOLAM 0.5 MG PO TABS: ORAL_TABLET | ORAL | 2 refills | Status: DC

## 2017-11-25 ENCOUNTER — Encounter: Payer: Self-pay | Admitting: Internal Medicine

## 2017-11-29 ENCOUNTER — Encounter: Payer: Self-pay | Admitting: Internal Medicine

## 2017-12-03 ENCOUNTER — Encounter: Payer: Self-pay | Admitting: Internal Medicine

## 2017-12-12 ENCOUNTER — Encounter: Payer: Self-pay | Admitting: Internal Medicine

## 2017-12-13 ENCOUNTER — Encounter: Payer: Self-pay | Admitting: Internal Medicine

## 2017-12-14 ENCOUNTER — Telehealth: Payer: Self-pay | Admitting: Internal Medicine

## 2017-12-14 ENCOUNTER — Encounter: Payer: Self-pay | Admitting: Internal Medicine

## 2017-12-14 NOTE — Telephone Encounter (Signed)
Dr. Daryll Drown could you please let me know what day you are willing to see this patient.  Please refer to her my chart message below.    ----- Message -----  From: Talmage Nap  Sent: 12/13/2017  4:07 PM EST  To: Imp Triage Nurse Pool  Subject: Non-Urgent Medical Question             Merril Abbe,   I got a message fromDr Daryll Drown & she told me she won't have anything available until January.  She said I could make an appointment any day that she has some time around 3:00.  So if you could just schedule something & message me, I'll be there.   Thank you,   Have a Happy Thanksgiving!

## 2017-12-19 NOTE — Telephone Encounter (Signed)
Can you schedule her on 12/11 at 3pm?  Thanks.    EBM

## 2018-01-02 ENCOUNTER — Encounter: Payer: Self-pay | Admitting: Internal Medicine

## 2018-01-02 ENCOUNTER — Other Ambulatory Visit: Payer: Self-pay

## 2018-01-02 ENCOUNTER — Ambulatory Visit (INDEPENDENT_AMBULATORY_CARE_PROVIDER_SITE_OTHER): Payer: Medicare Other | Admitting: Internal Medicine

## 2018-01-02 DIAGNOSIS — L853 Xerosis cutis: Secondary | ICD-10-CM | POA: Diagnosis not present

## 2018-01-02 DIAGNOSIS — Z79899 Other long term (current) drug therapy: Secondary | ICD-10-CM | POA: Diagnosis not present

## 2018-01-02 DIAGNOSIS — Z639 Problem related to primary support group, unspecified: Secondary | ICD-10-CM

## 2018-01-02 DIAGNOSIS — Z0000 Encounter for general adult medical examination without abnormal findings: Secondary | ICD-10-CM

## 2018-01-02 DIAGNOSIS — Z599 Problem related to housing and economic circumstances, unspecified: Secondary | ICD-10-CM | POA: Diagnosis not present

## 2018-01-02 DIAGNOSIS — F339 Major depressive disorder, recurrent, unspecified: Secondary | ICD-10-CM

## 2018-01-02 DIAGNOSIS — F331 Major depressive disorder, recurrent, moderate: Secondary | ICD-10-CM

## 2018-01-02 MED ORDER — BUPROPION HCL 75 MG PO TABS: 75.0000 mg | ORAL_TABLET | Freq: Two times a day (BID) | ORAL | 3 refills | Status: DC

## 2018-01-02 NOTE — Patient Instructions (Signed)
[name] - -  Thank you for coming in to see me today.  For your mood, let's try adding Bupropion 75mg  twice per day to your regimen.  This will help with stabilizing your mood for you.  You may also have a harder time over the Holidays, which may get better in the new year.  We also have a counselor here in clinic, [place].  She would be a great resource for you, let me know if you would like to talk to her.   For your skin - -  Try products with the following active ingredient - -  Hyaluronic acid (moisturizer) Retinol, retinoic acid - for this product, try using only 2-3 times per week to avoid redness.    Thank you!   Come in to see me in 4-6 weeks.

## 2018-01-02 NOTE — Progress Notes (Signed)
   Subjective:    Patient ID: [name], female    DOB: Aug 04, 1950, 67 y.o.   MRN: [id number]  CC: Follow up for depression  HPI  [name] is a 67yo woman with PMH of HTN, GERD, Depression/anxiety who presents per her request for worsening of depression.  She reports that she has been feeling low.  She spent the Thanksgiving holiday only with her dog.  She has limited rapport with the other tenants in her current living situation.  She has a dog, [name], who gives her joy.  She is estranged from her family and her grandson turns 51 next week.  She notes that she is "paying for" the mistakes she made a long time ago.  She is on lexapro and xanax. She is very worried about her weight as this keeps going up, as she is not very active.  Her BP was initially elevated and then improved on recheck.  She is tearful through discussion. Her PHq-9 is elevated at 20.  She does not have SI or a plan, though she is passively disinterested in the chance of her own death.   We did also discuss redness and dryness of her skin.  She tried a product and developed redness.  She has make up on today, so the redness is difficult to appreciate.  There is dry skin, normal for age and time of year.  She has a diffuse erythematous patch on lateral nose and middle forehead, worse with rubbing.  No raised areas, no bumps, no skin breakdown.   Review of Systems  Constitutional: Positive for unexpected weight change (gain). Negative for activity change and appetite change.  Respiratory: Negative for cough and shortness of breath.   Musculoskeletal: Positive for arthralgias and back pain.  Neurological: Positive for headaches. Negative for weakness.       Objective:   Physical Exam Constitutional:      General: She is not in acute distress.    Appearance: She is obese. She is not ill-appearing, toxic-appearing or diaphoretic.  Skin:    Coloration: Skin is not ashen, jaundiced, mottled or pale.   Findings: Erythema present. Rash is not crusting, macular, papular, pustular or urticarial.  Neurological:     Mental Status: She is alert and oriented to person, place, and time.  Psychiatric:        Attention and Perception: Attention and perception normal.        Mood and Affect: Mood is depressed. Affect is labile and tearful.        Speech: Speech is not rapid and pressured, slurred or tangential.        Behavior: Behavior normal. Behavior is cooperative.        Thought Content: Thought content normal. Thought content does not include homicidal or suicidal plan.        Judgment: Judgment normal.     Discussed HCV testing today, she will get at a later date.      Assessment & Plan:  RTC in 4-6 weeks.   Skin dryness, redness - We discussed anti aging creams with hyaluronic acid and retinols.  She will try something OTC.

## 2018-01-04 NOTE — Assessment & Plan Note (Signed)
Uncontrolled.  Related to her financial situation and relation to her family.  She is on lexapro, but uncontrolled.  PHQ-9 is 20.  We discussed multiple options including speaking with our counselor Miquel Dunn (she is currently not interested), a new medication or change in habits.  I attempted to discuss volunteering, but she is not interested because she feels she cannot leave her dog.  I discussed with her meeting new people, but she did not seem positive about this option.  She is amenable to trying a new medication.  We will try to augment her SSRI with Bupropion 75mg  BID and see how she does.  I will see her back in 4-6 weeks.  I reminded her to call the clinic if she has any needs.

## 2018-01-04 NOTE — Assessment & Plan Note (Signed)
She continues to be disinterested in screening because it "would not matter if she died."  She is okay with HCV screening, which will be completed at next blood draw need.

## 2018-02-07 ENCOUNTER — Encounter: Payer: Self-pay | Admitting: Internal Medicine

## 2018-02-08 ENCOUNTER — Other Ambulatory Visit: Payer: Self-pay

## 2018-02-08 ENCOUNTER — Ambulatory Visit (INDEPENDENT_AMBULATORY_CARE_PROVIDER_SITE_OTHER): Payer: Medicare Other | Admitting: Internal Medicine

## 2018-02-08 ENCOUNTER — Encounter: Payer: Self-pay | Admitting: Internal Medicine

## 2018-02-08 VITALS — BP 138/69 | HR 110 | Temp 98.2°F | Ht 61.0 in | Wt 196.6 lb

## 2018-02-08 DIAGNOSIS — M545 Low back pain: Secondary | ICD-10-CM | POA: Diagnosis not present

## 2018-02-08 DIAGNOSIS — G8929 Other chronic pain: Secondary | ICD-10-CM

## 2018-02-08 DIAGNOSIS — Z791 Long term (current) use of non-steroidal anti-inflammatories (NSAID): Secondary | ICD-10-CM

## 2018-02-08 DIAGNOSIS — F419 Anxiety disorder, unspecified: Secondary | ICD-10-CM

## 2018-02-08 DIAGNOSIS — K219 Gastro-esophageal reflux disease without esophagitis: Secondary | ICD-10-CM

## 2018-02-08 DIAGNOSIS — G4700 Insomnia, unspecified: Secondary | ICD-10-CM

## 2018-02-08 DIAGNOSIS — F330 Major depressive disorder, recurrent, mild: Secondary | ICD-10-CM

## 2018-02-08 DIAGNOSIS — Z79899 Other long term (current) drug therapy: Secondary | ICD-10-CM

## 2018-02-08 DIAGNOSIS — F339 Major depressive disorder, recurrent, unspecified: Secondary | ICD-10-CM

## 2018-02-08 DIAGNOSIS — I10 Essential (primary) hypertension: Secondary | ICD-10-CM

## 2018-02-08 DIAGNOSIS — F331 Major depressive disorder, recurrent, moderate: Secondary | ICD-10-CM

## 2018-02-08 DIAGNOSIS — F3341 Major depressive disorder, recurrent, in partial remission: Secondary | ICD-10-CM

## 2018-02-08 MED ORDER — LIDOCAINE 2 % EX GEL: 1.0000 | Freq: Two times a day (BID) | CUTANEOUS | 1 refills | Status: DC | PRN

## 2018-02-08 MED ORDER — AMLODIPINE BESYLATE 2.5 MG PO TABS: 2.5000 mg | ORAL_TABLET | Freq: Every day | ORAL | 3 refills | Status: DC

## 2018-02-08 MED ORDER — OMEPRAZOLE 20 MG PO CPDR: 20.0000 mg | DELAYED_RELEASE_CAPSULE | Freq: Every day | ORAL | 3 refills | Status: DC

## 2018-02-08 MED ORDER — ESCITALOPRAM OXALATE 20 MG PO TABS: 20.0000 mg | ORAL_TABLET | Freq: Every day | ORAL | 3 refills | Status: DC

## 2018-02-08 MED ORDER — BUPROPION HCL 75 MG PO TABS: 150.0000 mg | ORAL_TABLET | Freq: Two times a day (BID) | ORAL | 0 refills | Status: DC

## 2018-02-08 NOTE — Telephone Encounter (Signed)
-----   Message from Sid Falcon, MD sent at 02/08/2018 10:41 AM EST ----- Ms. Mantei just called me and reported uncontrolled leg and back pain.  Can one of you please call her and assess her symptoms and see if she can come in to the clinic today?  If no appointments, see if she needs to go to the ED?  Thank you!!

## 2018-02-08 NOTE — Assessment & Plan Note (Signed)
BP today 138/69. Refilled amlodipine 2.5 mg daily.

## 2018-02-08 NOTE — Telephone Encounter (Signed)
Patient has been scheduled for 3:15 today in [place]. Hubbard Hartshorn, RN, BSN

## 2018-02-08 NOTE — Patient Instructions (Addendum)
Thank you for allowing [place] to provide your care today. Today we discussed your back pain and depressed mood.  Your physical exam is not concerning for an acute nerve impingement.  However you may take benefit of getting steroid spinal injection by an orthopedic surgeon.  I sent a referral for that for you.  I also prescribe lidocaine gel that you can apply on painful area once or twice a day as needed.  For your depressed mood, I increase Wellbutrin to 150 mg twice a day and will evaluate you to see how it works.  As we talked, I can send you to talk to our counselor whenever you decide to do so.  I send the refills for medications as you requested.  Please continue to take them as instructed before.  Please follow-up with [place] as needed.   Should you have any questions or concerns please call the internal medicine clinic at [phone].

## 2018-02-08 NOTE — Progress Notes (Signed)
   CC: Low back pain  HPI:  [name] is a 68 y.o. female with past medical history listed below, presented to clinic due to low back pain.  Please see problem based charting for further details and assessment and plan.  Past Medical History:  Diagnosis Date  . Anxiety   . Arthritis   . Depression   . GERD (gastroesophageal reflux disease)   . PONV (postoperative nausea and vomiting)    Review of Systems:  Review of Systems  Constitutional: Negative for chills and fever.  Respiratory: Negative for cough, shortness of breath and wheezing.   Gastrointestinal: Negative for abdominal pain.  Genitourinary: Negative for dysuria, frequency and urgency.  Neurological: Negative for sensory change, focal weakness and weakness.  Psychiatric/Behavioral: Positive for depression. The patient has insomnia.      Physical Exam:  Vitals:   02/08/18 1446  BP: 138/69  Pulse: (!) 110  Temp: 98.2 F (36.8 C)  TempSrc: Oral  SpO2: 98%  Weight: 196 lb 9.6 oz (89.2 kg)  Height: 5\' 1"  (1.549 m)   Physical Exam Constitutional:      Appearance: She is not ill-appearing.  HENT:     Head: Normocephalic and atraumatic.  Eyes:     Extraocular Movements: Extraocular movements intact.     Pupils: Pupils are equal, round, and reactive to light.  Cardiovascular:     Rate and Rhythm: Normal rate and regular rhythm.     Pulses: Normal pulses.     Heart sounds: No murmur.  Pulmonary:     Effort: No respiratory distress.     Breath sounds: No wheezing or rales.  Abdominal:     General: Bowel sounds are normal.     Palpations: Abdomen is soft.     Tenderness: There is no abdominal tenderness.  Musculoskeletal:        General: Tenderness present.       Arms:  Neurological:     General: No focal deficit present.     Mental Status: She is alert and oriented to person, place, and time.     Sensory: No sensory deficit.     Motor: No weakness.     Comments: SLR negative    Psychiatric:        Mood and Affect: Mood is depressed.        Speech: Speech normal.      Assessment & Plan:   See Encounters Tab for problem based charting.  Patient seen with Dr. Daryll Drown

## 2018-02-08 NOTE — Assessment & Plan Note (Signed)
Still has depressed mood, expresses loneliness, she was started with bupropion 75 mg twice daily on last visit.  She mentions that she is not sure if she has taken the night dose.  We again offered referral to our behavioral health counselor.  She is not interested at this time again but will think about that.  I recommended her to  try it at least once.  -I increase bupropion to 150 mg twice daily -Refilled Lexapro -Recommended her to come back to clinic any time as needed

## 2018-02-08 NOTE — Assessment & Plan Note (Deleted)
.  hh

## 2018-02-08 NOTE — Assessment & Plan Note (Signed)
Symptoms are controlled.  Refilled omeprazole.  Patient is very concerned about her back pain and still takes ibuprofen. She agrees to do blood test next visit.

## 2018-02-08 NOTE — Assessment & Plan Note (Addendum)
Patient came to the clinic today complaining of constant low back pain.  Seems to be a chronic issue.  Her pain gets worse when standing or walking for long time.  Denies any weakness numbness or tingling.  Denies any trauma or major heavy lifting, but she did some cleaning at home and her bathroom and believes it made it worse.  She is taking ibuprofen for the pain. (Sometimes 1200 mg at that time) She prefer not to take any opioid because she is scared to get addicted to that. On exam: Has localized tenderness on lumbar spine.  No paraspinal tenderness.  SLR is negative.  Sensory and motor are intact.  Likely secondary to lumbar spondylosis and degenerative disc disease, vs spinal canal stenosis. No red flags at this time for imaging. Recommended water aerobic (patient declined) Explained the side effect of taking high dose NSAID. (Refilled Omeprazol to continue to take while on NSAID. And will do CBC and BMP next visit) -Recommended and placed orthopedic surgeon referral to be evaluated for spinal steroid injection -Lidocaine gel BID PRN

## 2018-02-11 ENCOUNTER — Encounter: Payer: Self-pay | Admitting: Internal Medicine

## 2018-02-12 ENCOUNTER — Other Ambulatory Visit: Payer: Self-pay | Admitting: *Deleted

## 2018-02-12 ENCOUNTER — Encounter: Payer: Self-pay | Admitting: Internal Medicine

## 2018-02-12 ENCOUNTER — Other Ambulatory Visit: Payer: Self-pay | Admitting: Internal Medicine

## 2018-02-12 DIAGNOSIS — F330 Major depressive disorder, recurrent, mild: Secondary | ICD-10-CM

## 2018-02-12 NOTE — Progress Notes (Signed)
Internal Medicine Clinic Attending  I saw and evaluated the patient.  I personally confirmed the key portions of the history and exam documented by Dr. Masoudi and I reviewed pertinent patient test results.  The assessment, diagnosis, and plan were formulated together and I agree with the documentation in the resident's note. 

## 2018-02-12 NOTE — Telephone Encounter (Signed)
Pt was made aware that script for antidepressant went to [facility]. Xanax is the only one at [facility], she ask me why it was 3.80, she was ask to call [facility] and discuss it with them.

## 2018-02-13 ENCOUNTER — Encounter: Payer: Self-pay | Admitting: Internal Medicine

## 2018-02-13 ENCOUNTER — Other Ambulatory Visit: Payer: Self-pay | Admitting: *Deleted

## 2018-02-13 DIAGNOSIS — F330 Major depressive disorder, recurrent, mild: Secondary | ICD-10-CM

## 2018-02-14 ENCOUNTER — Telehealth: Payer: Self-pay | Admitting: *Deleted

## 2018-02-14 NOTE — Telephone Encounter (Signed)
Pt is calling wanting her xanax refilled

## 2018-02-15 ENCOUNTER — Encounter: Payer: Self-pay | Admitting: Internal Medicine

## 2018-02-15 MED ORDER — ALPRAZOLAM 0.5 MG PO TABS: ORAL_TABLET | ORAL | 2 refills | Status: DC

## 2018-02-15 NOTE — Telephone Encounter (Signed)
Filled

## 2018-02-17 ENCOUNTER — Encounter: Payer: Self-pay | Admitting: Internal Medicine

## 2018-02-17 DIAGNOSIS — F330 Major depressive disorder, recurrent, mild: Secondary | ICD-10-CM

## 2018-02-21 ENCOUNTER — Encounter: Payer: Self-pay | Admitting: Internal Medicine

## 2018-02-21 MED ORDER — BUPROPION HCL 75 MG PO TABS: 150.0000 mg | ORAL_TABLET | Freq: Two times a day (BID) | ORAL | 3 refills | Status: DC

## 2018-02-25 ENCOUNTER — Ambulatory Visit (INDEPENDENT_AMBULATORY_CARE_PROVIDER_SITE_OTHER): Payer: Self-pay | Admitting: Family Medicine

## 2018-02-26 ENCOUNTER — Encounter (INDEPENDENT_AMBULATORY_CARE_PROVIDER_SITE_OTHER): Payer: Self-pay | Admitting: Family Medicine

## 2018-02-26 ENCOUNTER — Ambulatory Visit (INDEPENDENT_AMBULATORY_CARE_PROVIDER_SITE_OTHER): Payer: Medicare Other | Admitting: Family Medicine

## 2018-02-28 ENCOUNTER — Encounter: Payer: Self-pay | Admitting: Internal Medicine

## 2018-03-05 ENCOUNTER — Encounter: Payer: Self-pay | Admitting: Internal Medicine

## 2018-03-06 ENCOUNTER — Encounter: Payer: Self-pay | Admitting: Internal Medicine

## 2018-03-11 ENCOUNTER — Encounter: Payer: Self-pay | Admitting: Internal Medicine

## 2018-03-12 ENCOUNTER — Ambulatory Visit (INDEPENDENT_AMBULATORY_CARE_PROVIDER_SITE_OTHER): Payer: Medicare Other | Admitting: Family Medicine

## 2018-04-07 ENCOUNTER — Encounter: Payer: Self-pay | Admitting: Internal Medicine

## 2018-04-08 ENCOUNTER — Encounter: Payer: Self-pay | Admitting: Internal Medicine

## 2018-05-01 ENCOUNTER — Encounter: Payer: Self-pay | Admitting: Internal Medicine

## 2018-05-07 ENCOUNTER — Encounter: Payer: Self-pay | Admitting: Internal Medicine

## 2018-05-07 DIAGNOSIS — F330 Major depressive disorder, recurrent, mild: Secondary | ICD-10-CM

## 2018-05-08 MED ORDER — BUPROPION HCL 75 MG PO TABS: 150.0000 mg | ORAL_TABLET | Freq: Two times a day (BID) | ORAL | 3 refills | Status: DC

## 2018-05-08 MED ORDER — ALPRAZOLAM 0.5 MG PO TABS: ORAL_TABLET | ORAL | 2 refills | Status: DC

## 2018-05-13 ENCOUNTER — Encounter: Payer: Self-pay | Admitting: Internal Medicine

## 2018-05-13 ENCOUNTER — Other Ambulatory Visit: Payer: Self-pay | Admitting: Internal Medicine

## 2018-05-13 DIAGNOSIS — F330 Major depressive disorder, recurrent, mild: Secondary | ICD-10-CM

## 2018-05-13 DIAGNOSIS — F419 Anxiety disorder, unspecified: Secondary | ICD-10-CM

## 2018-05-13 DIAGNOSIS — F3341 Major depressive disorder, recurrent, in partial remission: Secondary | ICD-10-CM

## 2018-05-13 DIAGNOSIS — K219 Gastro-esophageal reflux disease without esophagitis: Secondary | ICD-10-CM

## 2018-05-13 DIAGNOSIS — I10 Essential (primary) hypertension: Secondary | ICD-10-CM

## 2018-05-14 ENCOUNTER — Encounter: Payer: Self-pay | Admitting: Internal Medicine

## 2018-05-14 MED ORDER — AMLODIPINE BESYLATE 2.5 MG PO TABS: 2.5000 mg | ORAL_TABLET | Freq: Every day | ORAL | 3 refills | Status: DC

## 2018-05-14 MED ORDER — BUPROPION HCL 75 MG PO TABS: 150.0000 mg | ORAL_TABLET | Freq: Two times a day (BID) | ORAL | 3 refills | Status: DC

## 2018-05-14 MED ORDER — ALPRAZOLAM 0.5 MG PO TABS: ORAL_TABLET | ORAL | 2 refills | Status: DC

## 2018-05-14 MED ORDER — OMEPRAZOLE 20 MG PO CPDR: 20.0000 mg | DELAYED_RELEASE_CAPSULE | Freq: Every day | ORAL | 3 refills | Status: DC

## 2018-05-14 MED ORDER — ESCITALOPRAM OXALATE 20 MG PO TABS: 20.0000 mg | ORAL_TABLET | Freq: Every day | ORAL | 3 refills | Status: DC

## 2018-05-14 NOTE — Telephone Encounter (Signed)
Glenda or Triage - -  Can you please call Ms. Branam and tell her that her Xanax was sent to [facility] and others were refilled to [facility].   Thank you  Gilles Chiquito, MD

## 2018-05-20 ENCOUNTER — Other Ambulatory Visit: Payer: Self-pay | Admitting: Internal Medicine

## 2018-05-20 DIAGNOSIS — F330 Major depressive disorder, recurrent, mild: Secondary | ICD-10-CM

## 2018-05-20 NOTE — Telephone Encounter (Signed)
Xanax rx called to [place].

## 2018-05-23 ENCOUNTER — Encounter: Payer: Self-pay | Admitting: Internal Medicine

## 2018-05-29 ENCOUNTER — Encounter: Payer: Self-pay | Admitting: Internal Medicine

## 2018-06-12 ENCOUNTER — Encounter: Payer: Self-pay | Admitting: Internal Medicine

## 2018-06-21 DIAGNOSIS — H43811 Vitreous degeneration, right eye: Secondary | ICD-10-CM | POA: Diagnosis not present

## 2018-06-21 DIAGNOSIS — H04123 Dry eye syndrome of bilateral lacrimal glands: Secondary | ICD-10-CM | POA: Diagnosis not present

## 2018-06-21 DIAGNOSIS — Z961 Presence of intraocular lens: Secondary | ICD-10-CM | POA: Diagnosis not present

## 2018-06-24 ENCOUNTER — Encounter: Payer: Self-pay | Admitting: Internal Medicine

## 2018-06-25 ENCOUNTER — Encounter: Payer: Self-pay | Admitting: Internal Medicine

## 2018-06-25 MED ORDER — ACETAMINOPHEN-CODEINE #3 300-30 MG PO TABS: 1.0000 | ORAL_TABLET | Freq: Three times a day (TID) | ORAL | 0 refills | Status: DC | PRN

## 2018-07-07 ENCOUNTER — Encounter: Payer: Self-pay | Admitting: Internal Medicine

## 2018-07-08 ENCOUNTER — Encounter: Payer: Self-pay | Admitting: Internal Medicine

## 2018-07-10 ENCOUNTER — Other Ambulatory Visit: Payer: Self-pay

## 2018-07-10 ENCOUNTER — Ambulatory Visit (INDEPENDENT_AMBULATORY_CARE_PROVIDER_SITE_OTHER): Payer: Medicare Other | Admitting: Internal Medicine

## 2018-07-10 DIAGNOSIS — G8929 Other chronic pain: Secondary | ICD-10-CM

## 2018-07-10 DIAGNOSIS — M159 Polyosteoarthritis, unspecified: Secondary | ICD-10-CM | POA: Diagnosis not present

## 2018-07-10 DIAGNOSIS — F339 Major depressive disorder, recurrent, unspecified: Secondary | ICD-10-CM | POA: Diagnosis not present

## 2018-07-10 DIAGNOSIS — F5104 Psychophysiologic insomnia: Secondary | ICD-10-CM

## 2018-07-10 DIAGNOSIS — Z79899 Other long term (current) drug therapy: Secondary | ICD-10-CM

## 2018-07-10 DIAGNOSIS — F331 Major depressive disorder, recurrent, moderate: Secondary | ICD-10-CM

## 2018-07-10 DIAGNOSIS — I10 Essential (primary) hypertension: Secondary | ICD-10-CM | POA: Diagnosis not present

## 2018-07-10 DIAGNOSIS — F419 Anxiety disorder, unspecified: Secondary | ICD-10-CM

## 2018-07-10 NOTE — Assessment & Plan Note (Signed)
>>  ASSESSMENT AND PLAN FOR OSTEOARTHRITIS OF MULTIPLE JOINTS WRITTEN ON 07/10/2018 11:33 AM BY MULLEN, EMILY B, MD  Continues to be an issue for her.  She is active with taking her dog out for walks.  Otherwise, she remains sedentary.  We have tried lidocaine gel.  I think given her uncontrolled depression, she is not a good candidate for opiates chronically.  She is taking daily advil.  She is aware of the risks of kidney damage and GI bleeding.   Plan Continue lidocaine gel, advised, as always, appropriate dosing of ibuprofen.

## 2018-07-10 NOTE — Patient Instructions (Signed)
Instructions given on phone.

## 2018-07-10 NOTE — Assessment & Plan Note (Signed)
Previously controlled on current regimen.  She is not checking her BP at home.  I advised her to continue her current single drug therapy of amlodipine.   Plan Continue amlodipine.

## 2018-07-10 NOTE — Progress Notes (Signed)
  [facility] Internal Medicine Residency Telephone Encounter Continuity Care Appointment  HPI:   This telephone encounter was created for Ms. Avory J Masaki on 07/10/2018 for the following purpose/cc: to follow up on chronic health issue.  Ms. Mruk preferred to do a telehealth visit due to concern around the coronavirus and she was willing to accept the risks of not coming in.  She reports that she is in good spirits. She and her dog, Mee Hives, are keeping to themselves and watching where they go.  She continues to take advil for pain, but otherwise is taking her other medications as prescribed.    She notes she has her chronic pain, chronic insomnia.  She lives in a senior living apartment complex and she is avoiding too much contact with her neighbors.  She does not need refills.   Past Medical History:  Past Medical History:  Diagnosis Date  . Anxiety   . Arthritis   . Depression   . GERD (gastroesophageal reflux disease)   . PONV (postoperative nausea and vomiting)       ROS:   Insomnia, chronic knee pain.     Assessment / Plan / Recommendations:   Please see A&P under problem oriented charting for assessment of the patient's acute and chronic medical conditions.   As always, pt is advised that if symptoms worsen or new symptoms arise, they should go to an urgent care facility or to to ER for further evaluation.   Consent and Medical Decision Making:   This is a telephone encounter between [name] and Gilles Chiquito on 07/10/2018 for follow up of HTN and anxiety. The visit was conducted with the patient located at home and Gilles Chiquito at [facility]. The patient's identity was confirmed using their DOB and current address. The patient has consented to being evaluated through a telephone encounter and understands the associated risks (an examination cannot be done and the patient may need to come in for an appointment) / benefits (allows the patient to remain at home,  decreasing exposure to coronavirus). I personally spent 10 minutes on medical discussion.    Follow up telehealth or in person visit in 2-3 months.  Mychart communication as needed.

## 2018-07-10 NOTE — Assessment & Plan Note (Signed)
She is in good spirits today.  She notes that she doesn't mind being on her own and she is social distancing and wearing masks appropriately.  She is taking her lexapro and wellbutrin.  We did not discuss her family situation today.   Plan Continue lexapro and wellbutrin.

## 2018-07-10 NOTE — Assessment & Plan Note (Signed)
Continues to be an issue for her.  She is active with taking her dog out for walks.  Otherwise, she remains sedentary.  We have tried lidocaine gel.  I think given her uncontrolled depression, she is not a good candidate for opiates chronically.  She is taking daily advil.  She is aware of the risks of kidney damage and GI bleeding.   Plan Continue lidocaine gel, advised, as always, appropriate dosing of ibuprofen.

## 2018-07-17 ENCOUNTER — Encounter: Payer: Self-pay | Admitting: Internal Medicine

## 2018-07-18 ENCOUNTER — Encounter: Payer: Self-pay | Admitting: Internal Medicine

## 2018-07-18 ENCOUNTER — Telehealth: Payer: Self-pay | Admitting: *Deleted

## 2018-07-18 MED ORDER — ACETAMINOPHEN-CODEINE #3 300-30 MG PO TABS: 1.0000 | ORAL_TABLET | Freq: Three times a day (TID) | ORAL | 0 refills | Status: DC | PRN

## 2018-07-18 NOTE — Telephone Encounter (Signed)
[facility] called for [facility] for benzo and pain med, gave icd codes for both, insurance approved. Pt may pick up

## 2018-08-07 ENCOUNTER — Other Ambulatory Visit: Payer: Self-pay | Admitting: Internal Medicine

## 2018-08-22 DIAGNOSIS — Z961 Presence of intraocular lens: Secondary | ICD-10-CM | POA: Diagnosis not present

## 2018-08-22 DIAGNOSIS — H04123 Dry eye syndrome of bilateral lacrimal glands: Secondary | ICD-10-CM | POA: Diagnosis not present

## 2018-08-22 DIAGNOSIS — H43811 Vitreous degeneration, right eye: Secondary | ICD-10-CM | POA: Diagnosis not present

## 2018-08-23 ENCOUNTER — Encounter: Payer: Self-pay | Admitting: Internal Medicine

## 2018-08-28 ENCOUNTER — Encounter: Payer: Self-pay | Admitting: Internal Medicine

## 2018-08-28 MED ORDER — BUPROPION HCL 75 MG PO TABS: 150.0000 mg | ORAL_TABLET | Freq: Two times a day (BID) | ORAL | 3 refills | Status: DC

## 2018-08-28 MED ORDER — ACETAMINOPHEN-CODEINE #3 300-30 MG PO TABS: 1.0000 | ORAL_TABLET | Freq: Three times a day (TID) | ORAL | 0 refills | Status: DC | PRN

## 2018-09-09 ENCOUNTER — Encounter: Payer: Self-pay | Admitting: Internal Medicine

## 2018-09-24 ENCOUNTER — Encounter: Payer: Self-pay | Admitting: Internal Medicine

## 2018-09-26 ENCOUNTER — Encounter: Payer: Self-pay | Admitting: Internal Medicine

## 2018-09-27 ENCOUNTER — Encounter: Payer: Self-pay | Admitting: Internal Medicine

## 2018-09-27 MED ORDER — ACETAMINOPHEN-CODEINE #3 300-30 MG PO TABS: 1.0000 | ORAL_TABLET | Freq: Three times a day (TID) | ORAL | 0 refills | Status: DC | PRN

## 2018-10-02 ENCOUNTER — Encounter: Payer: Self-pay | Admitting: Internal Medicine

## 2018-10-11 ENCOUNTER — Encounter: Payer: Medicare Other | Admitting: Internal Medicine

## 2018-10-14 ENCOUNTER — Encounter: Payer: Self-pay | Admitting: Internal Medicine

## 2018-10-20 ENCOUNTER — Encounter: Payer: Self-pay | Admitting: Internal Medicine

## 2018-10-21 MED ORDER — ACETAMINOPHEN-CODEINE #3 300-30 MG PO TABS: 1.0000 | ORAL_TABLET | Freq: Three times a day (TID) | ORAL | 0 refills | Status: DC | PRN

## 2018-10-22 ENCOUNTER — Encounter: Payer: Self-pay | Admitting: Internal Medicine

## 2018-11-14 ENCOUNTER — Encounter: Payer: Self-pay | Admitting: Internal Medicine

## 2018-11-15 MED ORDER — ACETAMINOPHEN-CODEINE #3 300-30 MG PO TABS: 1.0000 | ORAL_TABLET | Freq: Three times a day (TID) | ORAL | 0 refills | Status: DC | PRN

## 2018-11-25 ENCOUNTER — Telehealth: Payer: Self-pay | Admitting: Internal Medicine

## 2018-11-25 NOTE — Telephone Encounter (Signed)
Pt missed call, pls return call [phone]

## 2018-11-25 NOTE — Telephone Encounter (Signed)
Pt states she did not call the clinic

## 2018-11-28 ENCOUNTER — Encounter: Payer: Self-pay | Admitting: Internal Medicine

## 2018-11-28 DIAGNOSIS — F330 Major depressive disorder, recurrent, mild: Secondary | ICD-10-CM

## 2018-11-29 MED ORDER — ALPRAZOLAM 0.5 MG PO TABS: ORAL_TABLET | ORAL | 2 refills | Status: DC

## 2018-12-10 ENCOUNTER — Encounter: Payer: Self-pay | Admitting: Internal Medicine

## 2018-12-11 ENCOUNTER — Encounter: Payer: Self-pay | Admitting: Internal Medicine

## 2018-12-12 MED ORDER — ACETAMINOPHEN-CODEINE #3 300-30 MG PO TABS: 1.0000 | ORAL_TABLET | Freq: Three times a day (TID) | ORAL | 0 refills | Status: DC | PRN

## 2019-01-04 ENCOUNTER — Encounter: Payer: Self-pay | Admitting: Internal Medicine

## 2019-01-06 MED ORDER — BUPROPION HCL 75 MG PO TABS: 150.0000 mg | ORAL_TABLET | Freq: Two times a day (BID) | ORAL | 3 refills | Status: DC

## 2019-01-09 ENCOUNTER — Encounter: Payer: Self-pay | Admitting: Internal Medicine

## 2019-01-14 ENCOUNTER — Encounter: Payer: Self-pay | Admitting: Internal Medicine

## 2019-01-14 MED ORDER — ACETAMINOPHEN-CODEINE #3 300-30 MG PO TABS: 1.0000 | ORAL_TABLET | Freq: Three times a day (TID) | ORAL | 0 refills | Status: DC | PRN

## 2019-02-01 ENCOUNTER — Encounter: Payer: Self-pay | Admitting: Internal Medicine

## 2019-02-09 ENCOUNTER — Encounter: Payer: Self-pay | Admitting: Internal Medicine

## 2019-02-11 ENCOUNTER — Encounter: Payer: Self-pay | Admitting: Internal Medicine

## 2019-02-11 MED ORDER — ACETAMINOPHEN-CODEINE #3 300-30 MG PO TABS: 1.0000 | ORAL_TABLET | Freq: Three times a day (TID) | ORAL | 0 refills | Status: DC | PRN

## 2019-03-04 ENCOUNTER — Other Ambulatory Visit: Payer: Self-pay | Admitting: Internal Medicine

## 2019-03-04 DIAGNOSIS — F419 Anxiety disorder, unspecified: Secondary | ICD-10-CM

## 2019-03-04 DIAGNOSIS — K219 Gastro-esophageal reflux disease without esophagitis: Secondary | ICD-10-CM

## 2019-03-04 DIAGNOSIS — F3341 Major depressive disorder, recurrent, in partial remission: Secondary | ICD-10-CM

## 2019-03-04 DIAGNOSIS — I10 Essential (primary) hypertension: Secondary | ICD-10-CM

## 2019-03-07 ENCOUNTER — Other Ambulatory Visit: Payer: Self-pay | Admitting: Internal Medicine

## 2019-03-07 NOTE — Telephone Encounter (Signed)
Needs refill on acetaminophen-codeine (TYLENOL #3) 300-30 MG tablet ;pt contact [place]

## 2019-03-10 ENCOUNTER — Other Ambulatory Visit: Payer: Self-pay | Admitting: Internal Medicine

## 2019-03-10 NOTE — Telephone Encounter (Signed)
Pt is calling back regarding medicine [phone]

## 2019-03-13 MED ORDER — ACETAMINOPHEN-CODEINE #3 300-30 MG PO TABS: 1.0000 | ORAL_TABLET | Freq: Three times a day (TID) | ORAL | 0 refills | Status: DC | PRN

## 2019-03-26 ENCOUNTER — Other Ambulatory Visit: Payer: Self-pay | Admitting: Internal Medicine

## 2019-03-26 DIAGNOSIS — F330 Major depressive disorder, recurrent, mild: Secondary | ICD-10-CM

## 2019-03-26 NOTE — Telephone Encounter (Signed)
Refill Request   ALPRAZolam (XANAX) 0.5 MG tablet  [facility] [place], [place]

## 2019-03-27 ENCOUNTER — Other Ambulatory Visit: Payer: Self-pay | Admitting: Internal Medicine

## 2019-03-27 DIAGNOSIS — F330 Major depressive disorder, recurrent, mild: Secondary | ICD-10-CM

## 2019-03-27 MED ORDER — ALPRAZOLAM 0.5 MG PO TABS: ORAL_TABLET | ORAL | 2 refills | Status: DC

## 2019-03-27 NOTE — Telephone Encounter (Signed)
Pt is calling regarding medicine, pls contact patient [phone]

## 2019-04-07 ENCOUNTER — Other Ambulatory Visit: Payer: Self-pay | Admitting: Internal Medicine

## 2019-04-07 ENCOUNTER — Encounter: Payer: Self-pay | Admitting: Internal Medicine

## 2019-04-18 ENCOUNTER — Ambulatory Visit (INDEPENDENT_AMBULATORY_CARE_PROVIDER_SITE_OTHER): Payer: Medicare Other | Admitting: Internal Medicine

## 2019-04-18 ENCOUNTER — Other Ambulatory Visit: Payer: Self-pay

## 2019-04-18 ENCOUNTER — Encounter: Payer: Self-pay | Admitting: Internal Medicine

## 2019-04-18 ENCOUNTER — Ambulatory Visit (HOSPITAL_COMMUNITY)
Admission: RE | Admit: 2019-04-18 | Discharge: 2019-04-18 | Disposition: A | Discharge status: Home / Self Care | Payer: Medicare Other | Source: Ambulatory Visit | Attending: Internal Medicine | Admitting: Internal Medicine

## 2019-04-18 VITALS — BP 108/62 | HR 88 | Temp 99.5°F | Ht 61.0 in | Wt 189.0 lb

## 2019-04-18 DIAGNOSIS — M159 Polyosteoarthritis, unspecified: Secondary | ICD-10-CM | POA: Diagnosis not present

## 2019-04-18 DIAGNOSIS — F331 Major depressive disorder, recurrent, moderate: Secondary | ICD-10-CM

## 2019-04-18 DIAGNOSIS — I10 Essential (primary) hypertension: Secondary | ICD-10-CM | POA: Diagnosis not present

## 2019-04-18 DIAGNOSIS — F339 Major depressive disorder, recurrent, unspecified: Secondary | ICD-10-CM

## 2019-04-18 DIAGNOSIS — Z0000 Encounter for general adult medical examination without abnormal findings: Secondary | ICD-10-CM

## 2019-04-18 DIAGNOSIS — R079 Chest pain, unspecified: Secondary | ICD-10-CM

## 2019-04-18 DIAGNOSIS — R7303 Prediabetes: Secondary | ICD-10-CM

## 2019-04-18 DIAGNOSIS — Z66 Do not resuscitate: Secondary | ICD-10-CM

## 2019-04-18 DIAGNOSIS — F411 Generalized anxiety disorder: Secondary | ICD-10-CM

## 2019-04-18 DIAGNOSIS — E785 Hyperlipidemia, unspecified: Secondary | ICD-10-CM

## 2019-04-18 DIAGNOSIS — R072 Precordial pain: Secondary | ICD-10-CM | POA: Insufficient documentation

## 2019-04-18 DIAGNOSIS — E559 Vitamin D deficiency, unspecified: Secondary | ICD-10-CM | POA: Insufficient documentation

## 2019-04-18 DIAGNOSIS — Z789 Other specified health status: Secondary | ICD-10-CM | POA: Diagnosis not present

## 2019-04-18 DIAGNOSIS — Z79899 Other long term (current) drug therapy: Secondary | ICD-10-CM

## 2019-04-18 DIAGNOSIS — K219 Gastro-esophageal reflux disease without esophagitis: Secondary | ICD-10-CM

## 2019-04-18 LAB — GLUCOSE, CAPILLARY: Glucose-Capillary: 108 mg/dL — ABNORMAL HIGH (ref 70–99)

## 2019-04-18 LAB — POCT GLYCOSYLATED HEMOGLOBIN (HGB A1C): Hemoglobin A1C: 5.4 % (ref 4.0–5.6)

## 2019-04-18 NOTE — Assessment & Plan Note (Signed)
EKG appeared fine.  Pain started at rest and improved with deep breathing which would make it atypical.  I advised her to call the clinic if recurs.  If worsens or sustains, call 911.

## 2019-04-18 NOTE — Assessment & Plan Note (Signed)
>>  ASSESSMENT AND PLAN FOR OSTEOARTHRITIS OF MULTIPLE JOINTS WRITTEN ON 04/18/2019  1:19 PM BY MULLEN, EMILY B, MD  She is doing okay on current therapy, tylenol #3.  She takes 2 at night, but feels groggy in the morning.  Overall it helps her sleep.

## 2019-04-18 NOTE — Assessment & Plan Note (Signed)
She is doing well on omeprazole.  She has no issues with GERD, however, she did have a chest pain event with lying down which improved with sitting up and deep breathing.  EKG appeared okay.  She had not had anything to eat.  She may have been having reflux, however, she is at risk for cardiac chest pain.    Plan Continue prilosec.

## 2019-04-18 NOTE — Assessment & Plan Note (Signed)
She is doing okay on current therapy, tylenol #3.  She takes 2 at night, but feels groggy in the morning.  Overall it helps her sleep.

## 2019-04-18 NOTE — Assessment & Plan Note (Signed)
BP is well controlled today.  She is taking her amlodipine and doing well with it.  She has chronic pain and does seem to be taking a high level of NSAIDs which persists.    Plan Continue Amlodipine CMET today

## 2019-04-18 NOTE — Patient Instructions (Signed)
Ms. Chidester - -  It was a pleasure to see you.   Please keep taking your medications as you are.   Please come back to see me in 4 months.    If your chest pain comes back, worsens or doesn't go away, please call 911.    Angina  Angina is very bad discomfort or pain in the chest, neck, arm, jaw, or back. The discomfort is caused by a lack of blood in the middle layer of the heart wall (myocardium). What are the causes? This condition is caused by a buildup of fat and cholesterol (plaque) in your arteries (atherosclerosis). This buildup narrows the arteries and makes it hard for blood to flow. What increases the risk? You are more likely to develop this condition if:  You have high levels of cholesterol in your blood.  You have high blood pressure (hypertension).  You have diabetes.  You have a family history of heart disease.  You are not active, or you do not exercise enough.  You feel sad (depressed).  You have been treated with high energy rays (radiation) on the left side of your chest. Other risk factors are:  Using tobacco.  Being very overweight (obese).  Eating a diet high in unhealthy fats (saturated fats).  Having stress, or being exposed to things that cause stress.  Using drugs, such as cocaine. Women have a greater risk for angina if:  They are older than 87.  They have stopped having their period (are in postmenopause). What are the signs or symptoms? Common symptoms of this condition in both men and women may include:  Chest pain, which may: ? Feel like a crushing or squeezing in the chest. ? Feel like a tightness, pressure, fullness, or heaviness in the chest. ? Last for more than a few minutes at a time. ? Stop and come back (recur) after a few minutes.  Pain in the neck, arm, jaw, or back.  Heartburn or upset stomach (indigestion) for no reason.  Being short of breath.  Feeling sick to your stomach (nauseous).  Sudden cold  sweats. Women and people with diabetes may have other symptoms that are not usual, such as feeling:  Tired (fatigue).  Worried or nervous (anxious) for no reason.  Weak for no reason.  Dizzy or passing out (fainting). How is this treated? This condition may be treated with:  Medicines. These are given to: ? Prevent blood clots. ? Prevent heart attack. ? Relax blood vessels and improve blood flow to the heart (nitrates). ? Reduce blood pressure. ? Improve the pumping action of the heart. ? Reduce fat and cholesterol in the blood.  A procedure to widen a narrowed or blocked artery in the heart (angioplasty).  Surgery to allow blood to go around a blocked artery (coronary artery bypass surgery). Follow these instructions at home: Medicines  Take over-the-counter and prescription medicines only as told by your doctor.  Do not take these medicines unless your doctor says that you can: ? NSAIDs. These include:  Ibuprofen.  Naproxen. ? Vitamin supplements that have vitamin A, vitamin E, or both. ? Hormone therapy that contains estrogen with or without progestin. Eating and drinking   Eat a heart-healthy diet that includes: ? Lots of fresh fruits and vegetables. ? Whole grains. ? Low-fat (lean) protein. ? Low-fat dairy products.  Follow instructions from your doctor about what you cannot eat or drink. Activity  Follow an exercise program that your doctor tells you.  Talk with  your doctor about joining a program to help improve the health of your heart (cardiac rehab).  When you feel tired, take a break. Plan breaks if you know you are going to feel tired. Lifestyle   Do not use any products that contain nicotine or tobacco. This includes cigarettes, e-cigarettes, and chewing tobacco. If you need help quitting, ask your doctor.  If your doctor says you can drink alcohol: ? Limit how much you use to:  0-1 drink a day for women who are not pregnant.  0-2 drinks a  day for men. ? Be aware of how much alcohol is in your drink. In the [place] one drink equals:  One 12 oz bottle of beer (355 mL).  One 5 oz glass of wine (148 mL).  One 1 oz glass of hard liquor (44 mL). General instructions  Stay at a healthy weight. If your doctor tells you to do so, work with him or her to lose weight.  Learn to deal with stress. If you need help, ask your doctor.  Keep your vaccines up to date. Get a flu shot every year.  Talk with your doctor if you feel sad. Take a screening test to see if you are at risk for depression.  Work with your doctor to manage any other health problems that you have. These may include diabetes or high blood pressure.  Keep all follow-up visits as told by your doctor. This is important. Get help right away if:  You have pain in your chest, neck, arm, jaw, or back, and the pain: ? Lasts more than a few minutes. ? Comes back. ? Does not get better after you take medicine under your tongue (sublingual nitroglycerin). ? Keeps getting worse. ? Comes more often.  You have any of these problems for no reason: ? Sweating a lot. ? Heartburn or upset stomach. ? Shortness of breath. ? Trouble breathing. ? Feeling sick to your stomach. ? Throwing up (vomiting). ? Feeling more tired than normal. ? Feeling nervous or worrying more than normal. ? Weakness.  You are suddenly dizzy or light-headed.  You pass out. These symptoms may be an emergency. Do not wait to see if the symptoms will go away. Get medical help right away. Call your local emergency services (911 in the [place] Do not drive yourself to the hospital. Summary  Angina is very bad discomfort or pain in the chest, neck, arm, neck, or back.  Symptoms include chest pain, heartburn or upset stomach for no reason, and shortness of breath.  Women or people with diabetes may have symptoms that are not usual, such as feeling nervous or worried for no reason, weak for no reason,  or tired.  Take all medicines only as told by your doctor.  You should eat a heart-healthy diet and follow an exercise program. This information is not intended to replace advice given to you by your health care provider. Make sure you discuss any questions you have with your health care provider. Document Revised: 08/27/2017 Document Reviewed: 08/27/2017 Elsevier Patient Education  [place].

## 2019-04-18 NOTE — Progress Notes (Signed)
   Subjective:    Patient ID: [name], female    DOB: 1950/08/19, 69 y.o.   MRN: [id number]  CC: 1 year visit for HTN, HLD, OA, GAD, GERD, pre-diabetes  HPI   [name] is a 69 year old woman with PMH of HTN, HLD, GAD, MDD, GERD, OA who presents for follow up.   [name] reports that she had left sided chest pain last night at rest, which moved up into her shoulder.  She took some deep breaths and it went away.  She did not call EMS, but did call her sister in law.  She had no nausea or diaphoresis.  Pain went away on its own and has not recurred.   She is requesting to be DNR.  She would not want chest compressions or to be on a ventilator.  She has made arrangements with a cremation center for her final needs.  She notes she has a brother and sister in law in town, but she has not spoken recently to her daughter or her grandchildren.  She would like to have a gold DNR sheet filled out today.   We further discussed her current living situation, her financial situation.  She is not interested at this time in referral to case management.  She has declined mammogram and colonoscopy.   Review of Systems  Constitutional: Negative for activity change, appetite change and fatigue.  Respiratory: Positive for chest tightness. Negative for cough and shortness of breath.   Cardiovascular: Positive for chest pain. Negative for palpitations and leg swelling.  Gastrointestinal: Negative for diarrhea.  Genitourinary: Negative for difficulty urinating.  Musculoskeletal: Positive for arthralgias and back pain. Negative for gait problem.  Neurological: Negative for dizziness and weakness.  Psychiatric/Behavioral: Positive for decreased concentration and dysphoric mood.       Objective:   Physical Exam Vitals and nursing note reviewed.  Constitutional:      General: She is not in acute distress.    Appearance: Normal appearance. She is not toxic-appearing.  HENT:     Head:  Normocephalic and atraumatic.     Right Ear: Tympanic membrane normal. There is no impacted cerumen.     Left Ear: Tympanic membrane normal. There is no impacted cerumen.  Cardiovascular:     Rate and Rhythm: Normal rate and regular rhythm.     Heart sounds: No murmur.  Pulmonary:     Effort: Pulmonary effort is normal. No respiratory distress.     Breath sounds: Normal breath sounds.  Musculoskeletal:        General: No swelling or tenderness.  Neurological:     General: No focal deficit present.     Mental Status: She is alert and oriented to person, place, and time.  Psychiatric:        Mood and Affect: Mood normal.        Behavior: Behavior normal.     EKG done today showed some artifacts, no PW issues, No STE or TW changes.   A1C today was normal  Vitamin D, CMET, CBC, HCV were drawn today.       Assessment & Plan:  RTC in 4 months unless needed sooner.

## 2019-04-18 NOTE — Assessment & Plan Note (Signed)
She is taking lexapro and effexor along with intermittent xanax.  Her PHQ-9 remains high.  She has limited interaction with her daughter and grandchildren which lead to a lot of her depressive symptoms.  We only discussed briefly today.

## 2019-04-18 NOTE — Assessment & Plan Note (Signed)
Last checked was 24, which is mildly low.  She is on a vegetarian and at times vegan diet.  Will check again today to make sure not significantly lower.

## 2019-04-18 NOTE — Assessment & Plan Note (Signed)
She has refused mammogram and colonoscopy today.  She has requested a DNR form.  I do believe she has the capacity to make this decision.  I advised her to post the form in her home if she is worried about inadvertent attempts at resuscitation.

## 2019-04-19 ENCOUNTER — Encounter: Payer: Self-pay | Admitting: Internal Medicine

## 2019-04-19 LAB — CBC
Hematocrit: 39.4 % (ref 34.0–46.6)
Hemoglobin: 13.2 g/dL (ref 11.1–15.9)
MCH: 29.3 pg (ref 26.6–33.0)
MCHC: 33.5 g/dL (ref 31.5–35.7)
MCV: 88 fL (ref 79–97)
Platelets: 266 10*3/uL (ref 150–450)
RBC: 4.5 x10E6/uL (ref 3.77–5.28)
RDW: 13.7 % (ref 11.7–15.4)
WBC: 8.8 10*3/uL (ref 3.4–10.8)

## 2019-04-19 LAB — CMP14 + ANION GAP
ALT: 22 IU/L (ref 0–32)
AST: 26 IU/L (ref 0–40)
Albumin/Globulin Ratio: 1.5 (ref 1.2–2.2)
Albumin: 4.3 g/dL (ref 3.8–4.8)
Alkaline Phosphatase: 137 IU/L — ABNORMAL HIGH (ref 39–117)
Anion Gap: 18 mmol/L (ref 10.0–18.0)
BUN/Creatinine Ratio: 13 (ref 12–28)
BUN: 16 mg/dL (ref 8–27)
Bilirubin Total: 0.4 mg/dL (ref 0.0–1.2)
CO2: 20 mmol/L (ref 20–29)
Calcium: 10.1 mg/dL (ref 8.7–10.3)
Chloride: 101 mmol/L (ref 96–106)
Creatinine, Ser: 1.21 mg/dL — ABNORMAL HIGH (ref 0.57–1.00)
GFR calc Af Amer: 52 mL/min/{1.73_m2} — ABNORMAL LOW (ref >59)
GFR calc non Af Amer: 45 mL/min/{1.73_m2} — ABNORMAL LOW (ref >59)
Globulin, Total: 2.8 g/dL (ref 1.5–4.5)
Glucose: 92 mg/dL (ref 65–99)
Potassium: 3.5 mmol/L (ref 3.5–5.2)
Sodium: 139 mmol/L (ref 134–144)
Total Protein: 7.1 g/dL (ref 6.0–8.5)

## 2019-04-19 LAB — HEPATITIS C ANTIBODY: Hep C Virus Ab: <0.1 s/co ratio (ref 0.0–0.9)

## 2019-04-19 LAB — VITAMIN D 25 HYDROXY (VIT D DEFICIENCY, FRACTURES): Vit D, 25-Hydroxy: 18.9 ng/mL — ABNORMAL LOW (ref 30.0–100.0)

## 2019-04-21 ENCOUNTER — Telehealth: Payer: Self-pay | Admitting: Internal Medicine

## 2019-04-21 NOTE — Telephone Encounter (Signed)
I called a second time and was able to speak to [name].  She is taking 6-8 ibuprofen every day for her back pain and has been for years.  We discussed that is the most likely trigger for her elevated creatinine.  She is going to try Tylenol but states Tylenol typically does not work well.  I explained there are other pharmaceuticals, like duloxetine, but that would require a conversation between the patient and Dr. Daryll Drown.  The patient was concerned her diet Pepsi intake was causing her renal dysfunction and I reassured her it was highly unlikely.  We also discussed that her LDL was not at goal but that she can further discuss it with Dr. Daryll Drown.  We also discussed that her vitamin D was low and she will get over-the-counter vitamin D 1000 IU and take daily.

## 2019-04-21 NOTE — Telephone Encounter (Addendum)
Called to go over lab results. Cr up but 3 yrs since most recent. Stop NSIAD and recheck.

## 2019-05-01 ENCOUNTER — Encounter: Payer: Self-pay | Admitting: Internal Medicine

## 2019-05-02 ENCOUNTER — Encounter: Payer: Self-pay | Admitting: Internal Medicine

## 2019-05-08 ENCOUNTER — Other Ambulatory Visit: Payer: Self-pay | Admitting: Internal Medicine

## 2019-05-08 ENCOUNTER — Encounter: Payer: Self-pay | Admitting: Internal Medicine

## 2019-05-09 ENCOUNTER — Encounter: Payer: Self-pay | Admitting: Internal Medicine

## 2019-05-09 MED ORDER — BACLOFEN 10 MG PO TABS: 10.0000 mg | ORAL_TABLET | Freq: Every day | ORAL | 1 refills | Status: DC | PRN

## 2019-05-09 MED ORDER — ACETAMINOPHEN-CODEINE #3 300-30 MG PO TABS: ORAL_TABLET | ORAL | 0 refills | Status: DC

## 2019-05-12 ENCOUNTER — Encounter: Payer: Self-pay | Admitting: Internal Medicine

## 2019-05-21 ENCOUNTER — Other Ambulatory Visit: Payer: Self-pay | Admitting: Internal Medicine

## 2019-05-21 ENCOUNTER — Encounter: Payer: Self-pay | Admitting: Internal Medicine

## 2019-05-22 ENCOUNTER — Other Ambulatory Visit: Payer: Self-pay | Admitting: Internal Medicine

## 2019-05-22 DIAGNOSIS — N289 Disorder of kidney and ureter, unspecified: Secondary | ICD-10-CM

## 2019-05-23 ENCOUNTER — Other Ambulatory Visit: Payer: Medicare Other

## 2019-05-24 ENCOUNTER — Encounter: Payer: Self-pay | Admitting: Internal Medicine

## 2019-05-27 ENCOUNTER — Encounter: Payer: Self-pay | Admitting: Internal Medicine

## 2019-05-28 ENCOUNTER — Other Ambulatory Visit (INDEPENDENT_AMBULATORY_CARE_PROVIDER_SITE_OTHER): Payer: Medicare Other

## 2019-05-28 DIAGNOSIS — N289 Disorder of kidney and ureter, unspecified: Secondary | ICD-10-CM

## 2019-05-29 ENCOUNTER — Encounter: Payer: Self-pay | Admitting: Internal Medicine

## 2019-05-29 ENCOUNTER — Telehealth: Payer: Self-pay | Admitting: Internal Medicine

## 2019-05-29 LAB — CMP14 + ANION GAP
ALT: 19 IU/L (ref 0–32)
AST: 20 IU/L (ref 0–40)
Albumin/Globulin Ratio: 1.6 (ref 1.2–2.2)
Albumin: 4.5 g/dL (ref 3.8–4.8)
Alkaline Phosphatase: 136 IU/L — ABNORMAL HIGH (ref 39–117)
Anion Gap: 17 mmol/L (ref 10.0–18.0)
BUN/Creatinine Ratio: 10 — ABNORMAL LOW (ref 12–28)
BUN: 9 mg/dL (ref 8–27)
Bilirubin Total: 0.3 mg/dL (ref 0.0–1.2)
CO2: 22 mmol/L (ref 20–29)
Calcium: 10.2 mg/dL (ref 8.7–10.3)
Chloride: 105 mmol/L (ref 96–106)
Creatinine, Ser: 0.88 mg/dL (ref 0.57–1.00)
GFR calc Af Amer: 77 mL/min/{1.73_m2} (ref >59)
GFR calc non Af Amer: 67 mL/min/{1.73_m2} (ref >59)
Globulin, Total: 2.8 g/dL (ref 1.5–4.5)
Glucose: 97 mg/dL (ref 65–99)
Potassium: 4.9 mmol/L (ref 3.5–5.2)
Sodium: 144 mmol/L (ref 134–144)
Total Protein: 7.3 g/dL (ref 6.0–8.5)

## 2019-05-29 NOTE — Telephone Encounter (Signed)
Pt is calling regarding a mychart message for labs, pt said she had labs done the day she was here. p [phone]

## 2019-05-29 NOTE — Telephone Encounter (Signed)
Dr Daryll Drown, please call pt w/ results, she said she forgot and just needs her results

## 2019-05-30 ENCOUNTER — Encounter: Payer: Self-pay | Admitting: Internal Medicine

## 2019-05-30 NOTE — Telephone Encounter (Signed)
I sent her a message and asked for a good time to call her, fyi.  Thanks!

## 2019-06-08 ENCOUNTER — Other Ambulatory Visit: Payer: Self-pay | Admitting: Internal Medicine

## 2019-06-08 ENCOUNTER — Encounter: Payer: Self-pay | Admitting: Internal Medicine

## 2019-06-09 ENCOUNTER — Encounter: Payer: Self-pay | Admitting: Internal Medicine

## 2019-06-10 ENCOUNTER — Other Ambulatory Visit: Payer: Self-pay | Admitting: *Deleted

## 2019-06-12 ENCOUNTER — Encounter: Payer: Self-pay | Admitting: Internal Medicine

## 2019-06-25 ENCOUNTER — Encounter: Payer: Self-pay | Admitting: Internal Medicine

## 2019-07-01 ENCOUNTER — Other Ambulatory Visit: Payer: Self-pay | Admitting: Internal Medicine

## 2019-07-01 DIAGNOSIS — F330 Major depressive disorder, recurrent, mild: Secondary | ICD-10-CM

## 2019-07-12 ENCOUNTER — Other Ambulatory Visit: Payer: Self-pay | Admitting: Internal Medicine

## 2019-07-13 ENCOUNTER — Other Ambulatory Visit: Payer: Self-pay | Admitting: Internal Medicine

## 2019-07-29 ENCOUNTER — Telehealth: Payer: Self-pay | Admitting: *Deleted

## 2019-07-30 ENCOUNTER — Encounter: Payer: Self-pay | Admitting: Internal Medicine

## 2019-07-30 DIAGNOSIS — F330 Major depressive disorder, recurrent, mild: Secondary | ICD-10-CM

## 2019-07-30 MED ORDER — ALPRAZOLAM 0.5 MG PO TABS: ORAL_TABLET | ORAL | 0 refills | Status: DC

## 2019-07-30 NOTE — Telephone Encounter (Signed)
Sent message

## 2019-08-05 ENCOUNTER — Ambulatory Visit: Payer: Medicare Other | Admitting: Internal Medicine

## 2019-08-05 ENCOUNTER — Other Ambulatory Visit: Payer: Self-pay

## 2019-08-05 DIAGNOSIS — I10 Essential (primary) hypertension: Secondary | ICD-10-CM

## 2019-08-05 NOTE — Progress Notes (Signed)
  [facility] Internal Medicine Residency Telephone Encounter Continuity Care Appointment  HPI:   This telephone encounter was created for [name] on 08/05/2019 for the following purpose/cc discuss blood work.  She was concerned about her renal function and we discussed that this was improved from March.  She was also concerned about her ALP which we discussed is a chronic change and something we will monitor.    Past Medical History:  Past Medical History:  Diagnosis Date  . Anxiety   . Arthritis   . Depression   . GERD (gastroesophageal reflux disease)   . PONV (postoperative nausea and vomiting)       ROS:   Depressive symptoms, back pain.    Assessment / Plan / Recommendations:   Please see A&P under problem oriented charting for assessment of the patient's acute and chronic medical conditions.   As always, pt is advised that if symptoms worsen or new symptoms arise, they should go to an urgent care facility or to to ER for further evaluation.   Consent and Medical Decision Making:    This is a telephone encounter between [name] and [name] on 08/05/2019 for follow up of blood work. The visit was conducted with the patient located at home and [name] at [facility]. The patient's identity was confirmed using their DOB and current address. The patient has consented to being evaluated through a telephone encounter and understands the associated risks (an examination cannot be done and the patient may need to come in for an appointment) / benefits (allows the patient to remain at home, decreasing exposure to coronavirus). I personally spent 10 minutes on medical discussion.   Follow up as planned.

## 2019-08-06 ENCOUNTER — Telehealth: Payer: Self-pay

## 2019-08-06 NOTE — Telephone Encounter (Signed)
acetaminophen-codeine (TYLENOL #3) 300-30 MG tablet, REFILL REQUEST @  [facility] - [place], [place] - [address] Phone:  [phone]  Fax:  [phone]      

## 2019-08-07 MED ORDER — ACETAMINOPHEN-CODEINE #3 300-30 MG PO TABS: 1.0000 | ORAL_TABLET | Freq: Three times a day (TID) | ORAL | 0 refills | Status: DC | PRN

## 2019-08-28 ENCOUNTER — Telehealth: Payer: Self-pay | Admitting: Internal Medicine

## 2019-08-28 ENCOUNTER — Other Ambulatory Visit: Payer: Self-pay | Admitting: Internal Medicine

## 2019-08-28 NOTE — Telephone Encounter (Signed)
Stated she injured her back (unsure what happened); it hurts to walk,sit,stand, etc and she had to take more than usual of tylenol #3. Stated OTC med are not helping; also tried heat. Informed rx for Tylenol #3 was refilled.

## 2019-08-28 NOTE — Telephone Encounter (Signed)
Refill Request- pt requesting a call back   acetaminophen-codeine (TYLENOL #3) 300-30 MG tablet  [facility] [place], [place]

## 2019-09-05 ENCOUNTER — Ambulatory Visit (INDEPENDENT_AMBULATORY_CARE_PROVIDER_SITE_OTHER): Payer: Medicare Other | Admitting: Internal Medicine

## 2019-09-05 ENCOUNTER — Encounter: Payer: Self-pay | Admitting: Internal Medicine

## 2019-09-05 ENCOUNTER — Ambulatory Visit (HOSPITAL_COMMUNITY)
Admission: RE | Admit: 2019-09-05 | Discharge: 2019-09-05 | Disposition: A | Discharge status: Home / Self Care | Payer: Medicare Other | Source: Ambulatory Visit | Attending: Internal Medicine | Admitting: Internal Medicine

## 2019-09-05 ENCOUNTER — Other Ambulatory Visit: Payer: Self-pay

## 2019-09-05 ENCOUNTER — Encounter (HOSPITAL_COMMUNITY): Payer: Self-pay

## 2019-09-05 VITALS — BP 139/89 | HR 86 | Temp 98.2°F | Ht 61.0 in | Wt 187.9 lb

## 2019-09-05 DIAGNOSIS — S0081XA Abrasion of other part of head, initial encounter: Secondary | ICD-10-CM | POA: Diagnosis not present

## 2019-09-05 DIAGNOSIS — S0993XA Unspecified injury of face, initial encounter: Secondary | ICD-10-CM | POA: Insufficient documentation

## 2019-09-05 DIAGNOSIS — J320 Chronic maxillary sinusitis: Secondary | ICD-10-CM | POA: Diagnosis not present

## 2019-09-05 DIAGNOSIS — J3489 Other specified disorders of nose and nasal sinuses: Secondary | ICD-10-CM | POA: Diagnosis not present

## 2019-09-05 DIAGNOSIS — J0100 Acute maxillary sinusitis, unspecified: Secondary | ICD-10-CM | POA: Diagnosis not present

## 2019-09-05 NOTE — Patient Instructions (Addendum)
Thank you for allowing [place] to provide your care today. Today you came in due to pain and bruise around your eyes after you hit your forehead.   I order a scan for you to make sure there is no fracture. I will you with the result.  Please come back to clinic to follow up with Dr. Daryll Drown within next month or earlier at [facility] if your symptoms get worse or not improved. As always, if having severe symptoms such as sever headache, bleeding, nausea or vomiting, vision change,... please seek medical attention at emergency room. Should you have any questions or concerns please call the internal medicine clinic at [phone].    Thank you!

## 2019-09-05 NOTE — Progress Notes (Signed)
   CC: Swelling and mild pain around eyes secondary to trauma  HPI:  [name] is a 69 y.o. female with PMHx as documented below, presented with swelling, bruise and mild pain on her forehead and under her eyes after she fell on the floor from her bed in hit her head by angio table in the wastebasket about 30 hours ago. Please refer to problem based charting for further details and assessment and plan of current problem and chronic medical conditions.   Past Medical History:  Diagnosis Date  . Anxiety   . Arthritis   . Depression   . GERD (gastroesophageal reflux disease)   . PONV (postoperative nausea and vomiting)    Review of Systems:  .Negative except as mentioned in HPI and assessment and plan.   Physical Exam:  Vitals:   09/05/19 1043  BP: 139/89  Pulse: 86  Temp: 98.2 F (36.8 C)  TempSrc: Oral  SpO2: 98%  Weight: 187 lb 14.4 oz (85.2 kg)  Height: 5\' 1"  (1.549 m)   Constitutional: Well-developed and well-nourished. No acute distress.  HENT:  Head: Abrasion on forehead, no active bleeding. Mild to moderate tenderness of forehead and glabella and medial part of area under her eyes Eyes: Bruise under eyes, No gross vision change. Questionable mild limitation of upper EOM of both eyes? Cardiovascular:  RRR, nl S1S2, no murmur,  no LEE Respiratory: Effort normal and breath sounds normal. No respiratory distress. No wheezes.  Neurological: Is alert and oriented x 3, sensation of face is intact, no facial weakness.     Assessment & Plan:   See Encounters Tab for problem based charting.  Patient discussed with Dr. Heber [place]

## 2019-09-05 NOTE — Assessment & Plan Note (Addendum)
Patient fell from her bed when she rolled in her bed during sleep. She fell down on the floor and meanwhile, hit her forehead by the edge of the table and a wastebasket that was next to her bed. This happened 2 night ago. She had minor bleeding from the abrasion on her forehead, she did not notice any laceration. also reports some headache and pain around her eyes and nose that got better today. No loss of consciousness. No nausea or vomiting. No bleeding form her nose and no otorrhagia. Patient is NOT on anticoagulation.  On exam, she has an abrasion on her forehead w/o active bleeding. (picture in chart) Bruise under her eyes (partial raccoon eye) Mild to moderate tenderness of forehead around her glabella and on her lateral of her nose. No gross vision change. Questionable mild limitation of upper EOM of both eyes?   -Will send patient for maxillofacial CT scan to rule out any Fx.

## 2019-09-07 ENCOUNTER — Other Ambulatory Visit: Payer: Self-pay | Admitting: Internal Medicine

## 2019-09-07 DIAGNOSIS — F330 Major depressive disorder, recurrent, mild: Secondary | ICD-10-CM

## 2019-09-08 NOTE — Progress Notes (Signed)
Internal Medicine Clinic Attending  Case discussed with Dr. Masoudi  At the time of the visit.  We reviewed the resident's history and exam and pertinent patient test results.  I agree with the assessment, diagnosis, and plan of care documented in the resident's note.  

## 2019-09-12 ENCOUNTER — Other Ambulatory Visit: Payer: Self-pay | Admitting: Internal Medicine

## 2019-09-17 ENCOUNTER — Other Ambulatory Visit: Payer: Self-pay | Admitting: Internal Medicine

## 2019-09-17 NOTE — Telephone Encounter (Signed)
Need refill on acetaminophen-codeine (TYLENOL #3) 300-30 MG tablet ;pt contact [place], [place]

## 2019-09-17 NOTE — Telephone Encounter (Signed)
This Rx was sent today to [facility]. Hubbard Hartshorn, BSN, RN-BC  '

## 2019-09-22 ENCOUNTER — Telehealth: Payer: Self-pay | Admitting: Internal Medicine

## 2019-09-22 DIAGNOSIS — M25551 Pain in right hip: Secondary | ICD-10-CM

## 2019-09-22 DIAGNOSIS — M25561 Pain in right knee: Secondary | ICD-10-CM

## 2019-09-22 NOTE — Telephone Encounter (Signed)
I am on inpatient, will attempt to call her tomorrow.   Gilles Chiquito, MD

## 2019-09-22 NOTE — Telephone Encounter (Signed)
Pt is having trouble with her foot, pt wants to know if Dr Daryll Drown can call [phone]

## 2019-09-25 NOTE — Telephone Encounter (Signed)
Called and spoke with Ms. Harbuck.  She is having persistent right hip and knee pain after her fall earlier this month.  She is having clicking and sometimes catching of the right knee, no swelling.  She feels she may have "done something."  Given her pain, I am concerned for occult fracture.  She is willing to come in tomorrow for some xrays.   I have ordered hip and knee xrays on the right for her to get tomorrow.   Gilles Chiquito, MD

## 2019-09-25 NOTE — Telephone Encounter (Signed)
Pt is giving back, pls contact [phone]

## 2019-09-26 ENCOUNTER — Other Ambulatory Visit: Payer: Self-pay

## 2019-09-26 ENCOUNTER — Ambulatory Visit (HOSPITAL_COMMUNITY)
Admission: RE | Admit: 2019-09-26 | Discharge: 2019-09-26 | Disposition: A | Discharge status: Home / Self Care | Payer: Medicare Other | Source: Ambulatory Visit | Attending: Internal Medicine | Admitting: Internal Medicine

## 2019-09-26 DIAGNOSIS — M1711 Unilateral primary osteoarthritis, right knee: Secondary | ICD-10-CM | POA: Diagnosis not present

## 2019-09-26 DIAGNOSIS — Z043 Encounter for examination and observation following other accident: Secondary | ICD-10-CM | POA: Diagnosis not present

## 2019-09-26 DIAGNOSIS — M25551 Pain in right hip: Secondary | ICD-10-CM | POA: Insufficient documentation

## 2019-09-26 DIAGNOSIS — M25561 Pain in right knee: Secondary | ICD-10-CM | POA: Insufficient documentation

## 2019-09-26 DIAGNOSIS — M47816 Spondylosis without myelopathy or radiculopathy, lumbar region: Secondary | ICD-10-CM | POA: Diagnosis not present

## 2019-09-26 DIAGNOSIS — M11261 Other chondrocalcinosis, right knee: Secondary | ICD-10-CM | POA: Diagnosis not present

## 2019-09-28 ENCOUNTER — Encounter: Payer: Self-pay | Admitting: Internal Medicine

## 2019-10-02 ENCOUNTER — Other Ambulatory Visit: Payer: Self-pay | Admitting: Internal Medicine

## 2019-10-03 ENCOUNTER — Telehealth: Payer: Self-pay | Admitting: Internal Medicine

## 2019-10-03 NOTE — Telephone Encounter (Signed)
Attempted to call patient.  Left VM for her to call clinic back.  Send Mychart message in response to her reaching out.  Gilles Chiquito, MD

## 2019-10-09 ENCOUNTER — Other Ambulatory Visit: Payer: Self-pay | Admitting: Internal Medicine

## 2019-10-09 DIAGNOSIS — F330 Major depressive disorder, recurrent, mild: Secondary | ICD-10-CM

## 2019-10-21 ENCOUNTER — Telehealth: Payer: Self-pay | Admitting: *Deleted

## 2019-10-21 NOTE — Telephone Encounter (Signed)
Patient sent a MyChart message today:  Dix, Zanai J to Sid Falcon, MD     1:30 AM Dr. Daryll Drown, I don't want anymore refills on the two RX's I was getting.   So no need to try to figure out the dosage on either one.   Thanks anyway!  If I fall again, I won't mention it .  I still have my ibuprofen and it doesn't cost as must as x-rays   It would not send. Hubbard Hartshorn, BSN, RN-BC

## 2019-10-21 NOTE — Telephone Encounter (Signed)
I do not see any note attached.  Please forward to me when complete.

## 2019-10-21 NOTE — Telephone Encounter (Signed)
Okay, thank you Lauren.    Can you get her on for a TH with me in the next few weeks if she doesn't have an appointment?   Thanks!

## 2019-10-29 ENCOUNTER — Ambulatory Visit (INDEPENDENT_AMBULATORY_CARE_PROVIDER_SITE_OTHER): Payer: Medicare Other | Admitting: Internal Medicine

## 2019-10-29 ENCOUNTER — Other Ambulatory Visit: Payer: Self-pay

## 2019-10-29 DIAGNOSIS — F331 Major depressive disorder, recurrent, moderate: Secondary | ICD-10-CM

## 2019-10-29 DIAGNOSIS — M8949 Other hypertrophic osteoarthropathy, multiple sites: Secondary | ICD-10-CM

## 2019-10-29 DIAGNOSIS — M159 Polyosteoarthritis, unspecified: Secondary | ICD-10-CM

## 2019-10-29 NOTE — Progress Notes (Signed)
  [facility] Health Internal Medicine Residency Telephone Encounter Continuity Care Appointment  HPI:   This telephone encounter was created for [name] on 10/29/2019 for the following purpose/cc pain after fall. Baclofen makes her very sleepy at night.  Does help with the muscle spasms.  Voltaren on back and knee and this helps with her pain.  She has been doing this every morning and every night.  She has been taking Tylenol 3 as well.  She is not taking the advil unless she has a headache.    She is stressed because she is getting "old".  She has a friend at her current living center which is helpful to her.  However she hates her current living situation.  She notes that her friends doctors have been telling her that she is "dying" and now she feels like she will be left alone in her current situation.  She has not spoken with her daughter.  She has only been taking her wellbutrin 150mg  once a day.  She will increase to twice a day now.    We discussed possibly starting psychotherapy with our new therapist here in the clinic when she gets started.  Romie Minus will think about this option.       Past Medical History:  Past Medical History:  Diagnosis Date  . Anxiety   . Arthritis   . Depression   . GERD (gastroesophageal reflux disease)   . PONV (postoperative nausea and vomiting)       ROS:   Sadness, depressed, knee pain.    Assessment / Plan / Recommendations:   Please see A&P under problem oriented charting for assessment of the patient's acute and chronic medical conditions.   As always, pt is advised that if symptoms worsen or new symptoms arise, they should go to an urgent care facility or to to ER for further evaluation.   Consent and Medical Decision Making:    This is a telephone encounter between [name] and Gilles Chiquito on 10/29/2019 for follow up of knee pain. The visit was conducted with the patient located at home and Gilles Chiquito at [facility]. The  patient's identity was confirmed using their DOB and current address. The patient has consented to being evaluated through a telephone encounter and understands the associated risks (an examination cannot be done and the patient may need to come in for an appointment) / benefits (allows the patient to remain at home, decreasing exposure to coronavirus). I personally spent 10 minutes on medical discussion.       Follow up telehealth in 2-4 weeks.

## 2019-10-29 NOTE — Telephone Encounter (Signed)
Telehealth appt today.

## 2019-10-29 NOTE — Assessment & Plan Note (Signed)
We discussed her current dosing of medications.  She is only taking half the recommended dose for Wellbutrin and she will increase to 150mg  BID today.  We discussed therapy at length.  I do think this can be additive to her current medications.  She would prefer telehealth.  She is precontemplative.  We will discuss further at next visit.   Continue Wellbutrin and Lexapro.

## 2019-10-29 NOTE — Assessment & Plan Note (Signed)
Improving after fall.  Knee pain improved with voltaren gel.  She has cut back on ibuprofen.  Uses occasional baclofen and uses tylenol #3.   Overall improving.  Continue current therapy.

## 2019-10-29 NOTE — Assessment & Plan Note (Signed)
>>  ASSESSMENT AND PLAN FOR OSTEOARTHRITIS OF MULTIPLE JOINTS WRITTEN ON 10/29/2019 11:41 AM BY MULLEN, EMILY B, MD  Improving after fall.  Knee pain improved with voltaren gel.  She has cut back on ibuprofen.  Uses occasional baclofen and uses tylenol #3.   Overall improving.  Continue current therapy.

## 2019-10-31 ENCOUNTER — Encounter: Payer: Self-pay | Admitting: *Deleted

## 2019-10-31 NOTE — Progress Notes (Signed)

## 2019-11-06 NOTE — Progress Notes (Signed)
Things That May Be Affecting Your Health:  Alcohol  Hearing loss  Pain   X Depression  Home Safety  Sexual Health   Diabetes X Lack of physical activity  Stress   Difficulty with daily activities X Loneliness  Tiredness   Drug use  Medicines  Tobacco use   Falls  Motor Vehicle Safety  Weight   Food choices  Oral Health  Other    YOUR PERSONALIZED HEALTH PLAN : 1. Schedule your next subsequent Medicare Wellness visit in one year 2. Attend all of your regular appointments to address your medical issues 3. Complete the preventative screenings and services   Annual Wellness Visit   Medicare Covered Preventative Screenings and [facility] Men and Women Who How Often Need? Date of Last Service Action  Abdominal Aortic Aneurysm Adults with AAA risk factors Once     Alcohol Misuse and Counseling All Adults Screening once a year if no alcohol misuse. Counseling up to 4 face to face sessions.     Bone Density Measurement  Adults at risk for osteoporosis Once every 2 yrs Yes 2008 Due for repeat.  Was borderline in 2008  Lipid Panel Z13.6 All adults without CV disease Once every 5 yrs No    Colorectal Cancer   Stool sample or  Colonoscopy All adults 34 and older   Once every year  Every 10 years Yes 2008 Overdue for colonoscopy.  Please note patient has frequently declined screening exams.   Depression All Adults Once a year Yes Today Consider augmentation of therapy  Diabetes Screening Blood glucose, post glucose load, or GTT Z13.1  All adults at risk  Pre-diabetics  Once per year  Twice per year     Diabetes  Self-Management Training All adults Diabetics 10 hrs first year; 2 hours subsequent years. Requires Copay     Glaucoma  Diabetics  Family history of glaucoma  African Americans 42 yrs +  Hispanic Americans 44 yrs + Annually - requires coppay     Hepatitis C Z72.89 or F19.20  High Risk for HCV  Born between 1945 and 1965  Annually  Once No  2021   HIV Z11.4 All adults based on risk  Annually btw ages 43 & 64 regardless of risk  Annually > 65 yrs if at increased risk No 2008   Lung Cancer Screening Asymptomatic adults aged 38-77 with 48 pack yr history and current smoker OR quit within the last 15 yrs Annually Must have counseling and shared decision making documentation before first screen No    Medical Nutrition Therapy Adults with   Diabetes  Renal disease  Kidney transplant within past 3 yrs 3 hours first year; 2 hours subsequent years     Obesity and Counseling All adults Screening once a year Counseling if BMI 30 or higher  Today   Tobacco Use Counseling Adults who use tobacco  Up to 8 visits in one year     Vaccines Z23  Hepatitis B  Influenza   Pneumonia  Adults   Once  Once every flu season  Two different vaccines separated by one year Yes  Flu, pneumonia She received COVID - would get dates and update.   Next Annual Wellness Visit People with Medicare Every year  Today     [facility] Women Who How Often Need  Date of Last Service Action  Mammogram  Z12.31 Women over 82 One baseline ages 69-39. Annually ager 40 yrs+ Yes 2013 Of note,  she has regularly declined screening tests  Pap tests All women Annually if high risk. Every 2 yrs for normal risk women No 2018 Graduated  Screening for cervical cancer with   Pap (Z01.419 nl or Z01.411abnl) &  HPV Z11.51 Women aged 73 to 61 Once every 5 yrs     Screening pelvic and breast exams All women Annually if high risk. Every 2 yrs for normal risk women     Sexually Transmitted Diseases  Chlamydia  Gonorrhea  Syphilis All at risk adults Annually for non pregnant females at increased risk         [place] Men Who How Ofter Need  Date of Last Service Action  Prostate Cancer - DRE & PSA Men over 50 Annually.  DRE might require a copay.     Sexually Transmitted Diseases  Syphilis All at risk adults Annually for men at  increased risk

## 2019-11-07 ENCOUNTER — Telehealth: Payer: Self-pay | Admitting: Internal Medicine

## 2019-11-07 NOTE — Telephone Encounter (Signed)
Refill request- Pt's knee and back hurting from her fall x 1 month ago. Pt requesting a call back.   acetaminophen-codeine (TYLENOL #3) 300-30 MG tablet  [facility] [place], [place]

## 2019-11-07 NOTE — Telephone Encounter (Signed)
RTC, Patient states her knee and back is still hurting and the pain is worse.  Informed patient she will need to come in for evaluation.  Patient states she's not coming in because there are too many germs in the hospital.  Pt states she has a telephone appt in 2 weeks with Dr. Daryll Drown, no appt noted.  Patient insisting Dr. Daryll Drown told her she was going to call and check on her in 2 weeks.  Telehealth appt offered with resident, she is refusing to see anyone but PCP.  Requesting refill on pain med: Last sent 09/17/19 LOV-telehealth 10/29/19 SChaplin, RN,BSN

## 2019-11-10 ENCOUNTER — Other Ambulatory Visit: Payer: Self-pay | Admitting: Internal Medicine

## 2019-11-10 DIAGNOSIS — F330 Major depressive disorder, recurrent, mild: Secondary | ICD-10-CM

## 2019-11-10 MED ORDER — ACETAMINOPHEN-CODEINE #3 300-30 MG PO TABS: ORAL_TABLET | ORAL | 0 refills | Status: DC

## 2019-11-10 NOTE — Telephone Encounter (Signed)
Patient has been scheduled for telehealth visit this Wednesday 11/12/19 at 3 pm with Dr. Daryll Drown.

## 2019-11-10 NOTE — Telephone Encounter (Signed)
I can call her on Wednesday in the afternoon.  Can we put a telehealth on the calendar for me on Wed at 3pm?  Thank you!

## 2019-11-12 ENCOUNTER — Other Ambulatory Visit: Payer: Self-pay

## 2019-11-12 ENCOUNTER — Encounter: Payer: Medicare Other | Admitting: Internal Medicine

## 2019-11-21 ENCOUNTER — Other Ambulatory Visit: Payer: Self-pay

## 2019-11-21 ENCOUNTER — Ambulatory Visit (INDEPENDENT_AMBULATORY_CARE_PROVIDER_SITE_OTHER): Payer: Medicare Other | Admitting: Internal Medicine

## 2019-11-21 DIAGNOSIS — E669 Obesity, unspecified: Secondary | ICD-10-CM | POA: Diagnosis not present

## 2019-11-21 DIAGNOSIS — F331 Major depressive disorder, recurrent, moderate: Secondary | ICD-10-CM

## 2019-11-21 NOTE — Assessment & Plan Note (Signed)
She reports that this issue is better controlled and she is not as depressed as last time we spoke.  She does have a flat affect on the phone.  Reports "everything is the same."    Plan Continue lexapro Continue wellbutrin BID, encouraged her plan to help remember to take the wellbutrin.

## 2019-11-21 NOTE — Progress Notes (Signed)
  [facility] Health Internal Medicine Residency Telephone Encounter Continuity Care Appointment  HPI:   This telephone encounter was created for Ms. Karlita J Saini on 11/21/2019 for the following purpose/cc follow up of leg pain.  She feels that she is getting better.  She is using voltaren and this helps some.  She is taking advil and tylenol #3.  She notes that nothing is new.  She has a dog at home, who is laying on her right now.    She has tried to increase her wellbutrin to twice a day.  She is forgetting the evening dose, but she is trying a pill box to help with this.    We discussed getting more active to help with weight loss, including aquatherapy and silver sneakers.     Past Medical History:  Past Medical History:  Diagnosis Date  . Anxiety   . Arthritis   . Depression   . GERD (gastroesophageal reflux disease)   . PONV (postoperative nausea and vomiting)       ROS:   NO changes, no new symptoms.    Assessment / Plan / Recommendations:   Please see A&P under problem oriented charting for assessment of the patient's acute and chronic medical conditions.   As always, pt is advised that if symptoms worsen or new symptoms arise, they should go to an urgent care facility or to to ER for further evaluation.   Consent and Medical Decision Making:   This is a telephone encounter between [name] and Gilles Chiquito on 11/21/2019 for follow up of mood, pain. The visit was conducted with the patient located at home and Gilles Chiquito at [facility]. The patient's identity was confirmed using their DOB and current address. The patient has consented to being evaluated through a telephone encounter and understands the associated risks (an examination cannot be done and the patient may need to come in for an appointment) / benefits (allows the patient to remain at home, decreasing exposure to coronavirus). I personally spent 5 minutes on medical discussion.

## 2019-11-21 NOTE — Assessment & Plan Note (Signed)
We discussed ways to get more active and different exercises she can try.

## 2019-12-08 ENCOUNTER — Other Ambulatory Visit: Payer: Self-pay | Admitting: Internal Medicine

## 2019-12-08 DIAGNOSIS — F330 Major depressive disorder, recurrent, mild: Secondary | ICD-10-CM

## 2019-12-14 ENCOUNTER — Encounter: Payer: Self-pay | Admitting: Internal Medicine

## 2019-12-15 ENCOUNTER — Telehealth: Payer: Self-pay

## 2019-12-15 NOTE — Telephone Encounter (Signed)
[facility], pharmacist states pt picked up #120 on 10/19, according to directions that would have been a 60 day supply. Next refill will be done 12/13 and that is 3 days early.

## 2019-12-15 NOTE — Telephone Encounter (Signed)
ALPRAZolam Duanne Moron) 0.5 MG tablet, REFILL REQUEST @  [place], [place] Phone:  [phone]  Fax:  [phone]     Pt would like to speak with a nurse, please call pt back.

## 2019-12-15 NOTE — Telephone Encounter (Signed)
Called pt - stated she has been taking Xanax  "TAKE 1 TABLET BY MOUTH TWICE DAILY AS NEEDED AND 1 TO 2 AT NIGHT AS NEEDED FOR ANXIETY OR SLEEP"; so sometimes she takes 4 tabs a day which will be qty #120 which would be a 30 day. Last rx was "TAKE 1 TO 2 TABLETS BY MOUTH AT BEDTIME AS NEEDED FOR ANXIETY FOR SLEEP" on 11/16. She stated this time of year is very hard for her. Thanks

## 2019-12-16 ENCOUNTER — Other Ambulatory Visit: Payer: Self-pay | Admitting: Internal Medicine

## 2019-12-16 DIAGNOSIS — F330 Major depressive disorder, recurrent, mild: Secondary | ICD-10-CM

## 2019-12-16 NOTE — Telephone Encounter (Signed)
Xanax #120 sent 12/09/2019. Call placed to [facility] at [facility] States patient picked up a 60 day supply on 12/12/2019. Earliest refill date is 01/05/2020. They do have the Rx sent on 12/09/2019 on hold in her file. Hubbard Hartshorn, BSN, RN-BC

## 2019-12-17 NOTE — Telephone Encounter (Signed)
Thank you!  She will not be able to get another refill until due in 30 days.  She would need to come in for # pill refill, or have a telehealth visit this morning to have # increased for the holidays.

## 2019-12-17 NOTE — Telephone Encounter (Signed)
Agree with notes.  Will cancel refill.

## 2019-12-17 NOTE — Telephone Encounter (Signed)
Dr. Daryll Drown, will you be calling patient or do we need to schedule her with a Resident?

## 2019-12-18 NOTE — Telephone Encounter (Signed)
I would have her be scheduled with a resident if possible, or she can speak with me on Friday at my regularly scheduled clinic.  Thanks!

## 2019-12-19 ENCOUNTER — Telehealth: Payer: Self-pay | Admitting: Internal Medicine

## 2019-12-19 DIAGNOSIS — F330 Major depressive disorder, recurrent, mild: Secondary | ICD-10-CM

## 2019-12-19 MED ORDER — ALPRAZOLAM 0.5 MG PO TABS: ORAL_TABLET | ORAL | 0 refills | Status: DC

## 2019-12-19 NOTE — Telephone Encounter (Signed)
  [facility] Health Internal Medicine Residency Telephone Encounter Continuity Care Appointment  HPI:   This telephone encounter was created for [name] on 12/19/2019 for the following purpose/cc worsening anxiety.   Past Medical History:  Past Medical History:  Diagnosis Date  . Anxiety   . Arthritis   . Depression   . GERD (gastroesophageal reflux disease)   . PONV (postoperative nausea and vomiting)       ROS:      Assessment / Plan / Recommendations:   Please see A&P under problem oriented charting for assessment of the patient's acute and chronic medical conditions.   As always, pt is advised that if symptoms worsen or new symptoms arise, they should go to an urgent care facility or to to ER for further evaluation.   Consent and Medical Decision Making:   Patient discussed with Dr. Daryll Drown  This is a telephone encounter between [place] and [name] on 12/19/2019 for worsening anxiety. The visit was conducted with the patient located at home and Harvie Heck at [facility]. The patient's identity was confirmed using their DOB and current address. The patient has consented to being evaluated through a telephone encounter and understands the associated risks (an examination cannot be done and the patient may need to come in for an appointment) / benefits (allows the patient to remain at home, decreasing exposure to coronavirus). I personally spent 15 minutes on medical discussion.    Patient is calling regarding worsening anxiety and has ran out of her xanax. She notes that her dosing was recently changed and she did not realize this and was taking 1-2mg  daily prn for anxiety. She notes that she has had increasing anxiety during the holidays due to ongoing family problems and is requesting a prescription to bridge her until her next appointment. PDMP reviewed. Appears that she was previously receiving Alprazolam 0.5mg  120 tablets for 30 days and most recent  prescription was sent for 60 day supply. Discussed with patient's PCP and patient is appropriate to continue with 120 tablet for 30 days.  Rx sent to [facility] on [address].

## 2019-12-24 NOTE — Telephone Encounter (Signed)
Telehealth appointment has been scheduled for the patient for this Friday 12/26/2019 at 11:15 am.  Patient was notified via my chart.

## 2019-12-26 ENCOUNTER — Ambulatory Visit: Payer: Medicare Other | Admitting: Internal Medicine

## 2019-12-26 ENCOUNTER — Other Ambulatory Visit: Payer: Self-pay

## 2019-12-26 DIAGNOSIS — F330 Major depressive disorder, recurrent, mild: Secondary | ICD-10-CM

## 2019-12-26 NOTE — Progress Notes (Signed)
  [facility] Health Internal Medicine Residency Telephone Encounter Continuity Care Appointment  HPI:   This telephone encounter was created for [name] on 12/26/2019 for the following purpose/cc follow up of anxiety.  [name] now has her medication and is doing fine.  She notes that the pharmacy was difficult to work with.  No other concerns   Past Medical History:  Past Medical History:  Diagnosis Date  . Anxiety   . Arthritis   . Depression   . GERD (gastroesophageal reflux disease)   . PONV (postoperative nausea and vomiting)       ROS:   Anxiety better controlled.    Assessment / Plan / Recommendations:   Please see A&P under problem oriented charting for assessment of the patient's acute and chronic medical conditions.   As always, pt is advised that if symptoms worsen or new symptoms arise, they should go to an urgent care facility or to to ER for further evaluation.   Consent and Medical Decision Making:   This is a telephone encounter between [name] and [name] on 12/26/2019 for Follow up of anxiety. The visit was conducted with the patient located at home and [name] at [facility]. The patient's identity was confirmed using their DOB and current address. The patient has consented to being evaluated through a telephone encounter and understands the associated risks (an examination cannot be done and the patient may need to come in for an appointment) / benefits (allows the patient to remain at home, decreasing exposure to coronavirus). I personally spent 5 minutes on medical discussion.    Refill placed for her xanax.  No other issues today.

## 2019-12-27 ENCOUNTER — Encounter: Payer: Self-pay | Admitting: Internal Medicine

## 2019-12-31 ENCOUNTER — Other Ambulatory Visit: Payer: Self-pay

## 2019-12-31 NOTE — Telephone Encounter (Signed)
acetaminophen-codeine (TYLENOL #3) 300-30 MG tablet, REFILL REQUEST @  [place], [place] Phone:  [phone]  Fax:  [phone]

## 2020-01-02 MED ORDER — ACETAMINOPHEN-CODEINE #3 300-30 MG PO TABS
1.0000 | ORAL_TABLET | Freq: Four times a day (QID) | ORAL | 0 refills | Status: DC | PRN
Start: 2020-01-02 — End: 2020-02-25

## 2020-01-02 NOTE — Telephone Encounter (Signed)
Patient called back requesting refill on Tylenol #3. States she is taking 3 tabs daily in addition to advil and voltaren 2/2 right knee pain. Hubbard Hartshorn, BSN, RN-BC

## 2020-01-02 NOTE — Telephone Encounter (Signed)
Patient notified that Rx has been sent. She was very Patent attorney. Hubbard Hartshorn, BSN, RN-BC

## 2020-01-05 ENCOUNTER — Other Ambulatory Visit: Payer: Self-pay

## 2020-01-05 ENCOUNTER — Ambulatory Visit (INDEPENDENT_AMBULATORY_CARE_PROVIDER_SITE_OTHER): Payer: Medicare Other | Admitting: Internal Medicine

## 2020-01-05 ENCOUNTER — Telehealth: Payer: Self-pay | Admitting: *Deleted

## 2020-01-05 DIAGNOSIS — W06XXXA Fall from bed, initial encounter: Secondary | ICD-10-CM | POA: Diagnosis not present

## 2020-01-05 DIAGNOSIS — J069 Acute upper respiratory infection, unspecified: Secondary | ICD-10-CM | POA: Insufficient documentation

## 2020-01-05 DIAGNOSIS — J00 Acute nasopharyngitis [common cold]: Secondary | ICD-10-CM | POA: Diagnosis not present

## 2020-01-05 MED ORDER — FLUTICASONE PROPIONATE 50 MCG/ACT NA SUSP: 1.0000 | Freq: Every day | NASAL | 0 refills | Status: DC

## 2020-01-05 NOTE — Assessment & Plan Note (Signed)
She has been having congestion, post-nasal drip, and sore throat for the past five days. She started to have a cough two days ago with some clear green production. She denies fever, chills, nausea, diarrhea. The cough causes her rib pain to increase where she fell.. She denies shortness of breath, sinus pain, ear pain, or acute changes in hearing, or dysgeusia. No sick contacts. She had her two covid shots in May but has not yet had the booster. She has been taking mucinex, which has helped.  Symptoms consistent with viral upper respiratory infection but will obtain covid test.   - continue mucinex prn  - flonase qd for two weeks  - f/u if symptoms worsen or fail to improve  - given information for covid testing - she will [facility] and have the information sent here

## 2020-01-05 NOTE — Progress Notes (Signed)
   CC: congestion  This is a telephone encounter between [place] on 01/05/2020 for congestion and cold. The visit was conducted with the patient located at home and Marty Heck at [facility]. The patient's identity was confirmed using their DOB and current address. The patient has consented to being evaluated through a telephone encounter and understands the associated risks (an examination cannot be done and the patient may need to come in for an appointment) / benefits (allows the patient to remain at home, decreasing exposure to coronavirus). I personally spent 25 minutes on medical discussion.   HPI:  [name] is a 69 y.o. with PMH as below.   Please see A&P for assessment of the patient's acute and chronic medical conditions.   She fell out of her bed again and bit her tongue and her left side, she did not hit her head. She only had a small amount of bleeding form her tongue. She sleeps with a dog and she often moves toward the edge and has fallen out of bed due to this before. There is no overlying bruising. She is able to go about her normal activities and initially had no pain in her rib but now has increased pain when she coughs.    She has been having congestion, post-nasal drip, and sore throat for the past five days. She started to have a cough two days ago with some clear green production. She denies fever, chills, nausea, diarrhea. The cough causes her rib pain to increase where she fell.. She denies shortness of breath, sinus pain, ear pain, or acute changes in hearing, or dysgeusia. No sick contacts. She had her two covid shots in May but has not yet had the booster. She has been taking mucinex, which has helped.    Past Medical History:  Diagnosis Date  . Anxiety   . Arthritis   . Depression   . GERD (gastroesophageal reflux disease)   . PONV (postoperative nausea and vomiting)    Review of Systems:   ROS negative except as  noted in HPI.     Assessment & Plan:   See Encounters Tab for problem based charting.  Patient discussed with Dr. Dareen Piano

## 2020-01-05 NOTE — Telephone Encounter (Signed)
Pt calls and wants nurse to diagnose her on telephone that she doesn't have COVID and that she probably has a sinus infection, was informed nurse cannot do that, telehealth made for pt dr seawell per front office

## 2020-01-05 NOTE — Assessment & Plan Note (Signed)
She fell out of her bed again and bit her tongue and her left side, she did not hit her head. She only had a small amount of bleeding form her tongue. She sleeps with a dog and she often moves toward the edge and has fallen out of bed due to this before. There is no overlying bruising. She is able to go about her normal activities and initially had no pain in her rib but now has increased pain when she coughs.   - Discussed the possibility of broken rib. She does not want an x-ray as she cannot afford this. Discussed alternating advil, ice, heat and her voltaren gel. She also takes tylenol-codeine for knee pain. She will follow-up if she has increasing pain or shortness of breath.

## 2020-01-06 NOTE — Progress Notes (Signed)
Internal Medicine Clinic Attending  Case discussed with Dr. Seawell  At the time of the visit.  We reviewed the resident's history and exam and pertinent patient test results.  I agree with the assessment, diagnosis, and plan of care documented in the resident's note.  

## 2020-01-25 ENCOUNTER — Other Ambulatory Visit: Payer: Self-pay | Admitting: Internal Medicine

## 2020-01-25 DIAGNOSIS — F419 Anxiety disorder, unspecified: Secondary | ICD-10-CM

## 2020-01-25 DIAGNOSIS — I10 Essential (primary) hypertension: Secondary | ICD-10-CM

## 2020-01-25 DIAGNOSIS — F3341 Major depressive disorder, recurrent, in partial remission: Secondary | ICD-10-CM

## 2020-01-25 DIAGNOSIS — F330 Major depressive disorder, recurrent, mild: Secondary | ICD-10-CM

## 2020-01-25 DIAGNOSIS — K219 Gastro-esophageal reflux disease without esophagitis: Secondary | ICD-10-CM

## 2020-02-03 ENCOUNTER — Encounter: Payer: Self-pay | Admitting: Internal Medicine

## 2020-02-03 NOTE — Telephone Encounter (Signed)
Patient is scheduled for telehealth on 03/05/2020.  Would you like to overbook her one of your clinics later on this month?

## 2020-02-23 ENCOUNTER — Other Ambulatory Visit: Payer: Self-pay | Admitting: Internal Medicine

## 2020-02-24 NOTE — Telephone Encounter (Signed)
Last office visit: 01/05/2020 (telehealth) Last UDS: CMA unable to locate recent result Last Written Date: 01/02/20 #70 Next appt: 03/05/20

## 2020-02-25 ENCOUNTER — Encounter: Payer: Self-pay | Admitting: Internal Medicine

## 2020-03-05 ENCOUNTER — Other Ambulatory Visit: Payer: Self-pay

## 2020-03-05 ENCOUNTER — Ambulatory Visit (INDEPENDENT_AMBULATORY_CARE_PROVIDER_SITE_OTHER): Payer: Medicare Other | Admitting: Internal Medicine

## 2020-03-05 ENCOUNTER — Encounter: Payer: Self-pay | Admitting: Internal Medicine

## 2020-03-05 DIAGNOSIS — M8949 Other hypertrophic osteoarthropathy, multiple sites: Secondary | ICD-10-CM

## 2020-03-05 DIAGNOSIS — E7800 Pure hypercholesterolemia, unspecified: Secondary | ICD-10-CM

## 2020-03-05 DIAGNOSIS — F331 Major depressive disorder, recurrent, moderate: Secondary | ICD-10-CM | POA: Diagnosis not present

## 2020-03-05 DIAGNOSIS — L720 Epidermal cyst: Secondary | ICD-10-CM

## 2020-03-05 DIAGNOSIS — E559 Vitamin D deficiency, unspecified: Secondary | ICD-10-CM

## 2020-03-05 DIAGNOSIS — M159 Polyosteoarthritis, unspecified: Secondary | ICD-10-CM

## 2020-03-05 NOTE — Progress Notes (Signed)
  [facility] Internal Medicine Residency Telephone Encounter Continuity Care Appointment  HPI:   This telephone encounter was created for [name] on 03/05/2020 for the following purpose/cc follow up for chronic pain, anxiety and HTN.  Reports that her pain is worse when it rains.  She is using voltaren for her back which helps.  She is still taking her tylenol 3 without issue.   She has a nodule on her earlobe which she would like taken care of.  She is interested in seeing the dermatologist.   Feels really dizzy and nauseated.  No sore throat.  She is worried she may be catching COVID.  She had an active vomiting spell on the phone, she declined in person visit and evaluation by EMS at this time.  I asked her to call the clinic this afternoon if she is not feeling any better.   UPDATE: She felt better in the afternoon after taking a nap and reported feeling back to herself.    Past Medical History:  Past Medical History:  Diagnosis Date  . Anxiety   . Arthritis   . Depression   . GERD (gastroesophageal reflux disease)   . PONV (postoperative nausea and vomiting)       ROS:   Nausea, vomiting, dizziness.  Chronic pain.  Anxiety is improved.    Assessment / Plan / Recommendations:   Please see A&P under problem oriented charting for assessment of the patient's acute and chronic medical conditions.   As always, pt is advised that if symptoms worsen or new symptoms arise, they should go to an urgent care facility or to to ER for further evaluation.   Consent and Medical Decision Making:   This is a telephone encounter between [name] and Gilles Chiquito on 03/05/2020 for follow up of medications. The visit was conducted with the patient located at home and Gilles Chiquito at [facility]. The patient's identity was confirmed using their DOB and current address. The patient has consented to being evaluated through a telephone encounter and understands the associated  risks (an examination cannot be done and the patient may need to come in for an appointment) / benefits (allows the patient to remain at home, decreasing exposure to coronavirus). I personally spent 10 minutes on medical discussion.     Return to be seen in person in 3 months, sooner if not improving by this afternoon.

## 2020-03-08 NOTE — Assessment & Plan Note (Signed)
She would benefit from rechecking of this at next visit to see if still deficient.

## 2020-03-08 NOTE — Assessment & Plan Note (Signed)
>>  ASSESSMENT AND PLAN FOR OSTEOARTHRITIS OF MULTIPLE JOINTS WRITTEN ON 03/08/2020  2:07 PM BY MULLEN, EMILY B, MD  Pain is relatively well controlled on current regimen.  She fills her Tylenol # 3 appropriately and has good refill history.  She also uses voltaren gel.    Plan Continue current pain management Update Pain management contract at next visit.

## 2020-03-08 NOTE — Assessment & Plan Note (Signed)
Last LDL was 113 in 2018.  Will check a new panel at next visit and calculate a risk score.

## 2020-03-08 NOTE — Assessment & Plan Note (Signed)
Pain is relatively well controlled on current regimen.  She fills her Tylenol # 3 appropriately and has good refill history.  She also uses voltaren gel.    Plan Continue current pain management Update Pain management contract at next visit.

## 2020-03-08 NOTE — Assessment & Plan Note (Signed)
She is currently taking bupropion and lexapro.  She continues to have depressed mood and symptoms.  She would benefit from counseling.  Now that we have our new counselor, Dr. Theodis Shove, I will suggest this to her.   Plan Continue current therapy Counseling referral at next visit.

## 2020-03-10 ENCOUNTER — Encounter: Payer: Self-pay | Admitting: Internal Medicine

## 2020-03-10 DIAGNOSIS — F330 Major depressive disorder, recurrent, mild: Secondary | ICD-10-CM

## 2020-03-11 MED ORDER — ALPRAZOLAM 0.5 MG PO TABS: ORAL_TABLET | ORAL | 0 refills | Status: DC

## 2020-03-23 ENCOUNTER — Other Ambulatory Visit: Payer: Self-pay | Admitting: Internal Medicine

## 2020-03-23 DIAGNOSIS — J00 Acute nasopharyngitis [common cold]: Secondary | ICD-10-CM

## 2020-03-25 MED ORDER — FLUTICASONE PROPIONATE 50 MCG/ACT NA SUSP: 1.0000 | Freq: Every day | NASAL | 0 refills | Status: DC

## 2020-04-06 ENCOUNTER — Other Ambulatory Visit: Payer: Self-pay | Admitting: Internal Medicine

## 2020-04-13 ENCOUNTER — Other Ambulatory Visit: Payer: Self-pay | Admitting: Internal Medicine

## 2020-04-13 MED ORDER — ACETAMINOPHEN-CODEINE #3 300-30 MG PO TABS
1.0000 | ORAL_TABLET | Freq: Four times a day (QID) | ORAL | 0 refills | Status: DC | PRN
Start: 2020-04-13 — End: 2020-05-19

## 2020-04-21 ENCOUNTER — Other Ambulatory Visit: Payer: Self-pay | Admitting: Internal Medicine

## 2020-04-21 DIAGNOSIS — F330 Major depressive disorder, recurrent, mild: Secondary | ICD-10-CM

## 2020-04-24 ENCOUNTER — Encounter: Payer: Self-pay | Admitting: Internal Medicine

## 2020-04-26 NOTE — Telephone Encounter (Signed)
Call placed to April at [facility]. States they received the Rx for #120 with 2 refills on 04/22/2020. States patient p/u a 60 day supply on 03/11/2020 so earliest fill date is 05/07/2020.

## 2020-04-26 NOTE — Telephone Encounter (Signed)
-----   Message from Sid Falcon, MD sent at 04/26/2020  3:51 PM EDT ----- Regarding: Rx for Alprazolam Hello team - -  Can one of you contact Ms. Lodwick's pharmacy and see if they received her alprazolam prescription?   Thank you!  Gilles Chiquito, MD

## 2020-05-03 ENCOUNTER — Encounter: Payer: Self-pay | Admitting: Internal Medicine

## 2020-05-05 ENCOUNTER — Telehealth: Payer: Self-pay

## 2020-05-05 NOTE — Telephone Encounter (Signed)
Please have the patient make an appointment to be seen in [facility]

## 2020-05-05 NOTE — Telephone Encounter (Signed)
Pt is requesting a call back in regards to a lump on her ear ( left )  She is wanting to know if she needs an appt for a referral

## 2020-05-06 ENCOUNTER — Encounter: Payer: Self-pay | Admitting: Internal Medicine

## 2020-05-06 DIAGNOSIS — L720 Epidermal cyst: Secondary | ICD-10-CM

## 2020-05-06 DIAGNOSIS — F330 Major depressive disorder, recurrent, mild: Secondary | ICD-10-CM

## 2020-05-06 NOTE — Telephone Encounter (Signed)
Called pt to inform she needs to schedule an appt - stated she will call back end of May, as suggested by front office, to schedule an appt with Dr Daryll Drown who she prefers to see.

## 2020-05-10 ENCOUNTER — Other Ambulatory Visit: Payer: Self-pay | Admitting: Internal Medicine

## 2020-05-10 DIAGNOSIS — F330 Major depressive disorder, recurrent, mild: Secondary | ICD-10-CM

## 2020-05-10 MED ORDER — ALPRAZOLAM 0.5 MG PO TABS: ORAL_TABLET | ORAL | 2 refills | Status: DC

## 2020-05-11 ENCOUNTER — Other Ambulatory Visit: Payer: Self-pay | Admitting: Internal Medicine

## 2020-05-11 DIAGNOSIS — J00 Acute nasopharyngitis [common cold]: Secondary | ICD-10-CM

## 2020-05-13 MED ORDER — FLUTICASONE PROPIONATE 50 MCG/ACT NA SUSP: 1.0000 | Freq: Every day | NASAL | 0 refills | Status: DC

## 2020-05-17 ENCOUNTER — Encounter: Payer: Self-pay | Admitting: Internal Medicine

## 2020-05-19 ENCOUNTER — Other Ambulatory Visit: Payer: Self-pay | Admitting: Internal Medicine

## 2020-05-19 MED ORDER — ACETAMINOPHEN-CODEINE #3 300-30 MG PO TABS: 1.0000 | ORAL_TABLET | Freq: Four times a day (QID) | ORAL | 0 refills | Status: DC | PRN

## 2020-05-19 MED ORDER — DICLOFENAC SODIUM 1 % EX GEL: 4.0000 g | Freq: Four times a day (QID) | CUTANEOUS | 1 refills | Status: DC

## 2020-05-27 ENCOUNTER — Encounter: Payer: Self-pay | Admitting: Internal Medicine

## 2020-05-27 ENCOUNTER — Other Ambulatory Visit: Payer: Self-pay

## 2020-05-27 ENCOUNTER — Ambulatory Visit (INDEPENDENT_AMBULATORY_CARE_PROVIDER_SITE_OTHER): Payer: Medicare Other | Admitting: Internal Medicine

## 2020-05-27 DIAGNOSIS — Z0000 Encounter for general adult medical examination without abnormal findings: Secondary | ICD-10-CM | POA: Diagnosis not present

## 2020-05-27 NOTE — Progress Notes (Signed)
I discussed the AWV findings with the RN who conducted the visit. I was present in the office suite and immediately available to provide assistance and direction throughout the time the service was provided.   

## 2020-05-27 NOTE — Patient Instructions (Addendum)
All Notes   Progress Notes by Sid Falcon, MD at 10/31/2019 3:42 PM  Author: Sid Falcon, MD Author Type: Physician Filed: 11/06/2019 9:46 AM  Note Status: Signed Cosign: Cosign Not Required Encounter Date: 10/31/2019  Editor: Sid Falcon, MD (Physician)             Things That May Be Affecting Your Health:  Alcohol  Hearing loss  Pain   X Depression  Home Safety  Sexual Health   Diabetes X Lack of physical activity  Stress   Difficulty with daily activities X Loneliness  Tiredness   Drug use  Medicines  Tobacco use   Falls  Motor Vehicle Safety  Weight   Food choices  Oral Health  Other    YOUR PERSONALIZED HEALTH PLAN : 1. Schedule your next subsequent Medicare Wellness visit in one year 2. Attend all of your regular appointments to address your medical issues 3. Complete the preventative screenings and services 4.  Please reconsider a referral to Dr. Theodis Shove, [place] 5.  Please begin seated and standing exercises with exercise band to increase strength and balance 2-3 times/week for 5-10 minutes.   Annual Wellness Visit                       Medicare Covered Preventative Screenings and Services  Services & Screenings Men and Women Who How Often Need? Date of Last Service Action  Abdominal Aortic Aneurysm Adults with AAA risk factors Once     Alcohol Misuse and Counseling All Adults Screening once a year if no alcohol misuse. Counseling up to 4 face to face sessions.     Bone Density Measurement  Adults at risk for osteoporosis Once every 2 yrs Yes 2008 Due for repeat.  Was borderline in 2008  Lipid Panel Z13.6 All adults without CV disease Once every 5 yrs No    Colorectal Cancer   Stool sample or  Colonoscopy All adults 44 and older   Once every year  Every 10 years Yes 2008 Overdue for colonoscopy.  Please note patient has frequently declined screening exams.   Depression All Adults Once a year Yes Today Consider augmentation  of therapy  Diabetes Screening Blood glucose, post glucose load, or GTT Z13.1  All adults at risk  Pre-diabetics  Once per year  Twice per year     Diabetes  Self-Management Training All adults Diabetics 10 hrs first year; 2 hours subsequent years. Requires Copay     Glaucoma  Diabetics  Family history of glaucoma  African Americans 66 yrs +  Hispanic Americans 61 yrs + Annually - requires coppay     Hepatitis C Z72.89 or F19.20  High Risk for HCV  Born between 1945 and 1965  Annually  Once No 2021   HIV Z11.4 All adults based on risk  Annually btw ages 59 & 25 regardless of risk  Annually > 65 yrs if at increased risk No 2008   Lung Cancer Screening Asymptomatic adults aged 66-77 with 30 pack yr history and current smoker OR quit within the last 15 yrs Annually Must have counseling and shared decision making documentation before first screen No    Medical Nutrition Therapy Adults with   Diabetes  Renal disease  Kidney transplant within past 3 yrs 3 hours first year; 2 hours subsequent years     Obesity and Counseling All adults Screening once a year Counseling if BMI 30 or higher  Today   Tobacco  Use Counseling Adults who use tobacco  Up to 8 visits in one year     Vaccines Z23  Hepatitis B  Influenza   Pneumonia  Adults   Once  Once every flu season  Two different vaccines separated by one year Yes  Flu, pneumonia She received COVID - would get dates and update.   Next Annual Wellness Visit People with Medicare Every year  Today     [place] Women Who How Often Need  Date of Last Service Action  Mammogram  Z12.31 Women over 27 One baseline ages 84-39. Annually ager 83 yrs+ Yes 2013 Of note, she has regularly declined screening tests  Pap tests All women Annually if high risk. Every 2 yrs for normal risk women No 2018 Graduated  Screening for cervical cancer with   Pap (Z01.419 nl or Z01.411abnl) &  HPV  Z11.51 Women aged 15 to 66 Once every 5 yrs     Screening pelvic and breast exams All women Annually if high risk. Every 2 yrs for normal risk women     Sexually Transmitted Diseases  Chlamydia  Gonorrhea  Syphilis All at risk adults Annually for non pregnant females at increased risk                Health Maintenance, Female Adopting a healthy lifestyle and getting preventive care are important in promoting health and wellness. Ask your health care provider about:  The right schedule for you to have regular tests and exams.  Things you can do on your own to prevent diseases and keep yourself healthy. What should I know about diet, weight, and exercise? Eat a healthy diet  Eat a diet that includes plenty of vegetables, fruits, low-fat dairy products, and lean protein.  Do not eat a lot of foods that are high in solid fats, added sugars, or sodium.   Maintain a healthy weight Body mass index (BMI) is used to identify weight problems. It estimates body fat based on height and weight. Your health care provider can help determine your BMI and help you achieve or maintain a healthy weight. Get regular exercise Get regular exercise. This is one of the most important things you can do for your health. Most adults should:  Exercise for at least 150 minutes each week. The exercise should increase your heart rate and make you sweat (moderate-intensity exercise).  Do strengthening exercises at least twice a week. This is in addition to the moderate-intensity exercise.  Spend less time sitting. Even light physical activity can be beneficial. Watch cholesterol and blood lipids Have your blood tested for lipids and cholesterol at 72 years of age, then have this test every 5 years. Have your cholesterol levels checked more often if:  Your lipid or cholesterol levels are high.  You are older than 72 years of age.  You are at high risk for heart disease. What should I  know about cancer screening? Depending on your health history and family history, you may need to have cancer screening at various ages. This may include screening for:  Breast cancer.  Cervical cancer.  Colorectal cancer.  Skin cancer.  Lung cancer. What should I know about heart disease, diabetes, and high blood pressure? Blood pressure and heart disease  High blood pressure causes heart disease and increases the risk of stroke. This is more likely to develop in people who have high blood pressure readings, are of African descent, or are overweight.  Have your blood pressure checked: ?  Every 3-5 years if you are 34-32 years of age. ? Every year if you are 11 years old or older. Diabetes Have regular diabetes screenings. This checks your fasting blood sugar level. Have the screening done:  Once every three years after age 54 if you are at a normal weight and have a low risk for diabetes.  More often and at a younger age if you are overweight or have a high risk for diabetes. What should I know about preventing infection? Hepatitis B If you have a higher risk for hepatitis B, you should be screened for this virus. Talk with your health care provider to find out if you are at risk for hepatitis B infection. Hepatitis C Testing is recommended for:  Everyone born from [birth year] through 1965.  Anyone with known risk factors for hepatitis C. Sexually transmitted infections (STIs)  Get screened for STIs, including gonorrhea and chlamydia, if: ? You are sexually active and are younger than 72 years of age. ? You are older than 72 years of age and your health care provider tells you that you are at risk for this type of infection. ? Your sexual activity has changed since you were last screened, and you are at increased risk for chlamydia or gonorrhea. Ask your health care provider if you are at risk.  Ask your health care provider about whether you are at high risk for HIV. Your health  care provider may recommend a prescription medicine to help prevent HIV infection. If you choose to take medicine to prevent HIV, you should first get tested for HIV. You should then be tested every 3 months for as long as you are taking the medicine. Pregnancy  If you are about to stop having your period (premenopausal) and you may become pregnant, seek counseling before you get pregnant.  Take 400 to 800 micrograms (mcg) of folic acid every day if you become pregnant.  Ask for birth control (contraception) if you want to prevent pregnancy. Osteoporosis and menopause Osteoporosis is a disease in which the bones lose minerals and strength with aging. This can result in bone fractures. If you are 29 years old or older, or if you are at risk for osteoporosis and fractures, ask your health care provider if you should:  Be screened for bone loss.  Take a calcium or vitamin D supplement to lower your risk of fractures.  Be given hormone replacement therapy (HRT) to treat symptoms of menopause. Follow these instructions at home: Lifestyle  Do not use any products that contain nicotine or tobacco, such as cigarettes, e-cigarettes, and chewing tobacco. If you need help quitting, ask your health care provider.  Do not use street drugs.  Do not share needles.  Ask your health care provider for help if you need support or information about quitting drugs. Alcohol use  Do not drink alcohol if: ? Your health care provider tells you not to drink. ? You are pregnant, may be pregnant, or are planning to become pregnant.  If you drink alcohol: ? Limit how much you use to 0-1 drink a day. ? Limit intake if you are breastfeeding.  Be aware of how much alcohol is in your drink. In the [place] one drink equals one 12 oz bottle of beer (355 mL), one 5 oz glass of wine (148 mL), or one 1 oz glass of hard liquor (44 mL). General instructions  Schedule regular health, dental, and eye exams.  Stay  current with your vaccines.  Tell  your health care provider if: ? You often feel depressed. ? You have ever been abused or do not feel safe at home. Summary  Adopting a healthy lifestyle and getting preventive care are important in promoting health and wellness.  Follow your health care provider's instructions about healthy diet, exercising, and getting tested or screened for diseases.  Follow your health care provider's instructions on monitoring your cholesterol and blood pressure. This information is not intended to replace advice given to you by your health care provider. Make sure you discuss any questions you have with your health care provider. Document Revised: 01/02/2018 Document Reviewed: 01/02/2018 Elsevier Patient Education  2021 [employer].

## 2020-05-27 NOTE — Progress Notes (Signed)
This AWV is being conducted by [place] only. The patient was located at home and I was located in [facility]. The patient's identity was confirmed using their DOB and current address. The patient or his/her legal guardian has consented to being evaluated through a telephone encounter and understands the associated risks (an examination cannot be done and the patient may need to come in for an appointment) / benefits (allows the patient to remain at home, decreasing exposure to coronavirus). I personally spent 50 minutes conducting the AWV.  Subjective:   [name] is a 70 y.o. female who presents for a [employer] Visit.  The following items have been reviewed and updated today in the appropriate area in the EMR.   Health Risk Assessment  Height, weight, BMI, and BP Visual acuity if needed Depression screen Fall risk / safety level Advance directive discussion Medical and family history were reviewed and updated Updating list of other providers & suppliers Medication reconciliation, including over the counter medicines Cognitive screen Written screening schedule Risk Factor list Personalized health advice, risky behaviors, and treatment advice  Social History   Social History Narrative   Current Social History 05/27/2020        Patient lives alone in an apartment which is 1 story. There are not steps up to the entrance.       Patient's method of transportation is personal car.      The highest level of education was some college.      The patient currently retired.      Identified important Relationships are "My dog"       Pets : one dog       Interests / Fun: "being with my dog, I don't like people"       Current Stressors: poor family relationships       Religious / Personal Beliefs: "I believe in God"            Objective:    Vitals: There were no vitals taken for this visit. Vitals are unable to obtained due to [id number] public  health emergency  Activities of Daily Living In your present state of health, do you have any difficulty performing the following activities: 05/27/2020 01/05/2020  Hearing? N N  Comment - -  Vision? N N  Comment - -  Difficulty concentrating or making decisions? N Y  Walking or climbing stairs? Y Y  Comment knee and back pain -  Dressing or bathing? N N  Doing errands, shopping? N Y  Some recent data might be hidden    Goals Goals    . Prevent Falls       Fall Risk Fall Risk  05/27/2020 03/05/2020 09/05/2019 04/18/2019 02/08/2018  Falls in the past year? 1 0 1 1 0  Comment - - ONLY HAD FALL FROM ROLLING OUT OF BED - -  Number falls in past yr: 1 - 1 1 -  Injury with Fall? 1 - 1 1 -  Comment - - FACE - -  Risk Factor Category  - - - - -  Risk for fall due to : History of fall(s) No Fall Risks - Impaired balance/gait -  Risk for fall due to: Comment - - - - -  Follow up Falls evaluation completed - - Falls prevention discussed -    Depression Screen PHQ 2/9 Scores 05/27/2020 03/05/2020 01/05/2020 09/05/2019  PHQ - 2 Score 3 5 5 6   PHQ- 9 Score 18 16  60 13  Exception Documentation - - - -     Cognitive Testing Six-Item Cognitive Screener   "I would like to ask you some questions that ask you to use your memory. I am going to name three objects. Please wait until I say all three words, then repeat them. Remember what they are  because I am going to ask you to name them again in a few minutes. Please repeat these words for me: APPLE--TABLE--PENNY." (Interviewer may repeat names 3 times if necessary but repetition not scored.)  Did patient correctly repeat all three words? Yes - may proceed with screen  What year is this? Correct What month is this? Correct What day of the week is this? Correct  What were the three objects I asked you to remember? . Apple Correct . Table Correct . Penny Correct  Score one point for each incorrect answer.  A score of 2 or more points warrants  additional investigation.  Patient's score 0    OUD Screen = 1    Assessment and Plan:     The patient declined her mammogram, dexa scan, colonoscopy, and all vaccines (all encouraged by RN).  The patient scored 18 on the PHQ9 (past scores range from 13-19).  Pt is currently on antidepressant treatment and RN encouraged referral to [facility], patient refused.  The patient states she does not get any exercise, RN encouraged patient to begin seated and standing exercises with exercise band to increase strength and balance.  Patient states she will think about it, not willing to make this a goal in her care.  CDC Handout on Fall Prevention and Handout on Home Exercise Program, Access codes [id number] and [id number] given/mailed to patient with exercise band.   During the course of the visit the patient was educated and counseled about appropriate screening and preventive services as documented in the assessment and plan.  The printed AVS was given to the patient and included an updated screening schedule, a list of risk factors, and personalized health advice.        Higinio Roger, RN  05/27/2020

## 2020-05-28 NOTE — Progress Notes (Signed)
Internal Medicine Clinic Attending  Case discussed with Dr. Krienke at the time of the visit.  We reviewed the AWV findings.  I agree with the assessment, diagnosis, and plan of care documented in the AWV note.       

## 2020-05-31 ENCOUNTER — Other Ambulatory Visit: Payer: Self-pay | Admitting: Internal Medicine

## 2020-05-31 DIAGNOSIS — F330 Major depressive disorder, recurrent, mild: Secondary | ICD-10-CM

## 2020-05-31 NOTE — Progress Notes (Signed)
Patient requesting assistance with housing (section 8) and I have suggested referral to our counselor.  Both referrals placed.   Gilles Chiquito, MD

## 2020-06-02 ENCOUNTER — Telehealth: Payer: Self-pay | Admitting: *Deleted

## 2020-06-02 NOTE — Chronic Care Management (AMB) (Signed)
  Care Management   Note  06/02/2020 Name: [name] MRN: [id number] DOB: 1950/07/20  [name] is a 70 y.o. year old female who is a primary care patient of Sid Falcon, MD. I reached out to [name] by phone today in response to a referral sent by [name]'s PCP, Sid Falcon, MD.  [name] was given information about care management services today including:  1. Care management services include personalized support from designated clinical staff supervised by her physician, including individualized plan of care and coordination with other care providers 2. 24/7 contact phone numbers for assistance for urgent and routine care needs. 3. The patient may stop care management services at any time by phone call to the office staff.  Patient agreed to services and verbal consent obtained.   Follow up plan: Telephone appointment with care management team member scheduled for:  BSW 06/15/2020 [facility] 07/13/2020  [place] [place], [place], [place] [id number] Direct Dial: [phone]

## 2020-06-04 ENCOUNTER — Encounter: Payer: Self-pay | Admitting: Internal Medicine

## 2020-06-14 NOTE — Addendum Note (Signed)
Addended by: Gilles Chiquito B on: 06/14/2020 02:38 PM   Modules accepted: Orders

## 2020-06-15 ENCOUNTER — Telehealth: Payer: Self-pay | Admitting: Licensed Clinical Social Worker

## 2020-06-15 ENCOUNTER — Other Ambulatory Visit: Payer: Self-pay

## 2020-06-15 ENCOUNTER — Ambulatory Visit: Payer: Medicare Other | Admitting: Licensed Clinical Social Worker

## 2020-06-15 NOTE — Patient Instructions (Signed)
Visit Information  Instructions: patient will work with SW to address concerns related to housing  Patient was given the following information about care management and care coordination services today, agreed to services, and gave verbal consent: 1.care management/care coordination services include personalized support from designated clinical staff supervised by their physician, including individualized plan of care and coordination with other care providers 2. 24/7 contact phone numbers for assistance for urgent and routine care needs. 3. The patient may stop care management/care coordination services at any time by phone call to the office staff.  Patient verbalizes understanding of instructions provided today and agrees to view in [place].   SW will follow up in 30 days  Milus Height, [place]  Social Worker [facility] Care Management  [phone]

## 2020-06-15 NOTE — Chronic Care Management (AMB) (Signed)
  Care Management   Social Work Visit Note  06/15/2020 Name: [name] MRN: [id number] DOB: [birth year]  [name] is a 70 y.o. year old female who sees Sid Falcon, MD for primary care. The care management team was consulted for assistance with care management and care coordination needs related to [facility] Resources    Patient was given the following information about care management and care coordination services today, agreed to services, and gave verbal consent: 1.care management/care coordination services include personalized support from designated clinical staff supervised by their physician, including individualized plan of care and coordination with other care providers 2. 24/7 contact phone numbers for assistance for urgent and routine care needs. 3. The patient may stop care management/care coordination services at any time by phone call to the office staff.  Engaged with patient by telephone for initial visit in response to provider referral for social work chronic care management and care coordination services.  Assessment: Review of patient history, allergies, and health status during evaluation of patient need for care management/care coordination services.    Interventions:  . Patient interviewed and appropriate assessments performed . Collaborated with clinical team regarding patient needs  . Provided patient with information about  Patient declined resources for food. Patient stated she does not want to beg for food. Patient stated she is eligible for [employer] benefits, but the amount received is so low she rather not complete another application. Patient stated she receives Social Security on the 3rd of each month. Patient stated she is not in need of housing. Patient stated she is approved for section 8, but is currently on waitlist. Patient stated she is communicating with Ms. Marolyn Hammock and Ms. Dalbert Batman [phone]) with housing authority. Patient stated she  is not at risk of losing her home.   SDOH (Social Determinants of Health) assessments performed: Yes     Plan:  . patient will work with BSW to address needs related to Housing barriers . Social Worker will follow up with patient in 30 days. Marland Kitchen   SIGNATURE

## 2020-06-16 NOTE — Telephone Encounter (Signed)
Initial call to patient was unsuccessful. Patient returned SW call immediately.

## 2020-06-19 ENCOUNTER — Other Ambulatory Visit: Payer: Self-pay | Admitting: Internal Medicine

## 2020-06-22 ENCOUNTER — Encounter: Payer: Self-pay | Admitting: Internal Medicine

## 2020-06-23 MED ORDER — ACETAMINOPHEN-CODEINE #3 300-30 MG PO TABS: 1.0000 | ORAL_TABLET | Freq: Four times a day (QID) | ORAL | 0 refills | Status: DC | PRN

## 2020-06-30 ENCOUNTER — Other Ambulatory Visit: Payer: Self-pay

## 2020-06-30 ENCOUNTER — Ambulatory Visit: Payer: Medicare Other | Admitting: Behavioral Health

## 2020-06-30 DIAGNOSIS — F419 Anxiety disorder, unspecified: Secondary | ICD-10-CM

## 2020-06-30 DIAGNOSIS — F331 Major depressive disorder, recurrent, moderate: Secondary | ICD-10-CM

## 2020-06-30 NOTE — BH Specialist Note (Signed)
Integrated Behavioral Health via Telemedicine Visit  06/30/2020 [name] [id number]  Number of Integrated Behavioral Health visits: 1/6 Session Start time: 2:00pm  Session End time: 2:30pm Total time: 30  Referring Provider: Dr. Gilles Chiquito, MD Patient/Family location: Pt @ home in private [facility] Provider location: [facility] Office All persons participating in visit: Pt & Clinician  Types of Service: Individual psychotherapy  I connected with [name] and/or [name]'s self via  Telephone or Geologist, engineering  (Video is Caregility application) and verified that I am speaking with the correct person using two identifiers. Discussed confidentiality: Yes   I discussed the limitations of telemedicine and the availability of in person appointments.  Discussed there is a possibility of technology failure and discussed alternative modes of communication if that failure occurs.  I discussed that engaging in this telemedicine visit, they consent to the provision of behavioral healthcare and the services will be billed under their insurance.  Patient and/or legal guardian expressed understanding and consented to Telemedicine visit: Yes   Presenting Concerns: Patient and/or family reports the following symptoms/concerns: elevated anxiety Duration of problem: years; Severity of problem: moderate  Patient and/or Family's Strengths/Protective Factors: Social and Emotional competence, Concrete supports in place (healthy food, safe environments, etc.) and Physical Health (exercise, healthy diet, medication compliance, etc.)  Goals Addressed: Patient will: 1.  Reduce symptoms of: anxiety and depression  2.  Increase knowledge and/or ability of: coping skills, healthy habits and self-management skills  3.  Demonstrate ability to: Increase healthy adjustment to current life circumstances and Increase motivation to adhere to plan of care  Progress  towards Goals: Estb'd today: Pt agreed to therapy sessions into the future  Interventions: Interventions utilized:  Behavioral Activation and Supportive Counseling Standardized Assessments completed: screeners prn  Patient and/or Family Response: Pt receptive to call today & presents in anxious mood. Pt requests future appt to work on anx/dep  Assessment: Patient currently experiencing elevated anx/dep due to health status changes & managing her pain & migraines.   Patient may benefit from cont'd sessions to manage anxiety.  Plan: 1. Follow up with behavioral health clinician on : 2-3 wks on telehealth for 30 min ck-in 2. Behavioral recommendations: cont healthy eating goal as a Vegan & management of anx/dep Sx with medication regime. 3. Referral(s): [name] (In Clinic)  I discussed the assessment and treatment plan with the patient and/or parent/guardian. They were provided an opportunity to ask questions and all were answered. They agreed with the plan and demonstrated an understanding of the instructions.   They were advised to call back or seek an in-person evaluation if the symptoms worsen or if the condition fails to improve as anticipated.  Megan Hutching, LMFT

## 2020-07-13 ENCOUNTER — Telehealth: Payer: Self-pay

## 2020-07-13 ENCOUNTER — Telehealth: Payer: Medicare Other

## 2020-07-13 ENCOUNTER — Ambulatory Visit: Payer: Medicare Other | Admitting: Behavioral Health

## 2020-07-13 DIAGNOSIS — F332 Major depressive disorder, recurrent severe without psychotic features: Secondary | ICD-10-CM

## 2020-07-13 DIAGNOSIS — F419 Anxiety disorder, unspecified: Secondary | ICD-10-CM

## 2020-07-13 NOTE — Telephone Encounter (Signed)
  Chronic Care Management   Note  Care Management   Outreach Note  07/13/2020 Name: [name] MRN: [id number] DOB: 03-Jan-1951  Referred by: Sid Falcon, MD Reason for referral : Chronic Care Management (HTN, HLD, Osteoarthritis)   An unsuccessful telephone outreach was attempted today. The patient was referred to the case management team for assistance with care management and care coordination.   Follow Up Plan:  Await return call and attempt to reschedule if patient does not return call.  [name], RN, BSN, CCM Care Management Coordinator [facility] Internal Medicine Phone: [phone] / Fax: [phone]

## 2020-07-13 NOTE — Telephone Encounter (Addendum)
  Chronic Care Management   Note  07/13/2020 Name: [name] MRN: [id number] DOB: 07/13/1950    Follow up plan: Received return call from patient and plan to reschedule assessment for tomorrow at 2:00.  Patient notes she is very depressed and she is going to bed at this time.  She stated she is living to take care of her dog and when "Mee Hives" passes, she will as well.  This RNCM asked patient if she has a plan to hurt herself at this time and she stated she did not.  Patient agreed to speak with this [facility] tomorrow.   Updated Dr. Theodis Shove on the conversation with patient and she was going to call her to assess.  Telephone follow up appointment with care management team member scheduled for: 07/14/20  [name], RN, BSN, CCM Care Management Coordinator [facility] Internal Medicine Phone: [phone] / Fax: [phone]

## 2020-07-14 ENCOUNTER — Ambulatory Visit: Payer: Medicare Other

## 2020-07-14 NOTE — BH Specialist Note (Addendum)
Integrated Behavioral Health via Telemedicine Visit  07/14/2020 DARLEAN WARMOTH [id number]  Number of Integrated Behavioral Health visits: 2/6 Session Start time: 3:00pm  Session End time: 3:50pm Total time: 51   Referring Provider: Dr. Gilles Chiquito, MD & today's Referral from [name], RN, BSN, CCM Patient/Family location: Pt @ home in private [facility] Provider location: [facility] Office All persons participating in visit: Pt & Clinician Types of Service: Individual psychotherapy  I connected with Armeda J Lumpkin and/or Capria J Ozimek's  self  via  Telephone or Geologist, engineering  (Video is Caregility application) and verified that I am speaking with the correct person using two identifiers. Discussed confidentiality:  n/a  I discussed the limitations of telemedicine and the availability of in person appointments.  Discussed there is a possibility of technology failure and discussed alternative modes of communication if that failure occurs.  I discussed that engaging in this telemedicine visit, they consent to the provision of behavioral healthcare and the services will be billed under their insurance.  Patient and/or legal guardian expressed understanding and consented to Telemedicine visit:  n/a  Presenting Concerns: Patient and/or family reports the following symptoms/concerns: elevated despair Duration of problem: months, but more intensity now; Severity of problem: moderate to severe; Pt expresses how nothing has gotten better in her life-she is dealing w/aging, tight financials, car needing repair, food for her dog. Pt is in state of psychological overwhelm. Pt has recently encountered an abrasive neighbor in her complex who c/o her walking her dog. The complaint was petty, but bothered Pt highly. This has triggered Pt upset today.  Discussed the possible need for medication review. Pt has verbalized passive SI with no intentions or plans for SA.  Pt sts, "The only reason I am here is for my dog Mee Hives who is blind & depends on me."  Pt is perseverative today & her thought pattern is circumstantial w/need for highly directed conversational pattern. This thought pattern exhibits the indecision manifested in psychological overwhelm situations. Once Pt is calmer, she may discriminate better btwn crucial vs trivial information.   Patient and/or Family's Strengths/Protective Factors: Concrete supports in place (healthy food, safe environments, etc.), Physical Health (exercise, healthy diet, medication compliance, etc.), and Pt has good sense of humor  Goals Addressed: Patient will:  Reduce symptoms of: agitation, anxiety, depression, and stress   Increase knowledge and/or ability of: coping skills, stress reduction, and improved boundary-setting w/others.    Demonstrate ability to: Increase healthy adjustment to current life circumstances and set firm & healthy boundaries w/other ppl in daily life.   Progress towards Goals: Ongoing  Interventions: Interventions utilized:  Supportive Counseling and Assessment for SI/SA. Standardized Assessments completed:  screeners prn  Patient and/or Family Response: Pt responsive to call this afternoon. Disordered thought pattern is noted. Pt requests future ck-in visits for mental health wellness.  Assessment: Patient currently experiencing heightened anxiety due to triggering event w/a Neighbor that has been ongoing for wks until his recent move out of the complex.   Patient may benefit from [facility], tapping or EMDR. Screener assessment using the ACE Questionnaire to assist in r/o of Schizoaffective or Bipolar d/o.   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   Pt has Hx of forehead trauma not identified as TBI. Pt may need further screening to r/o sig Pre-frontal impact as this could be organic reason for circumstantial thought d/o Sx.  Pt  describes family communication rift w/her only Dtr for  8 yrs. This stressor is quite significant.  Plan: Follow up with behavioral health clinician on : Nxt available 60 min Behavioral recommendations: f/u appt for screening & DBT skills Referral(s): [place] (In Clinic)  I discussed the assessment and treatment plan with the patient and/or parent/guardian. They were provided an opportunity to ask questions and all were answered. They agreed with the plan and demonstrated an understanding of the instructions.   They were advised to call back or seek an in-person evaluation if the symptoms worsen or if the condition fails to improve as anticipated.  [name], LMFT

## 2020-07-15 ENCOUNTER — Telehealth: Payer: Self-pay

## 2020-07-15 ENCOUNTER — Ambulatory Visit: Payer: Medicare Other | Admitting: Behavioral Health

## 2020-07-15 DIAGNOSIS — F419 Anxiety disorder, unspecified: Secondary | ICD-10-CM

## 2020-07-15 DIAGNOSIS — F332 Major depressive disorder, recurrent severe without psychotic features: Secondary | ICD-10-CM

## 2020-07-15 NOTE — Chronic Care Management (AMB) (Addendum)
Care Management    RN Visit Note  07/15/2020 Name: [name] MRN: [id number] DOB: [birth year]  Subjective: Elloise J Price is a 70 y.o. year old female who is a primary care patient of Sid Falcon, MD. The care management team was consulted for assistance with disease management and care coordination needs.    Engaged with patient by telephone for follow up visit in response to provider referral for case management and/or care coordination services.   Consent to Services:   [name] was given information about Care Management services today including:  Care Management services includes personalized support from designated clinical staff supervised by her physician, including individualized plan of care and coordination with other care providers 24/7 contact phone numbers for assistance for urgent and routine care needs. The patient may stop case management services at any time by phone call to the office staff.  Patient agreed to services and consent obtained.   Assessment: Patient is currently experiencing difficulty with chronic pain, depression and financial insecurities.. See Care Plan below for interventions and patient self-care actives. Follow up Plan: Patient would like continued follow-up.  CCM RNCM will outreach the patient within the next month  Patient will call office if needed prior to next encounter : Review of patient past medical history, allergies, medications, health status, including review of consultants reports, laboratory and other test data, was performed as part of comprehensive evaluation and provision of chronic care management services.   SDOH (Social Determinants of Health) assessments and interventions performed:  SDOH Interventions    Flowsheet Row Most Recent Value  SDOH Interventions   Financial Strain Interventions Patient Refused  Social Connections Interventions Patient Refused        Care Plan  Allergies  Allergen  Reactions   Penicillins     REACTION: rash and itching   Cephalexin Rash   Lamictal [Lamotrigine] Rash    Outpatient Encounter Medications as of 07/14/2020  Medication Sig Note   acetaminophen-codeine (TYLENOL #3) 300-30 MG tablet Take 1-2 tablets by mouth every 6 (six) hours as needed for moderate pain.    ALPRAZolam (XANAX) 0.5 MG tablet TAKE 1 TO 2 TABLETS BY MOUTH AS NEEDED FOR ANXIETY . DO NOT EXCEED 4 PER 24 HOURS    amLODipine (NORVASC) 2.5 MG tablet TAKE 1 TABLET BY MOUTH  DAILY    baclofen (LIORESAL) 10 MG tablet TAKE 1 TABLET BY MOUTH ONCE DAILY AS NEEDED FOR MUSCLE SPASM (Patient not taking: Reported on 05/27/2020)    buPROPion (WELLBUTRIN) 75 MG tablet TAKE 2 TABLETS BY MOUTH  TWICE DAILY 05/27/2020: Taking differently:  Taking 2 in the a.m. only   diclofenac Sodium (VOLTAREN) 1 % GEL Apply 4 g topically 4 (four) times daily.    escitalopram (LEXAPRO) 20 MG tablet TAKE 1 TABLET BY MOUTH  DAILY    fluticasone (FLONASE) 50 MCG/ACT nasal spray Place 1 spray into both nostrils daily for 14 days.    omeprazole (PRILOSEC) 20 MG capsule TAKE 1 CAPSULE BY MOUTH  DAILY    [DISCONTINUED] exenatide (BYETTA 5 MCG PEN) 5 MCG/0.02ML SOLN Inject 0.02 mLs (5 mcg total) into the skin 2 (two) times daily with a meal.    [DISCONTINUED] famotidine (PEPCID) 40 MG tablet Take 1 tablet (40 mg total) by mouth daily.    No facility-administered encounter medications on file as of 07/14/2020.    Patient Active Problem List   Diagnosis Date Noted   Forehead trauma, initial encounter 09/05/2019   Vitamin D  deficiency 04/18/2019   Substernal chest pain 04/18/2019   Obesity (BMI 30-39.9) 03/09/2017   Cataract 08/30/2016   Osteoarthritis of multiple joints 07/01/2014   GERD (gastroesophageal reflux disease) 05/28/2014   Pre-diabetes 12/17/2013   Chronic sinusitis 04/30/2013   Anxiety state 02/19/2013   Major depressive disorder, recurrent episode ([place]) 04/22/2012   Financial difficulties 02/19/2012    Routine adult health maintenance 11/06/2011   Vegetarian diet 08/18/2011   Diverticulosis 04/19/2011   Low back pain 06/17/2009   Hyperlipidemia 02/20/2008   HYPERTENSION, BENIGN ESSENTIAL 10/23/2007   Insomnia 03/28/2006    Conditions to be addressed/monitored: HTN and Chronic Pain management  Care Plan : Chronic Pain (Adult)  Updates made by [name], RN since 07/15/2020 12:00 AM     Problem: Pain Management Plan   Priority: High  Onset Date: 07/14/2020  Note:   Current Barriers: Patient has chronic back and knee pain and is currently taking large amounts if Ibuprofen (patient says 6-200mg  tabs 2x day) along with prescribed Tylenol with Codeine.  Educated on the dangers of taking high doses of Ibuprofen such as development of ulcers. Ineffective Self Health Maintenance  Patient with poor behavioral skills to cope with pain. Clinical Goal(s):  Inter-disciplinary care team collaboration (see longitudinal plan of care) patient will work with care management team to address care coordination and chronic disease management needs related to Care Coordination Other -Patient will keep a log of pain level and action.    Interventions:  Evaluation of current treatment plan related to HTN and Chronic Pain , Financial constraints related to her disability and Social Isolation self-management and patient's adherence to plan as established by provider. Collaboration with Sid Falcon, MD regarding development and update of comprehensive plan of care as evidenced by provider attestation       and co-signature Inter-disciplinary care team collaboration (see longitudinal plan of care) Discussed plans with patient for ongoing care management follow up and provided patient with direct contact information for care management team Self Care Activities:  Calls provider office for new concerns or questions Patient Goals:Learn alternate skills to cope with pain such as stretching,  activity.  Follow Up Plan: Telephone follow up appointment with care management team member scheduled for: July 12, 2020      Plan: Telephone follow up appointment with care management team member scheduled for:  August 11, 2020  [name], RN, BSN, CCM Care Management Coordinator [facility] Internal Medicine Phone: [phone] / Fax: [phone]

## 2020-07-15 NOTE — Patient Instructions (Signed)
Visit Information  PATIENT GOALS:   Goals Addressed             This Visit's Progress    Cope with Chronic Pain       Timeframe:  Long-Range Goal Priority:  High Start Date: 07/14/2020                         Expected End Date:   10/14/2020                    Follow Up Date 08/11/2020    - learn relaxation techniques - practice acceptance of chronic pain - practice relaxation or meditation daily - tell myself I can (not I can't) - use distraction techniques    Why is this important?   Stress makes chronic pain feel worse.  Feelings like depression, anxiety, stress and anger can make your body more sensitive to pain.  Learning ways to cope with stress or depression may help you find some relief from the pain.     Notes:       Manage Chronic Pain       Timeframe:  Long-Range Goal Priority:  High Start Date:07/14/2020                             Expected End Date:      10/14/2020                 Follow Up Date 08/11/2020    - keep track of prescription refills - plan exercise or activity when pain is best controlled - start a pain diary - track times pain is worst and when it is best    Why is this important?   Day-to-day life can be hard when you have chronic pain.  Pain medicine is just one piece of the treatment puzzle.  You can try these action steps to help you manage your pain.    Notes:       Track and Manage My Blood Pressure-Hypertension       Timeframe:  Long-Range Goal Priority:  High Start Date:                             Expected End Date:                       Follow Up Date 08/11/2020    - check blood pressure weekly - write blood pressure results in a log or diary    Why is this important?   You won't feel high blood pressure, but it can still hurt your blood vessels.  High blood pressure can cause heart or kidney problems. It can also cause a stroke.  Making lifestyle changes like losing a little weight or eating less salt will help.   Checking your blood pressure at home and at different times of the day can help to control blood pressure.  If the doctor prescribes medicine remember to take it the way the doctor ordered.  Call the office if you cannot afford the medicine or if there are questions about it.     Notes:          Ms. Luera was given information about Care Management services by the embedded care coordination team including:  Care Management services include personalized support from designated clinical staff supervised by  her physician, including individualized plan of care and coordination with other care providers 24/7 contact phone numbers for assistance for urgent and routine care needs. The patient may stop CCM services at any time (effective at the end of the month) by phone call to the office staff.  Patient agreed to services and verbal consent obtained.   The patient verbalized understanding of instructions, educational materials, and care plan provided today and declined offer to receive copy of patient instructions, educational materials, and care plan.   Telephone follow up appointment with care management team member scheduled for: 08/11/2020 [name], RN, BSN, CCM Care Management Coordinator [facility] Internal Medicine Phone: [phone] / Fax: [phone]

## 2020-07-15 NOTE — Telephone Encounter (Signed)
  Chronic Care Management   Note  07/15/2020  Name: [name] MRN: [id number] DOB: [birth year]   Received a voicemail message from patient that she is planning on stopping her Wellbutrin and Lexapro as she feels they are not helping her.  Patient noted she has been taking them for 10 years and she continues with depression and anxiety.  Patient requested this RNCM notify Dr. Gilles Chiquito and Dr. Theodis Shove.  Follow up plan: Notify doctors of call and attempt to reach patient to encourage her to come to clinic before she stops her anti-depressants.  [name], RN, BSN, CCM Care Management Coordinator [facility] Internal Medicine Phone: [phone] / Fax: [phone]

## 2020-07-15 NOTE — Telephone Encounter (Signed)
  Chronic Care Management   Note  07/15/2020 Name: [name] MRN: [id number] DOB: 09-Jan-1951 Call placed to patient after he message about stopping her antidepressants.  Patient shared that she is only stopping one antidepressant now and she is going to wean down on the other one at a later time.  Encouraged patient to make an appointment at the clinic or discuss with Dr. Theodis Shove on their call next week, the best way to taper down medication, if she is certain she wants to do so.  Patient was alert, laughing with this RNCM about her dog snoring and did not verbalize any thoughts of self harm today.   Follow up plan: Telephone follow up appointment with care management team member scheduled for: Dr. Theodis Shove 07/21/20 and Care Coordinator 08/11/20.  [name], RN, BSN, CCM Care Management Coordinator [facility] Internal Medicine Phone: [phone] / Fax: [phone]

## 2020-07-19 NOTE — Telephone Encounter (Signed)
Discussed in person with Dr. Theodis Shove and follow up plan made.

## 2020-07-21 ENCOUNTER — Ambulatory Visit: Payer: Medicare Other | Admitting: Behavioral Health

## 2020-07-21 ENCOUNTER — Other Ambulatory Visit: Payer: Self-pay | Admitting: Internal Medicine

## 2020-07-21 DIAGNOSIS — F332 Major depressive disorder, recurrent severe without psychotic features: Secondary | ICD-10-CM

## 2020-07-21 DIAGNOSIS — F419 Anxiety disorder, unspecified: Secondary | ICD-10-CM

## 2020-07-21 NOTE — BH Specialist Note (Signed)
Integrated Behavioral Health via Telemedicine Visit  07/21/2020 [name] [id number]  Number of [place] visits: 4/6 Session Start time: 10:00am  Session End time: 10:45am Total time: 45   Referring Provider: Dr. Gilles Chiquito, MD Patient/Family location: Pt is home in private [facility] Provider location: [facility] Office All persons participating in visit: Pt & Clinician Types of Service: Individual psychotherapy  I connected with [name] and/or [name]'s  self  via  Telephone or Geologist, engineering  (Video is Caregility application) and verified that I am speaking with the correct person using two identifiers. Discussed confidentiality: Yes   I discussed the limitations of telemedicine and the availability of in person appointments.  Discussed there is a possibility of technology failure and discussed alternative modes of communication if that failure occurs.  I discussed that engaging in this telemedicine visit, they consent to the provision of behavioral healthcare and the services will be billed under their insurance.  Patient and/or legal guardian expressed understanding and consented to Telemedicine visit: Yes   Presenting Concerns: Patient and/or family reports the following symptoms/concerns: elevated agitation/anxiety/dep. Pt is lonely & has little to no hope of things in her life changing.  Duration of problem: months to years Severity of problem: moderate to severe  Patient and/or Family's Strengths/Protective Factors: Pt has determination. Pt realizes the qualities in others that make her feel oppressed. Pt loves her dog & all animals. Pt recognizes she does not want anyone taking away her autonomy.  Goals Addressed: Patient will:  Reduce symptoms of: anxiety, depression, mood instability, stress, and anger rxns.    Increase knowledge and/or ability of: coping skills, healthy habits, self-management  skills, stress reduction, and characteristics common to Borderline PD so Pt can be active in relationships w/Family members to reduce incidents of estrangement & conflict.    Demonstrate ability to: Increase healthy adjustment to current life circumstances, Increase motivation to adhere to plan of care, and Improve medication compliance  Progress towards Goals: Ongoing  Interventions: Interventions utilized:  Supportive Counseling and DBT Dialectal Behavioral Therapy Standardized Assessments completed:  screeners prn  Patient and/or Family Response: Pt unreceptive to call today stating, "I don't get anything out of Cslg. I have no trust in ppl & I don't want help. I don't care if a health issue is going on."  Assessment: Patient currently experiencing highly elevated anxious hostility w/Clinician. She c/o lower back pain, but rejects attempts to get her into the [facility] to have Physician give her an Exam visit. Pt presents w/perseverative narrative about her dislike for others & how they treat her, her lack of need for being helped, & the emotional difficulty accepting her Dtr & Gchildren's lack of communication. Pt shares her stories in a circumstantial report w/limited discrimination btwn important/non-essential details. Pt is unable to have a reciprocal conversation w/Clinician without impatience & frustration.  Patient may benefit from psychoedu re: estb'g & maintaining the boundaries w/others that will benefit her mental health. Psychoedu re: tendencies in Borderline PD that make relationships complexly difficult.   Pt responds to laughter & description of her situation as having a big heart for others while binding anger around her heart to keep herself safe.  Plan: Follow up with behavioral health clinician on : at Pt's discretion Behavioral recommendations: None @ this time Referral(s): [place] (In Clinic)  I discussed the assessment and treatment plan with  the patient and/or parent/guardian. They were provided an opportunity to ask questions  and all were answered. They agreed with the plan and demonstrated an understanding of the instructions.   They were advised to call back or seek an in-person evaluation if the symptoms worsen or if the condition fails to improve as anticipated.  [name], LMFT

## 2020-07-21 NOTE — BH Specialist Note (Signed)
Integrated Behavioral Health via Telemedicine Visit  07/21/2020 [name] [id number]  Number of Integrated Behavioral Health visits: 3/6 Session Start time: 2:15pm  Session End time: 2:55pm Total time: 40   Referring Provider: [name], RN due to urgency Patient/Family location: Pt is home in private [facility] Provider location: [facility] Office All persons participating in visit: Pt & Clinician Types of Service: Other (Comment) [name], RN - requesting a mental health wellness check on Pt due to comment about hurting self w/a knife  I connected with Lara J Trostel and/or Amiaya J Marzan's  self  via  Telephone or Geologist, engineering  (Video is Caregility application) and verified that I am speaking with the correct person using two identifiers. Discussed confidentiality: Yes   I discussed the limitations of telemedicine and the availability of in person appointments.  Discussed there is a possibility of technology failure and discussed alternative modes of communication if that failure occurs.  I discussed that engaging in this telemedicine visit, they consent to the provision of behavioral healthcare and the services will be billed under their insurance.  Patient and/or legal guardian expressed understanding and consented to Telemedicine visit: Yes   Presenting Concerns: Patient and/or family reports the following symptoms/concerns: elevated anx/stress rxn due to unidentified new trigger or cont'd triggering from event discussed on 07/13/20 (documented in Progress Note) Duration of problem: weeks to months; Family issues noted for 8 yrs now w/Dtr; Severity of problem: moderate to severe  Patient and/or Family's Strengths/Protective Factors: Concrete supports in place (healthy food, safe environments, etc.), Pt has determination & staying power as issue w/Dtr & Lenon Ahmadi have lasted 8 yrs now after death of Dtr's Husb.   Goals  Addressed: Patient will:  Reduce symptoms of: anxiety, depression, mood instability, stress, and hostility    Increase knowledge and/or ability of: coping skills, self-management skills, stress reduction, and anger mgmt    Demonstrate ability to: Increase healthy adjustment to current life circumstances, Increase adequate support systems for patient/family, Increase motivation to adhere to plan of care, Improve medication compliance, and Begin healthy grieving over loss  Progress towards Goals: Ongoing  Interventions: Interventions utilized:  Supportive Counseling and Psychoeducation and/or Health Education Standardized Assessments completed:  screeners prn  Patient and/or Family Response: Pt surprised @ call today to secure her mental health welfare, but participated in call regardless. Pt presents as anxious & hostile w/intolerance for frustration when describing her neighbors & their beh.   Assessment: Patient currently experiencing elevated anx/dep over her time in life; feeling frustrated, angry, & questioning the choices of others she comes in contact with daily. This is limited to neighbors @ this time. Pt admits she is a, "whiner" & sts, "pills can't help, it is my whole life".  Pt explained her plan to stop her Wellbutrin gradually through July. She will remain on the Lexapro, but, "has been taking Wellbutrin for over 10 yrs & it is not helping".  Pt reassured Clinician she is, "not going to off myself". Pt has a dog that depends on her & keeps her living. Pt is neg for SI/SA & HI/HA.  Patient may benefit from consistent psychotherapy sessions to fortify her mental health. Agreed to appt next week on Wed, July 21, 2020. Encouraged Pt to make appt w/Dr. Daryll Drown, whom she has known for years.   Plan to r/o Borderline PD  Plan: Follow up with behavioral health clinician on : one week or first available for 60 min on telehealth Behavioral recommendations:  Get out w/the neighbor who  agreed to give her ride to the grocery for pet food. Try going to [place] find a book. Referral(s): [place] (In Clinic)  I discussed the assessment and treatment plan with the patient and/or parent/guardian. They were provided an opportunity to ask questions and all were answered. They agreed with the plan and demonstrated an understanding of the instructions.   They were advised to call back or seek an in-person evaluation if the symptoms worsen or if the condition fails to improve as anticipated.  [name], LMFT

## 2020-07-22 ENCOUNTER — Encounter: Payer: Self-pay | Admitting: Internal Medicine

## 2020-07-23 ENCOUNTER — Other Ambulatory Visit: Payer: Self-pay | Admitting: Internal Medicine

## 2020-07-27 ENCOUNTER — Encounter: Payer: Self-pay | Admitting: *Deleted

## 2020-07-28 ENCOUNTER — Encounter: Payer: Self-pay | Admitting: Internal Medicine

## 2020-07-28 MED ORDER — DICLOFENAC SODIUM 1 % EX GEL: 4.0000 g | Freq: Four times a day (QID) | CUTANEOUS | 1 refills | Status: DC

## 2020-08-11 ENCOUNTER — Telehealth: Payer: Medicare Other

## 2020-08-13 DIAGNOSIS — L720 Epidermal cyst: Secondary | ICD-10-CM | POA: Diagnosis not present

## 2020-08-13 DIAGNOSIS — Z1283 Encounter for screening for malignant neoplasm of skin: Secondary | ICD-10-CM | POA: Diagnosis not present

## 2020-08-13 DIAGNOSIS — D225 Melanocytic nevi of trunk: Secondary | ICD-10-CM | POA: Diagnosis not present

## 2020-08-23 ENCOUNTER — Other Ambulatory Visit: Payer: Self-pay

## 2020-08-23 DIAGNOSIS — F330 Major depressive disorder, recurrent, mild: Secondary | ICD-10-CM

## 2020-08-23 DIAGNOSIS — J00 Acute nasopharyngitis [common cold]: Secondary | ICD-10-CM

## 2020-08-23 NOTE — Telephone Encounter (Signed)
Received following message from front office staff:  This message is for you ladies.    Thanks,    Best boy  ----- Message -----  From: [name]  Sent: 08/22/2020   7:13 AM EDT  To: Imp Front Desk Pool  Subject: Appointment Request                              Appointment Request From: [name]    With Provider: Gilles Chiquito, MD [facility]]    Preferred Date Range: Any    Preferred Times: Any Time    Reason for visit: Office Visit    Comments:  Night sweats  Blood pressure   Also need refills on Xanax as well as Tylenol w codeine   Flonase   TC to patient, she c/o night sweats for a while, states she started having hot flashes when she was 70 years old and has been having night sweats for a very long time as well.  States she is not on HRT and wants to discuss this with MD.  Also c/o dizziness X 1 last week and wondering if it could be her b/p.  She wants an appt w/ PCP only, does not want to see anyone else.  States she was supposed to have a telehealth appt w/ PCP on 7/20, waited all day and never received a call.  She then saw on MyChart appt was cancelled.  RN informed pt PCP does not have any openings, but will route message to PCP to see if she might know of any possible upcoming additions to her schedule. Pt also asking for refills. Pain med last sent 07/21/20 LOV w/ MD 03/05/20 NOV 11/05/20 SChaplin, RN,BSN

## 2020-08-24 ENCOUNTER — Encounter: Payer: Self-pay | Admitting: Internal Medicine

## 2020-08-24 MED ORDER — FLUTICASONE PROPIONATE 50 MCG/ACT NA SUSP: 1.0000 | Freq: Every day | NASAL | 0 refills | Status: DC

## 2020-08-24 MED ORDER — ACETAMINOPHEN-CODEINE #3 300-30 MG PO TABS: ORAL_TABLET | ORAL | 0 refills | Status: DC

## 2020-08-24 MED ORDER — ALPRAZOLAM 0.5 MG PO TABS: ORAL_TABLET | ORAL | 2 refills | Status: DC

## 2020-08-24 NOTE — Telephone Encounter (Signed)
TC to patient and she was informed of Dr. Doristine Section instructions, she verbalized understanding. SChaplin, RN,BSN

## 2020-08-24 NOTE — Telephone Encounter (Signed)
She is on the schedule for Oct 14 and that is the earliest I have available.  She might need to come in to see a resident if she is concerned about her blood pressure.  Alternatively, she could present to a pharmacy with a blood pressure cuff and get it checked and send me the results.

## 2020-08-31 NOTE — Telephone Encounter (Signed)
I called pt - no answer; left message to call the office.

## 2020-08-31 NOTE — Telephone Encounter (Signed)
Please refer to patient's my chart message.  I am going to cancel her appointment.  Forwarding to PCP and triage so they speak with her about her current symptoms she is having.

## 2020-08-31 NOTE — Telephone Encounter (Signed)
I talked to pt who stated she prefers a female doctor. Also informed the last 2 residents she saw have graduated. She agreed to see Dr Laural Golden tomorrow 8/10 @ 1345 PM.

## 2020-09-01 ENCOUNTER — Encounter: Payer: Medicare Other | Admitting: Internal Medicine

## 2020-09-01 ENCOUNTER — Encounter: Payer: Medicare Other | Admitting: Student

## 2020-09-07 ENCOUNTER — Encounter: Payer: Self-pay | Admitting: Internal Medicine

## 2020-09-07 ENCOUNTER — Other Ambulatory Visit: Payer: Self-pay

## 2020-09-07 ENCOUNTER — Ambulatory Visit (INDEPENDENT_AMBULATORY_CARE_PROVIDER_SITE_OTHER): Payer: Medicare Other | Admitting: Internal Medicine

## 2020-09-07 VITALS — BP 145/75 | HR 87

## 2020-09-07 DIAGNOSIS — R61 Generalized hyperhidrosis: Secondary | ICD-10-CM

## 2020-09-07 DIAGNOSIS — F332 Major depressive disorder, recurrent severe without psychotic features: Secondary | ICD-10-CM

## 2020-09-07 DIAGNOSIS — R7303 Prediabetes: Secondary | ICD-10-CM

## 2020-09-07 DIAGNOSIS — I10 Essential (primary) hypertension: Secondary | ICD-10-CM | POA: Diagnosis not present

## 2020-09-07 LAB — POCT GLYCOSYLATED HEMOGLOBIN (HGB A1C): Hemoglobin A1C: 5.6 % (ref 4.0–5.6)

## 2020-09-07 NOTE — Assessment & Plan Note (Signed)
Last hemoglobin A1c 5.4 a year ago.  Will recheck today

## 2020-09-07 NOTE — Telephone Encounter (Signed)
Forwarding message to PCP and doctor who she will be seeing her this afternoon.

## 2020-09-07 NOTE — Assessment & Plan Note (Addendum)
Vitals:   09/07/20 1522  BP: (!) 145/75  Pulse: 87  SpO2: 97%   Patient reports blood pressure readings at home have been elevated.  She does endorse headaches but denies any chest pain, shortness of breath or lower extremity swelling.  She is currently on amlodipine 2.5 mg.  She is slightly above goal, will continue to monitor blood pressure and discuss possibly increasing to 5 mg if blood pressure remains elevated.  -Follow-up BMP

## 2020-09-07 NOTE — Assessment & Plan Note (Signed)
PHQ-9 18 today.  Patient denied any suicidal ideations during discussion however on her survey did indicate that she had suicidal thoughts.  During our discussion she stated that she felt very hopeless and had a lot of apathy towards life.  States the only reason she has to live is to outlive her dog who is blind.  She unfortunately has estranged children and grandchildren.  She does keep in touch with her sister-in-law and brother however does not have any desire to live near them.  She lives in a community however likes to keep to herself.  She tried therapy with Dr. Carolynne Edouard but states that she did not find this to be very helpful and does not think of therapy is anything more than a job.  She is weaned off of her Lexapro and Wellbutrin currently taking Lexapro every other day and Wellbutrin once daily.  Discussed that these medications are not causing her excessive sweating and recommended that she resume taking his medications as prescribed.  Patient is agreeable to this plan but would not like to continue therapy at this time.  -Continue Lexapro 20 mg daily and Wellbutrin 75 mg twice daily -Encourage therapy

## 2020-09-07 NOTE — Progress Notes (Signed)
   CC: excessive sweating, HTN, depression  HPI:  [name] is a 69 y.o. with a past medical history listed below presenting for evaluation of her excessive sweating, elevated blood pressures and depression medications. For details of today's visit and the status of his chronic medical issues please refer to the assessment and plan.   Past Medical History:  Diagnosis Date   Anxiety    Arthritis    Depression    GERD (gastroesophageal reflux disease)    PONV (postoperative nausea and vomiting)    Review of Systems: Negative except as per assessment and plan  Physical Exam:  Vitals:   09/07/20 1522  BP: (!) 145/75  Pulse: 87  SpO2: 97%    Physical Exam General: alert, appears stated age, in no acute distress HEENT: Normocephalic, atraumatic, EOM intact, conjunctiva normal CV: Regular rate and rhythm, no murmurs rubs or gallops Pulm: Clear to auscultation bilaterally, normal work of breathing Abdomen: Soft, nondistended, bowel sounds present, no tenderness to palpation MSK: No lower extremity edema Skin: Warm and dry Neuro: Alert and oriented x3   Assessment & Plan:   See Encounters Tab for problem based charting.  Patient discussed with Dr. Evette Doffing

## 2020-09-07 NOTE — Patient Instructions (Signed)
I will call you with any abnormal lab results.  Please follow-up in about 3 months regarding your depression.

## 2020-09-07 NOTE — Assessment & Plan Note (Signed)
Patient reports several months of excessive sweating and heat intolerance.  She feels that it is related in part due to the summer heat.  She was concerned that this was in part due to side effects from her Lexapro and Wellbutrin so she started weaning herself off of these medications.  She states that the excessive sweating is day and night whether she is inside or outside.  She keeps her thermometer at 62 F inside but still notices excessive sweating.  She denies any fevers, weight loss or weight gain, nausea, vomiting, abdominal pain, hair loss, diarrhea or constipation.  No changes to her urinary habits.  She has had no changes in her medications or diet, outside of weaning from her antidepressant medications.  Assessment/plan: Differential diagnosis includes heat intolerance versus hyperthyroid disease.  -Follow-up TSH -Counseled on hydration -Discussed it is okay to resume her Lexapro and Wellbutrin and that these medications are likely not contributing to patient's heat intolerance and excessive sweating

## 2020-09-08 LAB — BMP8+ANION GAP
Anion Gap: 17 mmol/L (ref 10.0–18.0)
BUN/Creatinine Ratio: 22 (ref 12–28)
BUN: 14 mg/dL (ref 8–27)
CO2: 24 mmol/L (ref 20–29)
Calcium: 9.9 mg/dL (ref 8.7–10.3)
Chloride: 101 mmol/L (ref 96–106)
Creatinine, Ser: 0.63 mg/dL (ref 0.57–1.00)
Glucose: 91 mg/dL (ref 65–99)
Potassium: 4.7 mmol/L (ref 3.5–5.2)
Sodium: 142 mmol/L (ref 134–144)
eGFR: 95 mL/min/{1.73_m2} (ref >59)

## 2020-09-08 LAB — TSH: TSH: 1.05 u[IU]/mL (ref 0.450–4.500)

## 2020-09-08 NOTE — Addendum Note (Signed)
Addended by: Mike Craze on: 09/08/2020 11:35 AM   Modules accepted: Level of Service

## 2020-09-08 NOTE — Progress Notes (Signed)
Internal Medicine Clinic Attending ° °Case discussed with Dr. Rehman  At the time of the visit.  We reviewed the resident’s history and exam and pertinent patient test results.  I agree with the assessment, diagnosis, and plan of care documented in the resident’s note.  ° °

## 2020-09-16 ENCOUNTER — Encounter: Payer: Self-pay | Admitting: Internal Medicine

## 2020-09-21 ENCOUNTER — Encounter: Payer: Medicare Other | Admitting: Internal Medicine

## 2020-09-22 ENCOUNTER — Encounter: Payer: Medicare Other | Admitting: Internal Medicine

## 2020-09-22 NOTE — Telephone Encounter (Signed)
Pt was called about her appt today and time - no answer; left message.

## 2020-09-24 ENCOUNTER — Other Ambulatory Visit: Payer: Self-pay | Admitting: Internal Medicine

## 2020-09-24 ENCOUNTER — Encounter: Payer: Self-pay | Admitting: Internal Medicine

## 2020-09-24 DIAGNOSIS — J00 Acute nasopharyngitis [common cold]: Secondary | ICD-10-CM

## 2020-09-28 NOTE — Telephone Encounter (Signed)
Last office visit: 08/16/220 (Dr Laural Golden) Last Written: 08/02 #70 Next appt: With Internal Medicine Gilles Chiquito, MD)11/05/2020 at 10:15 AM

## 2020-09-29 MED ORDER — FLUTICASONE PROPIONATE 50 MCG/ACT NA SUSP
1.0000 | Freq: Every day | NASAL | 0 refills | Status: DC
Start: 2020-09-29 — End: 2020-12-08

## 2020-09-29 MED ORDER — ACETAMINOPHEN-CODEINE #3 300-30 MG PO TABS: ORAL_TABLET | ORAL | 0 refills | Status: DC

## 2020-09-30 ENCOUNTER — Encounter: Payer: Medicare Other | Admitting: Internal Medicine

## 2020-10-29 ENCOUNTER — Other Ambulatory Visit: Payer: Self-pay | Admitting: Internal Medicine

## 2020-10-29 NOTE — Telephone Encounter (Signed)
[place], Lella J "Jean"  P Imp Triage Nurse Pool (supporting Susette Racer A) 7 minutes ago (1:42 PM)   Dr. Daryll Drown,  I am almost out of the Tylenol w Codeine.  I called [facility] & there are no refills.  This always happens on the weekends.  I have a refill left on the Xanax, so that's good.   I hope you get this before you leave for the weekend .  I have an appointment to see you next week.  Thank you Jeans

## 2020-11-03 ENCOUNTER — Encounter: Payer: Self-pay | Admitting: Internal Medicine

## 2020-11-03 DIAGNOSIS — J00 Acute nasopharyngitis [common cold]: Secondary | ICD-10-CM

## 2020-11-05 ENCOUNTER — Other Ambulatory Visit: Payer: Self-pay

## 2020-11-05 ENCOUNTER — Encounter: Payer: Self-pay | Admitting: Internal Medicine

## 2020-11-05 ENCOUNTER — Ambulatory Visit (INDEPENDENT_AMBULATORY_CARE_PROVIDER_SITE_OTHER): Payer: Medicare Other | Admitting: Internal Medicine

## 2020-11-05 VITALS — BP 142/78 | HR 88 | Temp 98.4°F | Ht 61.0 in | Wt 179.1 lb

## 2020-11-05 DIAGNOSIS — Z789 Other specified health status: Secondary | ICD-10-CM

## 2020-11-05 DIAGNOSIS — M545 Low back pain, unspecified: Secondary | ICD-10-CM

## 2020-11-05 DIAGNOSIS — F332 Major depressive disorder, recurrent severe without psychotic features: Secondary | ICD-10-CM

## 2020-11-05 DIAGNOSIS — I10 Essential (primary) hypertension: Secondary | ICD-10-CM | POA: Diagnosis not present

## 2020-11-05 DIAGNOSIS — Z0000 Encounter for general adult medical examination without abnormal findings: Secondary | ICD-10-CM | POA: Diagnosis not present

## 2020-11-05 DIAGNOSIS — K219 Gastro-esophageal reflux disease without esophagitis: Secondary | ICD-10-CM | POA: Diagnosis not present

## 2020-11-05 DIAGNOSIS — E559 Vitamin D deficiency, unspecified: Secondary | ICD-10-CM

## 2020-11-05 DIAGNOSIS — F411 Generalized anxiety disorder: Secondary | ICD-10-CM

## 2020-11-05 DIAGNOSIS — G8929 Other chronic pain: Secondary | ICD-10-CM

## 2020-11-05 MED ORDER — AMLODIPINE BESYLATE 5 MG PO TABS: 5.0000 mg | ORAL_TABLET | Freq: Every day | ORAL | 3 refills | Status: DC

## 2020-11-05 NOTE — Assessment & Plan Note (Signed)
This is another issue we discussed today in conjunction with anxiety.  She has a very high PHQ-9 score, but notes no intention to harm herself.  She has long standing issues with family and very little support there.  She is taking lexapro and bupropion with little help.  She has met with our counselor once but did not find it helpful.  She has declined further evaluation by psychiatry.  I advised her to continue taking her medications and consider seeking out counseling in the future.   Continue xanax, lexapro, bupropion.

## 2020-11-05 NOTE — Assessment & Plan Note (Signed)
She continues to decline any cancer screening.  She did ask for her DNR form to be updated which we did today.

## 2020-11-05 NOTE — Assessment & Plan Note (Signed)
BP is elevated today.  She notes that she always take her BP medication.  She is on a low dose of amlodipine.  She does not have any chest pain, dizziness or lightheadedness.    Plan Increase amlodipine to 5mg  daily

## 2020-11-05 NOTE — Assessment & Plan Note (Signed)
Her Vitamin D level has been low.  I advised her again to start taking supplementation.  She declined checking of levels today.

## 2020-11-05 NOTE — Assessment & Plan Note (Signed)
This is the main thing we spoke about today.  She is not interested in surgery.  Codeine makes her itch.  I explained that this would likely be an issue with all opiates. We discussed other options such as injectable therapy and seeing an orthopedist or a neurosurgeon.  We reviewed her imaging.  We also discussed options such as gabapentin or changing her anti-depressant to another medication which may help with pain.  She was not interested in these options today.  She hinted at diversion by giving her unused medications to a friend in her building, so would be prudent to not further provide opiates in the future.  She noted that she will just keep taking advil.  I advised her to continue taking this medication as recommended on the bottle.  I also advised her to continue to take her medication with food and omeprazole.

## 2020-11-05 NOTE — Progress Notes (Signed)
Subjective:    Patient ID: [name], female    DOB: 02/06/1950, 70 y.o.   MRN: [id number]  3 month follow up for pain management, HTN  HPI  [name] is a 70 year old woman with PMH Of HTN, chronic pain due to OA, anxiety, depression, low vitamin D who presents for follow up.   [name] would like to change her pain regimen today.  She notes that the tylenol #3 does not help well with her.  She is also taking advil, and has taken in large quantities before.  She also feels that the codeine makes her itchy and her headaches are not improved.  We discussed multiple options including tramadol, gabapentin and referral for injectable therapy today.   Her BP is mildly elevated today.  She notes taking her amlodipine daily.  She has no dizziness, lightheadedness, falls.  She has no lower extremity swelling.   Otherwise, continues with a very high PHQ-9 score.  She is on lexapro and bupropion which she feels do not do much.  She has spoken with a counselor, but doesn't feel like this helps.  This is a chronic and long standing issue.  She is not interested in seeing psychiatry.  She does note some paranoid thinking today including thinking COVID is a hoax for the government to control the population.   Review of Systems  Constitutional:  Negative for activity change, appetite change, fatigue, fever and unexpected weight change.  Eyes:  Negative for discharge and visual disturbance.  Respiratory:  Negative for cough.   Cardiovascular:  Negative for chest pain and leg swelling.  Gastrointestinal:  Negative for abdominal distention, blood in stool and constipation.  Musculoskeletal:  Positive for arthralgias, back pain and myalgias.  Skin:  Negative for color change and pallor.  Neurological:  Positive for headaches. Negative for dizziness, weakness and light-headedness.  Psychiatric/Behavioral:  Positive for dysphoric mood. The patient is nervous/anxious.        Paranoid thinking.         Objective:   Physical Exam Vitals and nursing note reviewed.  Constitutional:      General: She is not in acute distress.    Appearance: Normal appearance. She is obese. She is not toxic-appearing.  HENT:     Head: Normocephalic and atraumatic.  Cardiovascular:     Rate and Rhythm: Normal rate and regular rhythm.     Pulses: Normal pulses.     Heart sounds: No murmur heard.   No friction rub.  Pulmonary:     Effort: Pulmonary effort is normal. No respiratory distress.     Breath sounds: Normal breath sounds. No wheezing.  Abdominal:     General: Bowel sounds are normal. There is no distension.     Palpations: Abdomen is soft.     Tenderness: There is no abdominal tenderness.  Musculoskeletal:        General: No swelling or tenderness.  Skin:    General: Skin is warm and dry.  Neurological:     Mental Status: She is alert and oriented to person, place, and time. Mental status is at baseline.  Psychiatric:        Attention and Perception: Attention normal.        Mood and Affect: Affect is angry.        Speech: Speech normal.        Behavior: Behavior normal.        Thought Content: Thought content is paranoid.  Vitamin D level today, patient declined      Assessment & Plan:  Return in 3-4 months.

## 2020-11-05 NOTE — Assessment & Plan Note (Signed)
This is another issue we discussed today in conjunction with MDD.  She has a very high PHQ-9 score, but notes no intention to harm herself.  She has long standing issues with family and very little support there.  She is taking lexapro and bupropion with little help.  She has met with our counselor once but did not find it helpful.  She has declined further evaluation by psychiatry.  I advised her to continue taking her medications and consider seeking out counseling in the future.   Continue xanax, lexapro, bupropion.

## 2020-11-05 NOTE — Patient Instructions (Signed)
[name] - -  Thank you for coming in today.   Please stop taking tylenol #3 and benadryl.   Please continue to take Advil only as recommended on the bottle.  If you are interested in referral to Orthopedic Surgery for injection therapy for your back, please let me know and I will place the referral.   Please increase your amlodipine to 5mg  daily.  A new prescription has been sent to the pharmacy.   Please come back to see me in 3-4 months.  Please consider starting screening for Colon Cancer and Breast Cancer.   Thank you!

## 2020-11-05 NOTE — Assessment & Plan Note (Signed)
Well managed with prilosec, which will be continued today.

## 2020-11-08 ENCOUNTER — Telehealth: Payer: Self-pay | Admitting: Internal Medicine

## 2020-11-08 NOTE — Telephone Encounter (Signed)
Pt requesting a call back about her medications.   

## 2020-11-09 NOTE — Telephone Encounter (Signed)
Return pt's call - stated she can not remember what she had called about.

## 2020-11-19 ENCOUNTER — Other Ambulatory Visit: Payer: Self-pay | Admitting: Internal Medicine

## 2020-11-22 ENCOUNTER — Encounter: Payer: Self-pay | Admitting: Internal Medicine

## 2020-11-23 ENCOUNTER — Encounter: Payer: Self-pay | Admitting: Internal Medicine

## 2020-11-23 DIAGNOSIS — M159 Polyosteoarthritis, unspecified: Secondary | ICD-10-CM

## 2020-11-24 ENCOUNTER — Telehealth: Payer: Self-pay

## 2020-11-24 NOTE — Telephone Encounter (Signed)
Requesting to speak with a nurse about knee pain. Pt is sch to come in on Friday. Please call pt back.

## 2020-11-24 NOTE — Telephone Encounter (Signed)
Return pt's call. Informed pt her MY Chart message was forwarded to Dr Daryll Drown. She stated she's unable to walk on her knee; she has been using ice. Pt has an appt on Friday @ 0945 AM.

## 2020-11-25 MED ORDER — GABAPENTIN 300 MG PO CAPS: 300.0000 mg | ORAL_CAPSULE | Freq: Every day | ORAL | 2 refills | Status: DC

## 2020-11-26 ENCOUNTER — Telehealth: Payer: Self-pay | Admitting: *Deleted

## 2020-11-26 ENCOUNTER — Encounter: Payer: Medicare Other | Admitting: Internal Medicine

## 2020-11-26 NOTE — Telephone Encounter (Signed)
SPOKE WITH PATIENT REGARDING HER MISSED APPOINTMENT/PATIENT STATES SHE MADE A NOTE IN HER "MY CHART" THAT SHE CANCELED HER APPOINTMENT FOR TODAY.

## 2020-11-29 ENCOUNTER — Other Ambulatory Visit: Payer: Self-pay | Admitting: Internal Medicine

## 2020-11-29 DIAGNOSIS — F330 Major depressive disorder, recurrent, mild: Secondary | ICD-10-CM

## 2020-12-03 ENCOUNTER — Telehealth: Payer: Self-pay

## 2020-12-03 NOTE — Telephone Encounter (Signed)
2nd TC to patient, RN clarified that she was not offered an appt in [facility] today, there are no appt's available and RN instructed pt to go to the ED for evaluation.  Patient states she understood those instructions, but has not been able to arrange transportation to the ED from any of her friends.  RN reiterated she needs to call 911 for transport.  Patient states "Will they let me bring my dog with me"? RN informed patient they do not allow dogs on the ambulance or hospital.  RN informed patient that MD/IMC's recommendation is to call 911 for transport to ED to r/o DVT.  Educated patient that DVT's can break off and turn into Pe's which can be fatal.  Patient states "good". Patient states she will call 911. SChaplin, RN,BSN

## 2020-12-03 NOTE — Telephone Encounter (Signed)
Pt called back she stated that she spoke to a RN who gave her instruction on what to do about her leg pain .Marland Kitchen But she cant drive or has any one to bring her in for an appt today .. pt stated that if she gets up to shower get dress and come here she wont make it  because she cant move .. also that she has to walk her dog  but mainly because she cant drive

## 2020-12-03 NOTE — Telephone Encounter (Signed)
RN was notified that patient had sent a message to front desk c/o leg pain and that she could not walk. PCP in office, RN discussed this with PCP, as there is documentation that PCP has instructed patient to either have in person appt or ED evaluation for these complaints. RN placed telephone appt to patient and reiterated PCP's instructions that if she is having severe leg pain and cannot walk, she should be evaluated in ED to r/o DVT (no openings at [facility] today).  Pt states "I did manage to walk a little and take my dog out earlier".  Can I drive myself to the ED.  RN advised patient not to drive herself and to call EMS.  Pt states she will get someone to take her or call EMS. SChaplin, RN,BSN

## 2020-12-09 ENCOUNTER — Other Ambulatory Visit: Payer: Self-pay | Admitting: Internal Medicine

## 2020-12-09 ENCOUNTER — Ambulatory Visit (INDEPENDENT_AMBULATORY_CARE_PROVIDER_SITE_OTHER): Payer: Medicare Other | Admitting: Student

## 2020-12-09 ENCOUNTER — Encounter: Payer: Self-pay | Admitting: Student

## 2020-12-09 ENCOUNTER — Encounter: Payer: Self-pay | Admitting: Internal Medicine

## 2020-12-09 ENCOUNTER — Other Ambulatory Visit: Payer: Self-pay

## 2020-12-09 DIAGNOSIS — F332 Major depressive disorder, recurrent severe without psychotic features: Secondary | ICD-10-CM | POA: Diagnosis not present

## 2020-12-09 MED ORDER — FLUTICASONE PROPIONATE 50 MCG/ACT NA SUSP: 1.0000 | Freq: Every day | NASAL | 0 refills | Status: DC

## 2020-12-09 NOTE — Telephone Encounter (Signed)
Please see my mychart note.  Can Triage also call the patient and reinforce the advice I sent to her?  Thank you!

## 2020-12-09 NOTE — Telephone Encounter (Addendum)
Call placed to patient at request of PCP. Above note read to patient. She stated she stopped the gabapentin as it made her feel like she was having a "meltdown." Also, states, "I don't feel sick, I think I'm ok, I'm not going to kill myself, it's just anxiety, and I don't want to be whiny or a pain." States, "My heart was racing this morning, so I took a xanax which I normally only take at night." "I don't have a headache, or chest pain." States, "I'm just depressed, I'm doing laundry, trying to keep busy." "I'm really worried about my dog, if anything happens to me I don't want her to go back to the shelter. I got her there 5 years ago and she's blind and really bonded with me."   Reassured patient she is not a pain or being whiny. We care for her and are concerned. Reiterated need to be seen today in clinic. She agreed to come to clinic this afternoon. Placed on Dr. Tenny Craw schedule at 1345. Patient was very appreciative of call and concern shown.

## 2020-12-09 NOTE — Patient Instructions (Signed)
Thank you, [name] for allowing [place] to provide your care today. Today we discussed .  Thank you for sharing your stories with me. If you have any feelings of chest pain, shortness of breath, numbness, tingling, or any new symptoms, please call our clinic.     I have ordered the following labs for you:  Lab Orders  No laboratory test(s) ordered today     Referrals ordered today:   Referral Orders  No referral(s) requested today     I have ordered the following medication/changed the following medications:   Stop the following medications: There are no discontinued medications.   Start the following medications: No orders of the defined types were placed in this encounter.    Follow up: 2-3 months with Dr. Daryll Drown   Should you have any questions or concerns please call the internal medicine clinic at [phone].    Sanjuana Letters, D.O. [place]

## 2020-12-10 NOTE — Progress Notes (Signed)
CC: Feeling of impending doom, racing heart rate  HPI:  [name] is a 70 y.o. female with a past medical history stated below and presents today for follow-up after patient had event upon waking of impending doom and having a racing heart rate and not feeling well. Please see problem based assessment and plan for additional details.  Past Medical History:  Diagnosis Date   Anxiety    Arthritis    Depression    GERD (gastroesophageal reflux disease)    PONV (postoperative nausea and vomiting)     Current Outpatient Medications on File Prior to Visit  Medication Sig Dispense Refill   ALPRAZolam (XANAX) 0.5 MG tablet TAKE 1 TO 2 TABLETS BY MOUTH AS NEEDED FOR ANXIETY . DO NOT EXCEED 4 PER 24 HOURS 120 tablet 2   amLODipine (NORVASC) 5 MG tablet Take 1 tablet (5 mg total) by mouth daily. 90 tablet 3   buPROPion (WELLBUTRIN) 75 MG tablet TAKE 2 TABLETS BY MOUTH  TWICE DAILY 360 tablet 3   diclofenac Sodium (VOLTAREN) 1 % GEL APPLY 4 GRAMS TOPICALLY 4 TIMES DAILY 300 g 0   escitalopram (LEXAPRO) 20 MG tablet TAKE 1 TABLET BY MOUTH  DAILY 90 tablet 3   fluticasone (FLONASE) 50 MCG/ACT nasal spray Place 1 spray into both nostrils daily for 14 days. 11.1 mL 0   gabapentin (NEURONTIN) 300 MG capsule Take 1 capsule (300 mg total) by mouth at bedtime. 30 capsule 2   omeprazole (PRILOSEC) 20 MG capsule TAKE 1 CAPSULE BY MOUTH  DAILY 90 capsule 3   [DISCONTINUED] exenatide (BYETTA 5 MCG PEN) 5 MCG/0.02ML SOLN Inject 0.02 mLs (5 mcg total) into the skin 2 (two) times daily with a meal. 1.2 mL 3   [DISCONTINUED] famotidine (PEPCID) 40 MG tablet Take 1 tablet (40 mg total) by mouth daily. 30 tablet 11   No current facility-administered medications on file prior to visit.    Family History  Problem Relation Age of Onset   Cancer Mother 22       Lung   Parkinsonism Father    Dementia Father    Drug abuse Brother    Depression Brother     Social History   Socioeconomic  History   Marital status: Single    Spouse name: Not on file   Number of children: Not on file   Years of education: Not on file   Highest education level: Not on file  Occupational History   Not on file  Tobacco Use   Smoking status: Never   Smokeless tobacco: Never  Substance and Sexual Activity   Alcohol use: Not Currently    Alcohol/week: 0.0 standard drinks   Drug use: No   Sexual activity: Not on file  Other Topics Concern   Not on file  Social History Narrative   Current Social History 05/27/2020        Patient lives alone in an apartment which is 1 story. There are not steps up to the entrance.       Patient's method of transportation is personal car.      The highest level of education was some college.      The patient currently retired.      Identified important Relationships are "My dog"       Pets : one dog       Interests / Fun: "being with my dog, I don't like people"       Current Stressors: poor family  relationships       Religious / Personal Beliefs: "I believe in God"      Social Determinants of Health   Financial Resource Strain: High Risk   Difficulty of Paying Living Expenses: Very hard  Food Insecurity: No Food Insecurity   Worried About Charity fundraiser in the Last Year: Never true   Arboriculturist in the Last Year: Never true  Transportation Needs: Not on file  Physical Activity: Not on file  Stress: Not on file  Social Connections: Socially Isolated   Frequency of Communication with Friends and Family: Twice a week   Frequency of Social Gatherings with Friends and Family: Never   Attends Religious Services: Never   Marine scientist or Organizations: No   Attends Music therapist: 1 to 4 times per year   Marital Status: Divorced  Human resources officer Violence: Not on file    Review of Systems: ROS negative except for what is noted on the assessment and plan.  Vitals:   12/09/20 1427  BP: 132/74  Pulse: 100   Resp: (!) 24  Temp: 97.8 F (36.6 C)  TempSrc: Oral  SpO2: 98%  Weight: 177 lb 14.4 oz (80.7 kg)  Height: 5\' 1"  (1.549 m)   Physical Exam: Constitutional: No acute distress HENT: normocephalic atraumatic, mucous membranes moist Eyes: conjunctiva non-erythematous Neck: supple Cardiovascular: regular rate and rhythm, no m/r/g Pulmonary/Chest: normal work of breathing on room air, lungs clear to auscultation bilaterally MSK: normal bulk and tone Neurological: alert & oriented x 3, 5/5 strength in bilateral upper and lower extremities, normal gait Skin: warm and dry Psych: Tearful affect, depressed mood, no active suicidal thoughts  Assessment & Plan:   See Encounters Tab for problem based charting.  Patient discussed with Dr. Newell Coral, D.O. [facility], PGY-2 Pager: [phone], Phone: [phone] Date 12/10/2020 Time 6:00 PM

## 2020-12-10 NOTE — Assessment & Plan Note (Addendum)
Assessment: Patient sent message to her PCP that she had a feeling of impending doom and woke up not feeling well as well as having a racing heart rate.  She states that these occur periodically up to 3 times a week.  She believes that they are associated with her ongoing depression and anxiety.  Patient states these occur more frequently around the holidays as she feels as though she has no one to celebrate with.  She feels as though the holidays are not a time to celebrate for her.  We discussed her longstanding history of depression and that she feels as though the only thing she has with living is her dog.  She reflected on her life with having multiple [employer]'s and being able to spend time with her son-in-law, daughter and grandchildren.  Since her son-in-law passed away she has not spoken to her daughter.  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  She talks about taking trips with them to [facility] as well as to go get gas.  Try to talk about the positives in life however patient seem to avoid this.  She is not actively suicidal but at times she feels as though she wishes that she fell asleep and did not wake up.  She denies active suicidal plan, when asked how she would kill herself she is unsure.  She has tried counseling with Dr. Carolynne Edouard in the past however she did not find beneficial.  Discussed with patient that our clinic is always here to listen whenever she needs as the holidays will be a difficult time in these upcoming months.  I do not believe her chest pain or feeling of impending doom is secondary to cardiac or neurologic etiology.  Cardiac and neurological examination unremarkable.  I do believe patient will need very close follow-up with these next few weeks during the holidays.  We will reach out to patient's PCP to discuss this matter.  Plan: -Continue medical management for major depression and follow-up with PCP  closely -Consider uptitrating Wellbutrin -Consider biweekly phone calls with the patient to check on her during the holidays

## 2020-12-20 NOTE — Progress Notes (Signed)
Internal Medicine Clinic Attending  Case discussed at length with Dr. Johnney Ou  At the time of the visit.  We reviewed the resident's history and exam and pertinent patient test results.  I agree with the assessment, diagnosis, and plan of care documented in the resident's note.

## 2020-12-25 ENCOUNTER — Other Ambulatory Visit: Payer: Self-pay | Admitting: Internal Medicine

## 2020-12-25 DIAGNOSIS — F419 Anxiety disorder, unspecified: Secondary | ICD-10-CM

## 2020-12-25 DIAGNOSIS — K219 Gastro-esophageal reflux disease without esophagitis: Secondary | ICD-10-CM

## 2020-12-25 DIAGNOSIS — F3341 Major depressive disorder, recurrent, in partial remission: Secondary | ICD-10-CM

## 2020-12-30 ENCOUNTER — Encounter: Payer: Self-pay | Admitting: Physical Medicine and Rehabilitation

## 2020-12-30 ENCOUNTER — Other Ambulatory Visit: Payer: Self-pay

## 2020-12-30 ENCOUNTER — Ambulatory Visit (INDEPENDENT_AMBULATORY_CARE_PROVIDER_SITE_OTHER): Payer: Medicare Other | Admitting: Physical Medicine and Rehabilitation

## 2020-12-30 DIAGNOSIS — M47816 Spondylosis without myelopathy or radiculopathy, lumbar region: Secondary | ICD-10-CM | POA: Diagnosis not present

## 2020-12-30 DIAGNOSIS — M5416 Radiculopathy, lumbar region: Secondary | ICD-10-CM | POA: Diagnosis not present

## 2020-12-30 DIAGNOSIS — G8929 Other chronic pain: Secondary | ICD-10-CM

## 2020-12-30 DIAGNOSIS — M5442 Lumbago with sciatica, left side: Secondary | ICD-10-CM

## 2020-12-30 DIAGNOSIS — M5441 Lumbago with sciatica, right side: Secondary | ICD-10-CM | POA: Diagnosis not present

## 2020-12-30 DIAGNOSIS — M48062 Spinal stenosis, lumbar region with neurogenic claudication: Secondary | ICD-10-CM

## 2020-12-30 NOTE — Progress Notes (Signed)
Pt state lower back pain that travels to both knees. Pt state standing and walking makes the pain worse. Pt state she takes pain meds and uses heat to help ease her pain.  Numeric Pain Rating Scale and Functional Assessment Average Pain 10 Pain Right Now 5 My pain is intermittent, dull, stabbing, and aching Pain is worse with: walking, standing, and some activites Pain improves with: heat/ice and medication   In the last MONTH (on 0-10 scale) has pain interfered with the following?  1. General activity like being  able to carry out your everyday physical activities such as walking, climbing stairs, carrying groceries, or moving a chair?  Rating(7)  2. Relation with others like being able to carry out your usual social activities and roles such as  activities at home, at work and in your community. Rating(8)  3. Enjoyment of life such that you have  been bothered by emotional problems such as feeling anxious, depressed or irritable?  Rating(9)

## 2020-12-30 NOTE — Progress Notes (Signed)
[name] - 70 y.o. female MRN [id number]  Date of birth: Apr 18, 1950  Office Visit Note: Visit Date: 12/30/2020 PCP: Sid Falcon, MD Referred by: Sid Falcon, MD  Subjective: Chief Complaint  Patient presents with   Lower Back - Pain   Right Knee - Pain   Left Knee - Pain   HPI: [name] is a 70 y.o. female who comes in today per the request of Dr. Gilles Chiquito for evaluation of chronic, worsening and severe bilateral lower back pain radiating to buttocks and posterior thighs.  Patient reports pain is exacerbated by walking and prolonged standing, describes pain as a soreness sensation and currently rates as 8 out of 10.  Patient reports some relief of pain with home exercise regimen, rest, sitting and use of Tylenol and ibuprofen as needed. Patient was previously seen by Dr. Stefanie Libel of [facility] in 2014 whom recommended surgical consultation and formal physical therapy treatment.  Patient states she did consult with spine surgeon at that time whom did not recommend intervention.  Patient also reports she did not attend formal physical therapy due to financial issues.  Patient's lumbar MRI from 2014 exhibits multi-level facet arthropathy, moderate spinal canal stenosis at L2-L3 and prominent spinal canal stenosis noted at L3-L4.  Patient states the discomfort to posterior thighs is the most severe and is keeping her from getting adequate sleep.  Patient states she currently resides in a retirement community and does have trouble completing daily tasks such as housework and grocery shopping due to severe pain.  Patient denies focal weakness, numbness and tingling.  Patient denies recent trauma or falls.  Review of Systems  Musculoskeletal:  Positive for back pain.  Neurological:  Negative for tingling, sensory change, focal weakness and weakness.  All other systems reviewed and are negative. Otherwise per HPI.  Assessment & Plan: Visit  Diagnoses:    ICD-10-CM   1. Lumbar radiculopathy  M54.16 MR LUMBAR SPINE WO CONTRAST    2. Chronic midline low back pain with bilateral sciatica  M54.41    M54.42    G89.29        Plan: Findings:  Chronic, worsening and severe bilateral lower back pain radiating to buttocks and posterior thighs.  Patient continues to have severe pain despite good conservative therapies such as home exercise regimen, rest and use of medications as needed.  Patient's clinical presentation and exam are consistent with neurogenic claudication as result of spinal canal stenosis.  Her lumbar MRI from 2014 does exhibit moderate spinal canal stenosis at L2-L3 and prominent spinal canal stenosis at L3-L4.  Due to patient's declining functionality and increasing pain we believe the next step is to obtain new lumbar MRI imaging which we did place an order for today.  We will have patient follow back up in the office after lumbar MRI imaging is complete for review and to discuss further treatment options.  We do feel like patient would be a good candidate for lumbar epidural injections if warranted.  We encourage patient to remain active and to continue home exercise regimen as tolerated.  No red flag symptoms noted upon exam today.   Meds & Orders: No orders of the defined types were placed in this encounter.   Orders Placed This Encounter  Procedures   MR LUMBAR SPINE WO CONTRAST    Follow-up: Return for follow-up after lumbar MRI is completed .   Procedures: No procedures performed  Clinical History: MRI LUMBAR SPINE WITHOUT CONTRAST   Technique:  Multiplanar and multiecho pulse sequences of the lumbar  spine were obtained without intravenous contrast.   Comparison: 08/28/2012   Findings: Numerous gallstones suspected in the gallbladder.  The  lowest full intervertebral disk space is labeled L5-S1.  If  procedural intervention is to be performed, careful correlation  with this numbering strategy is  recommended.   The conus medullaris appears unremarkable.  Conus level:  L1.   Disc desiccation noted at L4-5 L5-S1 with loss of disc height.  There is 4 mm retrolisthesis at L1-2 and 4 mm anterolisthesis at L3-  4.  Mild physiologic anterior wedging noted at L1.   Mild central stenosis and probable right foraminal stenosis at T11-  12 at the extreme upper margin of parasagittal images. Additional  findings at individual levels are as follows:   T12-L1:  Unremarkable.   L1-2:  Moderate left eccentric central stenosis along with  borderline left foraminal stenosis and mild left subarticular  lateral recess stenosis due to left paracentral and lateral recess  disc protrusion along with disc bulge.  There is also a disc  extrusion or disc fragment extending cephalad from the L1-2  vertebral level posterior to the L2 vertebral body, causing mild  central stenosis in the vicinity of the conus.   L2-3:  Moderate central stenosis due to disc bulge and facet  arthropathy.  Disc bulge abuts but does not displace the L2 nerves  in the lateral extraforaminal spaces.   L3-4:  Prominent central stenosis observed with mild bilateral  subarticular lateral recess stenosis and mild (right greater than  left) bilateral foraminal stenosis due to disc bulge, shallow right  paracentral disc protrusion, facet arthropathy, and ligament flavum  redundancy.  Cross-sectional area of the thecal sac is 0.5 cm^2.   L4-5:  Moderate right and mild left foraminal stenosis noted with  moderate right and mild left subarticular lateral recess stenosis  and borderline central stenosis secondary to facet arthropathy,  ligament flavum redundancy, disc bulge, and small right paracentral  disc protrusion.  Intervertebral spurring is present.   L5-S1:  Moderate to prominent bilateral foraminal stenosis and mild  left subarticular lateral recess stenosis due to intervertebral  spurring, disc bulge, and facet  arthropathy.   IMPRESSION:  1.  Lumbar spondylosis and degenerative disc disease, causing  prominent impingement at L3-4; moderate to prominent impingement at  L5-S1; moderate impingement at L1-2, L2-3, and L4-5; and mild  impingement at T11-12 as detailed above.  2.  Numerous gallstones are suspected to be filling the  gallbladder.    Original Report Authenticated By: Van Clines, M.D.   She reports that she has never smoked. She has never used smokeless tobacco.  Recent Labs    09/07/20 1656  HGBA1C 5.6    Objective:  VS:  HT:    WT:   BMI:     BP:   HR: bpm  TEMP: ( )  RESP:  Physical Exam Vitals and nursing note reviewed.  HENT:     Head: Normocephalic and atraumatic.     Right Ear: External ear normal.     Left Ear: External ear normal.     Nose: Nose normal.     Mouth/Throat:     Mouth: Mucous membranes are moist.  Eyes:     Extraocular Movements: Extraocular movements intact.  Cardiovascular:     Rate and Rhythm: Normal rate.     Pulses: Normal pulses.  Pulmonary:  Effort: Pulmonary effort is normal.  Abdominal:     General: Abdomen is flat. There is no distension.  Musculoskeletal:        General: Tenderness present.     Cervical back: Normal range of motion.     Comments: Pt is slow to rise from seated position to standing. Good lumbar range of motion. Strong distal strength without clonus, no pain upon palpation of greater trochanters. Sensation intact bilaterally. Walks independently, gait steady.   Skin:    General: Skin is warm and dry.     Capillary Refill: Capillary refill takes less than 2 seconds.  Neurological:     General: No focal deficit present.     Mental Status: She is alert.  Psychiatric:        Mood and Affect: Mood normal.    Ortho Exam  Imaging: No results found.  Past Medical/Family/Surgical/Social History: Medications & Allergies reviewed per EMR, new medications updated. Patient Active Problem List   Diagnosis  Date Noted   Hyperhidrosis 09/07/2020   Vitamin D deficiency 04/18/2019   Substernal chest pain 04/18/2019   Obesity (BMI 30-39.9) 03/09/2017   Cataract 08/30/2016   Osteoarthritis of multiple joints 07/01/2014   GERD (gastroesophageal reflux disease) 05/28/2014   Pre-diabetes 12/17/2013   Chronic sinusitis 04/30/2013   Anxiety state 02/19/2013   Major depressive disorder, recurrent episode ([place]) 04/22/2012   Financial difficulties 02/19/2012   Routine adult health maintenance 11/06/2011   Vegetarian diet 08/18/2011   Diverticulosis 04/19/2011   Low back pain 06/17/2009   Hyperlipidemia 02/20/2008   HYPERTENSION, BENIGN ESSENTIAL 10/23/2007   Insomnia 03/28/2006   Past Medical History:  Diagnosis Date   Anxiety    Arthritis    Depression    GERD (gastroesophageal reflux disease)    PONV (postoperative nausea and vomiting)    Family History  Problem Relation Age of Onset   Cancer Mother 64       Lung   Parkinsonism Father    Dementia Father    Drug abuse Brother    Depression Brother    Past Surgical History:  Procedure Laterality Date   BUNIONECTOMY     bilateral   FRONTAL SINUS EXPLORATION Left 09/15/2014   Procedure: LEFT ENDOSCOPIC FRONTAL RECESS  EXPLORATION WITH FUSION IMAGE GUIDANCE NAVIGATION;  Surgeon: Leta Baptist, MD;  Location: [place];  Service: ENT;  Laterality: Left;   SEPTOPLASTY Bilateral 10/20/2013   Procedure: SEPTOPLASTY;  Surgeon: Ascencion Dike, MD;  Location: [place];  Service: ENT;  Laterality: Bilateral;   SINUS ENDO W/FUSION Bilateral 10/20/2013   Procedure: ENDOSCOPIC SINUS SURGERY WITH FUSION NAVIGATION, BILATERAL MAXILLARY ANTROSTOMIES, BILATERAL ETHMOIDECTOMIES, BILATERAL SPHENOIDECTOMIES, BILATERAL FRONTAL RECESS EXPLORATION;  Surgeon: Ascencion Dike, MD;  Location: [place];  Service: ENT;  Laterality: Bilateral;   SINUS ENDO WITH FUSION Left 09/15/2014   Procedure: LEFT ENDOSCOPIC TOTAL ETHMOIDECTOMY  WITH FUSION IMAGE GUIDANCE NAVIGATION;  Surgeon: Leta Baptist, MD;  Location: [place];  Service: ENT;  Laterality: Left;   SPHENOIDECTOMY Right 09/15/2014   Procedure: RIGHT ENDOSCOPIC SPHENOIDECTOMY WITH FUSION IMAGE GUIDANCE NAVIGATION;  Surgeon: Leta Baptist, MD;  Location: [place];  Service: ENT;  Laterality: Right;   Social History   Occupational History   Not on file  Tobacco Use   Smoking status: Never   Smokeless tobacco: Never  Substance and Sexual Activity   Alcohol use: Not Currently    Alcohol/week: 0.0 standard drinks   Drug use:  No   Sexual activity: Not on file

## 2021-01-10 ENCOUNTER — Encounter: Payer: Self-pay | Admitting: Internal Medicine

## 2021-01-10 ENCOUNTER — Telehealth: Payer: Self-pay

## 2021-01-10 MED ORDER — ACETAMINOPHEN-CODEINE #3 300-30 MG PO TABS: 1.0000 | ORAL_TABLET | Freq: Three times a day (TID) | ORAL | 0 refills | Status: DC | PRN

## 2021-01-10 NOTE — Telephone Encounter (Signed)
Requesting to speak with a nurse about getting pain medication. Please call pt back.

## 2021-01-10 NOTE — Telephone Encounter (Signed)
Acetaminophen-codeine (TYLENOL #3) 300-30 MG tablet #70 was sent to [facility] today. Returned call to patient to make her aware. No answer. Left message on VM requesting return call.

## 2021-01-18 ENCOUNTER — Telehealth: Payer: Self-pay

## 2021-01-18 NOTE — Telephone Encounter (Signed)
Return pt's call. Stated she had not received Prilosec and wanted to know if the doctor wants her to continue taking. Prilosec was refilled 12/30/20. I called [facility], talked to Wannetta Sender - she stated Prilosec will be mailed today and pt should received it in 3 days. I called pt back to inform her of the above reply from the pharmacy.

## 2021-01-18 NOTE — Telephone Encounter (Signed)
Requesting to speak with a nurse about meds. Please call back.  

## 2021-01-27 ENCOUNTER — Other Ambulatory Visit: Payer: Self-pay | Admitting: Internal Medicine

## 2021-01-29 ENCOUNTER — Ambulatory Visit
Admission: RE | Admit: 2021-01-29 | Discharge: 2021-01-29 | Disposition: A | Discharge status: Home / Self Care | Payer: Medicare Other | Source: Ambulatory Visit | Attending: Physical Medicine and Rehabilitation | Admitting: Physical Medicine and Rehabilitation

## 2021-01-29 ENCOUNTER — Other Ambulatory Visit: Payer: Self-pay

## 2021-01-29 DIAGNOSIS — M545 Low back pain, unspecified: Secondary | ICD-10-CM | POA: Diagnosis not present

## 2021-01-29 DIAGNOSIS — M2578 Osteophyte, vertebrae: Secondary | ICD-10-CM | POA: Diagnosis not present

## 2021-01-29 DIAGNOSIS — M48061 Spinal stenosis, lumbar region without neurogenic claudication: Secondary | ICD-10-CM | POA: Diagnosis not present

## 2021-01-31 ENCOUNTER — Telehealth: Payer: Self-pay | Admitting: Physical Medicine and Rehabilitation

## 2021-01-31 NOTE — Telephone Encounter (Signed)
Pt calling in regards to getting an Mri follow up sch'd with Dr. Ernestina Patches. Mri was sch'd for 01/30/20 and completed. The best call back number is [phone].

## 2021-01-31 NOTE — Telephone Encounter (Signed)
Pt states she needs this medication acetaminophen-codeine (TYLENOL #3) 300-30 MG tablet by today.States she is having pain.

## 2021-02-01 ENCOUNTER — Encounter: Payer: Self-pay | Admitting: Internal Medicine

## 2021-02-01 ENCOUNTER — Telehealth: Payer: Self-pay | Admitting: Internal Medicine

## 2021-02-01 ENCOUNTER — Other Ambulatory Visit: Payer: Self-pay | Admitting: *Deleted

## 2021-02-01 DIAGNOSIS — J00 Acute nasopharyngitis [common cold]: Secondary | ICD-10-CM

## 2021-02-01 MED ORDER — FLUTICASONE PROPIONATE 50 MCG/ACT NA SUSP: 1.0000 | Freq: Every day | NASAL | 0 refills | Status: DC

## 2021-02-01 NOTE — Telephone Encounter (Signed)
Rec' a My chart message requesting afternoon appointments with Dr Daryll Drown to f/u with her MRI results.  Dr Daryll Drown does not have any openings.  Patient is requesting a call back .

## 2021-02-01 NOTE — Telephone Encounter (Signed)
RTC to patient.  Has viewed results of MRI on E-Chart.  Has questions.   Also has a follow up appointment the doctor who did the MRI per patient.  Would like a call from Dr. Daryll Drown to talk about results if possible.

## 2021-02-01 NOTE — Telephone Encounter (Signed)
Spoke to patient who ststed she is still in a lot of pain even with the Acetaminophen-Codiene.

## 2021-02-02 ENCOUNTER — Other Ambulatory Visit: Payer: Self-pay

## 2021-02-02 ENCOUNTER — Encounter: Payer: Self-pay | Admitting: Physical Medicine and Rehabilitation

## 2021-02-02 ENCOUNTER — Ambulatory Visit (INDEPENDENT_AMBULATORY_CARE_PROVIDER_SITE_OTHER): Payer: Medicare Other | Admitting: Physical Medicine and Rehabilitation

## 2021-02-02 VITALS — BP 126/70 | HR 82

## 2021-02-02 DIAGNOSIS — M4804 Spinal stenosis, thoracic region: Secondary | ICD-10-CM | POA: Diagnosis not present

## 2021-02-02 DIAGNOSIS — M48062 Spinal stenosis, lumbar region with neurogenic claudication: Secondary | ICD-10-CM | POA: Diagnosis not present

## 2021-02-02 DIAGNOSIS — M5416 Radiculopathy, lumbar region: Secondary | ICD-10-CM

## 2021-02-02 DIAGNOSIS — M47816 Spondylosis without myelopathy or radiculopathy, lumbar region: Secondary | ICD-10-CM

## 2021-02-02 DIAGNOSIS — M5116 Intervertebral disc disorders with radiculopathy, lumbar region: Secondary | ICD-10-CM

## 2021-02-02 NOTE — Progress Notes (Signed)
[name] - 71 y.o. female MRN [id number]  Date of birth: 03-02-1950  Office Visit Note: Visit Date: 02/02/2021 PCP: Sid Falcon, MD Referred by: Sid Falcon, MD  Subjective: Chief Complaint  Patient presents with   Lower Back - Pain   Right Hip - Pain   Left Hip - Pain   Left Leg - Pain   Right Leg - Pain   HPI: [name] is a 71 y.o. female who comes in today for follow-up evaluation and management of chronic, worsening and severe bilateral lower back pain radiating to buttocks and posterior thighs, right greater than left. Patient reports pain is exacerbated by walking and prolonged standing, describes as constant soreness, currently rates as 8 out of 10. Patient reports some relief of pain with home exercise regimen, rest and use of medications. Patient is currently taking Tylenol #3 that is prescribed by her PCP Dr. Gilles Chiquito for moderate/severe pain as needed. Patients recent lumbar MRI exhibits spinal canal stenosis with cord deformity at T10-T11, compressive spinal canal stenosis at L2-L3 and L3-L4. There is also a central disc protrusion impinging on bilateral S1 nerve root. Patient was previously seen by Dr. Stefanie Libel of [facility] in 2014 whom recommended surgical consultation. Patient states she did consult with spine surgeon at that time whom did not recommend intervention. Patient has not had formal physical therapy treatments for her chronic back issues. Patient states she currently resides in a retirement community and stays active. Patient states her severe pain makes daily tasks such as laundry and running errands difficult. Patient states she did slip and fall on ice several weeks ago, no injuries from fall. Patient denies focal weakness, numbness and tingling.    Review of Systems  Musculoskeletal:  Positive for back pain.  Neurological:  Negative for tingling, sensory change, focal weakness and weakness.  All other  systems reviewed and are negative. Otherwise per HPI.  Assessment & Plan: Visit Diagnoses:    ICD-10-CM   1. Lumbar radiculopathy  M54.16 Ambulatory referral to Physical Medicine Rehab    2. Spinal stenosis of lumbar region with neurogenic claudication  M48.062 Ambulatory referral to Physical Medicine Rehab    3. Intervertebral disc disorders with radiculopathy, lumbar region  M51.16     4. Facet hypertrophy of lumbar region  M47.816     5. Thoracic spinal stenosis  M48.04        Plan: Findings:  Chronic, worsening and severe bilateral lower back pain pain radiating to buttocks and posterior thighs. Patient continues to have excruciating and debilitating pain despite good conservative therapies such as home exercise regimen, rest and use of medications. We discussed patients recent lumbar MRI with her today in detail using images and spine model. Patients clinical presentation and exam are consistent with both S1 nerve pattern and neurogenic claudication as a result of spinal canal stenosis. We believe the next step is to perform a diagnostic and therapeutic right L5-S1 interlaminar epidural steroid injection under fluoroscopic guidance. Patient is not currently on long term anticoagulation therapy. Patient does voice concern about anxiety related to injection procedure. We offered her pre-procedure sedation to take, however she does take Xanax as needed for anxiety and would like to take this medication before her procedure.  We also spoke with patient about surgical consultation as she does have spinal canal stenosis and cord deformity at T10-T11 and compressive spinal canal stenosis at L2-L3 and L3-L4.  Her exam does not show  any myelopathic findings.  We also spoke with her about obtaining thoracic MRI imaging in the future if warranted. Patient would like to proceed with diagnostic injection and will hold off on surgical consultation at this time. We will be happy to place referral for surgical  consultation when she is ready. Patient encouraged to remain active continue home exercise program as tolerated. No red flag symptoms noted upon exam today.    Meds & Orders: No orders of the defined types were placed in this encounter.   Orders Placed This Encounter  Procedures   Ambulatory referral to Physical Medicine Rehab    Follow-up: Return for Right L4-L5 interlaminar epidural steroid injection.   Procedures: No procedures performed      Clinical History: EXAM: MRI LUMBAR SPINE WITHOUT CONTRAST   TECHNIQUE: Multiplanar, multisequence MR imaging of the lumbar spine was performed. No intravenous contrast was administered.   COMPARISON:  08/28/2012   FINDINGS: Segmentation: 5 lumbar type vertebrae based on the available coverage   Alignment: Mild L3-4 anterolisthesis, chronic. Mild dextroscoliosis   Vertebrae:  No fracture, evidence of discitis, or bone lesion.   Conus medullaris and cauda equina: Conus extends to the L1-2 level. Conus and cauda equina appear normal.   Paraspinal and other soft tissues: Negative for perispinal mass or inflammation.   Disc levels:   T9-10 to T11-12 disc space narrowing and bulging with central protrusions and facet spurring. Spinal stenosis especially at T10-11 where there is cord mass effect. At least moderate foraminal narrowing on the right at T11-12.   T12- L1: Disc narrowing and bulging with small upward pointing protrusion. Negative facets   L1-L2: Disc narrowing and bulging with upward migrating herniation. Degenerative facet spurring asymmetric to the left. Mild narrowing of the thecal sac. Moderate left foraminal stenosis   L2-L3: Disc narrowing and bulging with right paracentral to foraminal herniation. Degenerative facet spurring on both sides with high-grade spinal and right foraminal stenosis   L3-L4: Disc narrowing and bulging. Facet spurring and ligamentum flavum thickening with mild anterolisthesis.  Compressive spinal stenosis. Moderate right more than left foraminal narrowing   L4-L5: Disc collapse and endplate degeneration with disc bulging and ridging. Bilateral facet spurring. Moderate bilateral foraminal stenosis   L5-S1:Disc narrowing and bulging with central protrusion and buttressing osteophytes impinging on the bilateral S1 nerve root. Bilateral facet spurring. Bilateral foraminal impingement.   IMPRESSION: 1. Diffuse lumbar spine degeneration with generalized progression from 2014. 2. Partially covered lower thoracic spine degeneration with T10-11 spinal stenosis and cord deformity. Please correlate for lower thoracic myelopathy. 3. Compressive spinal stenosis at L2-3 and L3-4. Bilateral S1 impingement at the subarticular recesses of L5-S1. 4. Multilevel foraminal stenosis especially on the right at L2-3 and below and on the left at L5-S1.     Electronically Signed   By: Jorje Guild M.D.   On: 01/29/2021 16:55   She reports that she has never smoked. She has never used smokeless tobacco.  Recent Labs    09/07/20 1656  HGBA1C 5.6    Objective:  VS:  HT:     WT:    BMI:      BP:126/70   HR:82bpm   TEMP: ( )   RESP:  Physical Exam Vitals and nursing note reviewed.  Constitutional:      Appearance: She is obese.  HENT:     Head: Normocephalic and atraumatic.     Right Ear: External ear normal.     Left Ear: External ear normal.  Nose: Nose normal.     Mouth/Throat:     Mouth: Mucous membranes are moist.  Eyes:     Extraocular Movements: Extraocular movements intact.  Cardiovascular:     Rate and Rhythm: Normal rate.     Pulses: Normal pulses.  Pulmonary:     Effort: Pulmonary effort is normal.  Abdominal:     General: Abdomen is flat. There is no distension.  Musculoskeletal:        General: Tenderness present.     Cervical back: Normal range of motion.     Comments: Pt is slow to rise from seated position to standing. Good lumbar range of  motion. Strong distal strength without clonus, no pain upon palpation of greater trochanters. Sensation intact bilaterally. Walks independently, gait steady.  Negative Babinski bilaterally.  Skin:    General: Skin is warm and dry.     Capillary Refill: Capillary refill takes less than 2 seconds.  Neurological:     General: No focal deficit present.     Mental Status: She is alert and oriented to person, place, and time.  Psychiatric:        Mood and Affect: Mood normal.        Behavior: Behavior normal.    Ortho Exam  Imaging: No results found.  Past Medical/Family/Surgical/Social History: Medications & Allergies reviewed per EMR, new medications updated. Patient Active Problem List   Diagnosis Date Noted   Hyperhidrosis 09/07/2020   Vitamin D deficiency 04/18/2019   Substernal chest pain 04/18/2019   Obesity (BMI 30-39.9) 03/09/2017   Cataract 08/30/2016   Osteoarthritis of multiple joints 07/01/2014   GERD (gastroesophageal reflux disease) 05/28/2014   Pre-diabetes 12/17/2013   Chronic sinusitis 04/30/2013   Anxiety state 02/19/2013   Major depressive disorder, recurrent episode ([place]) 04/22/2012   Financial difficulties 02/19/2012   Routine adult health maintenance 11/06/2011   Vegetarian diet 08/18/2011   Diverticulosis 04/19/2011   Low back pain 06/17/2009   Hyperlipidemia 02/20/2008   HYPERTENSION, BENIGN ESSENTIAL 10/23/2007   Insomnia 03/28/2006   Past Medical History:  Diagnosis Date   Anxiety    Arthritis    Depression    GERD (gastroesophageal reflux disease)    PONV (postoperative nausea and vomiting)    Family History  Problem Relation Age of Onset   Cancer Mother 6       Lung   Parkinsonism Father    Dementia Father    Drug abuse Brother    Depression Brother    Past Surgical History:  Procedure Laterality Date   BUNIONECTOMY     bilateral   FRONTAL SINUS EXPLORATION Left 09/15/2014   Procedure: LEFT ENDOSCOPIC FRONTAL RECESS  EXPLORATION  WITH FUSION IMAGE GUIDANCE NAVIGATION;  Surgeon: Leta Baptist, MD;  Location: [facility];  Service: ENT;  Laterality: Left;   SEPTOPLASTY Bilateral 10/20/2013   Procedure: SEPTOPLASTY;  Surgeon: Ascencion Dike, MD;  Location: [facility];  Service: ENT;  Laterality: Bilateral;   SINUS ENDO W/FUSION Bilateral 10/20/2013   Procedure: ENDOSCOPIC SINUS SURGERY WITH FUSION NAVIGATION, BILATERAL MAXILLARY ANTROSTOMIES, BILATERAL ETHMOIDECTOMIES, BILATERAL SPHENOIDECTOMIES, BILATERAL FRONTAL RECESS EXPLORATION;  Surgeon: Ascencion Dike, MD;  Location: [facility];  Service: ENT;  Laterality: Bilateral;   SINUS ENDO WITH FUSION Left 09/15/2014   Procedure: LEFT ENDOSCOPIC TOTAL ETHMOIDECTOMY WITH FUSION IMAGE GUIDANCE NAVIGATION;  Surgeon: Leta Baptist, MD;  Location: [facility];  Service: ENT;  Laterality: Left;   SPHENOIDECTOMY Right 09/15/2014   Procedure: RIGHT  ENDOSCOPIC SPHENOIDECTOMY WITH FUSION IMAGE GUIDANCE NAVIGATION;  Surgeon: Leta Baptist, MD;  Location: [facility];  Service: ENT;  Laterality: Right;   Social History   Occupational History   Not on file  Tobacco Use   Smoking status: Never   Smokeless tobacco: Never  Substance and Sexual Activity   Alcohol use: Not Currently    Alcohol/week: 0.0 standard drinks   Drug use: No   Sexual activity: Not on file

## 2021-02-02 NOTE — Progress Notes (Signed)
Pt state lower back pain that travels to both hips and legs. Pt state walking for a long period of time makes the pain worse. Pt state she feel on ice last week. Pt state she takes pain meds for her pain.  Numeric Pain Rating Scale and Functional Assessment Average Pain 10 Pain Right Now 9 My pain is constant, sharp, stabbing, and aching Pain is worse with: walking Pain improves with: medication   In the last MONTH (on 0-10 scale) has pain interfered with the following?  1. General activity like being  able to carry out your everyday physical activities such as walking, climbing stairs, carrying groceries, or moving a chair?  Rating(7)  2. Relation with others like being able to carry out your usual social activities and roles such as  activities at home, at work and in your community. Rating(8)  3. Enjoyment of life such that you have  been bothered by emotional problems such as feeling anxious, depressed or irritable?  Rating(9)

## 2021-02-03 NOTE — Telephone Encounter (Signed)
Called patient as requested.    She notes that she is nervous about plan for injection.  She wanted to know my opinion about the injection and I think that it is a good idea to help with her symptoms.  If she does not have good results, she may need surgery.  She has an appointment next week.  She did have a recent fall on the ice.  Tylenol #3 was recently refilled.   All questions were answered.   Gilles Chiquito, MD

## 2021-02-03 NOTE — Telephone Encounter (Signed)
Sent Patient mychart message and awaiting response.

## 2021-02-05 ENCOUNTER — Encounter: Payer: Self-pay | Admitting: Physical Medicine and Rehabilitation

## 2021-02-07 ENCOUNTER — Telehealth: Payer: Self-pay | Admitting: Physical Medicine and Rehabilitation

## 2021-02-07 NOTE — Telephone Encounter (Signed)
Pt is sick and need to cancel. She will call to reschedule when feeling better. Pt phone number is [phone].

## 2021-02-10 ENCOUNTER — Ambulatory Visit: Payer: Medicare Other | Admitting: Physical Medicine and Rehabilitation

## 2021-02-10 ENCOUNTER — Encounter: Payer: Self-pay | Admitting: Internal Medicine

## 2021-02-11 ENCOUNTER — Other Ambulatory Visit: Payer: Self-pay | Admitting: Internal Medicine

## 2021-02-11 ENCOUNTER — Ambulatory Visit (INDEPENDENT_AMBULATORY_CARE_PROVIDER_SITE_OTHER): Payer: Medicare Other | Admitting: Internal Medicine

## 2021-02-11 ENCOUNTER — Other Ambulatory Visit: Payer: Self-pay

## 2021-02-11 DIAGNOSIS — J069 Acute upper respiratory infection, unspecified: Secondary | ICD-10-CM | POA: Diagnosis not present

## 2021-02-11 NOTE — Progress Notes (Signed)
°  [facility] Residency Telephone Encounter Continuity Care Appointment  HPI:  This telephone encounter was created for Ms. Dakotah J Regino on 02/11/2021 for the following purpose/cc URI.   Past Medical History:  Past Medical History:  Diagnosis Date   Anxiety    Arthritis    Depression    GERD (gastroesophageal reflux disease)    PONV (postoperative nausea and vomiting)      ROS:     Assessment / Plan / Recommendations:  Please see A&P under problem oriented charting for assessment of the patient's acute and chronic medical conditions.  As always, pt is advised that if symptoms worsen or new symptoms arise, they should go to an urgent care facility or to to ER for further evaluation.   Consent and Medical Decision Making:  Patient discussed with Dr. Dareen Piano This is a telephone encounter between [facility] and Maudie Mercury on 02/11/2021 for URI symptoms. The visit was conducted with the patient located at home and Maudie Mercury at [facility]. The patient's identity was confirmed using their DOB and current address. The patient has consented to being evaluated through a telephone encounter and understands the associated risks (an examination cannot be done and the patient may need to come in for an appointment) / benefits (allows the patient to remain at home, decreasing exposure to coronavirus). I personally spent 15 minutes on medical discussion.

## 2021-02-11 NOTE — Assessment & Plan Note (Signed)
Reached out to [name] who has been experiencing a cough and sinus pain today. She states that her symptoms started on 01/31/2021 with cough and congestion. She was going to receive a back injection, but the procedure was canceled due to her symptoms. She is negative for COVID. She is not experiencing any fevers, nausea, vomiting, or myalgias.   We discussed that further evaluation would be needed to assess for bacterial cause, she is in agreement to come to the clinic for further evaluation. Based on her signs and symptoms, this is likely caused by viral etiology.  - Continue conservative management - Clinic appointment on 02/14/21 at 3:45

## 2021-02-14 ENCOUNTER — Encounter: Payer: Self-pay | Admitting: Internal Medicine

## 2021-02-14 ENCOUNTER — Other Ambulatory Visit: Payer: Self-pay

## 2021-02-14 ENCOUNTER — Ambulatory Visit (INDEPENDENT_AMBULATORY_CARE_PROVIDER_SITE_OTHER): Payer: Medicare Other | Admitting: Internal Medicine

## 2021-02-14 DIAGNOSIS — J069 Acute upper respiratory infection, unspecified: Secondary | ICD-10-CM | POA: Diagnosis not present

## 2021-02-14 NOTE — Progress Notes (Signed)
° °  CC: Follow up from Tele health appointment  HPI:  [name] is a 71 y.o. person, with a PMH noted below, who presents to the clinic follow up for URI symptoms. To see the management of their acute and chronic conditions, please see the A&P note under the Encounters tab.   Past Medical History:  Diagnosis Date   Anxiety    Arthritis    Depression    GERD (gastroesophageal reflux disease)    PONV (postoperative nausea and vomiting)    Review of Systems:   Review of Systems  Constitutional:  Negative for chills, fever, malaise/fatigue and weight loss.  HENT:  Negative for ear pain.   Eyes:  Negative for blurred vision and double vision.  Respiratory:  Positive for cough and sputum production. Negative for hemoptysis, shortness of breath and wheezing.   Cardiovascular:  Negative for chest pain and palpitations.  Gastrointestinal:  Negative for abdominal pain, nausea and vomiting.  Skin:  Negative for rash.    Physical Exam:  Vitals:   02/14/21 1539  BP: 123/73  Pulse: 87  Resp: (!) 24  Temp: 97.6 F (36.4 C)  TempSrc: Oral  SpO2: 100%  Weight: 182 lb 9.6 oz (82.8 kg)  Height: 5\' 1"  (1.549 m)   Physical Exam Constitutional:      General: She is not in acute distress.    Appearance: She is not ill-appearing, toxic-appearing or diaphoretic.  HENT:     Head: Normocephalic and atraumatic.     Comments: No tenderness to the maxillary and frontal sinuses    Right Ear: External ear normal.     Left Ear: External ear normal.     Nose: Nose normal. No congestion or rhinorrhea.     Mouth/Throat:     Mouth: Mucous membranes are moist.     Pharynx: No oropharyngeal exudate or posterior oropharyngeal erythema.  Eyes:     Conjunctiva/sclera: Conjunctivae normal.     Pupils: Pupils are equal, round, and reactive to light.  Cardiovascular:     Rate and Rhythm: Normal rate and regular rhythm.     Pulses: Normal pulses.     Heart sounds: Normal heart sounds.   Pulmonary:     Effort: Pulmonary effort is normal. No respiratory distress.     Breath sounds: Normal breath sounds. No wheezing, rhonchi or rales.  Skin:    General: Skin is warm.     Findings: No rash.  Neurological:     Mental Status: She is alert and oriented to person, place, and time.  Psychiatric:        Behavior: Behavior normal.     Assessment & Plan:   See Encounters Tab for problem based charting.  Patient discussed with Dr. Dareen Piano

## 2021-02-14 NOTE — Telephone Encounter (Signed)
She has an appt today to see Dr Gilford Rile.

## 2021-02-15 ENCOUNTER — Encounter: Payer: Self-pay | Admitting: Internal Medicine

## 2021-02-15 NOTE — Assessment & Plan Note (Signed)
[name]" presents today for follow up on her cough on 01/31/21. She states that her cough is much improved today, she has been utilizing cough drops to great effect. She denies any fevers, nausea, vomiting, or myalgias since our last discussion. Physical examination is reassuring for likely viral cause. No rhinorrhea or cobblestoning present on examination this is likely post infectious cough with minimal coughing during the office encounter.  - Continue conservative management

## 2021-02-23 ENCOUNTER — Encounter: Payer: Self-pay | Admitting: Internal Medicine

## 2021-02-23 DIAGNOSIS — Z780 Asymptomatic menopausal state: Secondary | ICD-10-CM

## 2021-02-23 MED ORDER — ACETAMINOPHEN-CODEINE #3 300-30 MG PO TABS: ORAL_TABLET | ORAL | 0 refills | Status: DC

## 2021-02-23 NOTE — Telephone Encounter (Signed)
Pt has sent a My chart message and is now calling asking for refills.  Pt states she has gotten no better and can barely move and needs something called in .     Please call the pt back.

## 2021-02-23 NOTE — Progress Notes (Signed)
Internal Medicine Clinic Attending ? ?Case discussed with Dr. Winters  At the time of the visit.  We reviewed the resident?s history and exam and pertinent patient test results.  I agree with the assessment, diagnosis, and plan of care documented in the resident?s note.  ?

## 2021-02-24 NOTE — Addendum Note (Signed)
Addended by: Gilles Chiquito B on: 02/24/2021 03:51 PM   Modules accepted: Orders

## 2021-02-24 NOTE — Telephone Encounter (Signed)
I have placed an order for MMG and DEXA as per patient request.   Reviewed vaccination status and gave the recommendations to Ms. Davenport.   Gilles Chiquito, MD

## 2021-02-27 ENCOUNTER — Other Ambulatory Visit: Payer: Self-pay | Admitting: Internal Medicine

## 2021-02-27 DIAGNOSIS — F330 Major depressive disorder, recurrent, mild: Secondary | ICD-10-CM

## 2021-02-28 ENCOUNTER — Encounter: Payer: Self-pay | Admitting: Internal Medicine

## 2021-02-28 DIAGNOSIS — F330 Major depressive disorder, recurrent, mild: Secondary | ICD-10-CM

## 2021-02-28 MED ORDER — ALPRAZOLAM 0.5 MG PO TABS: ORAL_TABLET | ORAL | 2 refills | Status: DC

## 2021-02-28 NOTE — Progress Notes (Signed)
Internal Medicine Clinic Attending ? ?Case discussed with Dr. Winters  At the time of the visit.  We reviewed the resident?s history and exam and pertinent patient test results.  I agree with the assessment, diagnosis, and plan of care documented in the resident?s note.  ?

## 2021-03-09 ENCOUNTER — Other Ambulatory Visit: Payer: Self-pay | Admitting: Internal Medicine

## 2021-03-09 ENCOUNTER — Encounter: Payer: Self-pay | Admitting: Internal Medicine

## 2021-03-09 NOTE — Telephone Encounter (Signed)
Patient called in upset, sounding slurred. States she was only able to get 50 tabs of T#3 on 2/1 instead of the full 70 as that was all the pharmacy had. She is wanting to know what to do for the back pain. States she is unable to get out of bed or walk her dog. States "I hurt." Reports taking 6-8 ibuprofen q 2 hours in addition to xanax and still cannot get out of bed. Explained how taking this many ibuprofen can adversely affect her kidney and she states, "I don't care, I will just go to bed and die."  Will confirm with [facility] that patient only received 50 tabs of T#3. Usually, insurance will not pay for the remaining tabs. She is advised if this happens again, to contact other pharmacies who may have the entire qty.

## 2021-03-09 NOTE — Telephone Encounter (Signed)
Not sure if you want me to assist? Just let me know-glad to give her a call & reinforce your good teaching here Lauren.

## 2021-03-10 ENCOUNTER — Telehealth: Payer: Self-pay | Admitting: Physical Medicine and Rehabilitation

## 2021-03-10 NOTE — Telephone Encounter (Signed)
Patient called. She would like an appointment with Dr. Ernestina Patches. Her call back number is [phone]

## 2021-03-22 ENCOUNTER — Other Ambulatory Visit: Payer: Self-pay

## 2021-03-22 ENCOUNTER — Other Ambulatory Visit: Payer: Self-pay | Admitting: Internal Medicine

## 2021-03-22 DIAGNOSIS — J00 Acute nasopharyngitis [common cold]: Secondary | ICD-10-CM

## 2021-03-22 NOTE — Telephone Encounter (Signed)
Flonase was previously requested .

## 2021-03-22 NOTE — Telephone Encounter (Signed)
fluticasone (FLONASE) 50 MCG/ACT nasal spray (Expired)  diclofenac Sodium (VOLTAREN) 1 % GEL, refill request @ [place], [place].

## 2021-03-23 ENCOUNTER — Other Ambulatory Visit: Payer: Self-pay | Admitting: Internal Medicine

## 2021-03-23 MED ORDER — DICLOFENAC SODIUM 1 % EX GEL: CUTANEOUS | 0 refills | Status: DC

## 2021-03-29 ENCOUNTER — Ambulatory Visit
Admission: RE | Admit: 2021-03-29 | Discharge: 2021-03-29 | Disposition: A | Discharge status: Home / Self Care | Payer: Medicare Other | Source: Ambulatory Visit | Attending: Internal Medicine | Admitting: Internal Medicine

## 2021-03-29 ENCOUNTER — Encounter: Payer: Self-pay | Admitting: Internal Medicine

## 2021-03-29 DIAGNOSIS — M8589 Other specified disorders of bone density and structure, multiple sites: Secondary | ICD-10-CM | POA: Diagnosis not present

## 2021-03-29 DIAGNOSIS — Z780 Asymptomatic menopausal state: Secondary | ICD-10-CM

## 2021-03-29 DIAGNOSIS — Z1231 Encounter for screening mammogram for malignant neoplasm of breast: Secondary | ICD-10-CM | POA: Diagnosis not present

## 2021-03-30 ENCOUNTER — Telehealth: Payer: Self-pay

## 2021-03-30 NOTE — Telephone Encounter (Signed)
Requesting to speak with Dr. Daryll Drown about bone density. Please call pt back.  ?

## 2021-04-04 ENCOUNTER — Encounter: Payer: Self-pay | Admitting: Internal Medicine

## 2021-04-04 DIAGNOSIS — M545 Low back pain, unspecified: Secondary | ICD-10-CM

## 2021-04-07 DIAGNOSIS — H52223 Regular astigmatism, bilateral: Secondary | ICD-10-CM | POA: Diagnosis not present

## 2021-04-07 DIAGNOSIS — H26492 Other secondary cataract, left eye: Secondary | ICD-10-CM | POA: Diagnosis not present

## 2021-04-07 DIAGNOSIS — H26491 Other secondary cataract, right eye: Secondary | ICD-10-CM | POA: Diagnosis not present

## 2021-04-07 DIAGNOSIS — H524 Presbyopia: Secondary | ICD-10-CM | POA: Diagnosis not present

## 2021-04-07 DIAGNOSIS — H5213 Myopia, bilateral: Secondary | ICD-10-CM | POA: Diagnosis not present

## 2021-04-22 DIAGNOSIS — H26492 Other secondary cataract, left eye: Secondary | ICD-10-CM | POA: Diagnosis not present

## 2021-04-22 DIAGNOSIS — H26493 Other secondary cataract, bilateral: Secondary | ICD-10-CM | POA: Diagnosis not present

## 2021-04-25 ENCOUNTER — Other Ambulatory Visit: Payer: Self-pay | Admitting: Internal Medicine

## 2021-04-25 MED ORDER — ACETAMINOPHEN-CODEINE #3 300-30 MG PO TABS: 1.0000 | ORAL_TABLET | Freq: Three times a day (TID) | ORAL | 0 refills | Status: DC | PRN

## 2021-04-25 NOTE — Telephone Encounter (Signed)
Approved tylenol with codeine refill.  ?

## 2021-05-04 DIAGNOSIS — S8001XA Contusion of right knee, initial encounter: Secondary | ICD-10-CM | POA: Diagnosis not present

## 2021-05-04 DIAGNOSIS — S335XXA Sprain of ligaments of lumbar spine, initial encounter: Secondary | ICD-10-CM | POA: Diagnosis not present

## 2021-05-04 DIAGNOSIS — S7001XA Contusion of right hip, initial encounter: Secondary | ICD-10-CM | POA: Diagnosis not present

## 2021-05-04 MED ORDER — ACETAMINOPHEN-CODEINE #3 300-30 MG PO TABS: 1.0000 | ORAL_TABLET | Freq: Three times a day (TID) | ORAL | 0 refills | Status: DC | PRN

## 2021-05-04 NOTE — Addendum Note (Signed)
Addended by: Jodean Lima on: 05/04/2021 02:15 PM ? ? Modules accepted: Orders ? ?

## 2021-05-30 ENCOUNTER — Telehealth: Payer: Self-pay

## 2021-05-30 DIAGNOSIS — G8929 Other chronic pain: Secondary | ICD-10-CM

## 2021-05-30 NOTE — Telephone Encounter (Signed)
Pt is requesting a call back .. she is wanting more pain medicine  ?

## 2021-05-31 ENCOUNTER — Telehealth: Payer: Self-pay | Admitting: Physical Medicine and Rehabilitation

## 2021-05-31 NOTE — Telephone Encounter (Signed)
Patient called needing to reschedule her appointment with Dr. Ernestina Patches for an injection in her back.    Ph# [phone] ?

## 2021-05-31 NOTE — Telephone Encounter (Signed)
Called pt - stated she needs a refill on Tylenol #3. And she supposes to get a back injection at Dr [employer] office. Stated she's having more pain; has been using voltaren and motrin. And she wants to wait until after the back injection before scheduling an appt here.I told I will inform Dr Daryll Drown of what's going on with her. ?

## 2021-06-01 NOTE — Telephone Encounter (Signed)
Tried calling patient to get appointment scheduled.  Went straight to [facility]. I sent patient mychart message.  ?

## 2021-06-02 MED ORDER — ACETAMINOPHEN-CODEINE #3 300-30 MG PO TABS: 1.0000 | ORAL_TABLET | Freq: Three times a day (TID) | ORAL | 0 refills | Status: DC | PRN

## 2021-06-08 ENCOUNTER — Other Ambulatory Visit: Payer: Self-pay | Admitting: Internal Medicine

## 2021-06-08 DIAGNOSIS — J00 Acute nasopharyngitis [common cold]: Secondary | ICD-10-CM

## 2021-06-21 ENCOUNTER — Other Ambulatory Visit: Payer: Self-pay | Admitting: Internal Medicine

## 2021-06-21 DIAGNOSIS — F330 Major depressive disorder, recurrent, mild: Secondary | ICD-10-CM

## 2021-07-01 ENCOUNTER — Other Ambulatory Visit: Payer: Self-pay | Admitting: Student

## 2021-07-01 ENCOUNTER — Telehealth: Payer: Self-pay | Admitting: Student

## 2021-07-01 MED ORDER — ACETAMINOPHEN-CODEINE 300-30 MG PO TABS: 1.0000 | ORAL_TABLET | Freq: Three times a day (TID) | ORAL | 0 refills | Status: DC | PRN

## 2021-07-01 NOTE — Telephone Encounter (Signed)
[name] called in the after hours [facility] clinic number. She has a long standing history of chronic pain managed by her PCP at [facility]. She mentioned having good days and bad days in regards to her chronic pain. As a result of this she is requesting a refill on her tylenol #3 two days early. Refill sent in to [facility] on [address]. Discussed following up with her PCP and upcoming appointments in regards to her chronic pain.

## 2021-07-02 ENCOUNTER — Telehealth: Payer: Self-pay | Admitting: Internal Medicine

## 2021-07-02 NOTE — Telephone Encounter (Signed)
Pt called the [facility] after hours urgent  care line this morning. Attempted to return call to pt x2 without success. No VM left.

## 2021-07-04 ENCOUNTER — Ambulatory Visit (INDEPENDENT_AMBULATORY_CARE_PROVIDER_SITE_OTHER): Payer: Medicare Other | Admitting: Physical Medicine and Rehabilitation

## 2021-07-04 ENCOUNTER — Ambulatory Visit: Payer: Self-pay

## 2021-07-04 ENCOUNTER — Encounter: Payer: Self-pay | Admitting: Physical Medicine and Rehabilitation

## 2021-07-04 VITALS — BP 123/85 | HR 77

## 2021-07-04 DIAGNOSIS — M5416 Radiculopathy, lumbar region: Secondary | ICD-10-CM

## 2021-07-04 MED ORDER — ACETAMINOPHEN-CODEINE 300-30 MG PO TABS: 1.0000 | ORAL_TABLET | Freq: Three times a day (TID) | ORAL | 0 refills | Status: DC | PRN

## 2021-07-04 MED ORDER — METHYLPREDNISOLONE ACETATE 80 MG/ML IJ SUSP
80.0000 mg | Freq: Once | INTRAMUSCULAR | Status: AC
  Administered 2021-07-04: 80 mg

## 2021-07-04 NOTE — Telephone Encounter (Signed)
Patient notified Rx for T#3 has been resent. She is very [employer] attorney.

## 2021-07-04 NOTE — Telephone Encounter (Signed)
Refilled

## 2021-07-04 NOTE — Addendum Note (Signed)
Addended by: Velora Heckler on: 07/04/2021 10:09 AM   Modules accepted: Orders

## 2021-07-04 NOTE — Patient Instructions (Signed)

## 2021-07-04 NOTE — Telephone Encounter (Signed)
Patient called in stating [facility] did not recognize Provider's DEA so will not fill Rx for T#3. Spoke with Edie at [facility]. States their system will not allow them to add specific numbers for Residents. Requesting this Rx be resent by Attending.

## 2021-07-04 NOTE — Addendum Note (Signed)
Addended by: Gilles Chiquito B on: 07/04/2021 03:33 PM   Modules accepted: Orders

## 2021-07-04 NOTE — Progress Notes (Unsigned)
Pt state lower back pain that travels to right legs. Pt state walking for a long period of time makes the pain worse. Pt state she uses pain cream and pain meds to help ease her pain.  Numeric Pain Rating Scale and Functional Assessment Average Pain 5   In the last MONTH (on 0-10 scale) has pain interfered with the following?  1. General activity like being  able to carry out your everyday physical activities such as walking, climbing stairs, carrying groceries, or moving a chair?  Rating(10)   +Driver, -BT, -Dye Allergies.

## 2021-07-23 ENCOUNTER — Other Ambulatory Visit: Payer: Self-pay | Admitting: Internal Medicine

## 2021-07-23 DIAGNOSIS — F330 Major depressive disorder, recurrent, mild: Secondary | ICD-10-CM

## 2021-07-27 ENCOUNTER — Other Ambulatory Visit: Payer: Self-pay

## 2021-07-27 MED ORDER — ACETAMINOPHEN-CODEINE 300-30 MG PO TABS: 1.0000 | ORAL_TABLET | Freq: Three times a day (TID) | ORAL | 0 refills | Status: DC | PRN

## 2021-07-27 NOTE — Telephone Encounter (Signed)
acetaminophen-codeine (TYLENOL #3) 300-30 MG tablet, refill request @ [facility] [place].

## 2021-08-01 ENCOUNTER — Other Ambulatory Visit: Payer: Self-pay | Admitting: Internal Medicine

## 2021-08-01 ENCOUNTER — Encounter: Payer: Medicare Other | Admitting: Student

## 2021-08-01 DIAGNOSIS — F330 Major depressive disorder, recurrent, mild: Secondary | ICD-10-CM

## 2021-08-05 ENCOUNTER — Ambulatory Visit (INDEPENDENT_AMBULATORY_CARE_PROVIDER_SITE_OTHER): Payer: Medicare Other

## 2021-08-05 DIAGNOSIS — Z0000 Encounter for general adult medical examination without abnormal findings: Secondary | ICD-10-CM

## 2021-08-05 NOTE — Progress Notes (Signed)
Subjective:   [name] is a 71 y.o. female who presents for Medicare Annual (Subsequent) preventive examination.  I connected with  [name] on 10/05/21 by a audio enabled telemedicine application and verified that I am speaking with the correct person using two identifiers.  Patient Location: Home  Provider Location: Office/Clinic  I discussed the limitations of evaluation and management by telemedicine. The patient expressed understanding and agreed to proceed.   Review of Systems    DEFERRED  TO PCP  Cardiac Risk Factors include: advanced age (>31mn, >>28women);hypertension     Objective:    Today's Vitals   08/05/21 1206  PainSc: 8    There is no height or weight on file to calculate BMI.     08/05/2021   12:12 PM 02/14/2021    3:47 PM 12/09/2020    2:34 PM 11/05/2020    9:51 AM 09/07/2020    3:25 PM 05/27/2020    3:42 PM 03/05/2020   10:57 AM  Advanced Directives  Does Patient Have a Medical Advance Directive? Yes Yes Yes Yes Yes Yes No  Type of Advance Directive Out of facility DNR (pink MOST or yellow form) [place] will;Out of facility DNR (pink MOST or yellow form) [place] will [place] will   Does patient want to make changes to medical advance directive?    No - Patient declined     Copy of [place] Chart?  Yes - validated most recent copy scanned in chart (See row information) No - copy requested Yes - validated most recent copy scanned in chart (See row information) Yes - validated most recent copy scanned in chart (See row information) Yes - validated most recent copy scanned in chart (See row information)   Would patient like information on creating a medical advance directive?       No - Patient declined    Current Medications (verified) Outpatient  Encounter Medications as of 08/05/2021  Medication Sig   amLODipine (NORVASC) 5 MG tablet Take 1 tablet (5 mg total) by mouth daily.   buPROPion (WELLBUTRIN) 75 MG tablet TAKE 2 TABLETS BY MOUTH  TWICE DAILY   diclofenac Sodium (VOLTAREN) 1 % GEL APPLY 4 GRAMS TOPICALLY 4 TIMES DAILY  Strength: 1 %   escitalopram (LEXAPRO) 20 MG tablet TAKE 1 TABLET BY MOUTH  DAILY   fluticasone (FLONASE) 50 MCG/ACT nasal spray USE 1 SPRAY(S) IN EACH NOSTRIL ONCE DAILY FOR 14 DAYS   omeprazole (PRILOSEC) 20 MG capsule TAKE 1 CAPSULE BY MOUTH  DAILY   [DISCONTINUED] acetaminophen-codeine (TYLENOL #3) 300-30 MG tablet Take 1-2 tablets by mouth every 8 (eight) hours as needed for moderate pain.   [DISCONTINUED] ALPRAZolam (XANAX) 0.5 MG tablet TAKE 1 TO 2 TABLETS BY MOUTH AS NEEDED FOR ANXIETY . DO NOT EXCEED 4 PER 24 HOURS   [DISCONTINUED] exenatide (BYETTA 5 MCG PEN) 5 MCG/0.02ML SOLN Inject 0.02 mLs (5 mcg total) into the skin 2 (two) times daily with a meal.   [DISCONTINUED] famotidine (PEPCID) 40 MG tablet Take 1 tablet (40 mg total) by mouth daily.   No facility-administered encounter medications on file as of 08/05/2021.    Allergies (verified) Codeine, Penicillins, Cephalexin, and Lamictal [lamotrigine]   History: Past Medical History:  Diagnosis Date   Anxiety    Arthritis    Depression    GERD (gastroesophageal reflux disease)  PONV (postoperative nausea and vomiting)    Past Surgical History:  Procedure Laterality Date   BUNIONECTOMY     bilateral   FRONTAL SINUS EXPLORATION Left 09/15/2014   Procedure: LEFT ENDOSCOPIC FRONTAL RECESS  EXPLORATION WITH FUSION IMAGE GUIDANCE NAVIGATION;  Surgeon: Leta Baptist, MD;  Location: [facility];  Service: ENT;  Laterality: Left;   SEPTOPLASTY Bilateral 10/20/2013   Procedure: SEPTOPLASTY;  Surgeon: Ascencion Dike, MD;  Location: [facility];  Service: ENT;  Laterality: Bilateral;   SINUS ENDO W/FUSION Bilateral 10/20/2013    Procedure: ENDOSCOPIC SINUS SURGERY WITH FUSION NAVIGATION, BILATERAL MAXILLARY ANTROSTOMIES, BILATERAL ETHMOIDECTOMIES, BILATERAL SPHENOIDECTOMIES, BILATERAL FRONTAL RECESS EXPLORATION;  Surgeon: Ascencion Dike, MD;  Location: [facility];  Service: ENT;  Laterality: Bilateral;   SINUS ENDO WITH FUSION Left 09/15/2014   Procedure: LEFT ENDOSCOPIC TOTAL ETHMOIDECTOMY WITH FUSION IMAGE GUIDANCE NAVIGATION;  Surgeon: Leta Baptist, MD;  Location: [place] [facility];  Service: ENT;  Laterality: Left;   SPHENOIDECTOMY Right 09/15/2014   Procedure: RIGHT ENDOSCOPIC SPHENOIDECTOMY WITH FUSION IMAGE GUIDANCE NAVIGATION;  Surgeon: Leta Baptist, MD;  Location: [place];  Service: ENT;  Laterality: Right;   Family History  Problem Relation Age of Onset   Cancer Mother 51       Lung   Parkinsonism Father    Dementia Father    Drug abuse Brother    Depression Brother    Social History   Socioeconomic History   Marital status: Single    Spouse name: Not on file   Number of children: Not on file   Years of education: Not on file   Highest education level: Not on file  Occupational History   Not on file  Tobacco Use   Smoking status: Never   Smokeless tobacco: Never  Substance and Sexual Activity   Alcohol use: Not Currently    Alcohol/week: 0.0 standard drinks of alcohol   Drug use: No   Sexual activity: Not on file  Other Topics Concern   Not on file  Social History Narrative   Current Social History 05/27/2020        Patient lives alone in an apartment which is 1 story. There are not steps up to the entrance.       Patient's method of transportation is personal car.      The highest level of education was some college.      The patient currently retired.      Identified important Relationships are "My dog"       Pets : one dog       Interests / Fun: "being with my dog, I don't like people"       Current Stressors: poor family relationships       Religious  / Personal Beliefs: "I believe in God"      Social Determinants of Health   Financial Resource Strain: Low Risk  (08/05/2021)   Overall Financial Resource Strain (CARDIA)    Difficulty of Paying Living Expenses: Not hard at all  Food Insecurity: No Food Insecurity (08/05/2021)   Hunger Vital Sign    Worried About Running Out of Food in the Last Year: Never true    H[place] in the Last Year: Never true  Transportation Needs: No Transportation Needs (08/05/2021)   PRAPARE - Hydrologist (Medical): No    Lack of Transportation (Non-Medical): No  Physical Activity: Inactive (08/05/2021)  Exercise Vital Sign    Days of Exercise per Week: 0 days    Minutes of Exercise per Session: 0 min  Stress: Stress Concern Present (08/05/2021)   [place]    Feeling of Stress : To some extent  Social Connections: Socially Isolated (08/05/2021)   Social Connection and Isolation Panel [NHANES]    Frequency of Communication with Friends and Family: Three times a week    Frequency of Social Gatherings with Friends and Family: Never    Attends Religious Services: Never    Marine scientist or Organizations: No    Attends Music therapist: Never    Marital Status: Divorced    Tobacco Counseling Counseling given: Not Answered   Clinical Intake:  Pre-visit preparation completed: Yes  Pain : 0-10 Pain Score: 8  Pain Type: Chronic pain Pain Location: Back Pain Orientation: Mid Pain Descriptors / Indicators: Constant Pain Onset: Other (comment)     Diabetes: No  How often do you need to have someone help you when you read instructions, pamphlets, or other written materials from your doctor or pharmacy?: 1 - Never What is the last grade level you completed in school?: some college  Diabetic?NO   Interpreter Needed?: No  Information entered by :: Yuleidy Rappleye   Activities of Daily Living    08/05/2021   12:13 PM 02/14/2021    3:38 PM  In your present state of health, do you have any difficulty performing the following activities:  Hearing? 1 0  Vision? 0 0  Comment  needs eye exam.  Had Cataract surgery  Difficulty concentrating or making decisions? 0 0  Walking or climbing stairs? 1 1  Comment  legs and back pain. left shoulder  Dressing or bathing? 0 0  Doing errands, shopping? 0 0  Preparing Food and eating ? N   Using the Toilet? N   In the past six months, have you accidently leaked urine? N   Do you have problems with loss of bowel control? N   Managing your Medications? N   Managing your Finances? N   Housekeeping or managing your Housekeeping? N     Patient Care Team: Sid Falcon, MD as PCP - General (Internal Medicine) Katy Fitch, Darlina Guys, MD as Consulting Physician (Ophthalmology) Drema Pry as Social Worker [name], RN as [place] any recent [place] you may have received from other than [facility] providers in the past year (date may be approximate).     Assessment:   This is a routine wellness examination for Kerstin.  Hearing/Vision screen No results found.  Dietary issues and exercise activities discussed: Current Exercise Habits: The patient does not participate in regular exercise at present, Exercise limited by: None identified   Goals Addressed   None   Depression Screen    08/05/2021   12:08 PM 02/14/2021    4:40 PM 12/09/2020    4:25 PM 11/05/2020   10:32 AM 09/07/2020    4:39 PM 05/27/2020    3:45 PM 03/05/2020   11:04 AM  PHQ 2/9 Scores  PHQ - 2 Score 0 '5 5 5 4 3 5  '$ PHQ- 9 Score  '23 19 25 18 18 16    '$ Fall Risk    08/05/2021   12:12 PM 02/14/2021    3:37 PM 12/09/2020    2:32 PM 11/05/2020    9:38 AM 09/07/2020  3:25 PM  Fall Risk   Falls in the past year? 1 0 0 1 1  Number falls in past yr: 0 0 0 1 1  Injury with  Fall? 1 0 '1 1 1  '$ Risk for fall due to : Other (Comment) No Fall Risks History of fall(s) History of fall(s) History of fall(s)  Follow up Falls evaluation completed;Falls prevention discussed Falls evaluation completed;Falls prevention discussed Falls evaluation completed;Falls prevention discussed Falls evaluation completed;Falls prevention discussed Falls evaluation completed    FALL RISK PREVENTION PERTAINING TO THE HOME:  Any stairs in or around the home? No  If so, are there any without handrails? No  Home free of loose throw rugs in walkways, pet beds, electrical cords, etc? No  Adequate lighting in your home to reduce risk of falls? Yes   ASSISTIVE DEVICES UTILIZED TO PREVENT FALLS:  Life alert? No  Use of a cane, walker or w/c? No  Grab bars in the bathroom? Yes  Shower chair or bench in shower? No  Elevated toilet seat or a handicapped toilet? No   TIMED UP AND GO:  Was the test performed? No .  Length of time to ambulate 10 feet: N/A  sec.     Cognitive Function:        08/05/2021   12:16 PM  6CIT Screen  What Year? 0 points  What month? 0 points  What time? 0 points  Count back from 20 0 points  Months in reverse 0 points  Repeat phrase 0 points  Total Score 0 points    Immunizations Immunization History  Administered Date(s) Administered   Influenza Split 11/06/2011, 09/22/2018   Influenza Whole 10/23/2007, 11/03/2008, 10/05/2009   Influenza, High Dose Seasonal PF 09/20/2021   Influenza,inj,Quad PF,6+ Mos 10/15/2012, 09/24/2013, 11/10/2014   Influenza-Unspecified 08/23/2017   Moderna Sars-Covid-2 Vaccination 05/27/2019, 06/21/2019   Pneumococcal Polysaccharide-23 12/17/2013   Tdap 10/02/2017    TDAP status: Up to date  Flu Vaccine status: Up to date  Pneumococcal vaccine status: Due, Education has been provided regarding the importance of this vaccine. Advised may receive this vaccine at local pharmacy or Health Dept. Aware to provide a copy of  the vaccination record if obtained from local pharmacy or Health Dept. Verbalized acceptance and understanding.  Covid-19 vaccine status: Completed vaccines  Qualifies for Shingles Vaccine? Yes   Zostavax completed No   Shingrix Completed?: No.    Education has been provided regarding the importance of this vaccine. Patient has been advised to call insurance company to determine out of pocket expense if they have not yet received this vaccine. Advised may also receive vaccine at local pharmacy or Health Dept. Verbalized acceptance and understanding.  Screening Tests Health Maintenance  Topic Date Due   Zoster Vaccines- Shingrix (1 of 2) Never done   Pneumonia Vaccine 53+ Years old (2 - PCV) 12/18/2014   COLONOSCOPY (Pts 45-24yr Insurance coverage will need to be confirmed)  04/10/2018   COVID-19 Vaccine (3 - Moderna series) 08/16/2019   MAMMOGRAM  03/30/2023   TETANUS/TDAP  10/03/2027   INFLUENZA VACCINE  Completed   DEXA SCAN  Completed   Hepatitis C Screening  Completed   HPV VACCINES  Aged Out    Health Maintenance  Health Maintenance Due  Topic Date Due   Zoster Vaccines- Shingrix (1 of 2) Never done   Pneumonia Vaccine 73 Years old (2 - PCV) 12/18/2014   COLONOSCOPY (Pts 45-477yrInsurance coverage will need to be confirmed)  04/10/2018  COVID-19 Vaccine (3 - Moderna series) 08/16/2019    Colorectal cancer screening: Type of screening: Colonoscopy. Completed 05/24/2006. Repeat every 10 years   MAMMOGRAM : completed 03/29/2021 repeat  every 2 years   Bone Density status: Completed 03/23/2021. Results reflect: Bone density results: NORMAL. Repeat every once  years.  Lung Cancer Screening: (Low Dose CT Chest recommended if Age 12-80 years, 30 pack-year currently smoking OR have quit w/in 15years.) does not qualify.   Lung Cancer Screening Referral: DEFERRED TO PCP   Additional Screening:  Hepatitis C Screening: does qualify; Completed 04/18/2019  Vision Screening:  Recommended annual ophthalmology exams for early detection of glaucoma and other disorders of the eye. Is the patient up to date with their annual eye exam?  Yes  Who is the provider or what is the name of the office in which the patient attends annual eye exams?  PT DOES NOT RECALL THE DR NAME  If pt is not established with a provider, would they like to be referred to a provider to establish care? No .   Dental Screening: Recommended annual dental exams for proper oral hygiene  Community Resource Referral / Chronic Care Management: CRR required this visit?  No   CCM required this visit?  No      Plan:     I have personally reviewed and noted the following in the patient's chart:   Medical and social history Use of alcohol, tobacco or illicit drugs  Current medications and supplements including opioid prescriptions.  Functional ability and status Nutritional status Physical activity Advanced directives List of other physicians Hospitalizations, surgeries, and ER visits in previous 12 months Vitals Screenings to include cognitive, depression, and falls Referrals and appointments  In addition, I have reviewed and discussed with patient certain preventive protocols, quality metrics, and best practice recommendations. A written personalized care plan for preventive services as well as general preventive health recommendations were provided to patient.     Judyann Munson, CMA   10/05/2021   Nurse Notes:  NON FACE TO FACE   Ms. Hipolito , Thank you for taking time to come for your Medicare Wellness Visit. I appreciate your ongoing commitment to your health goals. Please review the following plan we discussed and let me know if I can assist you in the future.   These are the goals we discussed:  Goals      Cope with Chronic Pain     Timeframe:  Long-Range Goal Priority:  High Start Date: 07/14/2020                         Expected End Date:   10/14/2020                     Follow Up Date 08/11/2020    - learn relaxation techniques - practice acceptance of chronic pain - practice relaxation or meditation daily - tell myself I can (not I can't) - use distraction techniques    Why is this important?   Stress makes chronic pain feel worse.  Feelings like depression, anxiety, stress and anger can make your body more sensitive to pain.  Learning ways to cope with stress or depression may help you find some relief from the pain.     Notes:      Manage Chronic Pain     Timeframe:  Long-Range Goal Priority:  High Start Date:07/14/2020  Expected End Date:      10/14/2020                 Follow Up Date 08/11/2020    - keep track of prescription refills - plan exercise or activity when pain is best controlled - start a pain diary - track times pain is worst and when it is best    Why is this important?   Day-to-day life can be hard when you have chronic pain.  Pain medicine is just one piece of the treatment puzzle.  You can try these action steps to help you manage your pain.    Notes:      Prevent Falls     Track and Manage My Blood Pressure-Hypertension     Timeframe:  Long-Range Goal Priority:  High Start Date:                             Expected End Date:                       Follow Up Date 08/11/2020    - check blood pressure weekly - write blood pressure results in a log or diary    Why is this important?   You won't feel high blood pressure, but it can still hurt your blood vessels.  High blood pressure can cause heart or kidney problems. It can also cause a stroke.  Making lifestyle changes like losing a little weight or eating less salt will help.  Checking your blood pressure at home and at different times of the day can help to control blood pressure.  If the doctor prescribes medicine remember to take it the way the doctor ordered.  Call the office if you cannot afford the medicine or if there are  questions about it.     Notes:         This is a list of the screening recommended for you and due dates:  Health Maintenance  Topic Date Due   Zoster (Shingles) Vaccine (1 of 2) Never done   Pneumonia Vaccine (2 - PCV) 12/18/2014   Colon Cancer Screening  04/10/2018   COVID-19 Vaccine (3 - Moderna series) 08/16/2019   Mammogram  03/30/2023   Tetanus Vaccine  10/03/2027   Flu Shot  Completed   DEXA scan (bone density measurement)  Completed   Hepatitis C Screening: USPSTF Recommendation to screen - Ages 18-79 yo.  Completed   HPV Vaccine  Aged Out

## 2021-08-05 NOTE — Patient Instructions (Signed)

## 2021-08-10 NOTE — Progress Notes (Signed)
Internal Medicine Clinic Attending  Internal Medicine Clinic Attending  Case and documentation reviewed.  I reviewed the AWV findings.  I agree with the assessment, diagnosis, and plan of care documented in the AWV note.

## 2021-08-12 ENCOUNTER — Telehealth: Payer: Self-pay

## 2021-08-12 ENCOUNTER — Ambulatory Visit (INDEPENDENT_AMBULATORY_CARE_PROVIDER_SITE_OTHER): Payer: Medicare Other | Admitting: Student

## 2021-08-12 DIAGNOSIS — M5442 Lumbago with sciatica, left side: Secondary | ICD-10-CM | POA: Diagnosis not present

## 2021-08-12 DIAGNOSIS — G8929 Other chronic pain: Secondary | ICD-10-CM

## 2021-08-12 DIAGNOSIS — M5441 Lumbago with sciatica, right side: Secondary | ICD-10-CM | POA: Diagnosis not present

## 2021-08-12 MED ORDER — ACETAMINOPHEN-CODEINE 300-30 MG PO TABS: 1.0000 | ORAL_TABLET | Freq: Three times a day (TID) | ORAL | 0 refills | Status: DC | PRN

## 2021-08-12 NOTE — Progress Notes (Signed)
   CC: chronic low back pain  This is a telephone encounter between [name] and [name] on 08/12/2021 for chronic low back pain managment. The visit was conducted with the patient located at home and Sanjuana Letters at [facility]. The patient's identity was confirmed using their DOB and current address. The patient has consented to being evaluated through a telephone encounter and understands the associated risks (an examination cannot be done and the patient may need to come in for an appointment) / benefits (allows the patient to remain at home, decreasing exposure to coronavirus). I personally spent 12 minutes on medical discussion.   HPI:  [name] is a 71 y.o. with PMH as below.   Please see A&P for assessment of the patient's acute and chronic medical conditions.   Past Medical History:  Diagnosis Date   Anxiety    Arthritis    Depression    GERD (gastroesophageal reflux disease)    PONV (postoperative nausea and vomiting)    Review of Systems:   + Low back pain    Assessment & Plan:   Low back pain Patient with history of chronic low back pain.  Most recent lumbar MRI with diffuse lumbar spine degeneration with progression from prior imaging.  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  There is also compressive spinal stenosis L2-L3 and L3-L4 and multi level foraminal stenosis in the lumbar region.  She has been on Tylenol 3 for past 2 years as well as intermittent use of ibuprofen and Voltaren gel.  She recently had a spinal injection by [facility] and that she said helped for a few days but then the pain immediately returned.  She feels that the pain is progressing.  She would not be interested in surgery moving forward.  Over the past few months the patient has been feeling her Tylenol No. 3 early.  Her last fill date was 07/27/2021 and she will be running out tomorrow.  She has been  taking 2 pills in the morning 2 pills in the evening and intermittent use of ibuprofen.  We discussed the harms of ibuprofen use in excess including renal disease and gastric ulcers.  This was discussed with the patient's PCP Dr. Daryll Drown.  We will refill her prescription tomorrow however there will be no further early refills per her primary care provider.  This was relayed to the patient.  She is unable to comply with the contract she will then need to be tapered off this medication.  We will have her follow-up with her primary care provider in our clinic to discuss these matters further in person.  Refill Tylenol 3 tomorrow, 08/13/2021.  70 pills 1 to 2 pills as needed every 8 hours for pain Follow-up with primary care provider in clinic to discuss further No further early refills    Patient discussed with Dr. Fanny Bien Internal Medicine Resident

## 2021-08-12 NOTE — Assessment & Plan Note (Addendum)
Patient with history of chronic low back pain.  Most recent lumbar MRI with diffuse lumbar spine degeneration with progression from prior imaging.  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  There is also compressive spinal stenosis L2-L3 and L3-L4 and multi level foraminal stenosis in the lumbar region.  She has been on Tylenol 3 for past 2 years as well as intermittent use of ibuprofen and Voltaren gel.  She recently had a spinal injection by [facility] and that she said helped for a few days but then the pain immediately returned.  She feels that the pain is progressing.  She would not be interested in surgery moving forward.  Over the past few months the patient has been feeling her Tylenol No. 3 early.  Her last fill date was 07/27/2021 and she will be running out tomorrow.  She has been taking 2 pills in the morning 2 pills in the evening and intermittent use of ibuprofen.  We discussed the harms of ibuprofen use in excess including renal disease and gastric ulcers.  This was discussed with the patient's PCP Dr. Daryll Drown.  We will refill her prescription tomorrow however there will be no further early refills per her primary care provider.  This was relayed to the patient.  She is unable to comply with the contract she will then need to be tapered off this medication.  We will have her follow-up with her primary care provider in our clinic to discuss these matters further in person.  I also recommended following up with a surgeon to discuss her concerns with surgery because at this point it is interfering with her life and ability to do things she enjoys. She will think about it  Refill Tylenol 3 tomorrow, 08/13/2021.  70 pills 1 to 2 pills as needed every 8 hours for pain Follow-up with primary care provider in clinic to discuss further No further early refills

## 2021-08-12 NOTE — Telephone Encounter (Signed)
acetaminophen-codeine (TYLENOL #3) 300-30 MG tablet, REFILL REQUEST @ [facility].  Pt would like to get this medication early. States she is having back pain. Please call pt back.

## 2021-08-14 ENCOUNTER — Other Ambulatory Visit: Payer: Self-pay | Admitting: Student

## 2021-08-14 MED ORDER — ACETAMINOPHEN-CODEINE 300-30 MG PO TABS: 1.0000 | ORAL_TABLET | Freq: Three times a day (TID) | ORAL | 0 refills | Status: DC | PRN

## 2021-08-22 NOTE — Progress Notes (Signed)
Internal Medicine Clinic Attending  Case discussed with Dr. Katsadouros at the time of the visit.  We reviewed the resident's history and exam and pertinent patient test results.  I agree with the assessment, diagnosis, and plan of care documented in the resident's note.  

## 2021-09-08 ENCOUNTER — Telehealth: Payer: Self-pay | Admitting: Internal Medicine

## 2021-09-08 DIAGNOSIS — G8929 Other chronic pain: Secondary | ICD-10-CM

## 2021-09-08 NOTE — Telephone Encounter (Signed)
Pt is requesting a new Referral to a Pain Clinic.  Please advise .  Pt states she was seen on 08/12/2021 by Dr. Johnney Ou.

## 2021-09-10 NOTE — Telephone Encounter (Signed)
Referral placed.   Gilles Chiquito, MD

## 2021-09-12 ENCOUNTER — Other Ambulatory Visit: Payer: Self-pay | Admitting: Internal Medicine

## 2021-09-12 DIAGNOSIS — F330 Major depressive disorder, recurrent, mild: Secondary | ICD-10-CM

## 2021-09-21 ENCOUNTER — Telehealth: Payer: Self-pay | Admitting: *Deleted

## 2021-09-21 NOTE — Telephone Encounter (Signed)
Flu Vaccine done at [facility] on 09/20/2021.

## 2021-09-29 ENCOUNTER — Telehealth: Payer: Self-pay | Admitting: Student

## 2021-09-29 NOTE — Telephone Encounter (Signed)
Patient called after hours line with back pain. Has a history of chronic back pain, however had a fall 2 days ago. Inially reports she fell off a ladder about 6 feet. Then said she fell off the side of the bed. Reports midline back pain. Denies other injury, syncope, numbness, tingling, loss of bowel/bladder control. She is requesting a refill on her  tylenol 3. Recommended she be evaluated given her acute fall and worsening back pain. She adamantly refused to be seen in clinic or at ED and ended the call prematurely.

## 2021-09-30 ENCOUNTER — Other Ambulatory Visit: Payer: Self-pay

## 2021-09-30 MED ORDER — ACETAMINOPHEN-CODEINE 300-30 MG PO TABS: 1.0000 | ORAL_TABLET | Freq: Three times a day (TID) | ORAL | 0 refills | Status: DC | PRN

## 2021-10-07 ENCOUNTER — Other Ambulatory Visit: Payer: Self-pay | Admitting: Internal Medicine

## 2021-10-07 DIAGNOSIS — K219 Gastro-esophageal reflux disease without esophagitis: Secondary | ICD-10-CM

## 2021-10-21 ENCOUNTER — Other Ambulatory Visit: Payer: Self-pay

## 2021-10-21 NOTE — Telephone Encounter (Signed)
Pt states she cannot find acetaminophen-codeine (TYLENOL #3) 300-30 MG tablet, states she probably misplaced it somewhere in the house. Requesting another Rx to be sent to the pharmacy, please call pt back.

## 2021-10-21 NOTE — Telephone Encounter (Signed)
Message left on patient's VM that the Clinics had returned her call and that a message would be sent to Dr.  Daryll Drown concernig her refill request.

## 2021-10-22 ENCOUNTER — Telehealth: Payer: Self-pay | Admitting: Student

## 2021-10-22 NOTE — Telephone Encounter (Signed)
1:53 PM received a page to call patient.  I spoke with her on the phone and confirmed ID.  Patient states that she placed 3 - 4 tablets of Tylenol # 3 in her purse and now she could not find her purse.  She last refilled her Tylenol # 3 on 09/30/2021 for 70 tablets.  She said that she typically refill them every 30 days.  She has been taking 1-2 tablets as needed every 8 hour but has been taking extra.  She also takes ibuprofen as well.  I explained to patient that we cannot replace lost controlled medications.  She also requested early refills for her Tylenol 3 earlier this year per Dr. Gaynelle Cage notes in July.  I advised patient to call the [facility] on Monday morning for this issue.  In the meantime advised patient to take regular Tylenol with ibuprofen as needed for pain.  Talking about her chronic back pain, she has DDD of T10-11 with spinal stenosis of L2-L3 and L3-L4.  When discussed about possible follow-up with neurosurgery, patient does not want to proceed with surgery because she does not want to leave her dog alone.  She has not followed-up in person with [facility] since 2022.  She also had a pain management referral placed in 09/10/21.  This is a complicated case.  I will reach out to her PCP, Dr. Daryll Drown, for assistance.

## 2021-10-24 MED ORDER — ACETAMINOPHEN-CODEINE 300-30 MG PO TABS: 1.0000 | ORAL_TABLET | Freq: Three times a day (TID) | ORAL | 0 refills | Status: DC | PRN

## 2021-10-24 NOTE — Telephone Encounter (Signed)
Thank you Dr. Alfonse Spruce.  I placed a refill and will call her tomorrow

## 2021-10-28 ENCOUNTER — Encounter: Payer: Self-pay | Admitting: *Deleted

## 2021-10-28 ENCOUNTER — Other Ambulatory Visit: Payer: Self-pay | Admitting: Internal Medicine

## 2021-10-28 DIAGNOSIS — J00 Acute nasopharyngitis [common cold]: Secondary | ICD-10-CM

## 2021-10-28 DIAGNOSIS — F330 Major depressive disorder, recurrent, mild: Secondary | ICD-10-CM

## 2021-10-31 ENCOUNTER — Other Ambulatory Visit: Payer: Self-pay | Admitting: *Deleted

## 2021-10-31 MED ORDER — DICLOFENAC SODIUM 1 % EX GEL: CUTANEOUS | 0 refills | Status: DC

## 2021-10-31 NOTE — Telephone Encounter (Signed)
Next appt scheduled 10/27 with PCP.

## 2021-11-03 ENCOUNTER — Other Ambulatory Visit: Payer: Self-pay | Admitting: Internal Medicine

## 2021-11-03 DIAGNOSIS — F330 Major depressive disorder, recurrent, mild: Secondary | ICD-10-CM

## 2021-11-18 ENCOUNTER — Ambulatory Visit (INDEPENDENT_AMBULATORY_CARE_PROVIDER_SITE_OTHER): Payer: Medicare Other | Admitting: Internal Medicine

## 2021-11-18 ENCOUNTER — Encounter: Payer: Self-pay | Admitting: Internal Medicine

## 2021-11-18 ENCOUNTER — Other Ambulatory Visit: Payer: Self-pay

## 2021-11-18 VITALS — BP 128/89 | HR 85 | Temp 98.7°F | Ht 61.0 in | Wt 185.4 lb

## 2021-11-18 DIAGNOSIS — M8580 Other specified disorders of bone density and structure, unspecified site: Secondary | ICD-10-CM | POA: Insufficient documentation

## 2021-11-18 DIAGNOSIS — M85851 Other specified disorders of bone density and structure, right thigh: Secondary | ICD-10-CM | POA: Diagnosis not present

## 2021-11-18 DIAGNOSIS — R61 Generalized hyperhidrosis: Secondary | ICD-10-CM

## 2021-11-18 DIAGNOSIS — H9209 Otalgia, unspecified ear: Secondary | ICD-10-CM | POA: Diagnosis not present

## 2021-11-18 DIAGNOSIS — M5442 Lumbago with sciatica, left side: Secondary | ICD-10-CM | POA: Diagnosis not present

## 2021-11-18 DIAGNOSIS — E669 Obesity, unspecified: Secondary | ICD-10-CM

## 2021-11-18 DIAGNOSIS — E559 Vitamin D deficiency, unspecified: Secondary | ICD-10-CM

## 2021-11-18 DIAGNOSIS — M5441 Lumbago with sciatica, right side: Secondary | ICD-10-CM

## 2021-11-18 DIAGNOSIS — G8929 Other chronic pain: Secondary | ICD-10-CM | POA: Diagnosis not present

## 2021-11-18 DIAGNOSIS — Z789 Other specified health status: Secondary | ICD-10-CM

## 2021-11-18 DIAGNOSIS — I10 Essential (primary) hypertension: Secondary | ICD-10-CM | POA: Diagnosis not present

## 2021-11-18 DIAGNOSIS — R7303 Prediabetes: Secondary | ICD-10-CM

## 2021-11-18 LAB — POCT GLYCOSYLATED HEMOGLOBIN (HGB A1C): Hemoglobin A1C: 5.6 % (ref 4.0–5.6)

## 2021-11-18 LAB — GLUCOSE, CAPILLARY: Glucose-Capillary: 118 mg/dL — ABNORMAL HIGH (ref 70–99)

## 2021-11-18 NOTE — Assessment & Plan Note (Addendum)
Patient reports left ear ache that has been going on for a few weeks and describes a wind-like sound in the ear. She has had chronic difficulty hearing in left ear. She has occasional tinnitus in ear infrequently and no tinnitus currently. On exam the external ear and ear canal are non-tender and without swelling bilaterally. The canal is clear without discharge bilaterally. The tympanic membrane is normal in appearance bilaterally. No acute concerns for infection. Ache and sounds she experiences in left ear could be related to her chronic hearing loss in this ear.  Plan Continue to monitor for acute worsening of hearing loss or infection

## 2021-11-18 NOTE — Assessment & Plan Note (Signed)
Her Vitamin D levels have been low.   Plan Re-check Vit. D level today Continue Vit. D supplementation (Patient reports taking)

## 2021-11-18 NOTE — Assessment & Plan Note (Signed)
A1c at 5.6% today WNL. She is on a vegetarian diet.  Continue encouraging healthy diet and exercise

## 2021-11-18 NOTE — Telephone Encounter (Signed)
ERROR

## 2021-11-18 NOTE — Assessment & Plan Note (Addendum)
DXA on 04/18/21 showed the BMD measured at Femur Neck Right is 0.786 g/cm2 with a T-score of -1.8. With this score, patient has osteopenia and is at an increased risk for fractures. Based on Fracture Risk Assessment Tool - FRAX - [employer], the ten year probability of fracture is 11% for major osteoporotic and 2.2% for hip.   Plan Close follow-up for re-checking DXA: repeat DXA 2 years since last scan (04/18/21) or 1 year if evidence of rapidly progressing disease Can considor bisphosphonate in the future, but no bisphosphonate at this time given FRAX score (major osteoporotic <20%, hip <3%) Vit. D supplementation

## 2021-11-18 NOTE — Progress Notes (Cosign Needed Addendum)
Subjective:   Patient ID: [name] female   DOB: September 11, 1950 71 y.o.   MRN: [id number]  HPI: [name] is a 71 y.o. woman with past medical history as listed below.  - She reports her back pain is at 2/10 pain and it is worse on some days. Her pain is reasonably controlled with her current pain medication regimen. She has no significant issue with walking, but she has difficulty with bending at the hip. - She reports excessive sweating that has been going on for a while. It occurs when she goes out to run errands such as going to the grocery store. She explains the back of her head becomes drenched in sweat. - She reports an ear ache in her left ear and a windy sound that has been going on for a few weeks. - She took her BP medication this morning and is otherwise feeling all right and enjoys spending time with her dog.   Review of Systems: Constitutional: Endorses excessive sweating occasionally.  HEENT: No tinnitus MSK: Endorses back pain GI: No abdominal pain. No nausea or vomiting. No issues with bowel movements GU: No issues with urination  Past Medical History:  Diagnosis Date   Anxiety    Arthritis    Depression    GERD (gastroesophageal reflux disease)    PONV (postoperative nausea and vomiting)      Patient Active Problem List   Diagnosis Date Noted   Viral upper respiratory illness 02/11/2021   Hyperhidrosis 09/07/2020   Vitamin D deficiency 04/18/2019   Substernal chest pain 04/18/2019   Obesity (BMI 30-39.9) 03/09/2017   Cataract 08/30/2016   Osteoarthritis of multiple joints 07/01/2014   GERD (gastroesophageal reflux disease) 05/28/2014   Pre-diabetes 12/17/2013   Chronic sinusitis 04/30/2013   Anxiety state 02/19/2013   Major depressive disorder, recurrent episode ([place]) 04/22/2012   Financial difficulties 02/19/2012   Routine adult health maintenance 11/06/2011   Vegetarian diet 08/18/2011   Diverticulosis 04/19/2011   Low  back pain 06/17/2009   Hyperlipidemia 02/20/2008   HYPERTENSION, BENIGN ESSENTIAL 10/23/2007   Insomnia 03/28/2006     Current Outpatient Medications  Medication Sig Dispense Refill   acetaminophen-codeine (TYLENOL #3) 300-30 MG tablet Take 1-2 tablets by mouth every 8 (eight) hours as needed for moderate pain. 70 tablet 0   ALPRAZolam (XANAX) 0.5 MG tablet TAKE 1 TO 2 TABLETS BY MOUTH AS NEEDED FOR ANXIETY . DO NOT EXCEED 4 PER 24 HOURS 120 tablet 0   amLODipine (NORVASC) 5 MG tablet Take 1 tablet (5 mg total) by mouth daily. 90 tablet 3   buPROPion (WELLBUTRIN) 75 MG tablet TAKE 2 TABLETS BY MOUTH  TWICE DAILY 360 tablet 3   diclofenac Sodium (VOLTAREN) 1 % GEL APPLY 4 GRAMS TOPICALLY 4 TIMES DAILY  Strength: 1 % 300 g 0   escitalopram (LEXAPRO) 20 MG tablet TAKE 1 TABLET BY MOUTH  DAILY 90 tablet 3   fluticasone (FLONASE) 50 MCG/ACT nasal spray USE 1 SPRAY(S) IN EACH NOSTRIL ONCE DAILY FOR 14 DAYS 16 g 0   omeprazole (PRILOSEC) 20 MG capsule TAKE 1 CAPSULE BY MOUTH DAILY 90 capsule 3   No current facility-administered medications for this visit.   Objective:   Physical Exam: Vitals:   11/18/21 0814 11/18/21 0912  BP: (!) 146/80 128/89  Pulse: 82 85  Temp: 98.7 F (37.1 C)   TempSrc: Oral   SpO2: 95%   Weight: 185 lb 6.4 oz (84.1 kg)  Height: '5\' 1"'$  (1.549 m)    General: Pleasant, not in acute distress. HEENT: The external ear and ear canal are non-tender and without swelling bilaterally. The canal is clear without discharge bilaterally. The tympanic membrane is normal in appearance bilaterally. Cardiovascular: Normal rate, regular rhythm, no murmurs.  Respiratory: Regular work of breathing on room air. CTAB. Abdominal: Soft, non-distended, non-tender. Bowel sounds present. Neuro: No apparent focal deficits.  Assessment & Plan:   HYPERTENSION, BENIGN ESSENTIAL Initial BP 146/80, re-check 129/89. BP well-controlled. She reports she takes her BP medications routinely and  took them this morning.  Continue amlodipine 5 mg daily  Low back pain Her back pain is reasonably controlled with her current pain medication regimen of Tylenol No. 3, Voltaren gel, and OTC ibuprofen. Current pain 2/10, some days are worse than others. She has no significant issue with walking, but she has difficulty with bending at the hip. She is not interested in surgery at this time, and reports the spinal injection with PM&R helped briefly, but her pain returned shortly after.  Continue Tylenol No. 3 and Voltaren gel  Pre-diabetes A1c at 5.6% today WNL. She is on a vegetarian diet.  Continue encouraging healthy diet and exercise  Osteopenia DXA on 04/18/21 showed the BMD measured at Femur Neck Right is 0.786 g/cm2 with a T-score of -1.8. With this score, patient has osteopenia and is at an increased risk for fractures. Based on Fracture Risk Assessment Tool - FRAX - [employer], the ten year probability of fracture is 11% for major osteoporotic and 2.2% for hip.   Plan Close follow-up for re-checking DXA: repeat DXA 2 years since last scan (04/18/21) or 1 year if evidence of rapidly progressing disease Can considor bisphosphonate in the future, but no bisphosphonate at this time given FRAX score (major osteoporotic <20%, hip <3%) Vit. D supplementation  Vitamin D deficiency Her Vitamin D levels have been low.   Plan Re-check Vit. D level today Continue Vit. D supplementation (Patient reports taking)  Ear ache Patient reports left ear ache that has been going on for a few weeks and describes a wind-like sound in the ear. She has had chronic difficulty hearing in left ear. She has occasional tinnitus in ear infrequently and no tinnitus currently. On exam the external ear and ear canal are non-tender and without swelling bilaterally. The canal is clear without discharge bilaterally. The tympanic membrane is normal in appearance bilaterally. No acute concerns for infection.  Ache and sounds she experiences in left ear could be related to her chronic hearing loss in this ear.  Plan Continue to monitor for acute worsening of hearing loss or infection  Major depressive disorder, recurrent episode PHQ-9 at 10 today. 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 Most days she feels all right and some days are worse than others. Her medication regimen appears to be helping with anxiety and depression. Her heavy sweating with activity could be related to medication side effects.  Plan Continue Lexapro 20 mg Wean off Wellbutrin given patient's excessive sweating. Currently 75 mg BID. Advised her to decrease to 75 mg once a day for two weeks, then discontinue taking medication.  Amritpal Shropshire (Max) Lozano, MS3 11/18/2021

## 2021-11-18 NOTE — Assessment & Plan Note (Addendum)
PHQ-9 at 10 today. 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 Most days she feels all right and some days are worse than others. Her medication regimen appears to be helping with anxiety and depression. Her heavy sweating with activity could be related to medication side effects.  Plan Continue Lexapro 20 mg Wean off Wellbutrin given patient's excessive sweating. Currently 75 mg BID. Advised her to decrease to 75 mg once a day for two weeks, then discontinue taking medication.

## 2021-11-18 NOTE — Assessment & Plan Note (Signed)
Initial BP 146/80, re-check 129/89. BP well-controlled. She reports she takes her BP medications routinely and took them this morning.  Continue amlodipine 5 mg daily

## 2021-11-18 NOTE — Progress Notes (Deleted)
   Established Patient Office Visit  Subjective   Patient ID: [name], female    DOB: 03/16/1950  Age: 71 y.o. MRN: [id number]  Chief Complaint  Patient presents with   Annual Exam    Excessive sweating. Back pain continues. Ear ache.    HPI  {History (Optional):23778}  ROS    Objective:     BP (!) 146/80 (BP Location: Left Arm, Patient Position: Sitting, Cuff Size: Normal)   Pulse 82   Temp 98.7 F (37.1 C) (Oral)   Ht '5\' 1"'$  (1.549 m)   Wt 185 lb 6.4 oz (84.1 kg)   SpO2 95% Comment: RA  BMI 35.03 kg/m  {Vitals History (Optional):23777}  Physical Exam   Results for orders placed or performed in visit on 11/18/21  Glucose, capillary  Result Value Ref Range   Glucose-Capillary 118 (H) 70 - 99 mg/dL  POC Hbg A1C  Result Value Ref Range   Hemoglobin A1C 5.6 4.0 - 5.6 %   HbA1c POC (<> result, manual entry)     HbA1c, POC (prediabetic range)     HbA1c, POC (controlled diabetic range)      {Labs (Optional):23779}  The ASCVD Risk score (Arnett DK, et al., 2019) failed to calculate for the following reasons:   Cannot find a previous HDL lab   Cannot find a previous total cholesterol lab    Assessment & Plan:   Problem List Items Addressed This Visit       Unprioritized   Pre-diabetes - Primary   Relevant Orders   POC Hbg A1C (Completed)    No follow-ups on file.    Gilles Chiquito, MD

## 2021-11-18 NOTE — Patient Instructions (Addendum)
Ms. Madkins,  It was great seeing you this morning.  - We will keep with your current medication regimen for your back pain.  - For your sweating, medication side effects could be contributing to this. We will wean you off Wellbutrin and see if that helps decrease the sweating.  - Today, we will check your Vitamin D levels and provide you with Vit. D if needed given your decreased bone density and check another bone density in the future - We will also check your blood counts and kidney function today  Please note the following changes to your medications:  Decrease Bupropion (Wellbutrin) from 2 tablets daily to 1 tablet for two weeks. After that stop taking this medication.  Please call our clinic if you have any questions or concerns, we may be able to help and keep you from a long and expensive emergency room wait. Our clinic and after hours phone number is [phone], the best time to call is Monday through Friday 9 am to 4 pm but there is always someone available 24/7 if you have an emergency. If you need medication refills please notify your pharmacy one week in advance and they will send [place] a request.   Thank you for allowing [place] to be a part of your care,  Max and Dr. Daryll Drown

## 2021-11-18 NOTE — Assessment & Plan Note (Signed)
Her back pain is reasonably controlled with her current pain medication regimen of Tylenol No. 3, Voltaren gel, and OTC ibuprofen. Current pain 2/10, some days are worse than others. She has no significant issue with walking, but she has difficulty with bending at the hip. She is not interested in surgery at this time, and reports the spinal injection with PM&R helped briefly, but her pain returned shortly after.  Continue Tylenol No. 3 and Voltaren gel

## 2021-11-19 LAB — BMP8+ANION GAP
Anion Gap: 19 mmol/L — ABNORMAL HIGH (ref 10.0–18.0)
BUN/Creatinine Ratio: 12 (ref 12–28)
BUN: 11 mg/dL (ref 8–27)
CO2: 20 mmol/L (ref 20–29)
Calcium: 10.4 mg/dL — ABNORMAL HIGH (ref 8.7–10.3)
Chloride: 100 mmol/L (ref 96–106)
Creatinine, Ser: 0.89 mg/dL (ref 0.57–1.00)
Glucose: 90 mg/dL (ref 70–99)
Potassium: 3.9 mmol/L (ref 3.5–5.2)
Sodium: 139 mmol/L (ref 134–144)
eGFR: 68 mL/min/{1.73_m2} (ref >59)

## 2021-11-19 LAB — CBC
Hematocrit: 41.7 % (ref 34.0–46.6)
Hemoglobin: 13.4 g/dL (ref 11.1–15.9)
MCH: 29.3 pg (ref 26.6–33.0)
MCHC: 32.1 g/dL (ref 31.5–35.7)
MCV: 91 fL (ref 79–97)
Platelets: 313 10*3/uL (ref 150–450)
RBC: 4.58 x10E6/uL (ref 3.77–5.28)
RDW: 12.3 % (ref 11.7–15.4)
WBC: 8.4 10*3/uL (ref 3.4–10.8)

## 2021-11-19 LAB — VITAMIN D 25 HYDROXY (VIT D DEFICIENCY, FRACTURES): Vit D, 25-Hydroxy: 37.7 ng/mL (ref 30.0–100.0)

## 2021-11-21 ENCOUNTER — Telehealth: Payer: Self-pay

## 2021-11-21 NOTE — Telephone Encounter (Signed)
Requesting lab results, please call pt back.  

## 2021-11-21 NOTE — Progress Notes (Signed)
Attestation for Student Documentation:  I personally was present and performed or re-performed the history, physical exam and medical decision-making activities of this service and have verified that the service and findings are accurately documented in the student's note.  Ms. Daw has had chronic sweating for at least a year.  She feels it might be related to her wellbutrin.  She has been on prozac before and did not like the effects.  We will wean the wellbutrin.  If the sweating improves, we will try another option for MDD and anxiety.   As concerns her hearing, she has had chronic sinusitis and ear pain off an on.  She has seen ENT in the past.  Today, based on exam, she does not appear to have an infection.  We will continue to monitor.   Sid Falcon, MD 11/21/2021, 9:19 AM

## 2021-11-23 NOTE — Telephone Encounter (Signed)
Sent by [employer].

## 2021-11-28 ENCOUNTER — Other Ambulatory Visit: Payer: Self-pay

## 2021-11-29 NOTE — Telephone Encounter (Signed)
This was just filled yesterday.  Patient should call back when refill due.

## 2021-12-12 ENCOUNTER — Other Ambulatory Visit: Payer: Self-pay | Admitting: Internal Medicine

## 2021-12-12 DIAGNOSIS — F330 Major depressive disorder, recurrent, mild: Secondary | ICD-10-CM

## 2021-12-22 ENCOUNTER — Other Ambulatory Visit: Payer: Self-pay | Admitting: Internal Medicine

## 2021-12-22 DIAGNOSIS — J00 Acute nasopharyngitis [common cold]: Secondary | ICD-10-CM

## 2021-12-26 ENCOUNTER — Encounter: Payer: Self-pay | Admitting: Internal Medicine

## 2021-12-26 MED ORDER — ACETAMINOPHEN-CODEINE 300-30 MG PO TABS: 1.0000 | ORAL_TABLET | Freq: Three times a day (TID) | ORAL | 0 refills | Status: DC | PRN

## 2022-01-06 ENCOUNTER — Other Ambulatory Visit: Payer: Self-pay | Admitting: Internal Medicine

## 2022-01-06 ENCOUNTER — Ambulatory Visit: Payer: Self-pay | Admitting: Licensed Clinical Social Worker

## 2022-01-06 DIAGNOSIS — F3341 Major depressive disorder, recurrent, in partial remission: Secondary | ICD-10-CM

## 2022-01-06 DIAGNOSIS — F419 Anxiety disorder, unspecified: Secondary | ICD-10-CM

## 2022-01-06 NOTE — Patient Outreach (Signed)
SW removed from care team.  Shammond Arave, BSW , MSW, LCSW-A Social Worker [facility]  [phone]   

## 2022-01-17 ENCOUNTER — Telehealth: Payer: Self-pay | Admitting: Internal Medicine

## 2022-01-17 NOTE — Telephone Encounter (Signed)
Received page that this patient was requesting a call back from on-call resident. Called patient 15 min after initial page came in, however, she did not answer. I attempted to call a few hours later, but again, she did not pick up. No alternate contact on file.   Buddy Duty, DO Internal Medicine Resident, PGY-2

## 2022-01-26 NOTE — Telephone Encounter (Signed)
In error

## 2022-02-06 ENCOUNTER — Other Ambulatory Visit: Payer: Self-pay | Admitting: Internal Medicine

## 2022-02-06 DIAGNOSIS — F330 Major depressive disorder, recurrent, mild: Secondary | ICD-10-CM

## 2022-02-09 ENCOUNTER — Other Ambulatory Visit: Payer: Self-pay | Admitting: Internal Medicine

## 2022-02-10 MED ORDER — ACETAMINOPHEN-CODEINE 300-30 MG PO TABS: 1.0000 | ORAL_TABLET | Freq: Three times a day (TID) | ORAL | 0 refills | Status: DC | PRN

## 2022-02-10 NOTE — Telephone Encounter (Signed)
Pt rtn call about her Med Request  acetaminophen-codeine (TYLENOL #3) 300-30 MG tablet   [facility] [place], [place]

## 2022-02-13 ENCOUNTER — Telehealth: Payer: Self-pay

## 2022-02-13 NOTE — Telephone Encounter (Signed)
RTC to patient requesting moe Tylenol with Codiene.  Patient  states takes 4 tablets a day.  2 in the am and 2 at bedtime. Prescription says to take 1 to 2 tablets patient has been taking   2 tablets 2 times a day.  Patient also wanted ti discuss her Xanax  Stated has to have it daily as well.  Wants to discuss with Dr. Daryll Drown about as well.With prescription written for 70 tablets. Only got 18 tablets pharmacist unsure as to why she only got 18 tablets. PA needed to get remainder of tablets.  Pharmacy to send over request for. Will send to front desk to schedule appointment for patient.Marland Kitchen

## 2022-02-13 NOTE — Telephone Encounter (Signed)
I will call [name] this afternoon.   There is no indication to increase her medication, she has been on the same dose for some time and has been told that she should not request early refills in the past (Clinic note from July 2023).    Thank you

## 2022-02-13 NOTE — Telephone Encounter (Signed)
Pt states her insurance will not covered Tylenol 3, requesting a call back.

## 2022-02-15 NOTE — Telephone Encounter (Signed)
Patient is scheduled to come in on Friday February 2,2024  at 8:25 to see Dr.Mullen

## 2022-02-15 NOTE — Telephone Encounter (Addendum)
Lvm for patient to give [place] a call back to schedule a fu visit to discuss medications.

## 2022-02-17 ENCOUNTER — Telehealth: Payer: Self-pay

## 2022-02-17 NOTE — Telephone Encounter (Signed)
Pa for pt ( ACET-CODEINE  TAB)   came through on cover my meds was submitted with last office notes  for pain .Marland Kitchen Awaiting approval or denial

## 2022-02-20 NOTE — Telephone Encounter (Signed)
DECISION :    DENIED       Decision Notes: APAP/CODEINE TAB 300-'30MG'$  is denied because the information provided was not sufficient to support approval for medical necessity. The requested drug is subject to an opioidbenzodiazepine combination therapy safety exception. The following required information was not provided and/or clarified: One of the following: (1) Confirmation that the requested opioid is being used to manage active cancer pain or pain related to sickle cell disease. (2) One of the following to manage non-cancer pain: (A) You are in a long-term care facility (for example: hospital or skilled nursing facility where you are receiving skilled nursing care). (B) A written or verbal statement is received from the requesting prescriber saying that the use of the opioid drug with a benzodiazepine is safe and medically necessary for you.

## 2022-02-24 ENCOUNTER — Other Ambulatory Visit: Payer: Self-pay

## 2022-02-24 ENCOUNTER — Encounter: Payer: Self-pay | Admitting: Internal Medicine

## 2022-02-24 ENCOUNTER — Ambulatory Visit (INDEPENDENT_AMBULATORY_CARE_PROVIDER_SITE_OTHER): Payer: 59 | Admitting: Internal Medicine

## 2022-02-24 VITALS — BP 100/57 | HR 86 | Temp 99.5°F | Ht 61.0 in | Wt 186.5 lb

## 2022-02-24 DIAGNOSIS — I10 Essential (primary) hypertension: Secondary | ICD-10-CM | POA: Diagnosis not present

## 2022-02-24 DIAGNOSIS — M4805 Spinal stenosis, thoracolumbar region: Secondary | ICD-10-CM

## 2022-02-24 DIAGNOSIS — M159 Polyosteoarthritis, unspecified: Secondary | ICD-10-CM

## 2022-02-24 DIAGNOSIS — M4800 Spinal stenosis, site unspecified: Secondary | ICD-10-CM | POA: Insufficient documentation

## 2022-02-24 DIAGNOSIS — F332 Major depressive disorder, recurrent severe without psychotic features: Secondary | ICD-10-CM

## 2022-02-24 DIAGNOSIS — M150 Primary generalized (osteo)arthritis: Secondary | ICD-10-CM

## 2022-02-24 MED ORDER — METHOCARBAMOL 500 MG PO TABS: 500.0000 mg | ORAL_TABLET | Freq: Three times a day (TID) | ORAL | 0 refills | Status: DC | PRN

## 2022-02-24 MED ORDER — PREGABALIN 25 MG PO CAPS: 25.0000 mg | ORAL_CAPSULE | Freq: Two times a day (BID) | ORAL | 2 refills | Status: DC

## 2022-02-24 MED ORDER — AMLODIPINE BESYLATE 2.5 MG PO TABS: 2.5000 mg | ORAL_TABLET | Freq: Every day | ORAL | 3 refills | Status: DC

## 2022-02-24 NOTE — Assessment & Plan Note (Signed)
She has had issues with this in the past.  Her PHQ 9 is consistently elevated.  She is more flat in her affect today.  Her pain is definitely contributing as she is not able to care as much for her dog as she would like due to pain.    We will treat the pain today.  Continue lexapro and reassess in 4-8 weeks.

## 2022-02-24 NOTE — Assessment & Plan Note (Signed)
This was the main issue we discussed today.  She has not tried any other wraparound therapy for her pain.  She is not interested in a higher strength narcotic medication.  She has tried gabapentin without success.   Plan Start Robaxin PRN Start Lyrica '25mg'$  BID (starting low) Continue tylenol #3 If we are not able to get her some relief with this regimen, referral to pain specialist.  Return in 1-2 months for evaluation of pain.

## 2022-02-24 NOTE — Progress Notes (Signed)
Established Patient Office Visit  Subjective   Patient ID: [name], female    DOB: May 24, 1950  Age: 72 y.o. MRN: [id number]  Chief Complaint  Patient presents with   Discuss medications   Back Pain    #10 - radiating down left leg.    Ms. Clinkenbeard is a 72 year old woman with PMH of spinal stenosis, HTN, obesity, anxiety and depression, HLD who presents for follow up.   Ms. Sarin notes that her back pain has worsened, she has radiating pain to her thighs bilaterally due to the pain.  She has been using her tylenol #3 more than prescribed and she will run out soon.  We discussed the nature of her pain and we will try to augment her pain management today.    Her BP is mildly low.  She has no symptoms, no chest pain (does note shoulder pain after picking up her dog), no dizziness or headache.   Otherwise, her PHQ-9 is high today, she is on lexapro chronically.  She will likely need augmentation, however, this has been complicated in the past by medication adherence and side effects.     Patient Active Problem List   Diagnosis Date Noted   HYPERTENSION, BENIGN ESSENTIAL 10/23/2007    Priority: Medium    Spinal stenosis 02/24/2022   Osteopenia 11/18/2021   Vitamin D deficiency 04/18/2019   Obesity (BMI 30-39.9) 03/09/2017   Cataract 08/30/2016   Osteoarthritis of multiple joints 07/01/2014   GERD (gastroesophageal reflux disease) 05/28/2014   Pre-diabetes 12/17/2013   Chronic sinusitis 04/30/2013   Anxiety state 02/19/2013   Major depressive disorder, recurrent episode ([place]) 04/22/2012   Financial difficulties 02/19/2012   Routine adult health maintenance 11/06/2011   Vegetarian diet 08/18/2011   Diverticulosis 04/19/2011   Low back pain 06/17/2009   Hyperlipidemia 02/20/2008   Insomnia 03/28/2006      Review of Systems  Constitutional:  Negative for chills, fever and weight loss.  Respiratory:  Negative for cough and wheezing.   Cardiovascular:   Negative for chest pain and leg swelling.  Musculoskeletal:  Positive for back pain, joint pain and myalgias.  Neurological:  Negative for dizziness, weakness and headaches.  Psychiatric/Behavioral:  Positive for depression. Negative for suicidal ideas. The patient is nervous/anxious and has insomnia.       Objective:     BP (!) 100/57 (BP Location: Right Arm, Patient Position: Sitting, Cuff Size: Normal)   Pulse 86   Temp 99.5 F (37.5 C) (Oral)   Ht '5\' 1"'$  (1.549 m)   Wt 186 lb 8 oz (84.6 kg)   SpO2 93% Comment: RA  BMI 35.24 kg/m  BP Readings from Last 3 Encounters:  02/24/22 (!) 100/57  11/18/21 128/89  07/04/21 123/85   Wt Readings from Last 3 Encounters:  02/24/22 186 lb 8 oz (84.6 kg)  11/18/21 185 lb 6.4 oz (84.1 kg)  02/14/21 182 lb 9.6 oz (82.8 kg)      Physical Exam Vitals and nursing note reviewed.  Constitutional:      General: She is not in acute distress.    Appearance: Normal appearance. She is not toxic-appearing.  HENT:     Head: Normocephalic and atraumatic.  Pulmonary:     Effort: Pulmonary effort is normal. No respiratory distress.  Musculoskeletal:        General: No swelling or tenderness. Normal range of motion.     Right lower leg: No edema.     Left lower leg: No  edema.  Neurological:     Mental Status: She is alert and oriented to person, place, and time. Mental status is at baseline.     Motor: No weakness.     Gait: Gait normal.  Psychiatric:        Attention and Perception: Attention normal.        Mood and Affect: Mood is depressed. Affect is flat.        Speech: Speech normal.        Behavior: Behavior is withdrawn. Behavior is cooperative.        Thought Content: Thought content normal.        Cognition and Memory: Cognition normal.        Judgment: Judgment normal.      No results found for any visits on 02/24/22.  The ASCVD Risk score (Arnett DK, et al., 2019) failed to calculate for the following reasons:   Cannot find  a previous HDL lab   Cannot find a previous total cholesterol lab    Assessment & Plan:   Problem List Items Addressed This Visit       Medium    HYPERTENSION, BENIGN ESSENTIAL (Chronic)    BP lower today, asymptomatic, but lower than need be given we are starting some medications that might cause dizziness.   Plan Decrease amlodipine to 2.'5mg'$  daily Check BMET today.       Relevant Medications   amLODipine (NORVASC) 2.5 MG tablet   Other Relevant Orders   BMP8+Anion Gap     Unprioritized   Major depressive disorder, recurrent episode ([place]) (Chronic)    She has had issues with this in the past.  Her PHQ 9 is consistently elevated.  She is more flat in her affect today.  Her pain is definitely contributing as she is not able to care as much for her dog as she would like due to pain.    We will treat the pain today.  Continue lexapro and reassess in 4-8 weeks.       Spinal stenosis (Chronic)    This was the main issue we discussed today.  She has not tried any other wraparound therapy for her pain.  She is not interested in a higher strength narcotic medication.  She has tried gabapentin without success.   Plan Start Robaxin PRN Start Lyrica '25mg'$  BID (starting low) Continue tylenol #3 If we are not able to get her some relief with this regimen, referral to pain specialist.  Return in 1-2 months for evaluation of pain.       Osteoarthritis of multiple joints - Primary   Relevant Medications   pregabalin (LYRICA) 25 MG capsule   methocarbamol (ROBAXIN) 500 MG tablet    Return in about 6 weeks (around 04/07/2022).    Gilles Chiquito, MD

## 2022-02-24 NOTE — Patient Instructions (Addendum)
[name] - -  Thank you for coming in today.   For your back pain with radiation down the leg, we know that you have spinal stenosis.  We will attempt to do wraparound pain management with tylenol #3, a muscle relaxer (Robaxin) and Lyrica (nerve medication).  All of these medications can cause drowsiness so please be careful taking them together.   If we do not have good pain control OR the medications are too sedating, I will plan to send you to pain management.   Finally, your blood pressure is low today.  Please DECREASE your amlodipine to 2.'5mg'$  daily.  I have sent in a new prescription for you.   Come back to see me in 1-2 months to see how your pain is doing.   Thank you!

## 2022-02-24 NOTE — Assessment & Plan Note (Signed)
BP lower today, asymptomatic, but lower than need be given we are starting some medications that might cause dizziness.   Plan Decrease amlodipine to 2.'5mg'$  daily Check BMET today.

## 2022-02-25 ENCOUNTER — Encounter: Payer: Self-pay | Admitting: Internal Medicine

## 2022-02-25 LAB — BMP8+ANION GAP
Anion Gap: 14 mmol/L (ref 10.0–18.0)
BUN/Creatinine Ratio: 20 (ref 12–28)
BUN: 20 mg/dL (ref 8–27)
CO2: 22 mmol/L (ref 20–29)
Calcium: 10.6 mg/dL — ABNORMAL HIGH (ref 8.7–10.3)
Chloride: 100 mmol/L (ref 96–106)
Creatinine, Ser: 1.02 mg/dL — ABNORMAL HIGH (ref 0.57–1.00)
Glucose: 105 mg/dL — ABNORMAL HIGH (ref 70–99)
Potassium: 4.4 mmol/L (ref 3.5–5.2)
Sodium: 136 mmol/L (ref 134–144)
eGFR: 58 mL/min/{1.73_m2} — ABNORMAL LOW (ref >59)

## 2022-02-28 NOTE — Telephone Encounter (Signed)
Patient called regarding her rx for acetaminophen-oxycodone patient stated her insurance denied her rx, patient is requesting a letter to be sent to her insurance stating you are okay with her taking the medication and her needs for medication. Patient stated she has been taking Advil which is not helping with her pain. Please call patient to discuss further.

## 2022-03-03 ENCOUNTER — Encounter: Payer: Self-pay | Admitting: Internal Medicine

## 2022-03-03 NOTE — Telephone Encounter (Signed)
Pt PCP ( DR [facility] ) has  written a letter to explain to  insurance why pt is in need of the med  I have completed the appeal  form and attached the letter  and faxed it off the the appeal department  @ [phone]

## 2022-03-07 ENCOUNTER — Other Ambulatory Visit: Payer: Self-pay

## 2022-03-07 MED ORDER — ACETAMINOPHEN-CODEINE 300-30 MG PO TABS: 1.0000 | ORAL_TABLET | Freq: Three times a day (TID) | ORAL | 0 refills | Status: DC | PRN

## 2022-03-07 NOTE — Telephone Encounter (Signed)
DECISION :  Approved  from 02/17/2022 through  01/23/2023 .Marland Kitchen     Decision was sent to patient home  ... Pt called to notify me of the decision  .. I instructed  pt to take the approval  letter to pharmacy ... And I notified PCP  via chat  that the med was now approved and refill request was sent .Marland Kitchen...   Pharmacy also faxed me a copy of approval letter .... Was placed to be scanned to chart .Marland Kitchen

## 2022-03-09 ENCOUNTER — Other Ambulatory Visit: Payer: Self-pay | Admitting: Internal Medicine

## 2022-03-09 DIAGNOSIS — M159 Polyosteoarthritis, unspecified: Secondary | ICD-10-CM

## 2022-03-09 NOTE — Addendum Note (Signed)
Addended by: Gilles Chiquito B on: 03/09/2022 09:26 AM   Modules accepted: Orders

## 2022-03-10 ENCOUNTER — Ambulatory Visit (INDEPENDENT_AMBULATORY_CARE_PROVIDER_SITE_OTHER): Payer: 59 | Admitting: Internal Medicine

## 2022-03-10 DIAGNOSIS — M159 Polyosteoarthritis, unspecified: Secondary | ICD-10-CM

## 2022-03-10 DIAGNOSIS — M4805 Spinal stenosis, thoracolumbar region: Secondary | ICD-10-CM

## 2022-03-10 MED ORDER — METHOCARBAMOL 500 MG PO TABS: 500.0000 mg | ORAL_TABLET | Freq: Three times a day (TID) | ORAL | 0 refills | Status: DC | PRN

## 2022-03-10 NOTE — Progress Notes (Signed)
  [facility] Telephone Encounter Continuity Care Appointment  HPI:  This telephone encounter was created for [name] on 03/10/2022 for the following purpose/cc follow up of back pain. Reporting pain in the morning, used her muscle relaxer and this helped her.  Using the lyrica.  Insurance approved her tylenol #3.  Pain is better controlled at this time.  Calcium: She notes eating a lot of yogurt and cheese, curious about why this is elevated.  We spoke about hyperparathyroidism and checking labs.  This might be due to dietary issues, but we want to be sure and check.    Past Medical History:  Past Medical History:  Diagnosis Date   Anxiety    Arthritis    Depression    GERD (gastroesophageal reflux disease)    PONV (postoperative nausea and vomiting)      ROS:  No signs symptoms of hypercalcemia (pain is chronic for her, no abdominal pain, no psychiatric issues).     Assessment / Plan / Recommendations:  Please see A&P under problem oriented charting for assessment of the patient's acute and chronic medical conditions.  As always, pt is advised that if symptoms worsen or new symptoms arise, they should go to an urgent care facility or to to ER for further evaluation.   Consent and Medical Decision Making:  This is a telephone encounter between [name] and [name] on 03/10/2022 for follow up of pain management. The visit was conducted with the patient located at home and [name] at [facility]. The patient's identity was confirmed using their DOB and current address. The patient has consented to being evaluated through a telephone encounter and understands the associated risks (an examination cannot be done and the patient may need to come in for an appointment) / benefits (allows the patient to remain at home, decreasing exposure to coronavirus). I personally spent 10 minutes on medical discussion.

## 2022-03-10 NOTE — Patient Instructions (Signed)
Given over phone.

## 2022-03-10 NOTE — Assessment & Plan Note (Signed)
Pain moderately improved with addition of PRN robaxin and lyrica.   I advised her to take Robaxin only on bad days or at night to help with muscle pain after working/walking during the day.   She will continue tylenol #3.    If no further improvement, or worsening in next six months, will plan for pain management consult.

## 2022-03-16 ENCOUNTER — Other Ambulatory Visit: Payer: Self-pay | Admitting: Internal Medicine

## 2022-03-16 DIAGNOSIS — F330 Major depressive disorder, recurrent, mild: Secondary | ICD-10-CM

## 2022-03-20 ENCOUNTER — Other Ambulatory Visit: Payer: Self-pay | Admitting: Internal Medicine

## 2022-03-20 DIAGNOSIS — F330 Major depressive disorder, recurrent, mild: Secondary | ICD-10-CM

## 2022-03-21 ENCOUNTER — Other Ambulatory Visit: Payer: Self-pay | Admitting: Internal Medicine

## 2022-03-21 DIAGNOSIS — M159 Polyosteoarthritis, unspecified: Secondary | ICD-10-CM

## 2022-03-21 DIAGNOSIS — J00 Acute nasopharyngitis [common cold]: Secondary | ICD-10-CM

## 2022-03-22 ENCOUNTER — Other Ambulatory Visit: Payer: Self-pay | Admitting: Internal Medicine

## 2022-03-22 DIAGNOSIS — M159 Polyosteoarthritis, unspecified: Secondary | ICD-10-CM

## 2022-03-22 DIAGNOSIS — F330 Major depressive disorder, recurrent, mild: Secondary | ICD-10-CM

## 2022-03-22 MED ORDER — METHOCARBAMOL 500 MG PO TABS: 500.0000 mg | ORAL_TABLET | Freq: Three times a day (TID) | ORAL | 0 refills | Status: DC | PRN

## 2022-03-22 MED ORDER — FLUTICASONE PROPIONATE 50 MCG/ACT NA SUSP: 1.0000 | Freq: Every day | NASAL | 0 refills | Status: DC

## 2022-04-04 ENCOUNTER — Other Ambulatory Visit: Payer: Self-pay | Admitting: Internal Medicine

## 2022-04-04 DIAGNOSIS — F330 Major depressive disorder, recurrent, mild: Secondary | ICD-10-CM

## 2022-04-06 ENCOUNTER — Other Ambulatory Visit: Payer: Self-pay | Admitting: Internal Medicine

## 2022-04-12 ENCOUNTER — Other Ambulatory Visit: Payer: Self-pay | Admitting: Internal Medicine

## 2022-04-12 DIAGNOSIS — M159 Polyosteoarthritis, unspecified: Secondary | ICD-10-CM

## 2022-04-17 ENCOUNTER — Other Ambulatory Visit: Payer: Self-pay | Admitting: Internal Medicine

## 2022-04-17 DIAGNOSIS — F330 Major depressive disorder, recurrent, mild: Secondary | ICD-10-CM

## 2022-04-18 ENCOUNTER — Other Ambulatory Visit: Payer: Self-pay | Admitting: Internal Medicine

## 2022-04-25 ENCOUNTER — Other Ambulatory Visit: Payer: Self-pay | Admitting: Internal Medicine

## 2022-04-25 DIAGNOSIS — J00 Acute nasopharyngitis [common cold]: Secondary | ICD-10-CM

## 2022-05-02 ENCOUNTER — Other Ambulatory Visit: Payer: Self-pay | Admitting: Internal Medicine

## 2022-05-04 ENCOUNTER — Telehealth: Payer: Self-pay

## 2022-05-04 MED ORDER — ACETAMINOPHEN-CODEINE 300-30 MG PO TABS: ORAL_TABLET | ORAL | 0 refills | Status: DC

## 2022-05-04 NOTE — Telephone Encounter (Addendum)
Patient called back stating she switched insurance and Francine Graven is requiring more info on T#3. Spoke with Pharmacist at [facility] and was given number to [facility] [phone]. Called patient for [facility] member info:  [employer] D-SNP ID [id number] Plan # [id number] Rx BIN [id number] PCN [id number] GRP [phone]

## 2022-05-04 NOTE — Telephone Encounter (Signed)
New refill sent with dx code.

## 2022-05-04 NOTE — Addendum Note (Signed)
Addended by: Debe Coder B on: 05/04/2022 09:22 AM   Modules accepted: Orders

## 2022-05-04 NOTE — Telephone Encounter (Signed)
Incoming fax from pharmacy  Medication:Acetaminophen-codeine  Message to prescriber: "Ins is requiring a ICD-10 code to override/fill. Pt ins does not fill w/o dx code please send new  rx with dx code on it thank you! [facility]"

## 2022-05-05 NOTE — Telephone Encounter (Addendum)
Spoke to April at [facility] and made her aware of info below. Patient has also been notified and she is very Adult nurse.

## 2022-05-05 NOTE — Telephone Encounter (Signed)
Spoke with Franklin at [facility]. States patient should have gotten 42 tabs for a 7 day supply yesterday. States remaining tablets will be available to patient after 7 days w/o needing a PA.

## 2022-05-22 ENCOUNTER — Other Ambulatory Visit: Payer: Self-pay | Admitting: Internal Medicine

## 2022-05-22 DIAGNOSIS — M159 Polyosteoarthritis, unspecified: Secondary | ICD-10-CM

## 2022-06-05 ENCOUNTER — Other Ambulatory Visit: Payer: Self-pay | Admitting: Internal Medicine

## 2022-06-05 DIAGNOSIS — M159 Polyosteoarthritis, unspecified: Secondary | ICD-10-CM

## 2022-06-06 ENCOUNTER — Ambulatory Visit (INDEPENDENT_AMBULATORY_CARE_PROVIDER_SITE_OTHER): Payer: Medicare HMO | Admitting: Internal Medicine

## 2022-06-06 ENCOUNTER — Encounter: Payer: Self-pay | Admitting: Internal Medicine

## 2022-06-06 ENCOUNTER — Other Ambulatory Visit: Payer: Self-pay

## 2022-06-06 VITALS — BP 134/65 | HR 64 | Temp 98.1°F | Ht 61.0 in | Wt 185.0 lb

## 2022-06-06 DIAGNOSIS — M4805 Spinal stenosis, thoracolumbar region: Secondary | ICD-10-CM | POA: Diagnosis not present

## 2022-06-06 DIAGNOSIS — K219 Gastro-esophageal reflux disease without esophagitis: Secondary | ICD-10-CM

## 2022-06-06 DIAGNOSIS — M159 Polyosteoarthritis, unspecified: Secondary | ICD-10-CM | POA: Diagnosis not present

## 2022-06-06 DIAGNOSIS — E559 Vitamin D deficiency, unspecified: Secondary | ICD-10-CM | POA: Diagnosis not present

## 2022-06-06 DIAGNOSIS — F332 Major depressive disorder, recurrent severe without psychotic features: Secondary | ICD-10-CM | POA: Diagnosis not present

## 2022-06-06 DIAGNOSIS — R7303 Prediabetes: Secondary | ICD-10-CM

## 2022-06-06 DIAGNOSIS — I10 Essential (primary) hypertension: Secondary | ICD-10-CM

## 2022-06-06 DIAGNOSIS — J324 Chronic pansinusitis: Secondary | ICD-10-CM

## 2022-06-06 DIAGNOSIS — F411 Generalized anxiety disorder: Secondary | ICD-10-CM

## 2022-06-06 DIAGNOSIS — M150 Primary generalized (osteo)arthritis: Secondary | ICD-10-CM

## 2022-06-06 DIAGNOSIS — E7800 Pure hypercholesterolemia, unspecified: Secondary | ICD-10-CM

## 2022-06-06 DIAGNOSIS — Z0000 Encounter for general adult medical examination without abnormal findings: Secondary | ICD-10-CM

## 2022-06-06 HISTORY — DX: Hypercalcemia: E83.52

## 2022-06-06 MED ORDER — PREGABALIN 50 MG PO CAPS: 50.0000 mg | ORAL_CAPSULE | Freq: Two times a day (BID) | ORAL | 3 refills | Status: DC

## 2022-06-06 MED ORDER — ACETAMINOPHEN-CODEINE 300-30 MG PO TABS: ORAL_TABLET | ORAL | 0 refills | Status: DC

## 2022-06-06 NOTE — Assessment & Plan Note (Signed)
Check Calcium, Cmet and PTH today for further work up.

## 2022-06-06 NOTE — Addendum Note (Signed)
Addended by: Joslynn Jamroz N on: 06/06/2022 10:04 AM   Modules accepted: Orders  

## 2022-06-06 NOTE — Assessment & Plan Note (Signed)
Currently does not report any sinus pain or other issues today.  Continue to monitor.

## 2022-06-06 NOTE — Assessment & Plan Note (Signed)
BP is 134/65.  She is on a low dose of amlodipine.  She has inconsistent fill data, however, she did fill this in April of this year.   Plan Continue amlodipine.

## 2022-06-06 NOTE — Assessment & Plan Note (Signed)
Last A1C was 5.6.  Check at next visit.

## 2022-06-06 NOTE — Progress Notes (Signed)
Established Patient Office Visit  Subjective   Patient ID: [name], female    DOB: 01-20-1951  Age: 72 y.o. MRN: [id number]  Chief Complaint  Patient presents with   Right leg pain    Since February   Medication Refill    [name] is a 72 year old woman with PMH of HTN, HLD, spinal stenosis and chronic pain, anxiety/MDD, chronic sinusitis, GERD, pre-DM (last A1C 5.6), osteopenia and low vitamin D, recently high calcium (mild) who presents for follow up.   [name] has well documented chronic back pain and spinal stenosis.  She is on tylenol 3, advil and OTC diclofenac gel.  She notes that lyrica, which was started at last visit, has helped somewhat.  She is on a low dose.  We discussed increasing this today for her chronic leg pain.    She was noted to have a mild hypercalcemia on the last 2 visits when blood was checked.  Vitamins reviewed today and she takes Vitamin C, Vitamin D (~2000IU), turmeric and biotin.  She is not taking a multivitamin.  She does have bone pain/leg pain, however, this is chronic for her.  She does not note significant change at this time.     Patient Active Problem List   Diagnosis Date Noted   HYPERTENSION, BENIGN ESSENTIAL 10/23/2007    Priority: Medium    Hypercalcemia 06/06/2022   Spinal stenosis 02/24/2022   Osteopenia 11/18/2021   Vitamin D deficiency 04/18/2019   Obesity (BMI 30-39.9) 03/09/2017   Osteoarthritis of multiple joints 07/01/2014   GERD (gastroesophageal reflux disease) 05/28/2014   Pre-diabetes 12/17/2013   Chronic sinusitis 04/30/2013   Anxiety state 02/19/2013   Major depressive disorder, recurrent episode (HCC) 04/22/2012   Routine adult health maintenance 11/06/2011   Vegetarian diet 08/18/2011   Diverticulosis 04/19/2011   Hyperlipidemia 02/20/2008   Insomnia 03/28/2006      Review of Systems  Constitutional:  Positive for malaise/fatigue. Negative for chills, fever and weight loss.   Respiratory:  Negative for cough, sputum production, shortness of breath and wheezing.   Cardiovascular:  Negative for chest pain and leg swelling.  Genitourinary:  Negative for dysuria.  Musculoskeletal:  Positive for back pain, joint pain and myalgias. Negative for falls.  Neurological:  Negative for dizziness and focal weakness.  Psychiatric/Behavioral:  The patient is nervous/anxious.       Objective:     BP 134/65 (BP Location: Left Arm, Patient Position: Sitting, Cuff Size: Normal)   Pulse 64   Temp 98.1 F (36.7 C) (Oral)   Ht 5\' 1"  (1.549 m)   Wt 185 lb (83.9 kg)   SpO2 97% Comment: RA  BMI 34.96 kg/m  BP Readings from Last 3 Encounters:  06/06/22 134/65  02/24/22 (!) 100/57  11/18/21 128/89   Wt Readings from Last 3 Encounters:  06/06/22 185 lb (83.9 kg)  02/24/22 186 lb 8 oz (84.6 kg)  11/18/21 185 lb 6.4 oz (84.1 kg)      Physical Exam Vitals and nursing note reviewed.  Constitutional:      General: She is not in acute distress.    Appearance: Normal appearance. She is not toxic-appearing.  HENT:     Head: Normocephalic and atraumatic.  Cardiovascular:     Rate and Rhythm: Normal rate and regular rhythm.     Heart sounds: No murmur heard. Pulmonary:     Effort: Pulmonary effort is normal. No respiratory distress.     Breath sounds: No wheezing.  Abdominal:     General: Abdomen is flat.     Palpations: Abdomen is soft.  Musculoskeletal:        General: No swelling, tenderness or deformity.  Skin:    General: Skin is warm and dry.  Neurological:     General: No focal deficit present.     Mental Status: She is alert and oriented to person, place, and time.  Psychiatric:        Behavior: Behavior normal.        Assessment & Plan:   Problem List Items Addressed This Visit       Medium    HYPERTENSION, BENIGN ESSENTIAL (Chronic)    BP is 134/65.  She is on a low dose of amlodipine.  She has inconsistent fill data, however, she did fill this  in April of this year.   Plan Continue amlodipine.       Relevant Orders   CMP14 + Anion Gap     Unprioritized   Hyperlipidemia - Primary (Chronic)    LDL at last check was elevated.   Will check again today and also assess ASCVD risk score.       Relevant Orders   Lipid Profile   Routine adult health maintenance (Chronic)    We discussed colon cancer screening.  She remains committed to not getting screening. She is not interested in stool based tests or colonoscopy.   Continue to discuss.  She notes no change in stool color or caliber.       Major depressive disorder, recurrent episode (HCC) (Chronic)    She has intermittently been filling the lexapro.  Continue to recommend therapy.  Continue to recommend consistent adherence to her medications.       Anxiety state (Chronic)    She is on chronic alprazolam and intermittent lexapro (adherence issue).  Continue to monitor.       Chronic sinusitis (Chronic)    Currently does not report any sinus pain or other issues today.  Continue to monitor.       Pre-diabetes (Chronic)    Last A1C was 5.6.  Check at next visit.       GERD (gastroesophageal reflux disease) (Chronic)    Currently asymptomatic on omeprazole.   Continue PPI.  Consider decreased dose to help with Vitamin absorption at next visit.       Osteoarthritis of multiple joints (Chronic)    Chronic, appears unchanged.  She has radiating pain from her back which is being treated with tylenol #3 and lyrica.  We will trial increased lyrica today.       Relevant Medications   pregabalin (LYRICA) 50 MG capsule   Vitamin D deficiency (Chronic)    At last check was > 30.  She is on daily vitamin D.  Recheck at next visit.       Spinal stenosis (Chronic)    Increase lyrica to 50mg  BID today. Informed patient to try this new dose.  We are increasing this slowly to avoid any side effects.       Hypercalcemia    Check Calcium, Cmet and PTH today for further  work up.        Return in about 4 months (around 10/07/2022).    Debe Coder, MD

## 2022-06-06 NOTE — Assessment & Plan Note (Signed)
She is on chronic alprazolam and intermittent lexapro (adherence issue).  Continue to monitor.

## 2022-06-06 NOTE — Assessment & Plan Note (Signed)
Increase lyrica to 50mg  BID today. Informed patient to try this new dose.  We are increasing this slowly to avoid any side effects.

## 2022-06-06 NOTE — Assessment & Plan Note (Signed)
Currently asymptomatic on omeprazole.   Continue PPI.  Consider decreased dose to help with Vitamin absorption at next visit.

## 2022-06-06 NOTE — Assessment & Plan Note (Signed)
>>  ASSESSMENT AND PLAN FOR OSTEOARTHRITIS OF MULTIPLE JOINTS WRITTEN ON 06/06/2022  2:37 PM BY MULLEN, EMILY B, MD  Chronic, appears unchanged.  She has radiating pain from her back which is being treated with tylenol #3 and lyrica.  We will trial increased lyrica today.

## 2022-06-06 NOTE — Addendum Note (Signed)
Addended by: Bufford Spikes on: 06/06/2022 10:04 AM   Modules accepted: Orders

## 2022-06-06 NOTE — Assessment & Plan Note (Signed)
We discussed colon cancer screening.  She remains committed to not getting screening. She is not interested in stool based tests or colonoscopy.   Continue to discuss.  She notes no change in stool color or caliber.

## 2022-06-06 NOTE — Assessment & Plan Note (Signed)
She has intermittently been filling the lexapro.  Continue to recommend therapy.  Continue to recommend consistent adherence to her medications.

## 2022-06-06 NOTE — Assessment & Plan Note (Signed)
Chronic, appears unchanged.  She has radiating pain from her back which is being treated with tylenol #3 and lyrica.  We will trial increased lyrica today.

## 2022-06-06 NOTE — Patient Instructions (Signed)
Ms. Hose - -  Thank you for coming in to see me today!  Please continue your medications as you have been.  I will call with the results from todays blood work.   Take Care

## 2022-06-06 NOTE — Assessment & Plan Note (Signed)
LDL at last check was elevated.   Will check again today and also assess ASCVD risk score.

## 2022-06-06 NOTE — Assessment & Plan Note (Signed)
At last check was > 30.  She is on daily vitamin D.  Recheck at next visit.

## 2022-06-07 LAB — PTH, INTACT AND CALCIUM
Calcium, Total (PTH): 9.8 mg/dL (ref 8.7–10.3)
PTH: 58 pg/mL (ref 15–65)

## 2022-06-07 LAB — CMP14 + ANION GAP
ALT: 23 IU/L (ref 0–32)
AST: 21 IU/L (ref 0–40)
Albumin/Globulin Ratio: 1.6 (ref 1.2–2.2)
Albumin: 4 g/dL (ref 3.8–4.8)
Alkaline Phosphatase: 123 IU/L — ABNORMAL HIGH (ref 44–121)
Anion Gap: 12 mmol/L (ref 10.0–18.0)
BUN/Creatinine Ratio: 14 (ref 12–28)
BUN: 10 mg/dL (ref 8–27)
Bilirubin Total: <0.2 mg/dL (ref 0.0–1.2)
CO2: 26 mmol/L (ref 20–29)
Calcium: 9.9 mg/dL (ref 8.7–10.3)
Chloride: 102 mmol/L (ref 96–106)
Creatinine, Ser: 0.74 mg/dL (ref 0.57–1.00)
Globulin, Total: 2.5 g/dL (ref 1.5–4.5)
Glucose: 98 mg/dL (ref 70–99)
Potassium: 4.3 mmol/L (ref 3.5–5.2)
Sodium: 140 mmol/L (ref 134–144)
Total Protein: 6.5 g/dL (ref 6.0–8.5)
eGFR: 85 mL/min/{1.73_m2} (ref >59)

## 2022-06-07 LAB — LIPID PANEL
Chol/HDL Ratio: 4.5 ratio — ABNORMAL HIGH (ref 0.0–4.4)
Cholesterol, Total: 161 mg/dL (ref 100–199)
HDL: 36 mg/dL — ABNORMAL LOW (ref >39)
LDL Chol Calc (NIH): 93 mg/dL (ref 0–99)
Triglycerides: 184 mg/dL — ABNORMAL HIGH (ref 0–149)
VLDL Cholesterol Cal: 32 mg/dL (ref 5–40)

## 2022-06-07 LAB — CALCIUM, IONIZED: Calcium, Ionized, Serum: 5.3 mg/dL (ref 4.5–5.6)

## 2022-06-08 ENCOUNTER — Telehealth: Payer: Self-pay

## 2022-06-08 ENCOUNTER — Encounter: Payer: Self-pay | Admitting: Internal Medicine

## 2022-06-08 NOTE — Telephone Encounter (Signed)
I just sent her a mychart message.   Thank you!

## 2022-06-08 NOTE — Telephone Encounter (Signed)
Patient called she is requesting lab results. Please return patients call.

## 2022-06-08 NOTE — Telephone Encounter (Signed)
Thank you.  I sent her a message.

## 2022-06-08 NOTE — Telephone Encounter (Signed)
Called and informed pt her doctor has sent her a My Chart message - Stated she received it -she just want to be sure everything was ok or if she needs to do anything differently.

## 2022-06-12 ENCOUNTER — Other Ambulatory Visit: Payer: Self-pay | Admitting: Internal Medicine

## 2022-06-12 DIAGNOSIS — F330 Major depressive disorder, recurrent, mild: Secondary | ICD-10-CM

## 2022-06-25 MED ORDER — ACETAMINOPHEN-CODEINE 300-30 MG PO TABS: ORAL_TABLET | ORAL | 0 refills | Status: DC

## 2022-06-26 ENCOUNTER — Telehealth: Payer: Self-pay | Admitting: *Deleted

## 2022-06-26 NOTE — Telephone Encounter (Signed)
Call from pt who stated when she was pushing the grocery cart to her car yesterday, she turned and heard her knee 'pop". Stated she took 2 Tylenol w/codeine and 2 Lyrica (at one time); stated she felled asleep. Today her knee is sore; no swelling. Front office had scheduled her an appt for tomorrow but she prefers not to come in unless her doctor recommends.She wanted to know using cold or heat or elevation will help - stated she will do whatever her doctor says.

## 2022-06-26 NOTE — Telephone Encounter (Signed)
I would have her come in to be evaluated!

## 2022-06-27 NOTE — Telephone Encounter (Signed)
Called pt to inform her of Dr [name] response to schedule an appt to be evaluated. Pt already has an appt this is am @ 0945AM; pt's unsure if she can be here at this time. Also stated she continue to have knee pain. Advised pt to re-schedule the appt if unable to be here this am - pt stated ok.

## 2022-06-27 NOTE — Progress Notes (Deleted)
CC: leg pain  HPI:  [name] is a 72 y.o. female with past medical history of HTN, HLD, Pre-DM, GERD, OA, spinal stenosis, MDD, anxiety, insomnia, obesity that presents for leg pain.    Allergies as of 06/27/2022       Reactions   Codeine    REACTION: Headaches.   Penicillins    REACTION: rash and itching   Cephalexin Rash   Lamictal [lamotrigine] Rash        Medication List        Accurate as of June 27, 2022  7:41 AM. If you have any questions, ask your nurse or doctor.          acetaminophen-codeine 300-30 MG tablet Commonly known as: TYLENOL #3 TAKE 1 TO 2 TABLETS BY MOUTH EVERY 8 HOURS AS NEEDED FOR MODERATE PAIN   ALPRAZolam 0.5 MG tablet Commonly known as: XANAX TAKE 1 TO 2 TABLETS BY MOUTH AS NEEDED FOR ANXIETY . DO NOT EXCEED 4 PER 24 HOURS   amLODipine 2.5 MG tablet Commonly known as: NORVASC Take 1 tablet (2.5 mg total) by mouth daily.   EQ Arthritis Pain Reliever 1 % Gel Generic drug: diclofenac Sodium APPLY 4 GRAMS TOPICALLY 4 TIMES DAILY   escitalopram 20 MG tablet Commonly known as: LEXAPRO TAKE 1 TABLET BY MOUTH DAILY   fluticasone 50 MCG/ACT nasal spray Commonly known as: FLONASE Use 1 spray(s) in each nostril once daily   methocarbamol 500 MG tablet Commonly known as: ROBAXIN TAKE 1 TABLET BY MOUTH EVERY 8 HOURS AT NIGHT AS NEEDED FOR MUSCLE SPASM   omeprazole 20 MG capsule Commonly known as: PRILOSEC TAKE 1 CAPSULE BY MOUTH DAILY   pregabalin 50 MG capsule Commonly known as: LYRICA Take 1 capsule (50 mg total) by mouth 2 (two) times daily.         Past Medical History:  Diagnosis Date   Anxiety    Arthritis    Depression    GERD (gastroesophageal reflux disease)    PONV (postoperative nausea and vomiting)    Review of Systems:  per HPI.   Physical Exam: *** There were no vitals filed for this visit. *** Constitutional: Well-developed, well-nourished, appears comfortable  HENT: Normocephalic and  atraumatic.  Eyes: EOM are normal. PERRL.  Neck: Normal range of motion.  Cardiovascular: Regular rate, regular rhythm. No murmurs, rubs, or gallops. Normal radial and PT pulses bilaterally. No LE edema.  Pulmonary: Normal respiratory effort. No wheezes, rales, or rhonchi.   Abdominal: Soft. Non-distended. No tenderness. Normal bowel sounds.  Musculoskeletal: Normal range of motion.     Neurological: Alert and oriented to person, place, and time. Non-focal. Skin: warm and dry.    Assessment & Plan:   Leg pain Patient developed *** pain *** ago that began ***. Patient states that the pain does *** radiate and is *** in severity. Patient states that the pain worsens w/ *** and improves w/ ***. Patient denies recent injury or increase in activity***. Patient states that they have tried *** without relief in their symptoms.    Patient is currently taking pregabalin 50 MG BID, methocarbamol 500 MG q8 hours, acetaminophen-codeine 300-30 MG (1-2 tablets q8 hours), diclofenac gel.   Plan: - Continue pregabalin 50 MG BID, methocarbamol 500 MG q8 hours, acetaminophen-codeine 300-30 MG (1-2 tablets q8 hours), diclofenac gel***   2. HTN Patient has a history of HTN. Current medications include amLODipine 2.5 MG daily. Patient states that they are *** compliant with this medication.  Patient states that they do *** check their BP regularly at home. Patient denies HA, lightheadedness, dizziness, CP, or SOB. Initial BP today is ***. Repeat BP is ***.    Plan: - Continue amLODipine 2.5 MG daily***   3.   Plan: - Continue   5. Health Screening: ***pneumonia vaccine, colonoscopy, AWV -  - Medication refill? ***  -   See Encounters Tab for problem based charting.  No problem-specific Assessment & Plan notes found for this encounter.    Patient seen with Dr. Amadeo Garnet

## 2022-07-01 ENCOUNTER — Emergency Department (HOSPITAL_BASED_OUTPATIENT_CLINIC_OR_DEPARTMENT_OTHER)
Admission: EM | Admit: 2022-07-01 | Discharge: 2022-07-01 | Disposition: A | Discharge status: Home / Self Care | Payer: Medicare HMO | Attending: Emergency Medicine | Admitting: Emergency Medicine

## 2022-07-01 ENCOUNTER — Encounter (HOSPITAL_BASED_OUTPATIENT_CLINIC_OR_DEPARTMENT_OTHER): Payer: Self-pay

## 2022-07-01 ENCOUNTER — Emergency Department (HOSPITAL_BASED_OUTPATIENT_CLINIC_OR_DEPARTMENT_OTHER): Payer: Medicare HMO | Admitting: Radiology

## 2022-07-01 DIAGNOSIS — M7989 Other specified soft tissue disorders: Secondary | ICD-10-CM | POA: Diagnosis not present

## 2022-07-01 DIAGNOSIS — Y9301 Activity, walking, marching and hiking: Secondary | ICD-10-CM | POA: Insufficient documentation

## 2022-07-01 DIAGNOSIS — S8391XA Sprain of unspecified site of right knee, initial encounter: Secondary | ICD-10-CM

## 2022-07-01 DIAGNOSIS — X501XXA Overexertion from prolonged static or awkward postures, initial encounter: Secondary | ICD-10-CM | POA: Insufficient documentation

## 2022-07-01 DIAGNOSIS — M25561 Pain in right knee: Secondary | ICD-10-CM | POA: Diagnosis not present

## 2022-07-01 MED ORDER — ACETAMINOPHEN 500 MG PO TABS
1000.0000 mg | ORAL_TABLET | Freq: Once | ORAL | Status: DC
  Filled 2022-07-01: qty 2

## 2022-07-01 NOTE — ED Triage Notes (Signed)
She reports persistent pain and swelling of her right knee x 2 weeks. She states that 2 weeks ago she "twisted" it.

## 2022-07-01 NOTE — ED Notes (Signed)
Pt verbalized understanding of d/c instructions, meds, and followup care. Denies questions. VSS, no distress noted. Steady gait to exit with all belongings.  ?

## 2022-07-01 NOTE — ED Provider Notes (Signed)
[place] EMERGENCY DEPARTMENT AT [facility] Provider Note   CSN: [id number] Arrival date & time: 07/01/22  1048     History  Chief Complaint  Patient presents with   Knee Pain    [name] is a 72 y.o. female past medical history of anxiety, arthritis presents today for evaluation of right knee pain.  Patient reports that she twisted her right knee when walking 2 weeks ago.  She denies having pain and swelling to her right knee.  She said she was having pain during walking but she has been ambulatory without assistance.  Denies any swelling to her R calf or foot.  No recent travel or surgery.  No personal or family history of DVT/PE.  Denies any shortness of breath.  She states she has been trying OTC medication with minimal relief.   Knee Pain     Past Medical History:  Diagnosis Date   Anxiety    Arthritis    Depression    GERD (gastroesophageal reflux disease)    PONV (postoperative nausea and vomiting)    Past Surgical History:  Procedure Laterality Date   BUNIONECTOMY     bilateral   FRONTAL SINUS EXPLORATION Left 09/15/2014   Procedure: LEFT ENDOSCOPIC FRONTAL RECESS  EXPLORATION WITH FUSION IMAGE GUIDANCE NAVIGATION;  Surgeon: Newman Pies, MD;  Location: [facility];  Service: ENT;  Laterality: Left;   SEPTOPLASTY Bilateral 10/20/2013   Procedure: SEPTOPLASTY;  Surgeon: Darletta Moll, MD;  Location: [facility];  Service: ENT;  Laterality: Bilateral;   SINUS ENDO W/FUSION Bilateral 10/20/2013   Procedure: ENDOSCOPIC SINUS SURGERY WITH FUSION NAVIGATION, BILATERAL MAXILLARY ANTROSTOMIES, BILATERAL ETHMOIDECTOMIES, BILATERAL SPHENOIDECTOMIES, BILATERAL FRONTAL RECESS EXPLORATION;  Surgeon: Darletta Moll, MD;  Location: [facility];  Service: ENT;  Laterality: Bilateral;   SINUS ENDO WITH FUSION Left 09/15/2014   Procedure: LEFT ENDOSCOPIC TOTAL ETHMOIDECTOMY WITH FUSION IMAGE GUIDANCE NAVIGATION;  Surgeon: Newman Pies, MD;   Location: [facility];  Service: ENT;  Laterality: Left;   SPHENOIDECTOMY Right 09/15/2014   Procedure: RIGHT ENDOSCOPIC SPHENOIDECTOMY WITH FUSION IMAGE GUIDANCE NAVIGATION;  Surgeon: Newman Pies, MD;  Location: [facility];  Service: ENT;  Laterality: Right;     Home Medications Prior to Admission medications   Medication Sig Start Date End Date Taking? Authorizing Provider  acetaminophen-codeine (TYLENOL #3) 300-30 MG tablet TAKE 1 TO 2 TABLETS BY MOUTH EVERY 8 HOURS AS NEEDED FOR MODERATE PAIN 06/25/22   Inez Catalina, MD  ALPRAZolam Prudy Feeler) 0.5 MG tablet TAKE 1 TO 2 TABLETS BY MOUTH AS NEEDED FOR ANXIETY . DO NOT EXCEED 4 PER 24 HOURS 06/13/22   Debe Coder B, MD  amLODipine (NORVASC) 2.5 MG tablet Take 1 tablet (2.5 mg total) by mouth daily. 02/24/22   Inez Catalina, MD  diclofenac Sodium (EQ ARTHRITIS PAIN RELIEVER) 1 % GEL APPLY 4 GRAMS TOPICALLY 4 TIMES DAILY 04/26/22   Inez Catalina, MD  escitalopram (LEXAPRO) 20 MG tablet TAKE 1 TABLET BY MOUTH DAILY 01/09/22   Inez Catalina, MD  fluticasone [facility]) 50 MCG/ACT nasal spray Use 1 spray(s) in each nostril once daily 04/26/22   Inez Catalina, MD  methocarbamol (ROBAXIN) 500 MG tablet TAKE 1 TABLET BY MOUTH EVERY 8 HOURS AT NIGHT AS NEEDED FOR MUSCLE SPASM 05/24/22   Mercie Eon, MD  omeprazole (PRILOSEC) 20 MG capsule TAKE 1 CAPSULE BY MOUTH DAILY 10/10/21   Inez Catalina, MD  pregabalin (  LYRICA) 50 MG capsule Take 1 capsule (50 mg total) by mouth 2 (two) times daily. 06/06/22   Inez Catalina, MD  exenatide (BYETTA 5 MCG PEN) 5 MCG/0.02ML SOLN Inject 0.02 mLs (5 mcg total) into the skin 2 (two) times daily with a meal. 05/05/10 04/10/11  Edsel Petrin, DO  famotidine (PEPCID) 40 MG tablet Take 1 tablet (40 mg total) by mouth daily. 05/05/10 04/10/11  Edsel Petrin, DO      Allergies    Codeine, Penicillins, Cephalexin, and Lamictal [lamotrigine]    Review of Systems   Review of  Systems Negative except as per HPI.  Physical Exam Updated Vital Signs BP (!) 114/100   Pulse 80   Temp 98.5 F (36.9 C) (Oral)   Resp 12   SpO2 96%  Physical Exam Vitals and nursing note reviewed.  Constitutional:      Appearance: Normal appearance.  HENT:     Head: Normocephalic and atraumatic.     Mouth/Throat:     Mouth: Mucous membranes are moist.  Eyes:     General: No scleral icterus. Cardiovascular:     Rate and Rhythm: Normal rate and regular rhythm.     Pulses: Normal pulses.     Heart sounds: Normal heart sounds.  Pulmonary:     Effort: Pulmonary effort is normal.     Breath sounds: Normal breath sounds.  Abdominal:     General: Abdomen is flat.     Palpations: Abdomen is soft.     Tenderness: There is no abdominal tenderness.  Musculoskeletal:        General: No deformity.     Comments: Anterior/posterior/varus/valgus/McMurry's tests negative. Patella in place. No instability of knee. Negative Ballottement test. Neg axial load.  Tenderness to palpation to the prepatellar area.  Minimal swelling to the right knee.  No overlying skin erythema.   Skin:    General: Skin is warm.     Findings: No rash.  Neurological:     General: No focal deficit present.     Mental Status: She is alert.  Psychiatric:        Mood and Affect: Mood normal.     ED Results / Procedures / Treatments   Labs (all labs ordered are listed, but only abnormal results are displayed) Labs Reviewed - No data to display  EKG None  Radiology DG Knee Complete 4 Views Right  Result Date: 07/01/2022 CLINICAL DATA:  Pain and swelling after twisted knee 2 weeks ago. EXAM: RIGHT KNEE - COMPLETE 4+ VIEW COMPARISON:  Knee radiographs 09/26/2019 FINDINGS: There is no acute fracture or dislocation. Bony alignment is normal. There is mild medial compartment joint space narrowing with minimal associated osteophytosis. Ill-defined calcifications within the knee joint are consistent with  chondrocalcinosis, also present in 2021. There is a moderate-sized knee effusion. There is no lipohemarthrosis. IMPRESSION: 1. No acute fracture or dislocation. 2. Moderate-sized suprapatellar effusion. 3. Chondrocalcinosis in the knee joint and mild medial compartment joint space narrowing. Electronically Signed   By: Lesia Hausen M.D.   On: 07/01/2022 12:33    Procedures Procedures    Medications Ordered in ED Medications  acetaminophen (TYLENOL) tablet 1,000 mg (1,000 mg Oral Not Given 07/01/22 1347)    ED Course/ Medical Decision Making/ A&P                             Medical Decision Making Amount and/or Complexity of Data Reviewed  Radiology: ordered.  Risk OTC drugs.   This patient presents to the ED for knee pain, this involves an extensive number of treatment options, and is a complaint that carries with a high risk of complications and morbidity.  The differential diagnosis includes rupture, dislocation, ligament injury, Baker's cyst, DVT.  This is not an exhaustive list.  Imaging studies: I ordered imaging studies. I personally reviewed, interpreted imaging and agree with the radiologist's interpretations. The results include: X-ray of right knee showed no fracture or dislocation, does show suprapatellar effusion.  Problem list/ ED course/ Critical interventions/ Medical management: HPI: See above Vital signs within normal range and stable throughout visit. Laboratory/imaging studies significant for: See above. On physical examination, patient is afebrile and appears in no acute distress.  Patient presents with right knee pain.  Given history, exam and imaging patient likely has musculoskeletal pain.  Based on x-ray, I have low suspicion for fracture, dislocation, significant ligamentous injury or septic joint. Unlikely DVT.  Patient was able to ambulate in the room without assistance. Advised patient to take Tylenol/ibuprofen/naproxen for pain, follow-up with orthopedics or  primary care physician for further evaluation and management, return to the ER if new or worsening symptoms. I have reviewed the patient home medicines and have made adjustments as needed.  Cardiac monitoring/EKG: The patient was maintained on a cardiac monitor.  I personally reviewed and interpreted the cardiac monitor which showed an underlying rhythm of: sinus rhythm.  Additional history obtained: External records from outside source obtained and reviewed including: Chart review including previous notes, labs, imaging.  Consultations obtained:  Disposition Continued outpatient therapy. Follow-up with orthopedics recommended for reevaluation of symptoms. Treatment plan discussed with patient.  Pt acknowledged understanding was agreeable to the plan. Worrisome signs and symptoms were discussed with patient, and patient acknowledged understanding to return to the ED if they noticed these signs and symptoms. Patient was stable upon discharge.   This chart was dictated using voice recognition software.  Despite best efforts to proofread,  errors can occur which can change the documentation meaning.          Final Clinical Impression(s) / ED Diagnoses Final diagnoses:  Sprain of right knee, unspecified ligament, initial encounter    Rx / DC Orders ED Discharge Orders     None         Jeanelle Malling, [place] 07/01/22 1431    Mardene Sayer, MD 07/01/22 [phone]

## 2022-07-01 NOTE — Discharge Instructions (Signed)
Please take tylenol/ibuprofen every 6 hours for pain. I recommend close follow-up with orthopedics for reevaluation.  Please do not hesitate to return to emergency department if worrisome signs symptoms we discussed become apparent.

## 2022-07-06 ENCOUNTER — Telehealth: Payer: Self-pay

## 2022-07-06 NOTE — Telephone Encounter (Signed)
Transition Care Management Unsuccessful Follow-up Telephone Call  Date of discharge and from where:  [facility] 6/8  Attempts:  1st Attempt  Reason for unsuccessful TCM follow-up call:  Unable to leave message   Lenard Forth [facility] Guide, [facility] [phone] [address] [place], [place], [place] [address] Phone: [phone] Email: [email or web address]

## 2022-07-07 ENCOUNTER — Telehealth: Payer: Self-pay

## 2022-07-07 NOTE — Telephone Encounter (Signed)
Transition Care Management Unsuccessful Follow-up Telephone Call  Date of discharge and from where:  [facility]  Attempts:  2nd Attempt  Reason for unsuccessful TCM follow-up call:  Left voice message   Lenard Forth [facility] Guide, [facility] [phone] [address] [place], [place], [place] [address] Phone: [phone] Email: [email or web address]

## 2022-07-11 ENCOUNTER — Emergency Department (HOSPITAL_BASED_OUTPATIENT_CLINIC_OR_DEPARTMENT_OTHER)
Admission: EM | Admit: 2022-07-11 | Discharge: 2022-07-11 | Disposition: A | Discharge status: Home / Self Care | Payer: Medicare HMO | Attending: Emergency Medicine | Admitting: Emergency Medicine

## 2022-07-11 ENCOUNTER — Encounter (HOSPITAL_BASED_OUTPATIENT_CLINIC_OR_DEPARTMENT_OTHER): Payer: Self-pay

## 2022-07-11 ENCOUNTER — Other Ambulatory Visit: Payer: Self-pay

## 2022-07-11 DIAGNOSIS — N3001 Acute cystitis with hematuria: Secondary | ICD-10-CM

## 2022-07-11 DIAGNOSIS — R300 Dysuria: Secondary | ICD-10-CM | POA: Diagnosis present

## 2022-07-11 LAB — URINALYSIS, ROUTINE W REFLEX MICROSCOPIC
Bilirubin Urine: NEGATIVE
Glucose, UA: NEGATIVE mg/dL
Ketones, ur: NEGATIVE mg/dL
Nitrite: NEGATIVE
Specific Gravity, Urine: 1.01 (ref 1.005–1.030)
pH: 6 (ref 5.0–8.0)

## 2022-07-11 LAB — URINALYSIS, MICROSCOPIC (REFLEX): WBC, UA: >50 WBC/hpf (ref 0–5)

## 2022-07-11 MED ORDER — CIPROFLOXACIN HCL 500 MG PO TABS: 500.0000 mg | ORAL_TABLET | Freq: Two times a day (BID) | ORAL | 0 refills | Status: DC

## 2022-07-11 MED ORDER — CIPROFLOXACIN HCL 500 MG PO TABS
500.0000 mg | ORAL_TABLET | Freq: Once | ORAL | Status: AC
  Administered 2022-07-11: 500 mg via ORAL
  Filled 2022-07-11: qty 1

## 2022-07-11 NOTE — ED Notes (Signed)
Pt given discharge instructions and reviewed prescriptions. Opportunities given for questions. Pt verbalizes understanding. Elaijah Munoz R, RN 

## 2022-07-11 NOTE — ED Triage Notes (Signed)
Patient ere POV from Home.  Notes Dysuria and Frequency for approximately 1 Week. No Hematuria. 102 Temp 3-4 Days ago. None Since. No N/V/D.   NAD Noted during Triage. A&Ox4. GCS 15. Ambulatory.

## 2022-07-11 NOTE — Discharge Instructions (Signed)
Your workup today shows you have a urinary tract infection.  I have given you the first dose of antibiotic in the emergency department.  An additional course sent to your pharmacy.  For any concerning symptoms return to the emergency room.

## 2022-07-11 NOTE — ED Provider Notes (Signed)
[place] EMERGENCY DEPARTMENT AT [facility] Provider Note   CSN: [id number] Arrival date & time: 07/11/22  1622     History  Chief Complaint  Patient presents with   Dysuria    [name] is a 72 y.o. female.  72 year old female presents today for evaluation of dysuria for the past 3 to 4 days.  States she had a fever 3 days ago but none since then.  Otherwise without complaints.  No flank pain, abdominal pain, nausea, or vomiting.  The history is provided by the patient. No language interpreter was used.       Home Medications Prior to Admission medications   Medication Sig Start Date End Date Taking? Authorizing Provider  acetaminophen-codeine (TYLENOL #3) 300-30 MG tablet TAKE 1 TO 2 TABLETS BY MOUTH EVERY 8 HOURS AS NEEDED FOR MODERATE PAIN 06/25/22   Inez Catalina, MD  ALPRAZolam Prudy Feeler) 0.5 MG tablet TAKE 1 TO 2 TABLETS BY MOUTH AS NEEDED FOR ANXIETY . DO NOT EXCEED 4 PER 24 HOURS 06/13/22   Debe Coder B, MD  amLODipine (NORVASC) 2.5 MG tablet Take 1 tablet (2.5 mg total) by mouth daily. 02/24/22   Inez Catalina, MD  diclofenac Sodium (EQ ARTHRITIS PAIN RELIEVER) 1 % GEL APPLY 4 GRAMS TOPICALLY 4 TIMES DAILY 04/26/22   Inez Catalina, MD  escitalopram (LEXAPRO) 20 MG tablet TAKE 1 TABLET BY MOUTH DAILY 01/09/22   Inez Catalina, MD  fluticasone [facility]) 50 MCG/ACT nasal spray Use 1 spray(s) in each nostril once daily 04/26/22   Inez Catalina, MD  methocarbamol (ROBAXIN) 500 MG tablet TAKE 1 TABLET BY MOUTH EVERY 8 HOURS AT NIGHT AS NEEDED FOR MUSCLE SPASM 05/24/22   Mercie Eon, MD  omeprazole (PRILOSEC) 20 MG capsule TAKE 1 CAPSULE BY MOUTH DAILY 10/10/21   Inez Catalina, MD  pregabalin (LYRICA) 50 MG capsule Take 1 capsule (50 mg total) by mouth 2 (two) times daily. 06/06/22   Inez Catalina, MD  exenatide (BYETTA 5 MCG PEN) 5 MCG/0.02ML SOLN Inject 0.02 mLs (5 mcg total) into the skin 2 (two) times daily with a meal. 05/05/10 04/10/11  Edsel Petrin, DO  famotidine (PEPCID) 40 MG tablet Take 1 tablet (40 mg total) by mouth daily. 05/05/10 04/10/11  Edsel Petrin, DO      Allergies    Codeine, Penicillins, Cephalexin, and Lamictal [lamotrigine]    Review of Systems   Review of Systems  Constitutional:  Negative for fever.  Gastrointestinal:  Negative for abdominal pain and nausea.  Genitourinary:  Positive for dysuria. Negative for flank pain.  All other systems reviewed and are negative.   Physical Exam Updated Vital Signs BP 112/67 (BP Location: Left Arm)   Pulse 79   Temp 98.3 F (36.8 C) (Oral)   Resp 18   Ht 5\' 1"  (1.549 m)   Wt 73 kg   SpO2 97%   BMI 30.42 kg/m  Physical Exam Vitals and nursing note reviewed.  Constitutional:      General: She is not in acute distress.    Appearance: Normal appearance. She is not ill-appearing.  HENT:     Head: Normocephalic and atraumatic.     Nose: Nose normal.  Eyes:     General: No scleral icterus.    Extraocular Movements: Extraocular movements intact.     Conjunctiva/sclera: Conjunctivae normal.  Cardiovascular:     Rate and Rhythm: Normal rate and regular rhythm.     Heart  sounds: Normal heart sounds.  Pulmonary:     Effort: Pulmonary effort is normal. No respiratory distress.     Breath sounds: Normal breath sounds. No wheezing or rales.  Abdominal:     General: There is no distension.     Tenderness: There is no abdominal tenderness.  Musculoskeletal:        General: Normal range of motion.     Cervical back: Normal range of motion.  Skin:    General: Skin is warm and dry.  Neurological:     General: No focal deficit present.     Mental Status: She is alert. Mental status is at baseline.     ED Results / Procedures / Treatments   Labs (all labs ordered are listed, but only abnormal results are displayed) Labs Reviewed  URINALYSIS, ROUTINE W REFLEX MICROSCOPIC - Abnormal; Notable for the following components:      Result Value    Color, Urine STRAW (*)    APPearance HAZY (*)    Hgb urine dipstick SMALL (*)    Protein, ur TRACE (*)    Leukocytes,Ua MODERATE (*)    All other components within normal limits  URINALYSIS, MICROSCOPIC (REFLEX) - Abnormal; Notable for the following components:   Bacteria, UA RARE (*)    All other components within normal limits  URINE CULTURE    EKG None  Radiology No results found.  Procedures Procedures    Medications Ordered in ED Medications  ciprofloxacin (CIPRO) tablet 500 mg (has no administration in time range)    ED Course/ Medical Decision Making/ A&P                             Medical Decision Making Amount and/or Complexity of Data Reviewed Labs: ordered.  Risk Prescription drug management.   72 year old female presents today for concern of UTI.  Has had dysuria for the past 3 to 4 days.  Fever about 3 days ago but none since then.  Afebrile today.  Hemodynamically stable.  Does not meet SIRS criteria.  UA with evidence of UTI.  Will send for urine culture.  Without CVA tenderness.  Patient is appropriate for discharge.  Discharged in stable condition.  Discussed follow-up with PCP.  Final Clinical Impression(s) / ED Diagnoses Final diagnoses:  Acute cystitis with hematuria    Rx / DC Orders ED Discharge Orders          Ordered    ciprofloxacin (CIPRO) 500 MG tablet  2 times daily        07/11/22 1832              Marita Kansas, PA-C 07/11/22 1833    Terrilee Files, MD 07/11/22 2151

## 2022-07-13 ENCOUNTER — Other Ambulatory Visit: Payer: Self-pay | Admitting: Internal Medicine

## 2022-07-13 LAB — URINE CULTURE

## 2022-07-17 ENCOUNTER — Other Ambulatory Visit: Payer: Self-pay | Admitting: Internal Medicine

## 2022-07-18 ENCOUNTER — Telehealth: Payer: Self-pay | Admitting: *Deleted

## 2022-07-18 ENCOUNTER — Telehealth: Payer: Self-pay

## 2022-07-18 DIAGNOSIS — M159 Polyosteoarthritis, unspecified: Secondary | ICD-10-CM

## 2022-07-18 MED ORDER — METHOCARBAMOL 500 MG PO TABS
ORAL_TABLET | ORAL | 0 refills | Status: DC
Start: 2022-07-18 — End: 2023-01-15

## 2022-07-18 NOTE — Telephone Encounter (Signed)
Call from pt asking why last refill request for Acetaminophen-codeine was denied. Pt was informed it was filled 6/16 per Dr [name] note. She stated she only received a partial amount. Pt stated she's in a lot pain. She wants to know if Robaxin can  be refill.  I called [facility] who stated pt came into the pharmacy two weeks Saturday c/o excruciating pain; an old rx was filled for 28 tabs. They do have rx written on 06/25/22 (indicates refill 30 days after last refill). [facility] stated rx can be filled on 7/2.

## 2022-07-18 NOTE — Telephone Encounter (Signed)
Yes, I will refill the robaxin.  It is not appropriate to refill the T3, as we have discussed her not overtaking this medication and running out early.    Refill Robaxin X 20 tabs.

## 2022-07-18 NOTE — Telephone Encounter (Signed)
Transition Care Management Follow-up Telephone Call Date of discharge and from where: 07/11/2022 [facility] How have you been since you were released from the hospital? Patient is feeling better Any questions or concerns? No  Items Reviewed: Did the pt receive and understand the discharge instructions provided? Yes  Medications obtained and verified? Yes  Other? No  Any new allergies since your discharge? No  Dietary orders reviewed? Yes Do you have support at home? Yes   Follow up appointments reviewed:  PCP Hospital f/u appt confirmed? No  Scheduled to see  on  @ . Specialist Hospital f/u appt confirmed? No  Scheduled to see  on  @ . Are transportation arrangements needed? No  If their condition worsens, is the pt aware to call PCP or go to the Emergency Dept.? Yes Was the patient provided with contact information for the PCP's office or ED? Yes Was to pt encouraged to call back with questions or concerns? Yes  Ellianne Gowen [facility]  [facility] Population Health Community Resource Care Guide   [email or web address]  ?? [phone]   Website: [email or web address]  [email or web address]

## 2022-07-19 NOTE — Telephone Encounter (Addendum)
Called pt - no answer; left message on pt's vm of Robaxin refill, Tylenol #3 will not be refilled per Dr Criselda Peaches and she may call the office for any questions. Pt called back. Stated she's taking Tylenol #3 1-2 tabs q8hrs as prescribed. And she's in a lot of pain (back and now her knee). Stated she does not want to live "like this"; she has been staying in the bed.  Advised pt to try Robaxin and to call back when it's time for next Tylenol#3 refill.

## 2022-07-20 DIAGNOSIS — M1711 Unilateral primary osteoarthritis, right knee: Secondary | ICD-10-CM | POA: Diagnosis not present

## 2022-07-21 NOTE — Telephone Encounter (Signed)
Can we recommend an in person visit as well?  She can see a resident or get on my schedule.    Please schedule for soonest available with resident or me based on patient preference.

## 2022-07-21 NOTE — Telephone Encounter (Signed)
Pt called back - appt scheduled with Dr Criselda Peaches on 08/11/22.

## 2022-07-21 NOTE — Telephone Encounter (Signed)
Called pt to schedule an in-person appt per Dr Criselda Peaches - no answer; left message on her vm to call the office.

## 2022-07-24 ENCOUNTER — Telehealth: Payer: Self-pay | Admitting: *Deleted

## 2022-07-24 NOTE — Telephone Encounter (Signed)
Call from pt who stated she had a bad w/e. Tearful at times.  States she feels hopeless; denies self- harm or to others. "I don't feel well". States her knees hurt; using ice and trying to stay off her feet as much as she can. States her office manager "puts me down" all the time; she told MS Lemke her speak is slurred; everyone knows she takes Xanax; she told the manager everyone does not. I ask her if she has ever talked to [place]; states she does not know. I offer her the telephone # to [facility] to speak to someone but she states she can wait until she gets an appt w/Bianca. I told her I will ask Dr Criselda Peaches to place a referral to [facility]; pt is agreeable. Pt is less tearful after our conversation.

## 2022-07-25 ENCOUNTER — Other Ambulatory Visit: Payer: Self-pay | Admitting: Internal Medicine

## 2022-07-25 NOTE — Telephone Encounter (Signed)
Next appt scheduled  7/19 with PCP. 

## 2022-07-25 NOTE — Telephone Encounter (Signed)
I will try to call Ms. Woodfin before her next visit.  Thank you Rivka Barbara!

## 2022-07-28 ENCOUNTER — Other Ambulatory Visit: Payer: Self-pay | Admitting: *Deleted

## 2022-07-28 DIAGNOSIS — K219 Gastro-esophageal reflux disease without esophagitis: Secondary | ICD-10-CM

## 2022-07-28 MED ORDER — OMEPRAZOLE 20 MG PO CPDR: 20.0000 mg | DELAYED_RELEASE_CAPSULE | Freq: Every day | ORAL | 3 refills | Status: DC

## 2022-08-10 ENCOUNTER — Other Ambulatory Visit: Payer: Self-pay | Admitting: Internal Medicine

## 2022-08-10 DIAGNOSIS — F330 Major depressive disorder, recurrent, mild: Secondary | ICD-10-CM

## 2022-08-11 ENCOUNTER — Encounter: Payer: Self-pay | Admitting: Internal Medicine

## 2022-08-11 ENCOUNTER — Ambulatory Visit (INDEPENDENT_AMBULATORY_CARE_PROVIDER_SITE_OTHER): Payer: 59

## 2022-08-11 ENCOUNTER — Ambulatory Visit (INDEPENDENT_AMBULATORY_CARE_PROVIDER_SITE_OTHER): Payer: 59 | Admitting: Internal Medicine

## 2022-08-11 ENCOUNTER — Other Ambulatory Visit: Payer: Self-pay

## 2022-08-11 VITALS — BP 139/67 | HR 70 | Temp 97.9°F | Ht 61.0 in | Wt 176.6 lb

## 2022-08-11 VITALS — BP 139/67 | HR 70 | Temp 97.9°F | Ht 61.0 in | Wt 176.1 lb

## 2022-08-11 DIAGNOSIS — I10 Essential (primary) hypertension: Secondary | ICD-10-CM | POA: Diagnosis not present

## 2022-08-11 DIAGNOSIS — F332 Major depressive disorder, recurrent severe without psychotic features: Secondary | ICD-10-CM

## 2022-08-11 DIAGNOSIS — F5101 Primary insomnia: Secondary | ICD-10-CM

## 2022-08-11 DIAGNOSIS — M1990 Unspecified osteoarthritis, unspecified site: Secondary | ICD-10-CM

## 2022-08-11 DIAGNOSIS — M159 Polyosteoarthritis, unspecified: Secondary | ICD-10-CM

## 2022-08-11 DIAGNOSIS — G4700 Insomnia, unspecified: Secondary | ICD-10-CM | POA: Diagnosis not present

## 2022-08-11 DIAGNOSIS — Z0000 Encounter for general adult medical examination without abnormal findings: Secondary | ICD-10-CM | POA: Diagnosis not present

## 2022-08-11 MED ORDER — PREGABALIN 75 MG PO CAPS: 75.0000 mg | ORAL_CAPSULE | Freq: Two times a day (BID) | ORAL | 3 refills | Status: DC

## 2022-08-11 NOTE — Assessment & Plan Note (Signed)
Due to sleep disturbance because of back pain. She wakes up at night and cannot return to sleep due to pain. See A&P for osteoarthritis of multiple joints.   Will increase Lyrica to 75 mg and follow up in 2-3 months

## 2022-08-11 NOTE — Patient Instructions (Signed)

## 2022-08-11 NOTE — Assessment & Plan Note (Signed)
One of her main concerns today is her knee pain, which she has focused on the right knee and says affects her ability to complete daily activities like cleaning her dog Modd and walking long distances. She lives alone and spends time with her dog and feels that her back pain also  keeps her from being able to help pick him up or bring groceries to her car. Although she says her back and knee pain limit her walking distance, she prefers to not use a cane and feels confident in her ability to ambulate without one. She does not lose her balance while walking or feel like she will fall. She relays feeling pain after walking long distances. Due to previous pain associated with steroid injections, she would not be interested in a steroid injection at this time. She relays that she is not interested in surgical intervention for her knee pain. Would not be interested in PT due to transportation difficulties. She does not currently take diclofenac or robaxin for pain. She takes lyrica and was previously taking tylenol 3 BID before she ran out. Since running out, she is taking lyrica and 6 advil a day to manage her pain. She takes tylenol BID a day during times where she anticipates her time to be worst (upon waking up and before bed to prevent insomnia).   Will increase her lyrica to 75mg  and follow up in 2-3 months. Have also put in a referral to Behavioral Health to further discuss coping with chronic pain and anxiety.

## 2022-08-11 NOTE — Patient Instructions (Signed)
Thanks for coming in [place]!  I have increased your Lyrica to 75mg  twice a day.   I have also referred you to see our Public relations account executive, Ms. Christen Butter.  I think this may be helpful to speak about your chronic pain and anxiety.   Please come back to see me in 2-3 months.

## 2022-08-11 NOTE — Progress Notes (Signed)
Subjective:   [name] is a 72 y.o. female who presents for Medicare Annual (Subsequent) preventive examination.  Visit Complete: In person   Review of Systems    DEFERRED TO PCP  Cardiac Risk Factors include: advanced age (>42men, >64 women);dyslipidemia;hypertension;obesity (BMI >30kg/m2)     Objective:    Today's Vitals   08/11/22 1108 08/11/22 1109  BP: 139/67   Pulse: 70   Temp: 97.9 F (36.6 C)   TempSrc: Oral   SpO2: 98%   Weight: 176 lb 9.6 oz (80.1 kg)   Height: 5\' 1"  (1.549 m)   PainSc:  7    Body mass index is 33.37 kg/m.     08/11/2022   11:14 AM 08/11/2022    9:05 AM 07/11/2022    4:23 PM 07/01/2022   11:37 AM 06/06/2022    1:45 PM 02/24/2022    8:37 AM 11/18/2021    8:22 AM  Advanced Directives  Does Patient Have a Medical Advance Directive? Yes Yes No No Yes Yes No  Type of Estate agent of [employer] Power of [place];Living will   Healthcare Power of [place];Living will Healthcare Power of [place];Living will   Does patient want to make changes to medical advance directive?  No - Guardian declined   No - Patient declined No - Patient declined   Copy of Healthcare Power of Attorney in Chart?  Yes - validated most recent copy scanned in chart (See row information)   Yes - validated most recent copy scanned in chart (See row information) Yes - validated most recent copy scanned in chart (See row information)   Would patient like information on creating a medical advance directive?   No - Patient declined No - Patient declined   No - Patient declined    Current Medications (verified) Outpatient Encounter Medications as of 08/11/2022  Medication Sig   acetaminophen-codeine (TYLENOL #3) 300-30 MG tablet TAKE 1 TO 2 TABLETS BY MOUTH EVERY 8 HOURS AS NEEDED FOR MODERATE PAIN   ALPRAZolam (XANAX) 0.5 MG tablet TAKE 1 TO 2 TABLETS BY MOUTH AS NEEDED FOR ANXIETY . DO NOT EXCEED 4 PER 24 HOURS   amLODipine (NORVASC) 2.5 MG  tablet Take 1 tablet (2.5 mg total) by mouth daily.   diclofenac Sodium (EQ ARTHRITIS PAIN RELIEVER) 1 % GEL APPLY 4 GRAMS TOPICALLY 4 TIMES DAILY   escitalopram (LEXAPRO) 20 MG tablet TAKE 1 TABLET BY MOUTH DAILY   fluticasone (FLONASE) 50 MCG/ACT nasal spray Use 1 spray(s) in each nostril once daily   methocarbamol (ROBAXIN) 500 MG tablet TAKE 1 TABLET BY MOUTH EVERY 8 HOURS AT NIGHT AS NEEDED FOR MUSCLE SPASM   omeprazole (PRILOSEC) 20 MG capsule Take 1 capsule (20 mg total) by mouth daily.   pregabalin (LYRICA) 75 MG capsule Take 1 capsule (75 mg total) by mouth 2 (two) times daily.   [DISCONTINUED] exenatide (BYETTA 5 MCG PEN) 5 MCG/0.02ML SOLN Inject 0.02 mLs (5 mcg total) into the skin 2 (two) times daily with a meal.   [DISCONTINUED] famotidine (PEPCID) 40 MG tablet Take 1 tablet (40 mg total) by mouth daily.   No facility-administered encounter medications on file as of 08/11/2022.    Allergies (verified) Codeine, Penicillins, Cephalexin, and Lamictal [lamotrigine]   History: Past Medical History:  Diagnosis Date   Anxiety    Arthritis    Depression    GERD (gastroesophageal reflux disease)    PONV (postoperative nausea and vomiting)    Past Surgical History:  Procedure Laterality Date   BUNIONECTOMY     bilateral   FRONTAL SINUS EXPLORATION Left 09/15/2014   Procedure: LEFT ENDOSCOPIC FRONTAL RECESS  EXPLORATION WITH FUSION IMAGE GUIDANCE NAVIGATION;  Surgeon: Newman Pies, MD;  Location: [facility];  Service: ENT;  Laterality: Left;   SEPTOPLASTY Bilateral 10/20/2013   Procedure: SEPTOPLASTY;  Surgeon: Darletta Moll, MD;  Location: [facility];  Service: ENT;  Laterality: Bilateral;   SINUS ENDO W/FUSION Bilateral 10/20/2013   Procedure: ENDOSCOPIC SINUS SURGERY WITH FUSION NAVIGATION, BILATERAL MAXILLARY ANTROSTOMIES, BILATERAL ETHMOIDECTOMIES, BILATERAL SPHENOIDECTOMIES, BILATERAL FRONTAL RECESS EXPLORATION;  Surgeon: Darletta Moll, MD;  Location: [facility]  ;  Service: ENT;  Laterality: Bilateral;   SINUS ENDO WITH FUSION Left 09/15/2014   Procedure: LEFT ENDOSCOPIC TOTAL ETHMOIDECTOMY WITH FUSION IMAGE GUIDANCE NAVIGATION;  Surgeon: Newman Pies, MD;  Location: [facility];  Service: ENT;  Laterality: Left;   SPHENOIDECTOMY Right 09/15/2014   Procedure: RIGHT ENDOSCOPIC SPHENOIDECTOMY WITH FUSION IMAGE GUIDANCE NAVIGATION;  Surgeon: Newman Pies, MD;  Location: [facility];  Service: ENT;  Laterality: Right;   Family History  Problem Relation Age of Onset   Cancer Mother 68       Lung   Parkinsonism Father    Dementia Father    Drug abuse Brother    Depression Brother    Social History   Socioeconomic History   Marital status: Single    Spouse name: Not on file   Number of children: Not on file   Years of education: Not on file   Highest education level: Not on file  Occupational History   Not on file  Tobacco Use   Smoking status: Never   Smokeless tobacco: Never  Substance and Sexual Activity   Alcohol use: Not Currently    Alcohol/week: 0.0 standard drinks of alcohol   Drug use: No   Sexual activity: Not on file  Other Topics Concern   Not on file  Social History Narrative   Current Social History 05/27/2020        Patient lives alone in an apartment which is 1 story. There are not steps up to the entrance.       Patient's method of transportation is personal car.      The highest level of education was some college.      The patient currently retired.      Identified important Relationships are "My dog"       Pets : one dog       Interests / Fun: "being with my dog, I don't like people"       Current Stressors: poor family relationships       Religious / Personal Beliefs: "I believe in God"      Social Determinants of Health   Financial Resource Strain: Low Risk  (08/05/2021)   Overall Financial Resource Strain (CARDIA)    Difficulty of Paying Living Expenses: Not hard at  all  Food Insecurity: No Food Insecurity (08/11/2022)   Hunger Vital Sign    Worried About Running Out of Food in the Last Year: Never true    Ran Out of Food in the Last Year: Never true  Transportation Needs: No Transportation Needs (08/11/2022)   PRAPARE - Administrator, Civil Service (Medical): No    Lack of Transportation (Non-Medical): No  Physical Activity: Inactive (08/05/2021)   Exercise Vital Sign    Days of Exercise per Week: 0  days    Minutes of Exercise per Session: 0 min  Stress: Stress Concern Present (08/05/2021)   [employer] of Occupational Health - Occupational Stress Questionnaire    Feeling of Stress : To some extent  Social Connections: Socially Isolated (08/11/2022)   Social Connection and Isolation Panel [NHANES]    Frequency of Communication with Friends and Family: Three times a week    Frequency of Social Gatherings with Friends and Family: Never    Attends Religious Services: Never    Database administrator or Organizations: No    Attends Engineer, structural: Not on file    Marital Status: Divorced    Tobacco Counseling Counseling given: Not Answered   Clinical Intake:  Pre-visit preparation completed: Yes  Pain : 0-10 Pain Score: 7  Pain Type: Acute pain Pain Location: Back Pain Orientation: Lower Pain Descriptors / Indicators: Aching, Constant Pain Onset: More than a month ago     BMI - recorded: 33 Nutritional Status: BMI > 30  Obese Nutritional Risks: None Diabetes: No  How often do you need to have someone help you when you read instructions, pamphlets, or other written materials from your doctor or pharmacy?: 1 - Never What is the last grade level you completed in school?: COLLEGE  Interpreter Needed?: No  Information entered by :: [facility] Happy Begeman   Activities of Daily Living    08/11/2022   11:11 AM 08/11/2022    9:05 AM  In your present state of health, do you have any difficulty performing the  following activities:  Hearing? 0 0  Vision? 0 0  Difficulty concentrating or making decisions? 1 1  Walking or climbing stairs? 0 0  Dressing or bathing? 0 0  Doing errands, shopping? 0 0  Preparing Food and eating ? N   Using the Toilet? N   In the past six months, have you accidently leaked urine? N   Do you have problems with loss of bowel control? N   Managing your Medications? N   Managing your Finances? N   Housekeeping or managing your Housekeeping? N     Patient Care Team: Inez Catalina, MD as PCP - General (Internal Medicine) Dione Booze, Bertram Millard, MD as Consulting Physician (Ophthalmology) Jodelle Gross, RN as [facility] Care Management  Indicate any recent Medical Services you may have received from other than [facility] providers in the past year (date may be approximate).     Assessment:   This is a routine wellness examination for Elisandra.  Hearing/Vision screen No results found.  Dietary issues and exercise activities discussed:     Goals Addressed   None   Depression Screen    08/11/2022   11:15 AM 08/11/2022   10:04 AM 06/06/2022    2:09 PM 02/24/2022    8:42 AM 11/18/2021    8:24 AM 08/05/2021   12:08 PM 02/14/2021    4:40 PM  PHQ 2/9 Scores  PHQ - 2 Score 2 2 4 6 4  0 5  PHQ- 9 Score 12 12 24 23 9  23     Fall Risk    08/11/2022   11:15 AM 08/11/2022    9:01 AM 06/06/2022    1:40 PM 02/24/2022    8:28 AM 11/18/2021    8:21 AM  Fall Risk   Falls in the past year? 1 1 0 0 0  Number falls in past yr: 1 1 0 0 0  Injury with Fall? 1 1 0 0 0  Risk for fall due to : Impaired balance/gait Impaired balance/gait;Impaired mobility No Fall Risks No Fall Risks No Fall Risks  Follow up Falls evaluation completed;Falls prevention discussed Falls evaluation completed;Falls prevention discussed Falls evaluation completed;Falls prevention discussed Falls evaluation completed;Falls prevention discussed Falls prevention discussed;Falls evaluation  completed    MEDICARE RISK AT HOME:  Medicare Risk at Home - 08/11/22 1115     Any stairs in or around the home? No    If so, are there any without handrails? No    Home free of loose throw rugs in walkways, pet beds, electrical cords, etc? Yes    Adequate lighting in your home to reduce risk of falls? Yes    Life alert? No    Use of a cane, walker or w/c? No    Grab bars in the bathroom? Yes    Shower chair or bench in shower? No    Elevated toilet seat or a handicapped toilet? No             TIMED UP AND GO:  Was the test performed?  No    Cognitive Function:        08/11/2022   11:18 AM 08/05/2021   12:16 PM  6CIT Screen  What Year? 0 points 0 points  What month? 0 points 0 points  What time? 0 points 0 points  Count back from 20 0 points 0 points  Months in reverse 0 points 0 points  Repeat phrase 0 points 0 points  Total Score 0 points 0 points    Immunizations Immunization History  Administered Date(s) Administered   Influenza Split 11/06/2011, 09/22/2018   Influenza Whole 10/23/2007, 11/03/2008, 10/05/2009   Influenza, High Dose Seasonal PF 09/20/2021   Influenza,inj,Quad PF,6+ Mos 10/15/2012, 09/24/2013, 11/10/2014   Influenza-Unspecified 08/23/2017   Moderna Sars-Covid-2 Vaccination 05/27/2019, 06/21/2019   Pneumococcal Polysaccharide-23 12/17/2013   Tdap 10/02/2017   Zoster Recombinant(Shingrix) 10/04/2021    TDAP status: Up to date  Flu Vaccine status: Up to date  Pneumococcal vaccine status: Up to date  Covid-19 vaccine status: Information provided on how to obtain vaccines.   Qualifies for Shingles Vaccine? Yes   Zostavax completed No   Shingrix Completed?: No.    Education has been provided regarding the importance of this vaccine. Patient has been advised to call insurance company to determine out of pocket expense if they have not yet received this vaccine. Advised may also receive vaccine at local pharmacy or Health Dept. Verbalized  acceptance and understanding.  Screening Tests Health Maintenance  Topic Date Due   Pneumonia Vaccine 39+ Years old (2 of 2 - PCV) 12/18/2014   Colonoscopy  04/10/2018   COVID-19 Vaccine (3 - 2023-24 season) 09/23/2021   Zoster Vaccines- Shingrix (2 of 2) 11/29/2021   INFLUENZA VACCINE  08/24/2022   MAMMOGRAM  03/30/2023   Medicare Annual Wellness (AWV)  08/11/2023   DTaP/Tdap/Td (2 - Td or Tdap) 10/03/2027   DEXA SCAN  Completed   Hepatitis C Screening  Completed   HPV VACCINES  Aged Out    Health Maintenance  Health Maintenance Due  Topic Date Due   Pneumonia Vaccine 25+ Years old (2 of 2 - PCV) 12/18/2014   Colonoscopy  04/10/2018   COVID-19 Vaccine (3 - 2023-24 season) 09/23/2021   Zoster Vaccines- Shingrix (2 of 2) 11/29/2021     Mammogram status: Completed 03/30/2023. Repeat every year  Bone Density status: Completed 03/29/2021. Results reflect: Bone density results: OSTEOPENIA. Repeat every 2 years.  Lung Cancer Screening: (Low Dose CT Chest recommended if Age 65-80 years, 20 pack-year currently smoking OR have quit w/in 15years.) does not qualify.   Lung Cancer Screening Referral: DEFERRED TO PCP   Additional Screening:  Hepatitis C Screening: does qualify; Completed 04/18/2019  Vision Screening: Recommended annual ophthalmology exams for early detection of glaucoma and other disorders of the eye. Is the patient up to date with their annual eye exam?  Yes  Who is the provider or what is the name of the office in which the patient attends annual eye exams? DOES NOT REMEMBER HER CARE EYE DR NAME  If pt is not established with a provider, would they like to be referred to a provider to establish care? No .   Dental Screening: Recommended annual dental exams for proper oral hygiene  Community Resource Referral / Chronic Care Management: CRR required this visit?  No   CCM required this visit?  No     Plan:     I have personally reviewed and noted the following  in the patient's chart:   Medical and social history Use of alcohol, tobacco or illicit drugs  Current medications and supplements including opioid prescriptions. Patient is currently taking opioid prescriptions. Information provided to patient regarding non-opioid alternatives. Patient advised to discuss non-opioid treatment plan with their provider. Functional ability and status Nutritional status Physical activity Advanced directives List of other physicians Hospitalizations, surgeries, and ER visits in previous 12 months Vitals Screenings to include cognitive, depression, and falls Referrals and appointments  In addition, I have reviewed and discussed with patient certain preventive protocols, quality metrics, and best practice recommendations. A written personalized care plan for preventive services as well as general preventive health recommendations were provided to patient.     Derrell Lolling, CMA   08/11/2022   After Visit Summary: (Declined) Due to this being a telephonic visit, with patients personalized plan was offered to patient but patient Declined AVS at this time   Nurse Notes: IN PERSON    Ms. Bells , Thank you for taking time to come for your Medicare Wellness Visit. I appreciate your ongoing commitment to your health goals. Please review the following plan we discussed and let me know if I can assist you in the future.   These are the goals we discussed:  Goals      Cope with Chronic Pain     Timeframe:  Long-Range Goal Priority:  High Start Date: 07/14/2020                         Expected End Date:   10/14/2020                    Follow Up Date 08/11/2020    - learn relaxation techniques - practice acceptance of chronic pain - practice relaxation or meditation daily - tell myself I can (not I can't) - use distraction techniques    Why is this important?   Stress makes chronic pain feel worse.  Feelings like depression, anxiety, stress and  anger can make your body more sensitive to pain.  Learning ways to cope with stress or depression may help you find some relief from the pain.     Notes:      Manage Chronic Pain     Timeframe:  Long-Range Goal Priority:  High Start Date:07/14/2020  Expected End Date:      10/14/2020                 Follow Up Date 08/11/2020    - keep track of prescription refills - plan exercise or activity when pain is best controlled - start a pain diary - track times pain is worst and when it is best    Why is this important?   Day-to-day life can be hard when you have chronic pain.  Pain medicine is just one piece of the treatment puzzle.  You can try these action steps to help you manage your pain.    Notes:      Prevent Falls     Track and Manage My Blood Pressure-Hypertension     Timeframe:  Long-Range Goal Priority:  High Start Date:                             Expected End Date:                       Follow Up Date 08/11/2020    - check blood pressure weekly - write blood pressure results in a log or diary    Why is this important?   You won't feel high blood pressure, but it can still hurt your blood vessels.  High blood pressure can cause heart or kidney problems. It can also cause a stroke.  Making lifestyle changes like losing a little weight or eating less salt will help.  Checking your blood pressure at home and at different times of the day can help to control blood pressure.  If the doctor prescribes medicine remember to take it the way the doctor ordered.  Call the office if you cannot afford the medicine or if there are questions about it.     Notes:         This is a list of the screening recommended for you and due dates:  Health Maintenance  Topic Date Due   Pneumonia Vaccine (2 of 2 - PCV) 12/18/2014   Colon Cancer Screening  04/10/2018   COVID-19 Vaccine (3 - 2023-24 season) 09/23/2021   Zoster (Shingles) Vaccine (2 of 2)  11/29/2021   Flu Shot  08/24/2022   Mammogram  03/30/2023   Medicare Annual Wellness Visit  08/11/2023   DTaP/Tdap/Td vaccine (2 - Td or Tdap) 10/03/2027   DEXA scan (bone density measurement)  Completed   Hepatitis C Screening  Completed   HPV Vaccine  Aged Out

## 2022-08-11 NOTE — Assessment & Plan Note (Signed)
>>  ASSESSMENT AND PLAN FOR OSTEOARTHRITIS OF MULTIPLE JOINTS WRITTEN ON 08/11/2022 11:29 AM BY DANESHVAR, NINA, MEDICAL STUDENT  One of her main concerns today is her knee pain, which she has focused on the right knee and says affects her ability to complete daily activities like cleaning her dog Modd and walking long distances. She lives alone and spends time with her dog and feels that her back pain also  keeps her from being able to help pick him up or bring groceries to her car. Although she says her back and knee pain limit her walking distance, she prefers to not use a cane and feels confident in her ability to ambulate without one. She does not lose her balance while walking or feel like she will fall. She relays feeling pain after walking long distances. Due to previous pain associated with steroid injections, she would not be interested in a steroid injection at this time. She relays that she is not interested in surgical intervention for her knee pain. Would not be interested in PT due to transportation difficulties. She does not currently take diclofenac or robaxin for pain. She takes lyrica and was previously taking tylenol 3 BID before she ran out. Since running out, she is taking lyrica and 6 advil a day to manage her pain. She takes tylenol BID a day during times where she anticipates her time to be worst (upon waking up and before bed to prevent insomnia).   Will increase her lyrica to 75mg  and follow up in 2-3 months. Have also put in a referral to Behavioral Health to further discuss coping with chronic pain and anxiety.
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
## 2022-08-11 NOTE — Assessment & Plan Note (Signed)
She is currently taking amlodipine as prescribed and tolerates this medication. Her BP is 139/67 today.   Will continue to recommended this regimen and follow up in 2-3 months.
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
## 2022-08-11 NOTE — Progress Notes (Signed)
Attestation for Student Documentation:  I personally was present and re-performed the history, physical exam and medical decision-making activities of this service and have verified that the service and findings are accurately documented in the student's note.  Ms. Charo is in her normal state of health today.  She does have chronic pain, chronic OA of the spine, chronic spinal stenosis.  We discussed multiple options for her pain today, including injectable therapy, surgical referral, PT, aquatherapy and she was not interested in any of these today.  She has mild suprapatellar swelling, no midline tenderness and no crepitus in the knee on the right.  Her left knee has very minimal crepitus.  I will plan to increase her Lyrica to 75mg  BID for now, continue current doses of tylenol #3, robaxin.  Refer to [facility] in behavioral health for help with chronic pain coping skills, depresision and anxiety.    Inez Catalina, MD 08/11/2022, 2:06 PM

## 2022-08-14 ENCOUNTER — Other Ambulatory Visit: Payer: Self-pay

## 2022-08-14 NOTE — Telephone Encounter (Signed)
Received call from pt who stated she's having a lot of pain; she has been in the bed most of the day. C/o back and knee pain.  She has difficulty getting out of bed. She stated nothing seems to help. I told pt I will inform her doctor. Stated she has not taken Lyrica during the day; she's afraid it would make her drowsy. She continues to take Advil. Stated she does not want any med stronger than Tylenol#3. I told pt I will inform Dr Criselda Peaches of her call.

## 2022-08-15 MED ORDER — ACETAMINOPHEN-CODEINE 300-30 MG PO TABS: ORAL_TABLET | ORAL | 0 refills | Status: DC

## 2022-08-16 ENCOUNTER — Other Ambulatory Visit: Payer: Self-pay | Admitting: Internal Medicine

## 2022-08-21 ENCOUNTER — Other Ambulatory Visit: Payer: Self-pay

## 2022-08-21 DIAGNOSIS — F3341 Major depressive disorder, recurrent, in partial remission: Secondary | ICD-10-CM

## 2022-08-21 DIAGNOSIS — F419 Anxiety disorder, unspecified: Secondary | ICD-10-CM

## 2022-08-21 NOTE — Telephone Encounter (Signed)
Pt stated  she ran out of her med  but she is wanting to know if she can go ahead and take another med she has in the home

## 2022-08-22 MED ORDER — ESCITALOPRAM OXALATE 20 MG PO TABS: 20.0000 mg | ORAL_TABLET | Freq: Every day | ORAL | 3 refills | Status: DC

## 2022-08-22 NOTE — Telephone Encounter (Signed)
I am not sure what medicine she is referring to?  I will send in a refill.

## 2022-08-29 ENCOUNTER — Other Ambulatory Visit: Payer: Self-pay | Admitting: Internal Medicine

## 2022-08-29 DIAGNOSIS — F330 Major depressive disorder, recurrent, mild: Secondary | ICD-10-CM

## 2022-08-30 ENCOUNTER — Ambulatory Visit (INDEPENDENT_AMBULATORY_CARE_PROVIDER_SITE_OTHER): Payer: 59

## 2022-08-30 VITALS — Ht 61.0 in | Wt 176.0 lb

## 2022-08-30 DIAGNOSIS — Z0000 Encounter for general adult medical examination without abnormal findings: Secondary | ICD-10-CM | POA: Diagnosis not present
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
## 2022-08-30 NOTE — Patient Instructions (Signed)
[name] , Thank you for taking time to come for your Medicare Wellness Visit. I appreciate your ongoing commitment to your health goals. Please review the following plan we discussed and let me know if I can assist you in the future.   Referrals/Orders/Follow-Ups/Clinician Recommendations: I have requested refills from your PCP for you.  Each day, aim for 6 glasses of water, plenty of protein in your diet and try to get up and walk/ stretch every hour for 5-10 minutes at a time.    This is a list of the screening recommended for you and due dates:  Health Maintenance  Topic Date Due   Pneumonia Vaccine (2 of 2 - PCV) 12/18/2014   Colon Cancer Screening  04/10/2018   Zoster (Shingles) Vaccine (2 of 2) 11/29/2021   Flu Shot  08/24/2022   Mammogram  03/30/2023   Medicare Annual Wellness Visit  08/30/2023   DTaP/Tdap/Td vaccine (2 - Td or Tdap) 10/03/2027   DEXA scan (bone density measurement)  Completed   Hepatitis C Screening  Completed   HPV Vaccine  Aged Out   COVID-19 Vaccine  Discontinued    Advanced directives: (Copy Requested) Please bring a copy of your health care power of attorney and living will to the office to be added to your chart at your convenience.  Next Medicare Annual Wellness Visit scheduled for next year: Yes  Preventive Care 58 Years and Older, Female Preventive care refers to lifestyle choices and visits with your health care provider that can promote health and wellness. What does preventive care include? A yearly physical exam. This is also called an annual well check. Dental exams once or twice a year. Routine eye exams. Ask your health care provider how often you should have your eyes checked. Personal lifestyle choices, including: Daily care of your teeth and gums. Regular physical activity. Eating a healthy diet. Avoiding tobacco and drug use. Limiting alcohol use. Practicing safe sex. Taking low-dose aspirin every day. Taking vitamin and  mineral supplements as recommended by your health care provider. What happens during an annual well check? The services and screenings done by your health care provider during your annual well check will depend on your age, overall health, lifestyle risk factors, and family history of disease. Counseling  Your health care provider may ask you questions about your: Alcohol use. Tobacco use. Drug use. Emotional well-being. Home and relationship well-being. Sexual activity. Eating habits. History of falls. Memory and ability to understand (cognition). Work and work Astronomer. Reproductive health. Screening  You may have the following tests or measurements: Height, weight, and BMI. Blood pressure. Lipid and cholesterol levels. These may be checked every 5 years, or more frequently if you are over 55 years old. Skin check. Lung cancer screening. You may have this screening every year starting at age 62 if you have a 30-pack-year history of smoking and currently smoke or have quit within the past 15 years. Fecal occult blood test (FOBT) of the stool. You may have this test every year starting at age 74. Flexible sigmoidoscopy or colonoscopy. You may have a sigmoidoscopy every 5 years or a colonoscopy every 10 years starting at age 20. Hepatitis C blood test. Hepatitis B blood test. Sexually transmitted disease (STD) testing. Diabetes screening. This is done by checking your blood sugar (glucose) after you have not eaten for a while (fasting). You may have this done every 1-3 years. Bone density scan. This is done to screen for osteoporosis. You may have this  done starting at age 72. Mammogram. This may be done every 1-2 years. Talk to your health care provider about how often you should have regular mammograms. Talk with your health care provider about your test results, treatment options, and if necessary, the need for more tests. Vaccines  Your health care provider may recommend  certain vaccines, such as: Influenza vaccine. This is recommended every year. Tetanus, diphtheria, and acellular pertussis (Tdap, Td) vaccine. You may need a Td booster every 10 years. Zoster vaccine. You may need this after age 78. Pneumococcal 13-valent conjugate (PCV13) vaccine. One dose is recommended after age 89. Pneumococcal polysaccharide (PPSV23) vaccine. One dose is recommended after age 27. Talk to your health care provider about which screenings and vaccines you need and how often you need them. This information is not intended to replace advice given to you by your health care provider. Make sure you discuss any questions you have with your health care provider. Document Released: 02/05/2015 Document Revised: 09/29/2015 Document Reviewed: 11/10/2014 Elsevier Interactive Patient Education  2017 [employer].  Fall Prevention in the Home Falls can cause injuries. They can happen to people of all ages. There are many things you can do to make your home safe and to help prevent falls. What can I do on the outside of my home? Regularly fix the edges of walkways and driveways and fix any cracks. Remove anything that might make you trip as you walk through a door, such as a raised step or threshold. Trim any bushes or trees on the path to your home. Use bright outdoor lighting. Clear any walking paths of anything that might make someone trip, such as rocks or tools. Regularly check to see if handrails are loose or broken. Make sure that both sides of any steps have handrails. Any raised decks and porches should have guardrails on the edges. Have any leaves, snow, or ice cleared regularly. Use sand or salt on walking paths during winter. Clean up any spills in your garage right away. This includes oil or grease spills. What can I do in the bathroom? Use night lights. Install grab bars by the toilet and in the tub and shower. Do not use towel bars as grab bars. Use non-skid mats or  decals in the tub or shower. If you need to sit down in the shower, use a plastic, non-slip stool. Keep the floor dry. Clean up any water that spills on the floor as soon as it happens. Remove soap buildup in the tub or shower regularly. Attach bath mats securely with double-sided non-slip rug tape. Do not have throw rugs and other things on the floor that can make you trip. What can I do in the bedroom? Use night lights. Make sure that you have a light by your bed that is easy to reach. Do not use any sheets or blankets that are too big for your bed. They should not hang down onto the floor. Have a firm chair that has side arms. You can use this for support while you get dressed. Do not have throw rugs and other things on the floor that can make you trip. What can I do in the kitchen? Clean up any spills right away. Avoid walking on wet floors. Keep items that you use a lot in easy-to-reach places. If you need to reach something above you, use a strong step stool that has a grab bar. Keep electrical cords out of the way. Do not use floor polish or wax  that makes floors slippery. If you must use wax, use non-skid floor wax. Do not have throw rugs and other things on the floor that can make you trip. What can I do with my stairs? Do not leave any items on the stairs. Make sure that there are handrails on both sides of the stairs and use them. Fix handrails that are broken or loose. Make sure that handrails are as long as the stairways. Check any carpeting to make sure that it is firmly attached to the stairs. Fix any carpet that is loose or worn. Avoid having throw rugs at the top or bottom of the stairs. If you do have throw rugs, attach them to the floor with carpet tape. Make sure that you have a light switch at the top of the stairs and the bottom of the stairs. If you do not have them, ask someone to add them for you. What else can I do to help prevent falls? Wear shoes that: Do not  have high heels. Have rubber bottoms. Are comfortable and fit you well. Are closed at the toe. Do not wear sandals. If you use a stepladder: Make sure that it is fully opened. Do not climb a closed stepladder. Make sure that both sides of the stepladder are locked into place. Ask someone to hold it for you, if possible. Clearly mark and make sure that you can see: Any grab bars or handrails. First and last steps. Where the edge of each step is. Use tools that help you move around (mobility aids) if they are needed. These include: Canes. Walkers. Scooters. Crutches. Turn on the lights when you go into a dark area. Replace any light bulbs as soon as they burn out. Set up your furniture so you have a clear path. Avoid moving your furniture around. If any of your floors are uneven, fix them. If there are any pets around you, be aware of where they are. Review your medicines with your doctor. Some medicines can make you feel dizzy. This can increase your chance of falling. Ask your doctor what other things that you can do to help prevent falls. This information is not intended to replace advice given to you by your health care provider. Make sure you discuss any questions you have with your health care provider. Document Released: 11/05/2008 Document Revised: 06/17/2015 Document Reviewed: 01/22/2016Managing Pain Without Opioids Opioids are strong medicines used to treat moderate to severe pain. For some people, especially those who have long-term (chronic) pain, opioids may not be the best choice for pain management due to: Side effects like nausea, constipation, and sleepiness. The risk of addiction (opioid use disorder). The longer you take opioids, the greater your risk of addiction. Pain that lasts for more than 3 months is called chronic pain. Managing chronic pain usually requires more than one approach and is often provided by a team of health care providers working together  (multidisciplinary approach). Pain management may be done at a pain management center or pain clinic. How to manage pain without the use of opioids Use non-opioid medicines Non-opioid medicines for pain may include: Over-the-counter or prescription non-steroidal anti-inflammatory drugs (NSAIDs). These may be the first medicines used for pain. They work well for muscle and bone pain, and they reduce swelling. Acetaminophen. This over-the-counter medicine may work well for milder pain but not swelling. Antidepressants. These may be used to treat chronic pain. A certain type of antidepressant (tricyclics) is often used. These medicines are given in lower  doses for pain than when used for depression. Anticonvulsants. These are usually used to treat seizures but may also reduce nerve (neuropathic) pain. Muscle relaxants. These relieve pain caused by sudden muscle tightening (spasms). You may also use a pain medicine that is applied to the skin as a patch, cream, or gel (topical analgesic), such as a numbing medicine. These may cause fewer side effects than medicines taken by mouth. Do certain therapies as directed Some therapies can help with pain management. They include: Physical therapy. You will do exercises to gain strength and flexibility. A physical therapist may teach you exercises to move and stretch parts of your body that are weak, stiff, or painful. You can learn these exercises at physical therapy visits and practice them at home. Physical therapy may also involve: Massage. Heat wraps or applying heat or cold to affected areas. Electrical signals that interrupt pain signals (transcutaneous electrical nerve stimulation, TENS). Weak lasers that reduce pain and swelling (low-level laser therapy). Signals from your body that help you learn to regulate pain (biofeedback). Occupational therapy. This helps you to learn ways to function at home and work with less pain. Recreational therapy. This  involves trying new activities or hobbies, such as a physical activity or drawing. Mental health therapy, including: Cognitive behavioral therapy (CBT). This helps you learn coping skills for dealing with pain. Acceptance and commitment therapy (ACT) to change the way you think and react to pain. Relaxation therapies, including muscle relaxation exercises and mindfulness-based stress reduction. Pain management counseling. This may be individual, family, or group counseling.  Receive medical treatments Medical treatments for pain management include: Nerve block injections. These may include a pain blocker and anti-inflammatory medicines. You may have injections: Near the spine to relieve chronic back or neck pain. Into joints to relieve back or joint pain. Into nerve areas that supply a painful area to relieve body pain. Into muscles (trigger point injections) to relieve some painful muscle conditions. A medical device placed near your spine to help block pain signals and relieve nerve pain or chronic back pain (spinal cord stimulation device). Acupuncture. Follow these instructions at home Medicines Take over-the-counter and prescription medicines only as told by your health care provider. If you are taking pain medicine, ask your health care providers about possible side effects to watch out for. Do not drive or use heavy machinery while taking prescription opioid pain medicine. Lifestyle  Do not use drugs or alcohol to reduce pain. If you drink alcohol, limit how much you have to: 0-1 drink a day for women who are not pregnant. 0-2 drinks a day for men. Know how much alcohol is in a drink. In the [place] one drink equals one 12 oz bottle of beer (355 mL), one 5 oz glass of wine (148 mL), or one 1 oz glass of hard liquor (44 mL). Do not use any products that contain nicotine or tobacco. These products include cigarettes, chewing tobacco, and vaping devices, such as e-cigarettes. If you  need help quitting, ask your health care provider. Eat a healthy diet and maintain a healthy weight. Poor diet and excess weight may make pain worse. Eat foods that are high in fiber. These include fresh fruits and vegetables, whole grains, and beans. Limit foods that are high in fat and processed sugars, such as fried and sweet foods. Exercise regularly. Exercise lowers stress and may help relieve pain. Ask your health care provider what activities and exercises are safe for you. If your health care provider  approves, join an exercise class that combines movement and stress reduction. Examples include yoga and tai chi. Get enough sleep. Lack of sleep may make pain worse. Lower stress as much as possible. Practice stress reduction techniques as told by your therapist. General instructions Work with all your pain management providers to find the treatments that work best for you. You are an important member of your pain management team. There are many things you can do to reduce pain on your own. Consider joining an online or in-person support group for people who have chronic pain. Keep all follow-up visits. This is important. Where to find more information You can find more information about managing pain without opioids from: American Academy of Pain Medicine: painmed.org Institute for Chronic Pain: instituteforchronicpain.org American Chronic Pain Association: theacpa.org Contact a health care provider if: You have side effects from pain medicine. Your pain gets worse or does not get better with treatments or home therapy. You are struggling with anxiety or depression. Summary Many types of pain can be managed without opioids. Chronic pain may respond better to pain management without opioids. Pain is best managed when you and a team of health care providers work together. Pain management without opioids may include non-opioid medicines, medical treatments, physical therapy, mental health  therapy, and lifestyle changes. Tell your health care providers if your pain gets worse or is not being managed well enough. This information is not intended to replace advice given to you by your health care provider. Make sure you discuss any questions you have with your health care provider. Document Revised: 04/21/2020 Document Reviewed: 04/21/2020 Elsevier Patient Education  2024 [employer].

## 2022-09-08 ENCOUNTER — Telehealth: Payer: Self-pay | Admitting: *Deleted

## 2022-09-08 NOTE — Telephone Encounter (Signed)
Call from pt who stated she felled in the yard then she bumped into a tree limb She hurt her right knee/leg and has a bruise on her left leg. Stated her bottom hurts also. Requesting something for pain; half refill of Tylenol #3 (states she out of med) or something else (not too strong per pt). Thanks

## 2022-09-11 MED ORDER — ACETAMINOPHEN-CODEINE 300-30 MG PO TABS: ORAL_TABLET | ORAL | 0 refills | Status: DC

## 2022-09-11 NOTE — Telephone Encounter (Signed)
Called pt - no answer; left message of refill. 

## 2022-09-11 NOTE — Telephone Encounter (Signed)
Approve refill 5 days early.

## 2022-10-05 ENCOUNTER — Other Ambulatory Visit: Payer: Self-pay | Admitting: Internal Medicine

## 2022-10-11 ENCOUNTER — Encounter: Payer: 59 | Admitting: Internal Medicine

## 2022-10-20 ENCOUNTER — Encounter: Payer: 59 | Admitting: Internal Medicine

## 2022-10-27 ENCOUNTER — Other Ambulatory Visit: Payer: Self-pay | Admitting: Internal Medicine

## 2022-10-27 DIAGNOSIS — Z1212 Encounter for screening for malignant neoplasm of rectum: Secondary | ICD-10-CM

## 2022-10-27 DIAGNOSIS — Z1211 Encounter for screening for malignant neoplasm of colon: Secondary | ICD-10-CM

## 2022-10-28 ENCOUNTER — Other Ambulatory Visit: Payer: Self-pay | Admitting: Student in an Organized Health Care Education/Training Program

## 2022-11-20 ENCOUNTER — Other Ambulatory Visit: Payer: Self-pay | Admitting: Internal Medicine

## 2022-11-20 DIAGNOSIS — F330 Major depressive disorder, recurrent, mild: Secondary | ICD-10-CM

## 2022-11-20 NOTE — Telephone Encounter (Signed)
Next appt scheduled 11/22 with PCP.

## 2022-11-25 ENCOUNTER — Other Ambulatory Visit: Payer: Self-pay | Admitting: Internal Medicine

## 2022-11-27 NOTE — Telephone Encounter (Signed)
Next appt scheduled 11/22 with PCP.

## 2022-12-11 ENCOUNTER — Encounter: Payer: Self-pay | Admitting: *Deleted

## 2022-12-15 ENCOUNTER — Encounter: Payer: Self-pay | Admitting: Internal Medicine

## 2022-12-15 ENCOUNTER — Ambulatory Visit (INDEPENDENT_AMBULATORY_CARE_PROVIDER_SITE_OTHER): Payer: 59 | Admitting: Internal Medicine

## 2022-12-15 DIAGNOSIS — M150 Primary generalized (osteo)arthritis: Secondary | ICD-10-CM

## 2022-12-15 NOTE — Progress Notes (Signed)
Attempted to call twice at 10:10AM  Attempt to call at 1045am  Attempted to call at 11am.    No show for appointment, reschedule.

## 2022-12-18 ENCOUNTER — Other Ambulatory Visit: Payer: Self-pay

## 2022-12-18 DIAGNOSIS — I10 Essential (primary) hypertension: Secondary | ICD-10-CM

## 2022-12-18 DIAGNOSIS — J00 Acute nasopharyngitis [common cold]: Secondary | ICD-10-CM

## 2022-12-18 MED ORDER — ACETAMINOPHEN-CODEINE 300-30 MG PO TABS: ORAL_TABLET | ORAL | 0 refills | Status: DC

## 2022-12-18 MED ORDER — AMLODIPINE BESYLATE 2.5 MG PO TABS: 2.5000 mg | ORAL_TABLET | Freq: Every day | ORAL | 3 refills | Status: DC

## 2022-12-18 MED ORDER — FLUTICASONE PROPIONATE 50 MCG/ACT NA SUSP: 1.0000 | Freq: Every day | NASAL | 11 refills | Status: DC

## 2023-01-08 ENCOUNTER — Other Ambulatory Visit: Payer: Self-pay | Admitting: Internal Medicine

## 2023-01-08 DIAGNOSIS — F330 Major depressive disorder, recurrent, mild: Secondary | ICD-10-CM

## 2023-01-08 NOTE — Telephone Encounter (Signed)
Call from pt - stated she felled bringing in groceries last week;hit her right knee which is painful and swollen. She has been taking 2 pills of Tylenol w/codeine. Requesting an early refill. Stated she only has 2 pills left. Having difficulty walking, She has an appt next week 12/23. Unable to come in sooner b/c she does not have a car. Thanks

## 2023-01-09 NOTE — Telephone Encounter (Signed)
Okay for one time early refill due to acute injury. Will need to keep her appointment next week, 12/23, or present to the ED/urgent care sooner if symptoms are worsening.

## 2023-01-15 ENCOUNTER — Ambulatory Visit (INDEPENDENT_AMBULATORY_CARE_PROVIDER_SITE_OTHER): Payer: 59 | Admitting: Internal Medicine

## 2023-01-15 ENCOUNTER — Encounter: Payer: Self-pay | Admitting: Internal Medicine

## 2023-01-15 VITALS — BP 116/69 | HR 79 | Temp 98.2°F | Ht 61.0 in | Wt 168.6 lb

## 2023-01-15 DIAGNOSIS — G8929 Other chronic pain: Secondary | ICD-10-CM

## 2023-01-15 DIAGNOSIS — F332 Major depressive disorder, recurrent severe without psychotic features: Secondary | ICD-10-CM | POA: Diagnosis not present

## 2023-01-15 DIAGNOSIS — M25561 Pain in right knee: Secondary | ICD-10-CM

## 2023-01-15 DIAGNOSIS — K219 Gastro-esophageal reflux disease without esophagitis: Secondary | ICD-10-CM

## 2023-01-15 DIAGNOSIS — I10 Essential (primary) hypertension: Secondary | ICD-10-CM | POA: Diagnosis not present

## 2023-01-15 MED ORDER — DULOXETINE HCL 30 MG PO CPEP
30.0000 mg | ORAL_CAPSULE | Freq: Every day | ORAL | 11 refills | Status: DC
Start: 2023-01-15 — End: 2023-04-17

## 2023-01-15 NOTE — Progress Notes (Unsigned)
   CC: leg pain/overdue follow up  HPI:  [name] is a 72 y.o. with medical history of HTN, GERD, OA, DDD presenting to [facility] for a follow up and complaint of leg pain.   Please see problem-based list for further details, assessments, and plans.  Past Medical History:  Diagnosis Date   Anxiety    Arthritis    Depression    GERD (gastroesophageal reflux disease)    PONV (postoperative nausea and vomiting)    Vegetarian diet 08/18/2011     Current Outpatient Medications (Cardiovascular):    amLODipine (NORVASC) 2.5 MG tablet, Take 1 tablet (2.5 mg total) by mouth daily.  Current Outpatient Medications (Respiratory):    fluticasone (FLONASE) 50 MCG/ACT nasal spray, Place 1 spray into both nostrils daily.  Current Outpatient Medications (Analgesics):    acetaminophen-codeine (TYLENOL #3) 300-30 MG tablet, TAKE 1 TO 2 TABLETS BY MOUTH EVERY 8 HOURS AS NEEDED FOR MODERATE PAIN   Current Outpatient Medications (Other):    ALPRAZolam (XANAX) 0.5 MG tablet, TAKE 1 TO 2 TABLETS BY MOUTH AS NEEDED FOR ANXIETY . DO NOT EXCEED 4 PER 24 HOURS   diclofenac Sodium (EQ ARTHRITIS PAIN RELIEVER) 1 % GEL, APPLY 4 GRAMS TOPICALLY 4 TIMES DAILY   escitalopram (LEXAPRO) 20 MG tablet, Take 1 tablet (20 mg total) by mouth daily.   omeprazole (PRILOSEC) 20 MG capsule, Take 1 capsule (20 mg total) by mouth daily.   pregabalin (LYRICA) 75 MG capsule, Take 1 capsule (75 mg total) by mouth 2 (two) times daily.  Review of Systems:  Review of system negative unless stated in the problem list or HPI.    Physical Exam:  Vitals:   01/15/23 1411  BP: 116/69  Pulse: 79  Temp: 98.2 F (36.8 C)  TempSrc: Oral  SpO2: 100%  Weight: 168 lb 9.6 oz (76.5 kg)  Height: 5\' 1"  (1.549 m)   Physical Exam General: NAD HENT: NCAT Lungs: CTAB, no wheeze, rhonchi or rales.  Cardiovascular: Normal heart sounds, no r/m/g, 2+ pulses in all extremities. No LE edema Abdomen: No TTP, normal bowel  sounds MSK: No asymmetry or muscle atrophy.  Skin: no lesions noted on exposed skin Neuro: Alert and oriented x4. CN grossly intact Psych: Normal mood and normal affect   Assessment & Plan:   No problem-specific Assessment & Plan notes found for this encounter.   See Encounters Tab for problem based charting.  Patient Discussed with Dr. {NAMES:[id number]::"Guilloud","Hoffman","Mullen","Narendra","Vincent","Machen","Lau","Hatcher","Williams"} Gwenevere Abbot, MD Eligha Bridegroom. [facility] Internal Medicine Residency, PGY-3   HTN On amlodipine 2.5 mg every day.   OA/DDD multiple level spinal stenosis.  On Tylenol #3, Pregablin 75 mg BID  GAD On lexapro 20 mg every day. Xanax 0.5 mg prn.   GERD PPI 20 mg every day.   Right Knee Pain: has been bothering for 6 month. Seen orthopedic and received a shot.

## 2023-01-15 NOTE — Patient Instructions (Addendum)
[name], it was a pleasure seeing you today! You endorsed feeling well today. Below are some of the things we talked about this visit. We look forward to seeing you in the follow up appointment!  Today we discussed: For your depression, we will change your lexapro to cymbalta. Please take cymbalta daily, don't take lexapro and cymbalta together.    Stop your amlodipine. Check your blood pressure at home, if it is more than 140/90, restart it.   I have ordered the following labs today:  Lab Orders  No laboratory test(s) ordered today      Referrals ordered today:   Referral Orders  No referral(s) requested today     I have ordered the following medication/changed the following medications:   Stop the following medications: Medications Discontinued During This Encounter  Medication Reason   methocarbamol (ROBAXIN) 500 MG tablet Patient has not taken in last 30 days   amLODipine (NORVASC) 2.5 MG tablet Discontinued by provider   escitalopram (LEXAPRO) 20 MG tablet Discontinued by provider     Start the following medications: Meds ordered this encounter  Medications   DULoxetine (CYMBALTA) 30 MG capsule    Sig: Take 1 capsule (30 mg total) by mouth daily.    Dispense:  30 capsule    Refill:  11     Follow-up: 3 month follow up   Please make sure to arrive 15 minutes prior to your next appointment. If you arrive late, you may be asked to reschedule.   We look forward to seeing you next time. Please call our clinic at [phone] if you have any questions or concerns. The best time to call is Monday-Friday from 9am-4pm, but there is someone available 24/7. If after hours or the weekend, call the main hospital number and ask for the Internal Medicine Resident On-Call. If you need medication refills, please notify your pharmacy one week in advance and they will send [place] a request.  Thank you for letting us take part in your care. Wishing you the best!  Thank  you, Gwenevere Abbot, MD

## 2023-01-17 ENCOUNTER — Encounter: Payer: Self-pay | Admitting: Internal Medicine

## 2023-01-17 DIAGNOSIS — M25561 Pain in right knee: Secondary | ICD-10-CM | POA: Insufficient documentation

## 2023-01-17 NOTE — Assessment & Plan Note (Signed)
Pt's BP is well controlled on amlodipine 2.5 mg daily. Given that amlodipine is such a low dose and her BP is 116/69 will discontinue her amlodipine this visit. Advised to call our office if her BP at home is >140/90.

## 2023-01-17 NOTE — Assessment & Plan Note (Signed)
Well controlled on prilosec 20mg  daily.

## 2023-01-17 NOTE — Assessment & Plan Note (Signed)
Patient with right knee pain that has been bothering for 6 month. No trauma to the knee. She was seen in the ED and plain radiography was done that showed chondrocalcinosis and medial compartment joint narrowing. On exam slight effusion present and TTP of joint line. Overall consistent with OA. She was previously seen by orthopedic and received a shot. Discussed different therapies including repeating knee injection with joint aspiration this time but pt states she will defer for now. Offered PT but pt deferred.

## 2023-01-17 NOTE — Assessment & Plan Note (Signed)
Pt with significant depression symptoms with major symptoms of anhedonia. She is on lexapro 20 mg every day but not fully adherent to this. Discussed switching her over to duloxetine which she does not recall being on. Advised this can help with her DDD and nerve pain associated with that. Also discussed therapy with the patient but pt states she is not interested in that. Advised to pick up habits and group hobbies as that will help with her mood as well. No SI or HI present.

## 2023-01-18 NOTE — Progress Notes (Signed)
 Internal Medicine Clinic Attending  Case discussed with the resident physician at the time of the visit.  We reviewed the patient's history, exam, and pertinent patient test results.  I agree with the assessment, diagnosis, and plan of care documented in the resident's note.

## 2023-01-20 ENCOUNTER — Encounter: Payer: Self-pay | Admitting: *Deleted

## 2023-01-29 ENCOUNTER — Telehealth: Payer: Self-pay | Admitting: *Deleted

## 2023-01-29 ENCOUNTER — Other Ambulatory Visit: Payer: Self-pay | Admitting: Internal Medicine

## 2023-01-29 DIAGNOSIS — M150 Primary generalized (osteo)arthritis: Secondary | ICD-10-CM

## 2023-01-29 DIAGNOSIS — F332 Major depressive disorder, recurrent severe without psychotic features: Secondary | ICD-10-CM

## 2023-01-29 DIAGNOSIS — G8929 Other chronic pain: Secondary | ICD-10-CM

## 2023-01-29 MED ORDER — ACETAMINOPHEN-CODEINE 300-30 MG PO TABS
ORAL_TABLET | ORAL | 0 refills | Status: DC
Start: 2023-01-29 — End: 2023-02-27

## 2023-01-29 NOTE — Progress Notes (Signed)
 Called patient per her request. She reported feeling down for no reason. Stated she had no support but at the same time reported declining opportunities to spend time with her family. She reported pain as contributing to her mood by stating if her joint pain flares up then it makes her feel useless. She is denying any SI/HI. Her dog is her protective factor. Re-addressed the role of therapy to help her mood. Pt was receptive so will order IBH referral for CBT. Pt to start her cymbalta  today which can help with pain and mood. Pt reports she is out of her Tylenol  #3, so we will refill this. Discussed referring her to exercise program but pt not amenable. Advised to call her family and use offered opportunities to spend time with them. Gave ED precautions and pt understanding.    Morene Bathe, MD Jolynn DEL. [facility] Internal Medicine Residency, PGY-3

## 2023-01-29 NOTE — Telephone Encounter (Signed)
 Call from patient states she is feeling a little down today.  Stated would not do anything to herself though.  States has no transportation.  Has pain in her knees as usual and it will wake her up at night. Stated at last visit the doctor wanted to drain fluid from her knee but she refused as this was done before and she had pain for 6 days.  Asked patient if she had taken her Xanax  today.  Stated that she only took 1 although she could take 2.  Does not want to get addicted to the Xanax .  Patient got a little teary and said that she is unable to get out even to get her hair done.  Again stated will not harm herself as her dog needs her.  Stated also that she and the dog have eaten today.  Does not to use the transportation that her insurance company will provide to get out to do errands.  Stated that other people needed it worse..When asked about needing to take another Xanax  she stated was saving when she is really down and cannot get out.  Informed patient that this seemed like a great time to take another 1 to see if it will help. Informed patient that a message will be sent to her doctor to see if someone will give her a call.  Patient will take another Xanax  and await a call from the Clinics.

## 2023-02-01 NOTE — Addendum Note (Signed)
 Addended by: Gwenevere Abbot on: 02/01/2023 03:51 PM   Modules accepted: Orders

## 2023-02-02 ENCOUNTER — Telehealth: Payer: Self-pay | Admitting: *Deleted

## 2023-02-02 NOTE — Progress Notes (Signed)
 Complex Care Management Note  Care Guide Note 02/02/2023 Name: [name] MRN: [id number] DOB: 1950/02/17  [name] is a 73 y.o. year old female who sees Fernand Prost, MD for primary care. I reached out to [employer] by phone today to offer complex care management services.  [name] was given information about Complex Care Management services today including:   The Complex Care Management services include support from the care team which includes your Nurse Coordinator, Clinical Social Worker, or Pharmacist.  The Complex Care Management team is here to help remove barriers to the health concerns and goals most important to you. Complex Care Management services are voluntary, and the patient may decline or stop services at any time by request to their care team member.   Complex Care Management Consent Status: Patient agreed to services and verbal consent obtained.   Follow up plan:  Telephone appointment with complex care management team member scheduled for:  02/05/23  Encounter Outcome:  Patient Scheduled  [facility] Coordination Care Guide  Direct Dial: [phone]

## 2023-02-05 ENCOUNTER — Telehealth: Payer: Self-pay | Admitting: Licensed Clinical Social Worker

## 2023-02-05 ENCOUNTER — Encounter: Payer: Self-pay | Admitting: Licensed Clinical Social Worker

## 2023-02-05 NOTE — Patient Outreach (Signed)
  Care Coordination   02/05/2023 Name: [name] MRN: [id number] DOB: 28-May-1950   Care Coordination Outreach Attempts:  An unsuccessful telephone outreach was attempted today to offer the patient information about available complex care management services.  Follow Up Plan:  Additional outreach attempts will be made to offer the patient complex care management information and services.   Encounter Outcome:  No Answer   Care Coordination Interventions:  No, not indicated    Alm Armor, LCSW [place], [facility] Licensed Clinical Social Worker Care Coordinator [phone]

## 2023-02-08 ENCOUNTER — Ambulatory Visit: Payer: Self-pay | Admitting: Licensed Clinical Social Worker

## 2023-02-08 NOTE — Patient Instructions (Signed)
Visit Information  Thank you for taking time to visit with me today. Please don't hesitate to contact me if I can be of assistance to you.   Following are the goals we discussed today:   Goals Addressed             This Visit's Progress    Reduce symptoms of depression       Care Coordination Interventions: Assessed Social Determinants of Health Reviewed all upcoming appointments in Epic system Motivational Interviewing employed Depression screen reviewed  Mindfulness or Relaxation training provided Active listening / Reflection utilized  Emotional Support Provided Behavioral Activation reviewed Review Behavioral Activation materials sent in the mail          Our next appointment is by telephone on 02/23/23 at 1PM  Please call the care guide team at [phone] if you need to cancel or reschedule your appointment.   If you are experiencing a Mental Health or Behavioral Health Crisis or need someone to talk to, please call the Suicide and Crisis Lifeline: 988  Patient verbalizes understanding of instructions and care plan provided today and agrees to view in MyChart. Active MyChart status and patient understanding of how to access instructions and care plan via MyChart confirmed with patient.     Telephone follow up appointment with care management team member scheduled for: 02/23/2023

## 2023-02-08 NOTE — Patient Outreach (Signed)
  Care Coordination   Initial Visit Note   02/08/2023 Name: [name] MRN: [id number] DOB: 1950-05-01  [name] is a 73 y.o. year old female who sees Gwenevere Abbot, MD for primary care. I spoke with  Lamoine J [employer] by phone today.  What matters to the patients health and wellness today?  Managing depressive symptoms.    Goals Addressed             This Visit's Progress    Reduce symptoms of depression       Care Coordination Interventions: Assessed Social Determinants of Health Reviewed all upcoming appointments in Epic system Motivational Interviewing employed Depression screen reviewed  Mindfulness or Relaxation training provided Active listening / Reflection utilized  Emotional Support Provided Behavioral Activation reviewed Review Actor sent in the mail          SDOH assessments and interventions completed:  Yes  SDOH Interventions Today    Flowsheet Row Most Recent Value  SDOH Interventions   Food Insecurity Interventions Intervention Not Indicated  Housing Interventions Intervention Not Indicated  Transportation Interventions Intervention Not Indicated  Utilities Interventions Intervention Not Indicated  Depression Interventions/Treatment  Patient refuses Treatment  [CSW will continue to follow up]        Care Coordination Interventions:  Yes, provided   Interventions Today    Flowsheet Row Most Recent Value  Chronic Disease   Chronic disease during today's visit Other  [Depression]  General Interventions   General Interventions Discussed/Reviewed Community Resources  [facility] as a way to get out and socialize more - pt denied attending this for now as she has no interest in getting out.]  Exercise Interventions   Exercise Discussed/Reviewed Exercise Reviewed  [Pt does get out of the home some to walk her dog, this has slowed down in recent years.]  Mental Health  Interventions   Mental Health Discussed/Reviewed Mental Health Discussed, Mental Health Reviewed, Depression, Coping Strategies  [CSW and pt discussed pt's hx of depression. Pt reported depresson for many years, she's tried talking to counselors/psych but it has not helped. Not interested in counseling at this time. Discussed Behavioral Activation]  Safety Interventions   Safety Discussed/Reviewed Safety Discussed  [CSW and pt reviewed safety. Pt stated she is not currnetly having any SI, has had them in the past. Her protective factors are her dog and continuing to take care of him.]        Follow up plan: Follow up call scheduled for 02/23/2023    Encounter Outcome:  Patient Visit Completed   Kenton Kingfisher, LCSW [place], [facility] Licensed Clinical Social Worker Care Coordinator [phone]

## 2023-02-13 ENCOUNTER — Other Ambulatory Visit: Payer: Self-pay | Admitting: Internal Medicine

## 2023-02-13 DIAGNOSIS — F330 Major depressive disorder, recurrent, mild: Secondary | ICD-10-CM

## 2023-02-23 ENCOUNTER — Ambulatory Visit: Payer: Self-pay | Admitting: Licensed Clinical Social Worker

## 2023-02-23 NOTE — Patient Outreach (Signed)
  Care Coordination   02/23/2023 Name: MEARL HAREWOOD MRN: [id number] DOB: [birth year]   Care Coordination Outreach Attempts:  An unsuccessful telephone outreach was attempted today to offer the patient information about available complex care management services.  Follow Up Plan:  Additional outreach attempts will be made to offer the patient complex care management information and services.   Encounter Outcome:  No Answer   Care Coordination Interventions:  No, not indicated    Kenton Kingfisher, LCSW [place], [facility] Licensed Clinical Social Worker Care Coordinator [phone]

## 2023-02-27 ENCOUNTER — Ambulatory Visit (INDEPENDENT_AMBULATORY_CARE_PROVIDER_SITE_OTHER): Payer: 59 | Admitting: Student

## 2023-02-27 VITALS — BP 169/102 | HR 86 | Ht 61.0 in | Wt 168.3 lb

## 2023-02-27 DIAGNOSIS — G8929 Other chronic pain: Secondary | ICD-10-CM

## 2023-02-27 DIAGNOSIS — M1120 Other chondrocalcinosis, unspecified site: Secondary | ICD-10-CM

## 2023-02-27 DIAGNOSIS — M150 Primary generalized (osteo)arthritis: Secondary | ICD-10-CM | POA: Diagnosis not present

## 2023-02-27 DIAGNOSIS — M25561 Pain in right knee: Secondary | ICD-10-CM | POA: Diagnosis not present

## 2023-02-27 MED ORDER — DICLOFENAC SODIUM 1 % EX GEL
2.0000 g | Freq: Four times a day (QID) | CUTANEOUS | 3 refills | Status: DC
Start: 2023-02-27 — End: 2023-07-19

## 2023-02-27 MED ORDER — ACETAMINOPHEN-CODEINE 300-30 MG PO TABS
ORAL_TABLET | ORAL | 0 refills | Status: DC
Start: 2023-02-27 — End: 2023-03-23

## 2023-02-27 MED ORDER — COLCHICINE 0.6 MG PO TABS
0.6000 mg | ORAL_TABLET | Freq: Two times a day (BID) | ORAL | 2 refills | Status: DC
Start: 2023-02-27 — End: 2023-04-17

## 2023-02-27 NOTE — Assessment & Plan Note (Addendum)
 Patient following up further ongoing right knee pain.  They have been on Tylenol  with codeine  for this for some years.  She has tried knee injections in the past, but not limited relief with this.  She is taking ibuprofen, not taking Tylenol  at this time.  She does have a dog, wants to avoid any procedure which would make it so she cannot take care of her dog.  She says Voltaren  gel helps a little bit.  Was not interested in PT last session, continues not to be interested due to lack of transportation.  Patient does cracking and catching of the knee, especially in deep flexion.  She states that she has fallen multiple times on the right knee due to it giving out when she goes to bend over and pick things up.  Her x-ray did show some some chondrocalcinosis and effusion.  On exam today she does have a bit of effusion as well as pain and tenderness over the quad and patellar tendon.  Strength limited by pain today.  Differential would include pseudogout, knee osteoarthritis, patellar tendinitis, quad tendinitis.  Her patellofemoral spaces are well-maintained, though she does have some osteoarthritic changes on her imaging.  The majority of her symptoms are concentrated around her patella, no sunrise view available on prior imaging.  Plan: Patient would like to avoid opioids, however says that the Tylenol  codeine  is the only thing that provides her with acute relief Will refill 30 doses Tylenol  with codeine , patient to follow in 2 weeks with us  for reevaluation. Would likely need pain contract and tox assure for chronic opioids if plans are to continue Tylenol  with codeine  use. Reiterated importance of multi modal pain management with Tylenol , NSAIDs, Voltaren  gel. Will initiate colchicine  0.6 mg twice daily Patient not interested in PT referral Orthopedics referral May benefit from x-rays with sunrise view in the future for assessment of the patellofemoral joint.

## 2023-02-27 NOTE — Progress Notes (Signed)
 Subjective:  CC: Knee pain  HPI:  [name] is a 73 y.o. person with a past medical history stated below and presents today for the stated chief complaint. Please see problem based assessment and plan for additional details.  Past Medical History:  Diagnosis Date   Anxiety    Arthritis    Chronic sinusitis 04/30/2013   Depression    Diverticulosis 04/19/2011   Dx on colonoscopy in 05/2006     GERD (gastroesophageal reflux disease)    PONV (postoperative nausea and vomiting)    Vegetarian diet 08/18/2011    Current Outpatient Medications on File Prior to Visit  Medication Sig Dispense Refill   ALPRAZolam  (XANAX ) 0.5 MG tablet TAKE 1 TO 2 TABLETS BY MOUTH AS NEEDED FOR ANXIETY . DO NOT EXCEED 4 PER 24 HOURS 120 tablet 0   DULoxetine  (CYMBALTA ) 30 MG capsule Take 1 capsule (30 mg total) by mouth daily. 30 capsule 11   fluticasone  (FLONASE ) 50 MCG/ACT nasal spray Place 1 spray into both nostrils daily. 16 g 11   omeprazole  (PRILOSEC) 20 MG capsule Take 1 capsule (20 mg total) by mouth daily. 90 capsule 3   pregabalin  (LYRICA ) 75 MG capsule Take 1 capsule (75 mg total) by mouth 2 (two) times daily. 60 capsule 3   [DISCONTINUED] exenatide  (BYETTA  5 MCG PEN) 5 MCG/0.02ML SOLN Inject 0.02 mLs (5 mcg total) into the skin 2 (two) times daily with a meal. 1.2 mL 3   [DISCONTINUED] famotidine  (PEPCID ) 40 MG tablet Take 1 tablet (40 mg total) by mouth daily. 30 tablet 11   No current facility-administered medications on file prior to visit.    Review of Systems: Please see assessment and plan for pertinent positives and negatives.  Objective:   Vitals:   02/27/23 0923 02/27/23 0947  BP: (!) 156/93 (!) 169/102  Pulse: 79 86  SpO2: 98%   Weight: 168 lb 4.8 oz (76.3 kg)   Height: 5' 1 (1.549 m)     Physical Exam: Constitutional: Well-appearing Cardiovascular: Regular rate and rhythm Pulmonary/Chest: lungs clear to auscultation bilaterally Abdominal: soft,  non-tender, non-distended Extremities: No edema of the lower extremities bilaterally Thought process is linear and is goal-directed.     Assessment & Plan:  Knee pain, right Patient following up further ongoing right knee pain.  They have been on Tylenol  with codeine  for this for some years.  She has tried knee injections in the past, but not limited relief with this.  She is taking ibuprofen, not taking Tylenol  at this time.  She does have a dog, wants to avoid any procedure which would make it so she cannot take care of her dog.  She says Voltaren  gel helps a little bit.  Was not interested in PT last session, continues not to be interested due to lack of transportation.  Patient does cracking and catching of the knee, especially in deep flexion.  She states that she has fallen multiple times on the right knee due to it giving out when she goes to bend over and pick things up.  Her x-ray did show some some chondrocalcinosis and effusion.  On exam today she does have a bit of effusion as well as pain and tenderness over the quad and patellar tendon.  Strength limited by pain today.  Differential would include pseudogout, knee osteoarthritis, patellar tendinitis, quad tendinitis.  Her patellofemoral spaces are well-maintained, though she does have some osteoarthritic changes on her imaging.  The majority of her symptoms  are concentrated around her patella, no sunrise view available on prior imaging.  Plan: Patient would like to avoid opioids, however says that the Tylenol  codeine  is the only thing that provides her with acute relief Will refill 30 doses Tylenol  with codeine , patient to follow in 2 weeks with us  for reevaluation. Would likely need pain contract and tox assure for chronic opioids if plans are to continue Tylenol  with codeine  use. Reiterated importance of multi modal pain management with Tylenol , NSAIDs, Voltaren  gel. Will initiate colchicine  0.6 mg twice daily Patient not interested  in PT referral Orthopedics referral May benefit from x-rays with sunrise view in the future for assessment of the patellofemoral joint.    Patient seen with Dr. Lovie Trilby Europe MD [facility] Internal Medicine  PGY-1 Pager: [phone]  Phone: [phone] Date 02/27/2023  Time 1:34 PM

## 2023-02-27 NOTE — Patient Instructions (Addendum)
 Thank you, [name] for allowing us  to provide your care today.  Start the following medications: Meds ordered this encounter  Medications   diclofenac  Sodium (EQ ARTHRITIS PAIN RELIEVER) 1 % GEL    Sig: Apply 2 g topically 4 (four) times daily.    Dispense:  300 g    Refill:  3   colchicine  0.6 MG tablet    Sig: Take 1 tablet (0.6 mg total) by mouth 2 (two) times daily.    Dispense:  60 tablet    Refill:  2   acetaminophen -codeine  (TYLENOL  #3) 300-30 MG tablet    Sig: TAKE 1 TO 2 TABLETS BY MOUTH EVERY 8 HOURS AS NEEDED FOR MODERATE PAIN    Dispense:  30 tablet    Refill:  0     Follow up:  2 weeks  for knee pain  Please remember:  Take Tylenol  every six hours. You may take up to 3500mg  daily. Take up to 600mg  Ibuprofen every six hours. Use the Voltaren  gel 4 times a day Take the colcecine 0.6mg  twice a day  See us  in two weeks.    We look forward to seeing you next time. Please call our clinic at [phone] if you have any questions or concerns. The best time to call is Monday-Friday from 9am-4pm, but there is someone available 24/7. If after hours or the weekend, call the main hospital number and ask for the Internal Medicine Resident On-Call. If you need medication refills, please notify your pharmacy one week in advance and they will send us  a request.   Thank you for trusting me with your care. Wishing you the best!  Trilby Europe MD [facility]

## 2023-03-01 ENCOUNTER — Ambulatory Visit: Payer: Self-pay | Admitting: Licensed Clinical Social Worker

## 2023-03-01 NOTE — Progress Notes (Signed)
 Internal Medicine Clinic Attending  Case discussed with the resident at the time of the visit.  We reviewed the resident's history and exam and pertinent patient test results.  I agree with the assessment, diagnosis, and plan of care documented in the resident's note.

## 2023-03-01 NOTE — Patient Outreach (Signed)
  Care Coordination   03/01/2023 Name: [name] MRN: [id number] DOB: [birth year]   Care Coordination Outreach Attempts:  A second unsuccessful outreach was attempted today to offer the patient with information about available complex care management services.  Follow Up Plan:  Additional outreach attempts will be made to offer the patient complex care management information and services.   Encounter Outcome:  No Answer   Care Coordination Interventions:  No, not indicated    Alm Armor, LCSW [place], [facility] Licensed Clinical Social Worker Care Coordinator [phone]

## 2023-03-05 ENCOUNTER — Ambulatory Visit: Payer: Self-pay | Admitting: Licensed Clinical Social Worker

## 2023-03-05 NOTE — Patient Outreach (Signed)
  Care Coordination   Follow Up Visit Note   03/05/2023 Name: [name] MRN: [id number] DOB: February 27, 1950  [name] is a 73 y.o. year old female who sees Jackolyn Masker, MD for primary care. I spoke with  [name] by phone today.  What matters to the patients health and wellness today?  Maintaining my physical and mental health    Goals Addressed   None     SDOH assessments and interventions completed:  Yes     Care Coordination Interventions:  Yes, provided   Follow up plan: Follow up call scheduled for 04/02/2023    Encounter Outcome:  Patient Visit Completed   Hale Level, LCSW [place], [facility] Licensed Clinical Social Worker Care Coordinator [phone]

## 2023-03-05 NOTE — Patient Instructions (Addendum)
 Visit Information  Thank you for taking time to visit with me today. Please don't hesitate to contact me if I can be of assistance to you.   Following are the goals we discussed today:   Goals Addressed             This Visit's Progress    Reduce symptoms of depression       Care Coordination Interventions: Assessed Social Determinants of Health Reviewed all upcoming appointments in Epic system Motivational Interviewing employed Depression screen reviewed  Mindfulness or Relaxation training provided Active listening / Reflection utilized  Emotional Support Provided Behavioral Activation reviewed Review Behavioral Activation materials sent in the mail Consider counseling referral         Our next appointment is by telephone on 04/02/2023.  Please call the care guide team at [phone] if you need to cancel or reschedule your appointment.   If you are experiencing a Mental Health or Behavioral Health Crisis or need someone to talk to, please call the Suicide and Crisis Lifeline: 988  Patient verbalizes understanding of instructions and care plan provided today and agrees to view in MyChart. Active MyChart status and patient understanding of how to access instructions and care plan via MyChart confirmed with patient.     Telephone follow up appointment with care management team member scheduled for: 04/02/2023

## 2023-03-06 ENCOUNTER — Encounter: Payer: 59 | Admitting: Internal Medicine

## 2023-03-06 NOTE — Progress Notes (Deleted)
 Office Visit Note   Patient: [name]           Date of Birth: January 30, 1950           MRN: [id number] Visit Date: 03/07/2023              Requested by: Mercie Eon, MD [address] [place],  [place] [address] PCP: Gwenevere Abbot, MD   Assessment & Plan: Visit Diagnoses:  1. Primary osteoarthritis of right knee     Plan: ***  Follow-Up Instructions: No follow-ups on file.   Orders:  No orders of the defined types were placed in this encounter.  No orders of the defined types were placed in this encounter.     Procedures: No procedures performed   Clinical Data: No additional findings.   Subjective: No chief complaint on file.   HPI  Review of Systems   Objective: Vital Signs: There were no vitals taken for this visit.  Physical Exam  Ortho Exam  Specialty Comments:  MRI LUMBAR SPINE WITHOUT CONTRAST     TECHNIQUE:  Multiplanar, multisequence MR imaging of the lumbar spine was  performed. No intravenous contrast was administered.     COMPARISON:  08/28/2012     FINDINGS:  Segmentation: 5 lumbar type vertebrae based on the available  coverage     Alignment: Mild L3-4 anterolisthesis, chronic. Mild dextroscoliosis     Vertebrae:  No fracture, evidence of discitis, or bone lesion.     Conus medullaris and cauda equina: Conus extends to the L1-2 level.  Conus and cauda equina appear normal.     Paraspinal and other soft tissues: Negative for perispinal mass or  inflammation.     Disc levels:     T9-10 to T11-12 disc space narrowing and bulging with central  protrusions and facet spurring. Spinal stenosis especially at T10-11  where there is cord mass effect. At least moderate foraminal  narrowing on the right at T11-12.     T12- L1: Disc narrowing and bulging with small upward pointing  protrusion. Negative facets     L1-L2: Disc narrowing and bulging with upward migrating herniation.  Degenerative facet spurring  asymmetric to the left. Mild narrowing  of the thecal sac. Moderate left foraminal stenosis     L2-L3: Disc narrowing and bulging with right paracentral to  foraminal herniation. Degenerative facet spurring on both sides with  high-grade spinal and right foraminal stenosis     L3-L4: Disc narrowing and bulging. Facet spurring and ligamentum  flavum thickening with mild anterolisthesis. Compressive spinal  stenosis. Moderate right more than left foraminal narrowing     L4-L5: Disc collapse and endplate degeneration with disc bulging and  ridging. Bilateral facet spurring. Moderate bilateral foraminal  stenosis     L5-S1:Disc narrowing and bulging with central protrusion and  buttressing osteophytes impinging on the bilateral S1 nerve root.  Bilateral facet spurring. Bilateral foraminal impingement.     IMPRESSION:  1. Diffuse lumbar spine degeneration with generalized progression  from 2014.  2. Partially covered lower thoracic spine degeneration with T10-11  spinal stenosis and cord deformity. Please correlate for lower  thoracic myelopathy.  3. Compressive spinal stenosis at L2-3 and L3-4. Bilateral S1  impingement at the subarticular recesses of L5-S1.  4. Multilevel foraminal stenosis especially on the right at L2-3 and  below and on the left at L5-S1.        Electronically Signed    By: Audry Riles.D.  On: 01/29/2021 16:55  Imaging: No results found.   PMFS History: Patient Active Problem List   Diagnosis Date Noted  . Knee pain, right 01/17/2023  . Hypercalcemia 06/06/2022  . Spinal stenosis 02/24/2022  . Osteopenia 11/18/2021  . Vitamin D deficiency 04/18/2019  . Obesity (BMI 30-39.9) 03/09/2017  . Osteoarthritis of multiple joints 07/01/2014  . GERD (gastroesophageal reflux disease) 05/28/2014  . Pre-diabetes 12/17/2013  . Generalized anxiety disorder 02/19/2013  . Major depressive disorder, recurrent episode (HCC) 04/22/2012  . Routine adult health  maintenance 11/06/2011  . Hyperlipidemia 02/20/2008  . HYPERTENSION, BENIGN ESSENTIAL 10/23/2007  . Insomnia 03/28/2006   Past Medical History:  Diagnosis Date  . Anxiety   . Arthritis   . Chronic sinusitis 04/30/2013  . Depression   . Diverticulosis 04/19/2011   Dx on colonoscopy in 05/2006    . GERD (gastroesophageal reflux disease)   . PONV (postoperative nausea and vomiting)   . Vegetarian diet 08/18/2011    Family History  Problem Relation Age of Onset  . Cancer Mother 74       Lung  . Parkinsonism Father   . Dementia Father   . Drug abuse Brother   . Depression Brother     Past Surgical History:  Procedure Laterality Date  . BUNIONECTOMY     bilateral  . FRONTAL SINUS EXPLORATION Left 09/15/2014   Procedure: LEFT ENDOSCOPIC FRONTAL RECESS  EXPLORATION WITH FUSION IMAGE GUIDANCE NAVIGATION;  Surgeon: Newman Pies, MD;  Location: [facility];  Service: ENT;  Laterality: Left;  . SEPTOPLASTY Bilateral 10/20/2013   Procedure: SEPTOPLASTY;  Surgeon: Darletta Moll, MD;  Location: [facility];  Service: ENT;  Laterality: Bilateral;  . SINUS ENDO W/FUSION Bilateral 10/20/2013   Procedure: ENDOSCOPIC SINUS SURGERY WITH FUSION NAVIGATION, BILATERAL MAXILLARY ANTROSTOMIES, BILATERAL ETHMOIDECTOMIES, BILATERAL SPHENOIDECTOMIES, BILATERAL FRONTAL RECESS EXPLORATION;  Surgeon: Darletta Moll, MD;  Location: [facility];  Service: ENT;  Laterality: Bilateral;  . SINUS ENDO WITH FUSION Left 09/15/2014   Procedure: LEFT ENDOSCOPIC TOTAL ETHMOIDECTOMY WITH FUSION IMAGE GUIDANCE NAVIGATION;  Surgeon: Newman Pies, MD;  Location: [facility];  Service: ENT;  Laterality: Left;  . SPHENOIDECTOMY Right 09/15/2014   Procedure: RIGHT ENDOSCOPIC SPHENOIDECTOMY WITH FUSION IMAGE GUIDANCE NAVIGATION;  Surgeon: Newman Pies, MD;  Location:  [facility];  Service: ENT;  Laterality: Right;   Social History   Occupational History  . Not on file  Tobacco Use   . Smoking status: Never  . Smokeless tobacco: Never  Vaping Use  . Vaping status: Never Used  Substance and Sexual Activity  . Alcohol use: Not Currently    Alcohol/week: 0.0 standard drinks of alcohol  . Drug use: No  . Sexual activity: Not Currently

## 2023-03-07 ENCOUNTER — Encounter: Payer: 59 | Admitting: Orthopaedic Surgery

## 2023-03-07 DIAGNOSIS — M1711 Unilateral primary osteoarthritis, right knee: Secondary | ICD-10-CM

## 2023-03-16 ENCOUNTER — Encounter: Payer: 59 | Admitting: Orthopaedic Surgery

## 2023-03-16 ENCOUNTER — Telehealth: Payer: Self-pay | Admitting: *Deleted

## 2023-03-16 NOTE — Telephone Encounter (Signed)
Call from patient cancelling appointment for Tuesday 03/20/2023.  States was exposed to Covid and things she may have. Has not taken a Covid Test.  States is having headaches, cough and a sore throat.  Patient is not having fevers.  Cough is yellow mucous.  States will like to know what else she can take or do for the symptoms.  Does not want to come in as she feels bad today.  Will go to an Urgent Care if she feels worse later today.

## 2023-03-20 ENCOUNTER — Encounter: Payer: 59 | Admitting: Internal Medicine

## 2023-03-22 ENCOUNTER — Other Ambulatory Visit: Payer: Self-pay | Admitting: Student

## 2023-03-22 ENCOUNTER — Other Ambulatory Visit: Payer: Self-pay | Admitting: Internal Medicine

## 2023-03-22 DIAGNOSIS — M150 Primary generalized (osteo)arthritis: Secondary | ICD-10-CM

## 2023-03-22 DIAGNOSIS — G8929 Other chronic pain: Secondary | ICD-10-CM

## 2023-03-22 DIAGNOSIS — F330 Major depressive disorder, recurrent, mild: Secondary | ICD-10-CM

## 2023-03-22 MED ORDER — ALPRAZOLAM 0.5 MG PO TABS: ORAL_TABLET | ORAL | 0 refills | Status: DC

## 2023-03-22 NOTE — Telephone Encounter (Signed)
 Unable to refill medication, rx will not let me send as normal rx keeps printing.

## 2023-03-22 NOTE — Telephone Encounter (Signed)
 Medication sent to pharmacy

## 2023-03-22 NOTE — Addendum Note (Signed)
 Addended byCala Bradford on: 03/22/2023 01:30 PM   Modules accepted: Orders

## 2023-03-23 NOTE — Telephone Encounter (Signed)
 Patient called she is requesting a 90 day supply due to her pain level. Patient stated her pain is high to the point where she is unable to get out of bed and she can't walk some days.

## 2023-03-28 ENCOUNTER — Telehealth: Payer: Self-pay | Admitting: *Deleted

## 2023-03-28 NOTE — Telephone Encounter (Signed)
 Communication  Reason for CRM: Rashika from [employer] is looking to see if we received a fax regarding opoid management for this patient.        Agent confirmed the insurance company does in fact have the correct fax number for the clinic and were instructed to send it over a second time for a response due to the first attempt being made in January.        Medication in question: ALPRAZolam Prudy Feeler) 0.5 MG tablet        Contact Information :        Radio producer from [employer]        Ph: [phone]    Fax: [phone]

## 2023-03-28 NOTE — Telephone Encounter (Signed)
Please refill xanax

## 2023-03-28 NOTE — Telephone Encounter (Signed)
 Hme, front office, state we have not received a fax from [facility].

## 2023-04-02 ENCOUNTER — Ambulatory Visit: Payer: Self-pay | Admitting: Licensed Clinical Social Worker

## 2023-04-02 NOTE — Progress Notes (Unsigned)
 Office Visit Note   Patient: [name]           Date of Birth: March 28, 1950           MRN: [id number] Visit Date: 04/03/2023              Requested by: Mercie Eon, MD [address] [place],  [place] [address] PCP: Gwenevere Abbot, MD   Assessment & Plan: Visit Diagnoses:  No diagnosis found.   Plan: ***  Follow-Up Instructions: No follow-ups on file.   Orders:  No orders of the defined types were placed in this encounter.  No orders of the defined types were placed in this encounter.     Procedures: No procedures performed   Clinical Data: No additional findings.   Subjective: No chief complaint on file.   HPI  Review of Systems   Objective: Vital Signs: There were no vitals taken for this visit.  Physical Exam  Ortho Exam  Specialty Comments:  MRI LUMBAR SPINE WITHOUT CONTRAST     TECHNIQUE:  Multiplanar, multisequence MR imaging of the lumbar spine was  performed. No intravenous contrast was administered.     COMPARISON:  08/28/2012     FINDINGS:  Segmentation: 5 lumbar type vertebrae based on the available  coverage     Alignment: Mild L3-4 anterolisthesis, chronic. Mild dextroscoliosis     Vertebrae:  No fracture, evidence of discitis, or bone lesion.     Conus medullaris and cauda equina: Conus extends to the L1-2 level.  Conus and cauda equina appear normal.     Paraspinal and other soft tissues: Negative for perispinal mass or  inflammation.     Disc levels:     T9-10 to T11-12 disc space narrowing and bulging with central  protrusions and facet spurring. Spinal stenosis especially at T10-11  where there is cord mass effect. At least moderate foraminal  narrowing on the right at T11-12.     T12- L1: Disc narrowing and bulging with small upward pointing  protrusion. Negative facets     L1-L2: Disc narrowing and bulging with upward migrating herniation.  Degenerative facet spurring asymmetric to the left.  Mild narrowing  of the thecal sac. Moderate left foraminal stenosis     L2-L3: Disc narrowing and bulging with right paracentral to  foraminal herniation. Degenerative facet spurring on both sides with  high-grade spinal and right foraminal stenosis     L3-L4: Disc narrowing and bulging. Facet spurring and ligamentum  flavum thickening with mild anterolisthesis. Compressive spinal  stenosis. Moderate right more than left foraminal narrowing     L4-L5: Disc collapse and endplate degeneration with disc bulging and  ridging. Bilateral facet spurring. Moderate bilateral foraminal  stenosis     L5-S1:Disc narrowing and bulging with central protrusion and  buttressing osteophytes impinging on the bilateral S1 nerve root.  Bilateral facet spurring. Bilateral foraminal impingement.     IMPRESSION:  1. Diffuse lumbar spine degeneration with generalized progression  from 2014.  2. Partially covered lower thoracic spine degeneration with T10-11  spinal stenosis and cord deformity. Please correlate for lower  thoracic myelopathy.  3. Compressive spinal stenosis at L2-3 and L3-4. Bilateral S1  impingement at the subarticular recesses of L5-S1.  4. Multilevel foraminal stenosis especially on the right at L2-3 and  below and on the left at L5-S1.        Electronically Signed    By: Tiburcio Pea M.D.    On: 01/29/2021  16:55  Imaging: No results found.   PMFS History: Patient Active Problem List   Diagnosis Date Noted   Knee pain, right 01/17/2023   Hypercalcemia 06/06/2022   Spinal stenosis 02/24/2022   Osteopenia 11/18/2021   Vitamin D deficiency 04/18/2019   Obesity (BMI 30-39.9) 03/09/2017   Osteoarthritis of multiple joints 07/01/2014   GERD (gastroesophageal reflux disease) 05/28/2014   Pre-diabetes 12/17/2013   Generalized anxiety disorder 02/19/2013   Major depressive disorder, recurrent episode (HCC) 04/22/2012   Routine adult health maintenance 11/06/2011    Hyperlipidemia 02/20/2008   HYPERTENSION, BENIGN ESSENTIAL 10/23/2007   Insomnia 03/28/2006   Past Medical History:  Diagnosis Date   Anxiety    Arthritis    Chronic sinusitis 04/30/2013   Depression    Diverticulosis 04/19/2011   Dx on colonoscopy in 05/2006     GERD (gastroesophageal reflux disease)    PONV (postoperative nausea and vomiting)    Vegetarian diet 08/18/2011    Family History  Problem Relation Age of Onset   Cancer Mother 10       Lung   Parkinsonism Father    Dementia Father    Drug abuse Brother    Depression Brother     Past Surgical History:  Procedure Laterality Date   BUNIONECTOMY     bilateral   FRONTAL SINUS EXPLORATION Left 09/15/2014   Procedure: LEFT ENDOSCOPIC FRONTAL RECESS  EXPLORATION WITH FUSION IMAGE GUIDANCE NAVIGATION;  Surgeon: Newman Pies, MD;  Location: [facility];  Service: ENT;  Laterality: Left;   SEPTOPLASTY Bilateral 10/20/2013   Procedure: SEPTOPLASTY;  Surgeon: Darletta Moll, MD;  Location: [facility];  Service: ENT;  Laterality: Bilateral;   SINUS ENDO W/FUSION Bilateral 10/20/2013   Procedure: ENDOSCOPIC SINUS SURGERY WITH FUSION NAVIGATION, BILATERAL MAXILLARY ANTROSTOMIES, BILATERAL ETHMOIDECTOMIES, BILATERAL SPHENOIDECTOMIES, BILATERAL FRONTAL RECESS EXPLORATION;  Surgeon: Darletta Moll, MD;  Location: [facility];  Service: ENT;  Laterality: Bilateral;   SINUS ENDO WITH FUSION Left 09/15/2014   Procedure: LEFT ENDOSCOPIC TOTAL ETHMOIDECTOMY WITH FUSION IMAGE GUIDANCE NAVIGATION;  Surgeon: Newman Pies, MD;  Location: [facility];  Service: ENT;  Laterality: Left;   SPHENOIDECTOMY Right 09/15/2014   Procedure: RIGHT ENDOSCOPIC SPHENOIDECTOMY WITH FUSION IMAGE GUIDANCE NAVIGATION;  Surgeon: Newman Pies, MD;  Location: [facility];  Service: ENT;  Laterality: Right;   Social History   Occupational History   Not on file  Tobacco Use   Smoking status: Never   Smokeless tobacco:  Never  Vaping Use   Vaping status: Never Used  Substance and Sexual Activity   Alcohol use: Not Currently    Alcohol/week: 0.0 standard drinks of alcohol   Drug use: No   Sexual activity: Not Currently

## 2023-04-02 NOTE — Patient Outreach (Signed)
 Care Coordination   04/02/2023 Name: [name] MRN: [id number] DOB: 18-Dec-1950   Care Coordination Outreach Attempts:  An unsuccessful telephone outreach was attempted today to offer the patient information about available complex care management services.  Follow Up Plan:  Additional outreach attempts will be made to offer the patient complex care management information and services.   Encounter Outcome:  No Answer   Care Coordination Interventions:  No, not indicated    Kenton Kingfisher, LCSW [facility], [facility] Licensed Clinical Social Worker Care Coordinator [phone]

## 2023-04-03 ENCOUNTER — Encounter: Payer: 59 | Admitting: Orthopaedic Surgery

## 2023-04-04 NOTE — Telephone Encounter (Signed)
 Front office received the form. Form placed in the Yellow Team's box.

## 2023-04-09 ENCOUNTER — Other Ambulatory Visit: Payer: Self-pay | Admitting: Internal Medicine

## 2023-04-09 ENCOUNTER — Other Ambulatory Visit: Payer: Self-pay | Admitting: Student

## 2023-04-09 DIAGNOSIS — M150 Primary generalized (osteo)arthritis: Secondary | ICD-10-CM

## 2023-04-09 DIAGNOSIS — F330 Major depressive disorder, recurrent, mild: Secondary | ICD-10-CM

## 2023-04-09 DIAGNOSIS — G8929 Other chronic pain: Secondary | ICD-10-CM

## 2023-04-10 ENCOUNTER — Ambulatory Visit (INDEPENDENT_AMBULATORY_CARE_PROVIDER_SITE_OTHER): Admitting: Orthopaedic Surgery

## 2023-04-10 VITALS — Ht 61.0 in | Wt 164.0 lb

## 2023-04-10 DIAGNOSIS — M150 Primary generalized (osteo)arthritis: Secondary | ICD-10-CM

## 2023-04-10 DIAGNOSIS — M25561 Pain in right knee: Secondary | ICD-10-CM | POA: Diagnosis not present

## 2023-04-10 DIAGNOSIS — G8929 Other chronic pain: Secondary | ICD-10-CM

## 2023-04-10 MED ORDER — ACETAMINOPHEN-CODEINE 300-30 MG PO TABS: 1.0000 | ORAL_TABLET | Freq: Every day | ORAL | 0 refills | Status: DC | PRN

## 2023-04-10 MED ORDER — LIDOCAINE HCL 1 % IJ SOLN
2.0000 mL | INTRAMUSCULAR | Status: AC | PRN
  Administered 2023-04-10: 2 mL

## 2023-04-10 MED ORDER — METHYLPREDNISOLONE ACETATE 40 MG/ML IJ SUSP
40.0000 mg | INTRAMUSCULAR | Status: AC | PRN
  Administered 2023-04-10: 40 mg via INTRA_ARTICULAR

## 2023-04-10 MED ORDER — BUPIVACAINE HCL 0.5 % IJ SOLN
2.0000 mL | INTRAMUSCULAR | Status: AC | PRN
  Administered 2023-04-10: 2 mL via INTRA_ARTICULAR

## 2023-04-10 NOTE — Progress Notes (Signed)
 Office Visit Note   Patient: [name]           Date of Birth: Jan 07, 1951           MRN: [id number] Visit Date: 04/10/2023              Requested by: Mercie Eon, MD [address] [place],  [place] [address] PCP: Gwenevere Abbot, MD   Assessment & Plan: Visit Diagnoses:  1. Chronic pain of right knee   2. Primary osteoarthritis involving multiple joints     Plan: Patient is a 73 year old female with right knee pain due to DJD and chondrocalcinosis.  Treatment options discussed and disease process explained.  Patient elected to undergo cortisone injection today.  She tolerates well.  Will see her back as needed.  Follow-Up Instructions: No follow-ups on file.   Orders:  Orders Placed This Encounter  Procedures   Large Joint Inj   Meds ordered this encounter  Medications   acetaminophen-codeine (TYLENOL #3) 300-30 MG tablet    Sig: Take 1-2 tablets by mouth daily as needed for moderate pain (pain score 4-6). TAKE 1 TO 2 TABLETS BY MOUTH EVERY 8 HOURS AS NEEDED FOR MODERATE PAIN    Dispense:  10 tablet    Refill:  0      Procedures: Large Joint Inj: R knee on 04/10/2023 8:08 PM Indications: pain Details: 22 G needle  Arthrogram: No  Medications: 40 mg methylPREDNISolone acetate 40 MG/ML; 2 mL lidocaine 1 %; 2 mL bupivacaine 0.5 % Consent was given by the patient. Patient was prepped and draped in the usual sterile fashion.       Clinical Data: No additional findings.   Subjective: Chief Complaint  Patient presents with   Right Knee - Pain    HPI Patient is a 73 year old female here for evaluation of chronic right knee pain.  She has had this for years but recently this has gotten worse.  Reports swelling popping and cracking when bending her knee.  Denies any injuries. Review of Systems  Constitutional: Negative.   HENT: Negative.    Eyes: Negative.   Respiratory: Negative.    Cardiovascular: Negative.   Endocrine: Negative.    Musculoskeletal: Negative.   Neurological: Negative.   Hematological: Negative.   Psychiatric/Behavioral: Negative.    All other systems reviewed and are negative.    Objective: Vital Signs: Ht 5\' 1"  (1.549 m)   Wt 164 lb (74.4 kg)   BMI 30.99 kg/m   Physical Exam Vitals and nursing note reviewed.  Constitutional:      Appearance: She is well-developed.  HENT:     Head: Atraumatic.     Nose: Nose normal.  Eyes:     Extraocular Movements: Extraocular movements intact.  Cardiovascular:     Pulses: Normal pulses.  Pulmonary:     Effort: Pulmonary effort is normal.  Abdominal:     Palpations: Abdomen is soft.  Musculoskeletal:     Cervical back: Neck supple.  Skin:    General: Skin is warm.     Capillary Refill: Capillary refill takes less than 2 seconds.  Neurological:     Mental Status: She is alert. Mental status is at baseline.  Psychiatric:        Behavior: Behavior normal.        Thought Content: Thought content normal.        Judgment: Judgment normal.     Ortho Exam Examination of the right knee shows trace  effusion.  Otherwise exam is normal. Specialty Comments:  MRI LUMBAR SPINE WITHOUT CONTRAST     TECHNIQUE:  Multiplanar, multisequence MR imaging of the lumbar spine was  performed. No intravenous contrast was administered.     COMPARISON:  08/28/2012     FINDINGS:  Segmentation: 5 lumbar type vertebrae based on the available  coverage     Alignment: Mild L3-4 anterolisthesis, chronic. Mild dextroscoliosis     Vertebrae:  No fracture, evidence of discitis, or bone lesion.     Conus medullaris and cauda equina: Conus extends to the L1-2 level.  Conus and cauda equina appear normal.     Paraspinal and other soft tissues: Negative for perispinal mass or  inflammation.     Disc levels:     T9-10 to T11-12 disc space narrowing and bulging with central  protrusions and facet spurring. Spinal stenosis especially at T10-11  where there is cord  mass effect. At least moderate foraminal  narrowing on the right at T11-12.     T12- L1: Disc narrowing and bulging with small upward pointing  protrusion. Negative facets     L1-L2: Disc narrowing and bulging with upward migrating herniation.  Degenerative facet spurring asymmetric to the left. Mild narrowing  of the thecal sac. Moderate left foraminal stenosis     L2-L3: Disc narrowing and bulging with right paracentral to  foraminal herniation. Degenerative facet spurring on both sides with  high-grade spinal and right foraminal stenosis     L3-L4: Disc narrowing and bulging. Facet spurring and ligamentum  flavum thickening with mild anterolisthesis. Compressive spinal  stenosis. Moderate right more than left foraminal narrowing     L4-L5: Disc collapse and endplate degeneration with disc bulging and  ridging. Bilateral facet spurring. Moderate bilateral foraminal  stenosis     L5-S1:Disc narrowing and bulging with central protrusion and  buttressing osteophytes impinging on the bilateral S1 nerve root.  Bilateral facet spurring. Bilateral foraminal impingement.     IMPRESSION:  1. Diffuse lumbar spine degeneration with generalized progression  from 2014.  2. Partially covered lower thoracic spine degeneration with T10-11  spinal stenosis and cord deformity. Please correlate for lower  thoracic myelopathy.  3. Compressive spinal stenosis at L2-3 and L3-4. Bilateral S1  impingement at the subarticular recesses of L5-S1.  4. Multilevel foraminal stenosis especially on the right at L2-3 and  below and on the left at L5-S1.        Electronically Signed    By: Tiburcio Pea M.D.    On: 01/29/2021 16:55  Imaging: No results found.   PMFS History: Patient Active Problem List   Diagnosis Date Noted   Knee pain, right 01/17/2023   Hypercalcemia 06/06/2022   Spinal stenosis 02/24/2022   Osteopenia 11/18/2021   Vitamin D deficiency 04/18/2019   Obesity (BMI 30-39.9)  03/09/2017   Osteoarthritis of multiple joints 07/01/2014   GERD (gastroesophageal reflux disease) 05/28/2014   Pre-diabetes 12/17/2013   Generalized anxiety disorder 02/19/2013   Major depressive disorder, recurrent episode (HCC) 04/22/2012   Routine adult health maintenance 11/06/2011   Hyperlipidemia 02/20/2008   HYPERTENSION, BENIGN ESSENTIAL 10/23/2007   Insomnia 03/28/2006   Past Medical History:  Diagnosis Date   Anxiety    Arthritis    Chronic sinusitis 04/30/2013   Depression    Diverticulosis 04/19/2011   Dx on colonoscopy in 05/2006     GERD (gastroesophageal reflux disease)    PONV (postoperative nausea and vomiting)    Vegetarian diet 08/18/2011  Family History  Problem Relation Age of Onset   Cancer Mother 74       Lung   Parkinsonism Father    Dementia Father    Drug abuse Brother    Depression Brother     Past Surgical History:  Procedure Laterality Date   BUNIONECTOMY     bilateral   FRONTAL SINUS EXPLORATION Left 09/15/2014   Procedure: LEFT ENDOSCOPIC FRONTAL RECESS  EXPLORATION WITH FUSION IMAGE GUIDANCE NAVIGATION;  Surgeon: Newman Pies, MD;  Location: [facility];  Service: ENT;  Laterality: Left;   SEPTOPLASTY Bilateral 10/20/2013   Procedure: SEPTOPLASTY;  Surgeon: Darletta Moll, MD;  Location: [facility];  Service: ENT;  Laterality: Bilateral;   SINUS ENDO W/FUSION Bilateral 10/20/2013   Procedure: ENDOSCOPIC SINUS SURGERY WITH FUSION NAVIGATION, BILATERAL MAXILLARY ANTROSTOMIES, BILATERAL ETHMOIDECTOMIES, BILATERAL SPHENOIDECTOMIES, BILATERAL FRONTAL RECESS EXPLORATION;  Surgeon: Darletta Moll, MD;  Location: [facility];  Service: ENT;  Laterality: Bilateral;   SINUS ENDO WITH FUSION Left 09/15/2014   Procedure: LEFT ENDOSCOPIC TOTAL ETHMOIDECTOMY WITH FUSION IMAGE GUIDANCE NAVIGATION;  Surgeon: Newman Pies, MD;  Location: [facility];  Service: ENT;  Laterality: Left;   SPHENOIDECTOMY Right 09/15/2014    Procedure: RIGHT ENDOSCOPIC SPHENOIDECTOMY WITH FUSION IMAGE GUIDANCE NAVIGATION;  Surgeon: Newman Pies, MD;  Location: [facility];  Service: ENT;  Laterality: Right;   Social History   Occupational History   Not on file  Tobacco Use   Smoking status: Never   Smokeless tobacco: Never  Vaping Use   Vaping status: Never Used  Substance and Sexual Activity   Alcohol use: Not Currently    Alcohol/week: 0.0 standard drinks of alcohol   Drug use: No   Sexual activity: Not Currently

## 2023-04-11 NOTE — Telephone Encounter (Signed)
 Discussed with patient that we need to work on slowly tapering this off in the future. Plan to further discuss this at an in-person follow up visit. Will refill for now.

## 2023-04-17 ENCOUNTER — Other Ambulatory Visit: Payer: Self-pay | Admitting: Internal Medicine

## 2023-04-17 ENCOUNTER — Encounter: Payer: Self-pay | Admitting: Internal Medicine

## 2023-04-17 ENCOUNTER — Ambulatory Visit (INDEPENDENT_AMBULATORY_CARE_PROVIDER_SITE_OTHER): Payer: 59 | Admitting: Internal Medicine

## 2023-04-17 VITALS — BP 143/65 | HR 75 | Temp 97.9°F | Ht 61.0 in | Wt 160.0 lb

## 2023-04-17 DIAGNOSIS — E559 Vitamin D deficiency, unspecified: Secondary | ICD-10-CM | POA: Diagnosis not present

## 2023-04-17 DIAGNOSIS — E785 Hyperlipidemia, unspecified: Secondary | ICD-10-CM | POA: Diagnosis not present

## 2023-04-17 DIAGNOSIS — Z6830 Body mass index (BMI) 30.0-30.9, adult: Secondary | ICD-10-CM

## 2023-04-17 DIAGNOSIS — K8020 Calculus of gallbladder without cholecystitis without obstruction: Secondary | ICD-10-CM | POA: Diagnosis not present

## 2023-04-17 DIAGNOSIS — G8929 Other chronic pain: Secondary | ICD-10-CM

## 2023-04-17 DIAGNOSIS — F332 Major depressive disorder, recurrent severe without psychotic features: Secondary | ICD-10-CM | POA: Diagnosis not present

## 2023-04-17 DIAGNOSIS — E669 Obesity, unspecified: Secondary | ICD-10-CM

## 2023-04-17 DIAGNOSIS — I10 Essential (primary) hypertension: Secondary | ICD-10-CM | POA: Diagnosis not present

## 2023-04-17 DIAGNOSIS — Z1231 Encounter for screening mammogram for malignant neoplasm of breast: Secondary | ICD-10-CM

## 2023-04-17 DIAGNOSIS — E7800 Pure hypercholesterolemia, unspecified: Secondary | ICD-10-CM

## 2023-04-17 DIAGNOSIS — M150 Primary generalized (osteo)arthritis: Secondary | ICD-10-CM

## 2023-04-17 DIAGNOSIS — F411 Generalized anxiety disorder: Secondary | ICD-10-CM

## 2023-04-17 DIAGNOSIS — M25561 Pain in right knee: Secondary | ICD-10-CM

## 2023-04-17 DIAGNOSIS — Z1211 Encounter for screening for malignant neoplasm of colon: Secondary | ICD-10-CM | POA: Insufficient documentation

## 2023-04-17 DIAGNOSIS — F330 Major depressive disorder, recurrent, mild: Secondary | ICD-10-CM

## 2023-04-17 MED ORDER — DULOXETINE HCL 60 MG PO CPEP: 60.0000 mg | ORAL_CAPSULE | Freq: Every day | ORAL | 11 refills | Status: DC

## 2023-04-17 NOTE — Assessment & Plan Note (Addendum)
 Chronic and stable. She underwent right knee steroid injection with Dr. Roda Shutters on 3/18. She noted significant improvement in pain for 2-3 days, now worse again with long distance walking.  She has received Tylenol #3 for many years and takes 1-2 daily as needed. Also using ibuprofen 800 mg every 4-6 hours as needed, Voltaren gel, and pregabalin 75 mg bid. On medication review, we also found a bottle of meloxicam in her medication bag. Colchicine started at prior visit, no significant benefit so will stop today. Previously not interested in physical therapy. Not interested in exercise classes today, reviewed in-person and Zoom options. She does walk her dog.   Plan -Stop meloxicam; reviewed recommended maximum daily ibuprofen dosage and GI protection with omeprazole -Stop colchicine -Duloxetine to 60 mg daily -Renewed controlled substance agreement for Tylenol w/ codeine; unable to obtain [employer] today, next visit; discussed reduced number of pills from 80 > 60/month

## 2023-04-17 NOTE — Assessment & Plan Note (Addendum)
 Mild elevation in BP, 143/65. She continues to take amlodipine 2.5 mg daily. BP within goal at prior visits. We have discussed home monitoring, call if consistently >140-150/90.   Plan -Continue amlodipine 2.5 mg daily -Home monitoring -BMP

## 2023-04-17 NOTE — Assessment & Plan Note (Addendum)
 PHQ9 15 today, slowly decreasing over prior visits (23 > 17 > 15). Medication review today with both escitalopram and duloxetine in medication bag, discussed continuing duloxetine monotherapy. Duloxetine with the added benefit of pain treatment. Will increase to 60 mg today. Not interested in behavioral counseling at this time, social activities, or other groups. Senior center activities provided as an option. No active SI. She does occasionally have passive SI with her dog being a protective factor. Encouraged her to call with any change.  Plan -Increase duloxetine to 60 mg daily -Consider IBH

## 2023-04-17 NOTE — Assessment & Plan Note (Addendum)
 Vitamin D not in patient's medication supply at today's visit. Recheck levels at next visit.

## 2023-04-17 NOTE — Patient Instructions (Addendum)
 It was wonderful to see you today!  Medication changes: #1. Increase duloxetine (Cymbalta) to 60 mg daily (take two 30 mg tablets until you get the new prescription) #2. STOP meloxicam #3. STOP colchicine #4. Maximum daily ibuprofen 3200 mg/day, no more than 800 mg every 6 hours  *For exercise classes (in-person and online), group activities, and more*            [email or web address]  Please contact your pharmacy about scheduling the shingles (Shingrix) vaccine.

## 2023-04-17 NOTE — Assessment & Plan Note (Signed)
 ASCVD score of 22% based on lipids in 2024. Will need to discuss repeat at f/u and consideration of statin therapy.

## 2023-04-17 NOTE — Progress Notes (Signed)
 Copy of [facility] Controlled Medication Treatment Agreement given to pt.

## 2023-04-17 NOTE — Assessment & Plan Note (Signed)
 Incidentally noted on remote imaging. Asymptomatic.

## 2023-04-17 NOTE — Progress Notes (Signed)
 Established Patient Office Visit  Subjective   Patient ID: [name], female    DOB: 12/20/1950  Age: 73 y.o. MRN: [id number]  Chief Complaint  Patient presents with   Medical Management of Chronic Issues    Right leg pain.    Ms. Srinivasan returns to clinic today for f/u of chronic medical conditions and first visit with me. Please see assessment/plan in problem-based charting for further details of today's visit.    Patient Active Problem List   Diagnosis Date Noted   Screening for colon cancer 04/17/2023   Cholelithiases 04/17/2023   Spinal stenosis 02/24/2022   Osteopenia 11/18/2021   Vitamin D deficiency 04/18/2019   Obesity (BMI 30-39.9) 03/09/2017   Knee pain, right 07/01/2014   GERD (gastroesophageal reflux disease) 05/28/2014   Pre-diabetes 12/17/2013   Generalized anxiety disorder 02/19/2013   Moderately severe recurrent major depression (HCC) 04/22/2012   Hyperlipidemia 02/20/2008   HYPERTENSION, BENIGN ESSENTIAL 10/23/2007   Insomnia 03/28/2006      Objective:     BP (!) 143/65 (BP Location: Left Arm, Patient Position: Sitting, Cuff Size: Normal)   Pulse 75   Temp 97.9 F (36.6 C) (Oral)   Ht 5\' 1"  (1.549 m)   Wt 160 lb (72.6 kg)   SpO2 100% Comment: RA  BMI 30.23 kg/m  BP Readings from Last 3 Encounters:  04/17/23 (!) 143/65  02/27/23 (!) 169/102  01/15/23 116/69   Wt Readings from Last 3 Encounters:  04/17/23 160 lb (72.6 kg)  04/10/23 164 lb (74.4 kg)  02/27/23 168 lb 4.8 oz (76.3 kg)    Physical Exam Constitutional:      General: She is not in acute distress.    Appearance: Normal appearance. She is not ill-appearing.  Pulmonary:     Effort: Pulmonary effort is normal.  Musculoskeletal:     Right lower leg: No edema.     Left lower leg: No edema.  Neurological:     General: No focal deficit present.     Mental Status: She is alert.     Gait: Gait abnormal (antalgic gait).  Psychiatric:        Mood and Affect: Mood  normal.        Behavior: Behavior normal.       Assessment & Plan:   Problem List Items Addressed This Visit       Cardiovascular and Mediastinum   HYPERTENSION, BENIGN ESSENTIAL (Chronic)   Mild elevation in BP, 143/65. She continues to take amlodipine 2.5 mg daily. BP within goal at prior visits. We have discussed home monitoring, call if consistently >140-150/90.   Plan -Continue amlodipine 2.5 mg daily -Home monitoring -BMP      Relevant Medications   amLODipine (NORVASC) 2.5 MG tablet   Other Relevant Orders   BMP8+Anion Gap     Digestive   Cholelithiases   Incidentally noted on remote imaging. Asymptomatic.        Other   Hyperlipidemia (Chronic)   ASCVD score of 22% based on lipids in 2024. Will need to discuss repeat at f/u and consideration of statin therapy.       Relevant Medications   amLODipine (NORVASC) 2.5 MG tablet   Obesity (BMI 30-39.9) (Chronic)   Walking the dog daily. Discussed increased activity and exercise classes via Zoom, declines for now. I asked if she was interested in making dietary changes and she politely declined.      Vitamin D deficiency (Chronic)   Vitamin D not in  patient's medication supply at today's visit. Recheck levels at next visit.      Knee pain, right - Primary (Chronic)   Chronic and stable. She underwent right knee steroid injection with Dr. Roda Shutters on 3/18. She noted significant improvement in pain for 2-3 days, now worse again with long distance walking.  She has received Tylenol #3 for many years and takes 1-2 daily as needed. Also using ibuprofen 800 mg every 4-6 hours as needed, Voltaren gel, and pregabalin 75 mg bid. On medication review, we also found a bottle of meloxicam in her medication bag. Colchicine started at prior visit, no significant benefit so will stop today. Previously not interested in physical therapy. Not interested in exercise classes today, reviewed in-person and Zoom options. She does walk her  dog.   Plan -Stop meloxicam; reviewed recommended maximum daily ibuprofen dosage and GI protection with omeprazole -Stop colchicine -Duloxetine to 60 mg daily -Renewed controlled substance agreement for Tylenol w/ codeine; unable to obtain ToxAssure today, next visit; discussed reduced number of pills from 80 > 60/month       Relevant Medications   DULoxetine (CYMBALTA) 60 MG capsule   Moderately severe recurrent major depression (HCC)   PHQ9 15 today, slowly decreasing over prior visits (23 > 17 > 15). Medication review today with both escitalopram and duloxetine in medication bag, discussed continuing duloxetine monotherapy. Duloxetine with the added benefit of pain treatment. Will increase to 60 mg today. Not interested in behavioral counseling at this time, social activities, or other groups. Senior center activities provided as an option. No active SI. She does occasionally have passive SI with her dog being a protective factor. Encouraged her to call with any change.  Plan -Increase duloxetine to 60 mg daily -Consider IBH        Relevant Medications   DULoxetine (CYMBALTA) 60 MG capsule   Generalized anxiety disorder   Chronic. Has been on long-term alprazolam, taking one at night prn. Sometimes needs it during the day when going out. We have updated a controlled substance agreement today and will obtain Utox at next appointment (unable to provide urine today). We have discussed decreasing the number of tablets with each fill from 120 > 60. Will discuss further ability to taper at future visits. She has declined referral to behavioral health.      Relevant Medications   DULoxetine (CYMBALTA) 60 MG capsule   Screening for colon cancer   Discussed colon cancer screening, Cologuard ordered at prior visit. Ms. Mish notes she is not interested in screening. If she were to get colon cancer she would not want treatment. She understands the risks of dying from cancer and does not  wish to proceed with any further screening. HM tab updated.      Other Visit Diagnoses       Major depressive disorder, recurrent episode, mild (HCC)  (Chronic)      Relevant Medications   DULoxetine (CYMBALTA) 60 MG capsule     Screening mammogram for breast cancer       Relevant Orders   MM 3D SCREENING MAMMOGRAM BILATERAL BREAST       Return in about 3 months (around 07/18/2023).    Dickie La, MD

## 2023-04-17 NOTE — Assessment & Plan Note (Signed)
 Discussed colon cancer screening, Cologuard ordered at prior visit. [name] notes she is not interested in screening. If she were to get colon cancer she would not want treatment. She understands the risks of dying from cancer and does not wish to proceed with any further screening. HM tab updated.

## 2023-04-17 NOTE — Assessment & Plan Note (Signed)
 Walking the dog daily. Discussed increased activity and exercise classes via Zoom, declines for now. I asked if she was interested in making dietary changes and she politely declined.

## 2023-04-17 NOTE — Assessment & Plan Note (Signed)
 Chronic. Has been on long-term alprazolam, taking one at night prn. Sometimes needs it during the day when going out. We have updated a controlled substance agreement today and will obtain Utox at next appointment (unable to provide urine today). We have discussed decreasing the number of tablets with each fill from 120 > 60. Will discuss further ability to taper at future visits. She has declined referral to behavioral health.

## 2023-04-18 ENCOUNTER — Encounter: Payer: Self-pay | Admitting: Internal Medicine

## 2023-04-18 LAB — BMP8+ANION GAP
Anion Gap: 17 mmol/L (ref 10.0–18.0)
BUN/Creatinine Ratio: 22 (ref 12–28)
BUN: 17 mg/dL (ref 8–27)
CO2: 21 mmol/L (ref 20–29)
Calcium: 10.9 mg/dL — ABNORMAL HIGH (ref 8.7–10.3)
Chloride: 103 mmol/L (ref 96–106)
Creatinine, Ser: 0.76 mg/dL (ref 0.57–1.00)
Glucose: 101 mg/dL — ABNORMAL HIGH (ref 70–99)
Potassium: 3.5 mmol/L (ref 3.5–5.2)
Sodium: 141 mmol/L (ref 134–144)
eGFR: 82 mL/min/{1.73_m2} (ref >59)

## 2023-04-18 NOTE — Progress Notes (Signed)
 Mild elevation in calcium. Previously had normalized. Will repeat with PTH and vit D at f/u visit.

## 2023-04-20 ENCOUNTER — Telehealth: Payer: Self-pay

## 2023-04-20 NOTE — Telephone Encounter (Signed)
 Patient called, recording this call could not be completed as dialed, check your operator for assistance. Will route to the office.  Copied from CRM [id number]. Topic: Clinical - Medication Question >> Apr 20, 2023 11:09 AM Antony Haste wrote: Reason for CRM: The patient was prescribed DULoxetine (CYMBALTA) 60 MG capsule after her visit with Dr. Dickie La on 03/25. She states she has two prescriptions of the Duloxetine, one for 60 MG given on 03/25 and another for 30 MG given on 03/19. I advised the current dosage listed on her medication list, but she is wanting to gain some further clarification if she should be taking Duloxetine 30 MG capsules or Duloxetine 60 MG capsules? Callback [phone]

## 2023-04-23 NOTE — Telephone Encounter (Signed)
 I called the patient to clarify her rx dosage for duloxetine. Patient stated she doesn't know which medications she is supposed to be taking and what she is taking the medications for, she stated she isn't used to taking "all of these pills". Patient stated she doesn't feel well and that she doesn't want to go anywhere because she think she will "fall out". Patient stated her bp has been high. Saturday her bp was 143/75 and last night her bp was in the same range (no numbers provided).Please return patients call.

## 2023-04-23 NOTE — Telephone Encounter (Signed)
 I called and spoke to the patient I gave her your message, patient understands and will follow your instructions.

## 2023-04-23 NOTE — Telephone Encounter (Signed)
 I have attempted to reach the patient x2, no answer and voicemail not set up. Duloxetine was increased to 60 mg at last visit, 3/25, for depression and chronic pain. She can take two 30 mg tablets until she runs out, then start taking one 60 mg tablet daily.

## 2023-04-24 ENCOUNTER — Telehealth: Payer: Self-pay | Admitting: Internal Medicine

## 2023-04-24 NOTE — Telephone Encounter (Signed)
 Attempted to f/u with [name] again regarding phone call yesterday. No answer. Message regarding duloxetine relayed by clinic staff 3/31.

## 2023-04-25 ENCOUNTER — Telehealth: Payer: Self-pay | Admitting: *Deleted

## 2023-04-25 NOTE — Telephone Encounter (Signed)
 Copied from CRM [id number]. Topic: General - Other >> Apr 25, 2023  2:43 PM Alcus Dad H wrote: Reason for CRM: [facility] with [facility] called because they have not been able to contact the patient to schedule her annual mammogram. Confirmed the phone numbers on file are the same numbers she has been dialing to try and contact the patient. If the office speaks to the patient before they are able to reach her, can you have the patient contact [facility] at [phone], opt 1 and then opt 1 again to get her scheduled.

## 2023-04-25 NOTE — Telephone Encounter (Signed)
 I called pt -no answer and vm has not been set-up; unable to leave a message.

## 2023-04-26 NOTE — Telephone Encounter (Signed)
 Pt called pt again - no answer; No vm, unable to leave a message.

## 2023-04-26 NOTE — Telephone Encounter (Signed)
 Return call from pt - informed pt [facility]  has been trying to call her to schedule her mammogram. First she stated she too old and does care then stated she will call and thanks for the call.

## 2023-05-14 ENCOUNTER — Other Ambulatory Visit: Payer: Self-pay | Admitting: Internal Medicine

## 2023-05-14 ENCOUNTER — Ambulatory Visit: Payer: Self-pay

## 2023-05-14 DIAGNOSIS — F332 Major depressive disorder, recurrent severe without psychotic features: Secondary | ICD-10-CM

## 2023-05-14 DIAGNOSIS — G8929 Other chronic pain: Secondary | ICD-10-CM

## 2023-05-14 DIAGNOSIS — M150 Primary generalized (osteo)arthritis: Secondary | ICD-10-CM

## 2023-05-14 DIAGNOSIS — J00 Acute nasopharyngitis [common cold]: Secondary | ICD-10-CM

## 2023-05-14 NOTE — Telephone Encounter (Signed)
 Chief Complaint: Fall Symptoms: none Frequency: Fell on Thursday Pertinent Negatives: open wounds, major injuries Disposition: [] ED /[] Urgent Care (no appt availability in office) / [] Appointment(In office/virtual)/ []  [facility]/ [x] Home Care/ [] Refused Recommended Disposition /[] [facility]/ []  Follow-up with PCP Additional Notes: Patient initially called to request refill for medication but mentioned recent fall to agent, patient was transferred to Nurse triage for assessment. Patient states she fell while working on bird feeder on Thursday but landed on soft grass and didn't suffer any major injuries. Patient advised to call back if she notices any changes in chronic pain or worsening symptoms.   Copied from CRM [id number]. Topic: Clinical - Red Word Triage >> May 14, 2023  9:10 AM Danelle Dunning F wrote: [facility] that prompted transfer to Nurse Triage:   Patient fell this past weekend  Patient stated she didn't hit her head but she did hurt on knee (right), back and behind Reason for Disposition  [1] Recent fall AND [2] no injury  Answer Assessment - Initial Assessment Questions 1. MECHANISM: "How did the fall happen?"     While working on bird feeder, patient slipped 2. DOMESTIC VIOLENCE AND ELDER ABUSE SCREENING: "Did you fall because someone pushed you or tried to hurt you?" If Yes, ask: "Are you safe now?"     No 3. ONSET: "When did the fall happen?" (e.g., minutes, hours, or days ago)     "Wednesday or Thursday" 4. LOCATION: "What part of the body hit the ground?" (e.g., back, buttocks, head, hips, knees, hands, head, stomach)     Knee, back, buttocks 5. INJURY: "Did you hurt (injure) yourself when you fell?" If Yes, ask: "What did you injure? Tell me more about this?" (e.g., body area; type of injury; pain severity)"     No major injuries 6. PAIN: "Is there any pain?" If Yes, ask: "How bad is the pain?" (e.g., Scale 1-10; or mild,  moderate, severe)   - NONE  (0): No pain   - MILD (1-3): Doesn't interfere with normal activities    - MODERATE (4-7): Interferes with normal activities or awakens from sleep    - SEVERE (8-10): Excruciating pain, unable to do any normal activities      No worsened pain 7. SIZE: For cuts, bruises, or swelling, ask: "How large is it?" (e.g., inches or centimeters)      No 9. OTHER SYMPTOMS: "Do you have any other symptoms?" (e.g., dizziness, fever, weakness; new onset or worsening).      N/a 10. CAUSE: "What do you think caused the fall (or falling)?" (e.g., tripped, dizzy spell)       Patient slipped while working on bird feeder and a hill  Protocols used: Falls and [facility]

## 2023-05-14 NOTE — Addendum Note (Signed)
 Addended by: Daine Drummer on: 05/14/2023 01:24 PM   Modules accepted: Orders

## 2023-05-14 NOTE — Telephone Encounter (Signed)
 Copied from CRM [id number]. Topic: Clinical - Medication Refill >> May 14, 2023  9:06 AM [name] F wrote: Most Recent Primary Care Visit:  Provider: Ancil Balzarine, GRACE  Department: [facility]  Visit Type: OPEN ESTABLISHED  Date: 04/17/2023   Patient stated she is in need of the listed prescriptions.    Medication:   1. DULoxetine  (CYMBALTA ) 60 MG capsule  2. fluticasone  (FLONASE ) 50 MCG/ACT nasal spray 3. acetaminophen -codeine  (TYLENOL  #3) 300-30 MG tablet  Has the patient contacted their pharmacy? Yes  Is this the correct pharmacy for this prescription? Yes   This is the patient's preferred pharmacy:  [facility] [address] [place] - [name] [place] [address] Phone: [phone] Fax: [phone]   Has the prescription been filled recently? No  Is the patient out of the medication? Yes  Has the patient been seen for an appointment in the last year OR does the patient have an upcoming appointment? Yes  Can we respond through MyChart? Yes  Agent: Please be advised that Rx refills may take up to 3 business days. We ask that you follow-up with your pharmacy.

## 2023-05-14 NOTE — Telephone Encounter (Signed)
 I called pt for more details - no answer; no vm, unable to leave a message.

## 2023-05-15 MED ORDER — ACETAMINOPHEN-CODEINE 300-30 MG PO TABS: 1.0000 | ORAL_TABLET | Freq: Every day | ORAL | 0 refills | Status: DC | PRN

## 2023-05-15 NOTE — Telephone Encounter (Signed)
 Refill sent to patient pharmacy. Error message on PDMP x2 days when attempting to review. No additional fills noted on medication dispense history.

## 2023-05-17 MED ORDER — FLUTICASONE PROPIONATE 50 MCG/ACT NA SUSP: 1.0000 | Freq: Every day | NASAL | 11 refills | Status: DC

## 2023-05-17 MED ORDER — DULOXETINE HCL 60 MG PO CPEP: 60.0000 mg | ORAL_CAPSULE | Freq: Every day | ORAL | 3 refills | Status: DC

## 2023-06-05 ENCOUNTER — Other Ambulatory Visit: Payer: Self-pay | Admitting: Internal Medicine

## 2023-06-05 DIAGNOSIS — F330 Major depressive disorder, recurrent, mild: Secondary | ICD-10-CM

## 2023-06-12 ENCOUNTER — Other Ambulatory Visit: Payer: Self-pay | Admitting: Internal Medicine

## 2023-06-12 DIAGNOSIS — G8929 Other chronic pain: Secondary | ICD-10-CM

## 2023-06-12 DIAGNOSIS — M150 Primary generalized (osteo)arthritis: Secondary | ICD-10-CM

## 2023-07-09 ENCOUNTER — Other Ambulatory Visit: Payer: Self-pay | Admitting: Internal Medicine

## 2023-07-09 DIAGNOSIS — G8929 Other chronic pain: Secondary | ICD-10-CM

## 2023-07-09 DIAGNOSIS — F330 Major depressive disorder, recurrent, mild: Secondary | ICD-10-CM

## 2023-07-09 DIAGNOSIS — M150 Primary generalized (osteo)arthritis: Secondary | ICD-10-CM

## 2023-07-13 ENCOUNTER — Ambulatory Visit: Admitting: Orthopaedic Surgery

## 2023-07-13 ENCOUNTER — Other Ambulatory Visit: Payer: Self-pay | Admitting: Internal Medicine

## 2023-07-13 ENCOUNTER — Telehealth: Payer: Self-pay

## 2023-07-13 DIAGNOSIS — M1711 Unilateral primary osteoarthritis, right knee: Secondary | ICD-10-CM

## 2023-07-13 DIAGNOSIS — K219 Gastro-esophageal reflux disease without esophagitis: Secondary | ICD-10-CM

## 2023-07-13 MED ORDER — BUPIVACAINE HCL 0.5 % IJ SOLN
2.0000 mL | INTRAMUSCULAR | Status: AC | PRN
  Administered 2023-07-13: 2 mL via INTRA_ARTICULAR

## 2023-07-13 MED ORDER — METHYLPREDNISOLONE ACETATE 40 MG/ML IJ SUSP
40.0000 mg | INTRAMUSCULAR | Status: AC | PRN
  Administered 2023-07-13: 40 mg via INTRA_ARTICULAR

## 2023-07-13 MED ORDER — LIDOCAINE HCL 1 % IJ SOLN
2.0000 mL | INTRAMUSCULAR | Status: AC | PRN
  Administered 2023-07-13: 2 mL

## 2023-07-13 NOTE — Telephone Encounter (Signed)
Please submit for right knee gel inj 

## 2023-07-13 NOTE — Progress Notes (Signed)
 Office Visit Note   Patient: [name]           Date of Birth: 07-14-1950           MRN: [id number] Visit Date: 07/13/2023              Requested by: Bevelyn Bryant, MD [address] [place], [address] [place],  [place] [address] PCP: Bevelyn Bryant, MD   Assessment & Plan: Visit Diagnoses:  1. Primary osteoarthritis of right knee     Plan: History of Present Illness [name] is a 73 year old female with knee osteoarthritis who presents for follow-up and cortisone injection.  She experienced significant relief from the last cortisone injection three months ago, but the effects have worn off. She describes the previous injection as painless and hopes for a similar experience today. She has received multiple injections in her knee in the past and is not afraid of shots. She inquires about the possibility of needing a knee replacement in the future, expressing a desire to avoid surgery but also not wanting to continue experiencing her current level of discomfort.  Right knee exam unchanged from prior visit.  Assessment and Plan Right knee osteoarthritis Chronic knee pain with previous relief from cortisone injections. Current pain suggests last injection effect has worn off. Discussed potential knee replacement if conservative treatments fail. Considered gel injections for longer relief.  - Administer cortisone injection to the right knee. [employer] approval process for gel injections.  This patient is diagnosed with osteoarthritis of the knee(s).    Radiographs show evidence of joint space narrowing, osteophytes, subchondral sclerosis and/or subchondral cysts.  This patient has knee pain which interferes with functional and activities of daily living.    This patient has experienced inadequate response, adverse effects and/or intolerance with conservative treatments such as acetaminophen , NSAIDS, topical creams, physical therapy or regular exercise, knee  bracing and/or weight loss.   This patient has experienced inadequate response or has a contraindication to intra articular steroid injections for at least 3 months.   This patient is not scheduled to have a total knee replacement within 6 months of starting treatment with viscosupplementation.  Follow-Up Instructions: No follow-ups on file.   Orders:  Orders Placed This Encounter  Procedures   Large Joint Inj: R knee   No orders of the defined types were placed in this encounter.     Procedures: Large Joint Inj: R knee on 07/13/2023 9:50 AM Indications: pain Details: 22 G needle  Arthrogram: No  Medications: 40 mg methylPREDNISolone  acetate 40 MG/ML; 2 mL lidocaine  1 %; 2 mL bupivacaine  0.5 % Consent was given by the patient. Patient was prepped and draped in the usual sterile fashion.     Subjective: Chief Complaint  Patient presents with   Right Knee - Pain   PMFS History: Patient Active Problem List   Diagnosis Date Noted   Screening for colon cancer 04/17/2023   Cholelithiases 04/17/2023   Spinal stenosis 02/24/2022   Osteopenia 11/18/2021   Vitamin D  deficiency 04/18/2019   Obesity (BMI 30-39.9) 03/09/2017   Knee pain, right 07/01/2014   GERD (gastroesophageal reflux disease) 05/28/2014   Pre-diabetes 12/17/2013   Generalized anxiety disorder 02/19/2013   Moderately severe recurrent major depression (HCC) 04/22/2012   Hyperlipidemia 02/20/2008   HYPERTENSION, BENIGN ESSENTIAL 10/23/2007   Insomnia 03/28/2006   Past Medical History:  Diagnosis Date   Anxiety    Arthritis    Chronic sinusitis 04/30/2013  Depression    Diverticulosis 04/19/2011   Dx on colonoscopy in 05/2006     GERD (gastroesophageal reflux disease)    Hypercalcemia 06/06/2022   PONV (postoperative nausea and vomiting)    Routine adult health maintenance 11/06/2011   Colonoscopy: 2010 - diverticulosis, no polyps  DEXA - 2008, osteopenia in the hip.  Due to diet, would recommend  repeat and Ca/Vit D supplementation  MMG - 04/2011, negative  PAP - 05/2011, inadequate, needs discussion     Flu: 09/2012  Pneumovax at 73yo  Tetanus: needs updating  Zostavax: due, to discuss     Vegetarian diet 08/18/2011    Family History  Problem Relation Age of Onset   Cancer Mother 70       Lung   Parkinsonism Father    Dementia Father    Drug abuse Brother    Depression Brother     Past Surgical History:  Procedure Laterality Date   BUNIONECTOMY     bilateral   FRONTAL SINUS EXPLORATION Left 09/15/2014   Procedure: LEFT ENDOSCOPIC FRONTAL RECESS  EXPLORATION WITH FUSION IMAGE GUIDANCE NAVIGATION;  Surgeon: Reynold Caves, MD;  Location: [facility];  Service: ENT;  Laterality: Left;   SEPTOPLASTY Bilateral 10/20/2013   Procedure: SEPTOPLASTY;  Surgeon: Lawence Press, MD;  Location: [facility];  Service: ENT;  Laterality: Bilateral;   SINUS ENDO W/FUSION Bilateral 10/20/2013   Procedure: ENDOSCOPIC SINUS SURGERY WITH FUSION NAVIGATION, BILATERAL MAXILLARY ANTROSTOMIES, BILATERAL ETHMOIDECTOMIES, BILATERAL SPHENOIDECTOMIES, BILATERAL FRONTAL RECESS EXPLORATION;  Surgeon: Lawence Press, MD;  Location: [facility];  Service: ENT;  Laterality: Bilateral;   SINUS ENDO WITH FUSION Left 09/15/2014   Procedure: LEFT ENDOSCOPIC TOTAL ETHMOIDECTOMY WITH FUSION IMAGE GUIDANCE NAVIGATION;  Surgeon: Reynold Caves, MD;  Location: [facility];  Service: ENT;  Laterality: Left;   SPHENOIDECTOMY Right 09/15/2014   Procedure: RIGHT ENDOSCOPIC SPHENOIDECTOMY WITH FUSION IMAGE GUIDANCE NAVIGATION;  Surgeon: Reynold Caves, MD;  Location: [facility];  Service: ENT;  Laterality: Right;   Social History   Occupational History   Not on file  Tobacco Use   Smoking status: Never   Smokeless tobacco: Never  Vaping Use   Vaping status: Never Used  Substance and Sexual Activity   Alcohol use: Not Currently    Alcohol/week: 0.0 standard drinks of alcohol   Drug  use: No   Sexual activity: Not Currently

## 2023-07-13 NOTE — Telephone Encounter (Signed)
 VOB submitted for Durolane, right knee.

## 2023-07-16 NOTE — Telephone Encounter (Signed)
 Medication sent to pharmacy

## 2023-07-19 ENCOUNTER — Emergency Department (HOSPITAL_BASED_OUTPATIENT_CLINIC_OR_DEPARTMENT_OTHER)
Admission: EM | Admit: 2023-07-19 | Discharge: 2023-07-19 | Disposition: A | Discharge status: Home / Self Care | Attending: Emergency Medicine | Admitting: Emergency Medicine

## 2023-07-19 ENCOUNTER — Other Ambulatory Visit: Payer: Self-pay

## 2023-07-19 DIAGNOSIS — R350 Frequency of micturition: Secondary | ICD-10-CM | POA: Diagnosis present

## 2023-07-19 DIAGNOSIS — N3000 Acute cystitis without hematuria: Secondary | ICD-10-CM | POA: Insufficient documentation

## 2023-07-19 LAB — URINALYSIS, ROUTINE W REFLEX MICROSCOPIC
Bilirubin Urine: NEGATIVE
Glucose, UA: NEGATIVE mg/dL
Ketones, ur: NEGATIVE mg/dL
Nitrite: POSITIVE — AB
Protein, ur: 100 mg/dL — AB
Specific Gravity, Urine: 1.02 (ref 1.005–1.030)
pH: 6.5 (ref 5.0–8.0)

## 2023-07-19 LAB — URINALYSIS, MICROSCOPIC (REFLEX): WBC, UA: >50 WBC/hpf (ref 0–5)

## 2023-07-19 MED ORDER — SULFAMETHOXAZOLE-TRIMETHOPRIM 800-160 MG PO TABS
1.0000 | ORAL_TABLET | Freq: Once | ORAL | Status: AC
  Administered 2023-07-19: 1 via ORAL
  Filled 2023-07-19: qty 1

## 2023-07-19 MED ORDER — PHENAZOPYRIDINE HCL 200 MG PO TABS
200.0000 mg | ORAL_TABLET | Freq: Three times a day (TID) | ORAL | 0 refills | Status: DC
Start: 2023-07-19 — End: 2023-09-18

## 2023-07-19 MED ORDER — SULFAMETHOXAZOLE-TRIMETHOPRIM 800-160 MG PO TABS
1.0000 | ORAL_TABLET | Freq: Two times a day (BID) | ORAL | 0 refills | Status: AC
Start: 2023-07-19 — End: 2023-07-24

## 2023-07-19 NOTE — ED Triage Notes (Signed)
 Patient states urinary frequency and urgency x2 days. History of frequent UTIs

## 2023-07-19 NOTE — Discharge Instructions (Signed)
 Take the medications as prescribed. The antibiotic for infection and pyridium  to help empty the bladder. Follow up with your doctor for recheck if needed. Return to the ED with any new or concerning symptoms.

## 2023-07-19 NOTE — ED Provider Notes (Signed)
 [place] [facility] DEPARTMENT AT [facility] Provider Note   CSN: [id number] Arrival date & time: 07/19/23  1219     Patient presents with: Urinary Frequency   [name] is a 73 y.o. female.   Patient to ED with urinary hesitancy and frequency x 2 days. No fever, vomiting. She denies abdominal or flank pain. She has had UTI's in the past with similar symptoms.   The history is provided by the patient. No language interpreter was used.  Urinary Frequency       Prior to Admission medications   Medication Sig Start Date End Date Taking? Authorizing Provider  escitalopram  (LEXAPRO ) 20 MG tablet Take 20 mg by mouth daily. 06/11/23  Yes [provider]  phenazopyridine  (PYRIDIUM ) 200 MG tablet Take 1 tablet (200 mg total) by mouth 3 (three) times daily. 07/19/23  Yes Nissa Stannard, Margit, PA-C  sulfamethoxazole -trimethoprim  (BACTRIM  DS) 800-160 MG tablet Take 1 tablet by mouth 2 (two) times daily for 5 days. 07/19/23 07/24/23 Yes Tayt Moyers, Margit, PA-C  acetaminophen -codeine  (TYLENOL  #3) 300-30 MG tablet Take 1-2 tablets by mouth daily as needed for severe pain (pain score 7-10). 07/14/23   Karna Fellows, MD  ALPRAZolam  (XANAX ) 0.5 MG tablet TAKE 1 TO 2 TABLETS BY MOUTH ONCE DAILY AS NEEDED FOR ANXIETY 07/10/23   Karna Fellows, MD  amLODipine  (NORVASC ) 2.5 MG tablet Take 2.5 mg by mouth daily.    [provider]  DULoxetine  (CYMBALTA ) 60 MG capsule Take 1 capsule (60 mg total) by mouth daily. 05/17/23   Karna Fellows, MD  fluticasone  (FLONASE ) 50 MCG/ACT nasal spray Place 1 spray into both nostrils daily. 05/17/23   Karna Fellows, MD  omeprazole  (PRILOSEC) 20 MG capsule Take 1 capsule by mouth once daily 07/16/23   Karna Fellows, MD  pregabalin  (LYRICA ) 75 MG capsule Take 1 capsule (75 mg total) by mouth 2 (two) times daily. 08/11/22   Leopold Damien NOVAK, MD  exenatide  (BYETTA  5 MCG PEN) 5 MCG/0.02ML SOLN Inject 0.02 mLs (5 mcg total) into the skin 2 (two) times daily with a meal. 05/05/10  04/10/11  Marvine Almarie CROME, DO  famotidine  (PEPCID ) 40 MG tablet Take 1 tablet (40 mg total) by mouth daily. 05/05/10 04/10/11  Marvine Almarie CROME, DO    Allergies: Codeine , Penicillins, Cephalexin, and Lamictal  [lamotrigine ]    Review of Systems  Genitourinary:  Positive for frequency.    Updated Vital Signs BP (!) 117/95 (BP Location: Right Arm)   Pulse (!) 45   Temp 98 F (36.7 C)   Resp 18   SpO2 98%   Physical Exam Vitals and nursing note reviewed.  Constitutional:      General: She is not in acute distress.    Appearance: She is well-developed. She is not ill-appearing.  Pulmonary:     Effort: Pulmonary effort is normal.  Abdominal:     General: There is no distension.     Palpations: Abdomen is soft.     Tenderness: There is no abdominal tenderness. There is no right CVA tenderness or left CVA tenderness.   Musculoskeletal:        General: Normal range of motion.     Cervical back: Normal range of motion.   Skin:    General: Skin is warm and dry.   Neurological:     Mental Status: She is alert and oriented to person, place, and time.     (all labs ordered are listed, but only abnormal results are displayed) Labs Reviewed  URINALYSIS,  ROUTINE W REFLEX MICROSCOPIC - Abnormal; Notable for the following components:      Result Value   Color, Urine YELLOW (*)    APPearance CLOUDY (*)    Hgb urine dipstick MODERATE (*)    Protein, ur 100 (*)    Nitrite POSITIVE (*)    Leukocytes,Ua MODERATE (*)    All other components within normal limits  URINALYSIS, MICROSCOPIC (REFLEX) - Abnormal; Notable for the following components:   Bacteria, UA MANY (*)    All other components within normal limits  URINE CULTURE    EKG: None  Radiology: No results found.   Procedures   Medications Ordered in the ED  sulfamethoxazole -trimethoprim  (BACTRIM  DS) 800-160 MG per tablet 1 tablet (has no administration in time range)    Clinical Course as of 07/19/23 1426   Thu Jul 19, 2023  1414 Patient with urinary symptoms of frequency but the feeling she cannot empty her bladder when she goes. No fever, no pain, not vomiting. PCN and cephalexin allergy. Will start Bactrim . Culture added to labs. Stable for discharge home.  [SU]    Clinical Course User Index [SU] Odell Balls, PA-C                                 Medical Decision Making Amount and/or Complexity of Data Reviewed Labs: ordered.  Risk Prescription drug management.        Final diagnoses:  Acute cystitis without hematuria    ED Discharge Orders          Ordered    sulfamethoxazole -trimethoprim  (BACTRIM  DS) 800-160 MG tablet  2 times daily        07/19/23 1416    phenazopyridine  (PYRIDIUM ) 200 MG tablet  3 times daily        07/19/23 1416               Odell Balls, PA-C 07/19/23 1426    Mannie Pac T, DO 07/21/23 [phone]

## 2023-07-21 ENCOUNTER — Encounter: Payer: Self-pay | Admitting: Internal Medicine

## 2023-07-22 LAB — URINE CULTURE: Culture: >=100000 — AB

## 2023-07-23 ENCOUNTER — Telehealth (HOSPITAL_BASED_OUTPATIENT_CLINIC_OR_DEPARTMENT_OTHER): Payer: Self-pay | Admitting: *Deleted

## 2023-07-23 NOTE — Telephone Encounter (Signed)
 Post ED Visit - Positive Culture Follow-up  Culture report reviewed by antimicrobial stewardship pharmacist: [facility] [x]  Leonor Bash, [place].D. []  Venetia Gully, Pharm.D., BCPS AQ-ID []  Garrel Crews, Pharm.D., BCPS []  Almarie Lunger, Pharm.D., BCPS []  [place], [address].D., BCPS, AAHIVP []  Rosaline Bihari, Pharm.D., BCPS, AAHIVP []  Vernell Meier, PharmD, BCPS []  Latanya Hint, PharmD, BCPS []  Donald Medley, PharmD, BCPS []  Rocky Bold, PharmD []  Dorothyann Alert, PharmD, BCPS []  Morene Babe, PharmD  [facility] []  Rosaline Edison, PharmD []  Romona Bliss, PharmD []  Dolphus Roller, PharmD []  Veva Seip, Rph []  Vernell Daunt) Leonce, PharmD []  Eva Allis, PharmD []  Rosaline Millet, PharmD []  Iantha Batch, PharmD []  Arvin Gauss, PharmD []  Wanda Hasting, PharmD []  Ronal Rav, PharmD []  Rocky Slade, PharmD []  Bard Jeans, PharmD   Positive urine culture Treated with sulfamethoxazole -trimethoprim , organism sensitive to the same and no further patient follow-up is required at this time.  Jama Wyman Kipper 07/23/2023, 3:14 PM

## 2023-07-24 ENCOUNTER — Ambulatory Visit: Admitting: Orthopaedic Surgery

## 2023-07-26 ENCOUNTER — Other Ambulatory Visit: Payer: Self-pay

## 2023-07-26 ENCOUNTER — Telehealth: Payer: Self-pay

## 2023-07-26 DIAGNOSIS — M1711 Unilateral primary osteoarthritis, right knee: Secondary | ICD-10-CM

## 2023-07-26 NOTE — Telephone Encounter (Signed)
 Yes she is approved.  Information has been added to her chart.

## 2023-07-26 NOTE — Telephone Encounter (Signed)
 Patient is scheduled to come in Tuesday 07/31/2023 for Durolane. Is she approved yet?

## 2023-07-31 ENCOUNTER — Ambulatory Visit (INDEPENDENT_AMBULATORY_CARE_PROVIDER_SITE_OTHER): Admitting: Orthopaedic Surgery

## 2023-07-31 ENCOUNTER — Other Ambulatory Visit (INDEPENDENT_AMBULATORY_CARE_PROVIDER_SITE_OTHER)

## 2023-07-31 ENCOUNTER — Encounter: Payer: Self-pay | Admitting: Orthopaedic Surgery

## 2023-07-31 DIAGNOSIS — G8929 Other chronic pain: Secondary | ICD-10-CM

## 2023-07-31 DIAGNOSIS — M25561 Pain in right knee: Secondary | ICD-10-CM

## 2023-07-31 MED ORDER — SODIUM HYALURONATE 60 MG/3ML IX PRSY
60.0000 mg | PREFILLED_SYRINGE | INTRA_ARTICULAR | Status: AC | PRN
Start: 2023-07-31 — End: 2023-07-31
  Administered 2023-07-31: 60 mg via INTRA_ARTICULAR

## 2023-07-31 NOTE — Progress Notes (Signed)
   Procedure Note  Patient: [name]             Date of Birth: 09/26/1950           MRN: [id number]             Visit Date: 07/31/2023  Procedures: Visit Diagnoses:  1. Chronic pain of right knee     Large Joint Inj: R knee on 07/31/2023 10:54 AM Indications: pain Details: 22 G needle  Arthrogram: No  Medications: 60 mg Sodium Hyaluronate 60 MG/3ML Outcome: tolerated well, no immediate complications Patient was prepped and draped in the usual sterile fashion.

## 2023-08-03 ENCOUNTER — Ambulatory Visit: Admitting: Orthopaedic Surgery

## 2023-08-06 ENCOUNTER — Other Ambulatory Visit: Payer: Self-pay | Admitting: Internal Medicine

## 2023-08-06 DIAGNOSIS — M150 Primary generalized (osteo)arthritis: Secondary | ICD-10-CM

## 2023-08-06 DIAGNOSIS — G8929 Other chronic pain: Secondary | ICD-10-CM

## 2023-08-07 ENCOUNTER — Telehealth: Payer: Self-pay | Admitting: Orthopaedic Surgery

## 2023-08-07 ENCOUNTER — Telehealth: Payer: Self-pay | Admitting: *Deleted

## 2023-08-07 NOTE — Telephone Encounter (Signed)
 Will forward to PCP. Copied from CRM [id number]. Topic: Clinical - Prescription Issue >> Aug 06, 2023  4:51 PM Adrianna P wrote: Reason for CRM: Pharmacy said she needs to wait 3 days before she can get acetaminophen -codeine  (TYLENOL  #3) 300-30 MG tablet, patient wants to know if Dr can okay for her get it early or atleast half of it, she is in pain, she can hardly walk

## 2023-08-07 NOTE — Telephone Encounter (Signed)
 Pt called requesting a call back due to increased pains since gel injection. Please call pt at [phone].

## 2023-08-07 NOTE — Telephone Encounter (Signed)
 Spoke with [name]. She has had an acute increase in chronic right knee pain since injection given in orthopedics office on 07/31/23. She is currently out of acetaminophen -codeine . Has continued to need 1-2 tablets daily for chronic pain, sometimes an additional tablet at night for continued pain since acute change.  She has not yet called Dr. Benjiman office to discuss. Called pharmacy to authorize refill today. Encouraged [name] to call Dr. Jerri to discuss increased pain since injection.

## 2023-08-07 NOTE — Telephone Encounter (Signed)
 Called patient. Dr.Xu recommends Ibuprofen 800mg  and ice.

## 2023-08-14 ENCOUNTER — Other Ambulatory Visit: Payer: Self-pay | Admitting: Internal Medicine

## 2023-08-14 DIAGNOSIS — F330 Major depressive disorder, recurrent, mild: Secondary | ICD-10-CM

## 2023-09-05 ENCOUNTER — Ambulatory Visit: Payer: 59

## 2023-09-05 VITALS — Ht 66.5 in | Wt 154.0 lb

## 2023-09-05 DIAGNOSIS — Z0000 Encounter for general adult medical examination without abnormal findings: Secondary | ICD-10-CM | POA: Diagnosis not present

## 2023-09-05 NOTE — Progress Notes (Signed)
 Because this visit was a virtual/telehealth visit,  certain criteria was not obtained, such a blood pressure, CBG if applicable, and timed get up and go. Any medications not marked as taking were not mentioned during the medication reconciliation part of the visit. Any vitals not documented were not able to be obtained due to this being a telehealth visit or patient was unable to self-report a recent blood pressure reading due to a lack of equipment at home via telehealth. Vitals that have been documented are verbally provided by the patient.   Subjective:   [name] is a 73 y.o. who presents for a Medicare Wellness preventive visit.  As a reminder, Annual Wellness Visits don't include a physical exam, and some assessments may be limited, especially if this visit is performed virtually. We may recommend an in-person follow-up visit with your provider if needed.  Visit Complete: Virtual I connected with  [name] on 09/05/23 by a audio enabled telemedicine application and verified that I am speaking with the correct person using two identifiers.  Patient Location: Home  Provider Location: Home Office  I discussed the limitations of evaluation and management by telemedicine. The patient expressed understanding and agreed to proceed.  Vital Signs: Because this visit was a virtual/telehealth visit, some criteria may be missing or patient reported. Any vitals not documented were not able to be obtained and vitals that have been documented are patient reported.  VideoDeclined- This patient declined Librarian, academic. Therefore the visit was completed with audio only.  Persons Participating in Visit: Patient.  AWV Questionnaire: No: Patient Medicare AWV questionnaire was not completed prior to this visit.  Cardiac Risk Factors include: advanced age (>45men, >79 women);hypertension;sedentary lifestyle     Objective:    Today's Vitals    09/05/23 1033 09/05/23 1042  Weight: 154 lb (69.9 kg)   Height: 5' 6.5 (1.689 m)   PainSc: 5  5   PainLoc: Generalized    Body mass index is 24.48 kg/m.     09/05/2023   10:41 AM 07/19/2023   12:38 PM 04/17/2023    9:14 AM 01/15/2023    2:12 PM 08/11/2022   11:14 AM 08/11/2022    9:05 AM 07/11/2022    4:23 PM  Advanced Directives  Does Patient Have a Medical Advance Directive? Yes No Yes No Yes Yes No  Type of Estate agent of [place];Living will  Healthcare Power of [place];Living will  Healthcare Power of [employer] of [place];Living will   Does patient want to make changes to medical advance directive? No - Patient declined  No - Patient declined   No - Guardian declined   Copy of Healthcare Power of Attorney in Chart? Yes - validated most recent copy scanned in chart (See row information)  Yes - validated most recent copy scanned in chart (See row information)   Yes - validated most recent copy scanned in chart (See row information)   Would patient like information on creating a medical advance directive?    No - Patient declined   No - Patient declined    Current Medications (verified) Outpatient Encounter Medications as of 09/05/2023  Medication Sig   acetaminophen -codeine  (TYLENOL  #3) 300-30 MG tablet Take 1-2 tablets by mouth daily as needed for severe pain (pain score 7-10).   ALPRAZolam  (XANAX ) 0.5 MG tablet TAKE 1 TO 2 TABLETS BY MOUTH ONCE DAILY AS NEEDED FOR ANXIETY   DULoxetine  (CYMBALTA ) 60 MG capsule Take 1  capsule (60 mg total) by mouth daily.   escitalopram  (LEXAPRO ) 20 MG tablet Take 20 mg by mouth daily.   fluticasone  (FLONASE ) 50 MCG/ACT nasal spray Place 1 spray into both nostrils daily.   omeprazole  (PRILOSEC) 20 MG capsule Take 1 capsule by mouth once daily   phenazopyridine  (PYRIDIUM ) 200 MG tablet Take 1 tablet (200 mg total) by mouth 3 (three) times daily.   pregabalin  (LYRICA ) 75 MG capsule Take 1 capsule (75 mg total)  by mouth 2 (two) times daily.   amLODipine  (NORVASC ) 2.5 MG tablet Take 2.5 mg by mouth daily. (Patient not taking: Reported on 09/05/2023)   [DISCONTINUED] exenatide  (BYETTA  5 MCG PEN) 5 MCG/0.02ML SOLN Inject 0.02 mLs (5 mcg total) into the skin 2 (two) times daily with a meal.   [DISCONTINUED] famotidine  (PEPCID ) 40 MG tablet Take 1 tablet (40 mg total) by mouth daily.   No facility-administered encounter medications on file as of 09/05/2023.    Allergies (verified) Codeine , Penicillins, Cephalexin, and Lamictal  [lamotrigine ]   History: Past Medical History:  Diagnosis Date   Anxiety    Arthritis    Chronic sinusitis 04/30/2013   Depression    Diverticulosis 04/19/2011   Dx on colonoscopy in 05/2006     GERD (gastroesophageal reflux disease)    Hypercalcemia 06/06/2022   PONV (postoperative nausea and vomiting)    Routine adult health maintenance 11/06/2011   Colonoscopy: 2010 - diverticulosis, no polyps  DEXA - 2008, osteopenia in the hip.  Due to diet, would recommend repeat and Ca/Vit D supplementation  MMG - 04/2011, negative  PAP - 05/2011, inadequate, needs discussion     Flu: 09/2012  Pneumovax at 73yo  Tetanus: needs updating  Zostavax: due, to discuss     Vegetarian diet 08/18/2011   Past Surgical History:  Procedure Laterality Date   BUNIONECTOMY     bilateral   FRONTAL SINUS EXPLORATION Left 09/15/2014   Procedure: LEFT ENDOSCOPIC FRONTAL RECESS  EXPLORATION WITH FUSION IMAGE GUIDANCE NAVIGATION;  Surgeon: Daniel Moccasin, MD;  Location: [facility];  Service: ENT;  Laterality: Left;   SEPTOPLASTY Bilateral 10/20/2013   Procedure: SEPTOPLASTY;  Surgeon: Ana LELON Moccasin, MD;  Location: [facility];  Service: ENT;  Laterality: Bilateral;   SINUS ENDO W/FUSION Bilateral 10/20/2013   Procedure: ENDOSCOPIC SINUS SURGERY WITH FUSION NAVIGATION, BILATERAL MAXILLARY ANTROSTOMIES, BILATERAL ETHMOIDECTOMIES, BILATERAL SPHENOIDECTOMIES, BILATERAL FRONTAL RECESS  EXPLORATION;  Surgeon: Ana LELON Moccasin, MD;  Location: [facility];  Service: ENT;  Laterality: Bilateral;   SINUS ENDO WITH FUSION Left 09/15/2014   Procedure: LEFT ENDOSCOPIC TOTAL ETHMOIDECTOMY WITH FUSION IMAGE GUIDANCE NAVIGATION;  Surgeon: Daniel Moccasin, MD;  Location: [facility];  Service: ENT;  Laterality: Left;   SPHENOIDECTOMY Right 09/15/2014   Procedure: RIGHT ENDOSCOPIC SPHENOIDECTOMY WITH FUSION IMAGE GUIDANCE NAVIGATION;  Surgeon: Daniel Moccasin, MD;  Location: [facility];  Service: ENT;  Laterality: Right;   Family History  Problem Relation Age of Onset   Cancer Mother 26       Lung   Parkinsonism Father    Dementia Father    Drug abuse Brother    Depression Brother    Social History   Socioeconomic History   Marital status: Single    Spouse name: Not on file   Number of children: Not on file   Years of education: Not on file   Highest education level: Not on file  Occupational History   Not on file  Tobacco Use  Smoking status: Never   Smokeless tobacco: Never  Vaping Use   Vaping status: Never Used  Substance and Sexual Activity   Alcohol use: Not Currently    Alcohol/week: 0.0 standard drinks of alcohol   Drug use: No   Sexual activity: Not Currently  Other Topics Concern   Not on file  Social History Narrative   Current Social History 05/27/2020        Patient lives alone in an apartment which is 1 story. There are not steps up to the entrance.       Patient's method of transportation is personal car.      The highest level of education was some college.      The patient currently retired.      Identified important Relationships are My dog       Pets : dog passed away       Interests / Fun: being with my dog, I don't like people       Current Stressors: poor family relationships       Religious / Personal Beliefs: I believe in God      Social Drivers of Health   Financial Resource Strain: Low Risk   (09/05/2023)   Overall Financial Resource Strain (CARDIA)    Difficulty of Paying Living Expenses: Not hard at all  Food Insecurity: No Food Insecurity (09/05/2023)   Hunger Vital Sign    Worried About Running Out of Food in the Last Year: Never true    Ran Out of Food in the Last Year: Never true  Transportation Needs: No Transportation Needs (09/05/2023)   PRAPARE - Administrator, Civil Service (Medical): No    Lack of Transportation (Non-Medical): No  Physical Activity: Inactive (09/05/2023)   Exercise Vital Sign    Days of Exercise per Week: 0 days    Minutes of Exercise per Session: 0 min  Stress: No Stress Concern Present (09/05/2023)   [employer] of Occupational Health - Occupational Stress Questionnaire    Feeling of Stress: Only a little  Social Connections: Socially Isolated (09/05/2023)   Social Connection and Isolation Panel    Frequency of Communication with Friends and Family: Once a week    Frequency of Social Gatherings with Friends and Family: Never    Attends Religious Services: Never    Diplomatic Services operational officer: No    Attends Engineer, structural: Never    Marital Status: Divorced    Tobacco Counseling Counseling given: Not Answered    Clinical Intake:  Pre-visit preparation completed: Yes  Pain : 0-10 Pain Score: 5  Pain Type: Chronic pain Pain Location: Generalized (LOWER BACK AND RIGHT KNEE) Pain Relieving Factors: TYLENOL , ADVIL  Pain Relieving Factors: TYLENOL , ADVIL  BMI - recorded: 24.48 Nutritional Status: BMI of 19-24  Normal Nutritional Risks: None Diabetes: No  Lab Results  Component Value Date   HGBA1C 5.6 11/18/2021   HGBA1C 5.6 09/07/2020   HGBA1C 5.4 04/18/2019     How often do you need to have someone help you when you read instructions, pamphlets, or other written materials from your doctor or pharmacy?: 1 - Never What is the last grade level you completed in school?: SOME  COLLEGE  Interpreter Needed?: No  Information entered by :: Roz Fuller, LPN.   Activities of Daily Living     09/05/2023   10:45 AM  In your present state of health, do you have any difficulty performing the following activities:  Hearing? 1  Vision? 0  Difficulty concentrating or making decisions? 0  Walking or climbing stairs? 1  Dressing or bathing? 0  Doing errands, shopping? 0  Preparing Food and eating ? N  Using the Toilet? N  In the past six months, have you accidently leaked urine? N  Do you have problems with loss of bowel control? N  Managing your Medications? N  Managing your Finances? N  Housekeeping or managing your Housekeeping? N    Patient Care Team: Karna Fellows, MD as PCP - General (Internal Medicine) Octavia, Charlie Hamilton, MD as Consulting Physician (Ophthalmology) Fabian Sober, RN as [facility] Care Management  I have updated your Care Teams any recent Medical Services you may have received from other providers in the past year.     Assessment:   This is a routine wellness examination for Leler.  Hearing/Vision screen Hearing Screening - Comments:: Some hearing difficulties. Vision Screening - Comments:: No rx glasses - not up to date with routine eye exams.    Goals Addressed             This Visit's Progress    Client understands the importance of follow-up with providers by attending scheduled visits       Prevent Falls         Depression Screen     09/05/2023   10:40 AM 04/17/2023   10:21 AM 02/08/2023    2:12 PM 01/15/2023    3:06 PM 08/30/2022   10:56 AM 08/11/2022   11:15 AM 08/11/2022   10:04 AM  PHQ 2/9 Scores  PHQ - 2 Score 0 3 6 6 3 2 2   PHQ- 9 Score 9 15 17 23 8 12 12     Fall Risk     09/05/2023   10:44 AM 04/17/2023    9:15 AM 08/30/2022   10:52 AM 08/11/2022   11:15 AM 08/11/2022    9:01 AM  Fall Risk   Falls in the past year? 1 0 0 1 1  Number falls in past yr: 1 0 0 1 1  Injury with Fall? 1  0 0 1 1  Risk for fall due to : History of fall(s);Impaired balance/gait;Orthopedic patient No Fall Risks No Fall Risks Impaired balance/gait Impaired balance/gait;Impaired mobility  Follow up Falls evaluation completed;Education provided Falls prevention discussed Falls prevention discussed Falls evaluation completed;Falls prevention discussed Falls evaluation completed;Falls prevention discussed    MEDICARE RISK AT HOME:  Medicare Risk at Home Any stairs in or around the home?: No If so, are there any without handrails?: No Home free of loose throw rugs in walkways, pet beds, electrical cords, etc?: Yes Adequate lighting in your home to reduce risk of falls?: Yes Life alert?: No Use of a cane, walker or w/c?: Yes (CANE (PRN)) Grab bars in the bathroom?: No Shower chair or bench in shower?: No Elevated toilet seat or a handicapped toilet?: No  TIMED UP AND GO:  Was the test performed?  No  Cognitive Function: Declined/Normal: No cognitive concerns noted by patient or family. Patient alert, oriented, able to answer questions appropriately and recall recent events. No signs of memory loss or confusion.    09/05/2023   10:40 AM  MMSE - Mini Mental State Exam  Not completed: Unable to complete        09/05/2023   10:43 AM 08/30/2022   11:00 AM 08/11/2022   11:18 AM 08/05/2021   12:16 PM  6CIT Screen  What Year? 0 points  0 points 0 points 0 points  What month? 0 points 0 points 0 points 0 points  What time? 0 points 0 points 0 points 0 points  Count back from 20 0 points 0 points 0 points 0 points  Months in reverse 0 points  0 points 0 points  Repeat phrase 0 points  0 points 0 points  Total Score 0 points  0 points 0 points    Immunizations Immunization History  Administered Date(s) Administered   Influenza Split 11/06/2011, 09/22/2018   Influenza Whole 10/23/2007, 11/03/2008, 10/05/2009   Influenza, High Dose Seasonal PF 11/15/2016, 09/22/2018, 09/20/2021, 03/23/2023    Influenza, Quadrivalent, Recombinant, Inj, Pf 10/02/2017   Influenza,inj,Quad PF,6+ Mos 10/15/2012, 09/24/2013, 11/10/2014   Influenza-Unspecified 08/23/2017, 10/10/2020   Moderna Sars-Covid-2 Vaccination 05/27/2019, 06/21/2019   Pneumococcal Polysaccharide-23 12/17/2013   Tdap 10/02/2017, 10/02/2017   Zoster Recombinant(Shingrix) 10/04/2021    Screening Tests Health Maintenance  Topic Date Due   Pneumococcal Vaccine: 50+ Years (2 of 2 - PCV) 12/18/2014   Zoster Vaccines- Shingrix (2 of 2) 11/29/2021   MAMMOGRAM  03/30/2023   INFLUENZA VACCINE  08/24/2023   Medicare Annual Wellness (AWV)  09/04/2024   DTaP/Tdap/Td (3 - Td or Tdap) 10/03/2027   DEXA SCAN  Completed   Hepatitis C Screening  Completed   Hepatitis B Vaccines  Aged Out   HPV VACCINES  Aged Out   Meningococcal B Vaccine  Aged Out   Colonoscopy  Discontinued   COVID-19 Vaccine  Discontinued   Fecal DNA (Cologuard)  Discontinued    Health Maintenance  Health Maintenance Due  Topic Date Due   Pneumococcal Vaccine: 50+ Years (2 of 2 - PCV) 12/18/2014   Zoster Vaccines- Shingrix (2 of 2) 11/29/2021   MAMMOGRAM  03/30/2023   INFLUENZA VACCINE  08/24/2023   Health Maintenance Items Addressed: Yes Patient is due for the following care gaps: Mammogram, Shingrix, Pneumococcal and Flu vaccines.  Additional Screening:  Vision Screening: Recommended annual ophthalmology exams for early detection of glaucoma and other disorders of the eye. Would you like a referral to an eye doctor? No    Dental Screening: Recommended annual dental exams for proper oral hygiene  Community Resource Referral / Chronic Care Management: CRR required this visit?  No   CCM required this visit?  No   Plan:    I have personally reviewed and noted the following in the patient's chart:   Medical and social history Use of alcohol, tobacco or illicit drugs  Current medications and supplements including opioid prescriptions. Patient is not  currently taking opioid prescriptions. Functional ability and status Nutritional status Physical activity Advanced directives List of other physicians Hospitalizations, surgeries, and ER visits in previous 12 months Vitals Screenings to include cognitive, depression, and falls Referrals and appointments  In addition, I have reviewed and discussed with patient certain preventive protocols, quality metrics, and best practice recommendations. A written personalized care plan for preventive services as well as general preventive health recommendations were provided to patient.   Roz LOISE Fuller, LPN   [id number]   After Visit Summary: (MyChart) Due to this being a telephonic visit, the after visit summary with patients personalized plan was offered to patient via MyChart   Notes: Patient is due for the following care gaps: Mammogram, Shingrix, Pneumococcal and Flu vaccines.

## 2023-09-05 NOTE — Patient Instructions (Signed)
 [name] , Thank you for taking time out of your busy schedule to complete your Annual Wellness Visit with me. I enjoyed our conversation and look forward to speaking with you again next year. I, as well as your care team,  appreciate your ongoing commitment to your health goals. Please review the following plan we discussed and let me know if I can assist you in the future. Your Game plan/ To Do List    Referrals: If you haven't heard from the office you've been referred to, please reach out to them at the phone provided.   Follow up Visits: We will see or speak with you next year for your Next Medicare AWV with our clinical staff Have you seen your provider in the last 6 months (3 months if uncontrolled diabetes)? Yes  Clinician Recommendations:  Aim for 30 minutes of exercise or brisk walking, 6-8 glasses of water, and 5 servings of fruits and vegetables each day.       This is a list of the screenings recommended for you:  Health Maintenance  Topic Date Due   Pneumococcal Vaccine for age over 74 (2 of 2 - PCV) 12/18/2014   Zoster (Shingles) Vaccine (2 of 2) 11/29/2021   Mammogram  03/30/2023   Flu Shot  08/24/2023   Medicare Annual Wellness Visit  09/04/2024   DTaP/Tdap/Td vaccine (3 - Td or Tdap) 10/03/2027   DEXA scan (bone density measurement)  Completed   Hepatitis C Screening  Completed   Hepatitis B Vaccine  Aged Out   HPV Vaccine  Aged Out   Meningitis B Vaccine  Aged Out   Colon Cancer Screening  Discontinued   COVID-19 Vaccine  Discontinued   Cologuard (Stool DNA test)  Discontinued    Advanced directives: (In Chart) A copy of your advanced directives are scanned into your chart should your provider ever need it. Advance Care Planning is important because it:  [x]  Makes sure you receive the medical care that is consistent with your values, goals, and preferences  [x]  It provides guidance to your family and loved ones and reduces their decisional burden about  whether or not they are making the right decisions based on your wishes.  Follow the link provided in your after visit summary or read over the paperwork we have mailed to you to help you started getting your Advance Directives in place. If you need assistance in completing these, please reach out to us  so that we can help you!  See attachments for Preventive Care and Fall Prevention Tips.

## 2023-09-05 NOTE — Progress Notes (Signed)
 Internal Medicine Attending:  I reviewed the AWV findings of the medical professional who conducted the visit. I was present in the office suite and immediately available to provide assistance and direction throughout the time the service was provided.

## 2023-09-07 ENCOUNTER — Other Ambulatory Visit: Payer: Self-pay | Admitting: Internal Medicine

## 2023-09-07 DIAGNOSIS — G8929 Other chronic pain: Secondary | ICD-10-CM

## 2023-09-07 DIAGNOSIS — M150 Primary generalized (osteo)arthritis: Secondary | ICD-10-CM

## 2023-09-10 ENCOUNTER — Other Ambulatory Visit: Payer: Self-pay | Admitting: Internal Medicine

## 2023-09-10 DIAGNOSIS — F330 Major depressive disorder, recurrent, mild: Secondary | ICD-10-CM

## 2023-09-10 NOTE — Telephone Encounter (Unsigned)
 Copied from CRM [id number]. Topic: Clinical - Medication Refill >> Sep 10, 2023  3:07 PM Laurier C wrote: Medication:  ALPRAZolam  (XANAX ) 0.5 MG tablet  Has the patient contacted their pharmacy? Yes   This is the patient's preferred pharmacy:  [facility] [address], [place] - [address] [place] [address] Phone: [phone] Fax: [phone]  Is this the correct pharmacy for this prescription? Yes If no, delete pharmacy and type the correct one.   Has the prescription been filled recently? No  Is the patient out of the medication? Yes  Has the patient been seen for an appointment in the last year OR does the patient have an upcoming appointment? Yes  Can we respond through MyChart? Yes  Agent: Please be advised that Rx refills may take up to 3 business days. We ask that you follow-up with your pharmacy.

## 2023-09-11 MED ORDER — ALPRAZOLAM 0.5 MG PO TABS: ORAL_TABLET | ORAL | 0 refills | Status: DC

## 2023-09-18 ENCOUNTER — Ambulatory Visit (INDEPENDENT_AMBULATORY_CARE_PROVIDER_SITE_OTHER): Payer: Self-pay | Admitting: Internal Medicine

## 2023-09-18 ENCOUNTER — Encounter: Payer: Self-pay | Admitting: Internal Medicine

## 2023-09-18 ENCOUNTER — Ambulatory Visit (HOSPITAL_COMMUNITY)
Admission: RE | Admit: 2023-09-18 | Discharge: 2023-09-18 | Disposition: A | Discharge status: Home / Self Care | Source: Ambulatory Visit | Attending: Family Medicine | Admitting: Family Medicine

## 2023-09-18 VITALS — BP 177/74 | HR 47 | Temp 98.4°F | Ht 61.0 in | Wt 158.2 lb

## 2023-09-18 DIAGNOSIS — R7303 Prediabetes: Secondary | ICD-10-CM | POA: Diagnosis not present

## 2023-09-18 DIAGNOSIS — M25561 Pain in right knee: Secondary | ICD-10-CM

## 2023-09-18 DIAGNOSIS — I499 Cardiac arrhythmia, unspecified: Secondary | ICD-10-CM | POA: Insufficient documentation

## 2023-09-18 DIAGNOSIS — R072 Precordial pain: Secondary | ICD-10-CM

## 2023-09-18 DIAGNOSIS — G8929 Other chronic pain: Secondary | ICD-10-CM

## 2023-09-18 DIAGNOSIS — F332 Major depressive disorder, recurrent severe without psychotic features: Secondary | ICD-10-CM

## 2023-09-18 DIAGNOSIS — E785 Hyperlipidemia, unspecified: Secondary | ICD-10-CM | POA: Diagnosis not present

## 2023-09-18 DIAGNOSIS — I10 Essential (primary) hypertension: Secondary | ICD-10-CM | POA: Diagnosis not present

## 2023-09-18 DIAGNOSIS — E7800 Pure hypercholesterolemia, unspecified: Secondary | ICD-10-CM

## 2023-09-18 DIAGNOSIS — F411 Generalized anxiety disorder: Secondary | ICD-10-CM

## 2023-09-18 LAB — GLUCOSE, CAPILLARY: Glucose-Capillary: 104 mg/dL — ABNORMAL HIGH (ref 70–99)

## 2023-09-18 LAB — POCT GLYCOSYLATED HEMOGLOBIN (HGB A1C): HbA1c, POC (prediabetic range): 5.4 % — AB (ref 5.7–6.4)

## 2023-09-18 MED ORDER — DICLOFENAC SODIUM 1 % EX GEL: 4.0000 g | Freq: Four times a day (QID) | CUTANEOUS | 3 refills | Status: AC

## 2023-09-18 NOTE — Patient Instructions (Addendum)
 It was wonderful to see you today!  Start taking amlodipine  2.5 mg daily again for blood pressure.  Please check your medications at home and make sure you are taking Cymbalta , NOT Lexapro . Please restart your pregabalin  (Lyrica ) twice daily for pain. I have also sent a prescription for diclofenac  (Voltaren ) gel that you should use four times daily to the knee. Make a follow-up appointment with Dr. Jerri to review your options for knee pain. If taking ibuprofen, continue to take it with food and take omeprazole  daily to protect your stomach.  If you have recurrent chest pain that does not go away, I would recommend going to the ED for evaluation.   If you think about counseling and want to give it a try, please let me know.  *For exercise classes (in-person and online), group activities, and more* (50+)            [email or web address]

## 2023-09-18 NOTE — Assessment & Plan Note (Signed)
 History of prediabetes, last A1c in prediabetes range in 2018. Will recheck today.

## 2023-09-18 NOTE — Assessment & Plan Note (Signed)
 Previous mild hypercalcemia that subsequently resolved. Mildly elevated again at last visit, repeat today with vitamin D  and PTH.

## 2023-09-18 NOTE — Assessment & Plan Note (Signed)
 PHQ9 elevated at 20 today. Unfortunately, Ms. Minish had to put her dog down in April of this year due to cancer and this has been very difficult. She does not feel she can get a new dog right now due to cost. She notes low motivation and interest in getting out of the house. We discussed resources for support and counseling, she declines for now. She is not interested in community or activity resources. She notes thoughts of lack of purpose but denies any active SI and reports she would not hurt herself. We had discussed taking Cymbalta  rather than Lexapro  at last visit to help with pain management, she is unsure what she is taking today but will check when she gets home. Recommend continued duloxetine  60 mg daily. Did not tolerate bupropion  in the past. Could consider addition of buspirone for augmentation if needed.

## 2023-09-18 NOTE — Assessment & Plan Note (Signed)
 Chronic. Right knee sodium hyaluronate injection with orthopedics 07/2023, some increased pain since that time. We reviewed Tylenol  #3 use, continues to use 1-2 daily as needed. Not feeling as much benefit as prior. Continuing ibuprofen, stopped pregabalin  and Voltaren  gel. We discussed that additional opioids are not recommended. We will work on additional methods of pain control, including resuming pregabalin , ensuring she is taking Cymbalta , and adding back topical Voltaren . May continue ibuprofen 800 mg tid with food and PPI. R knee XR 07/2023 with findings: X-rays demonstrate severe osteoarthritis.  Bone-on-bone joint space  narrowing.  Varus deformity.  No acute findings. I have recommended she make an appointment with orthopedics to discuss potential surgical options as medical management has not been helpful and she has tried multiple injections. Would need to continue to work on medical comorbidities for stability prior to surgery. Utox today.

## 2023-09-18 NOTE — Assessment & Plan Note (Signed)
 Elevated ASCVD risk score. Check lipid panel today and need for consideration of statin therapy.  Plan -Lipid panel

## 2023-09-18 NOTE — Assessment & Plan Note (Signed)
 Occasional episodes of substernal chest pain with occasional radiation to the left shoulder. Occurs at rest, not with exertion. No associated SOB, nausea. No active pain today, no tenderness over chest wall. EKG today with occasional PVCs, no acute ischemic changes. Some features typical, others atypical. Discussed referral to cardiology for further evaluation and Ms. Ganas declines today. I have recommended ED/911 evaluation if pain recurs and sustains.

## 2023-09-18 NOTE — Progress Notes (Signed)
 Established Patient Office Visit  Subjective   Patient ID: [name], female    DOB: [birth year]  Age: 73 y.o. MRN: [id number]  Chief Complaint  Patient presents with   Discuss medications    [name] returns to clinic today for f/u of chronic medical conditions and to discuss medications. Last visit in our clinic 03/2023.Please see assessment/plan in problem-based charting for further details of today's visit.   Patient Active Problem List   Diagnosis Date Noted   Irregular heart rhythm 09/18/2023   Screening for colon cancer 04/17/2023   Cholelithiases 04/17/2023   Hypercalcemia 06/06/2022   Spinal stenosis 02/24/2022   Osteopenia 11/18/2021   Vitamin D  deficiency 04/18/2019   Substernal chest pain 04/18/2019   Obesity (BMI 30-39.9) 03/09/2017   Knee pain, right 07/01/2014   GERD (gastroesophageal reflux disease) 05/28/2014   Pre-diabetes 12/17/2013   Generalized anxiety disorder 02/19/2013   Moderately severe recurrent major depression (HCC) 04/22/2012   Hyperlipidemia 02/20/2008   HYPERTENSION, BENIGN ESSENTIAL 10/23/2007   Insomnia 03/28/2006      Objective:     BP (!) 177/74 (BP Location: Right Arm, Patient Position: Sitting, Cuff Size: Normal)   Pulse (!) 47   Temp 98.4 F (36.9 C) (Oral)   Ht 5' 1 (1.549 m)   Wt 158 lb 3.2 oz (71.8 kg)   SpO2 95% Comment: RA  BMI 29.89 kg/m  BP Readings from Last 3 Encounters:  09/18/23 (!) 177/74  07/19/23 (!) 124/58  04/17/23 (!) 143/65   Wt Readings from Last 3 Encounters:  09/18/23 158 lb 3.2 oz (71.8 kg)  09/05/23 154 lb (69.9 kg)  04/17/23 160 lb (72.6 kg)    Physical Exam Constitutional:      General: She is not in acute distress.    Appearance: Normal appearance. She is not ill-appearing.  Cardiovascular:     Rate and Rhythm: Normal rate. Rhythm irregular.     Heart sounds: Normal heart sounds.  Pulmonary:     Effort: Pulmonary effort is normal. No respiratory distress.     Breath  sounds: Normal breath sounds.  Neurological:     General: No focal deficit present.     Mental Status: She is alert.  Psychiatric:        Behavior: Behavior normal.     Comments: Full affect; intermittently tearful when discussing her dog's passing       Assessment & Plan:   Problem List Items Addressed This Visit       Cardiovascular and Mediastinum   HYPERTENSION, BENIGN ESSENTIAL - Primary (Chronic)   Initial BP mildly elevated at 144/83, repeat increased. [name] notes she stopped taking amlodipine  2.5 mg daily several months ago. BP has been normal at office visit in the interim since last visit. Discussed resuming amlodipine  daily and will f/u in 4 weeks for RN BP check.        Other   Hyperlipidemia (Chronic)   Elevated ASCVD risk score. Check lipid panel today and need for consideration of statin therapy.  Plan -Lipid panel      Relevant Orders   Lipid panel   Pre-diabetes (Chronic)   History of prediabetes, last A1c in prediabetes range in 2018. Will recheck today.      Relevant Orders   POCT glycosylated hemoglobin (Hb A1C) (Completed)   Knee pain, right (Chronic)   Chronic. Right knee sodium hyaluronate injection with orthopedics 07/2023, some increased pain since that time. We reviewed Tylenol  #3 use, continues to use  1-2 daily as needed. Not feeling as much benefit as prior. Continuing ibuprofen, stopped pregabalin  and Voltaren  gel. We discussed that additional opioids are not recommended. We will work on additional methods of pain control, including resuming pregabalin , ensuring she is taking Cymbalta , and adding back topical Voltaren . May continue ibuprofen 800 mg tid with food and PPI. R knee XR 07/2023 with findings: X-rays demonstrate severe osteoarthritis.  Bone-on-bone joint space  narrowing.  Varus deformity.  No acute findings. I have recommended she make an appointment with orthopedics to discuss potential surgical options as medical management has  not been helpful and she has tried multiple injections. Would need to continue to work on medical comorbidities for stability prior to surgery. Utox today.        Relevant Orders   ToxAssure Select,+Antidepr,UR   Moderately severe recurrent major depression (HCC)   PHQ9 elevated at 20 today. Unfortunately, [name] had to put her dog down in April of this year due to cancer and this has been very difficult. She does not feel she can get a new dog right now due to cost. She notes low motivation and interest in getting out of the house. We discussed resources for support and counseling, she declines for now. She is not interested in community or activity resources. She notes thoughts of lack of purpose but denies any active SI and reports she would not hurt herself. We had discussed taking Cymbalta  rather than Lexapro  at last visit to help with pain management, she is unsure what she is taking today but will check when she gets home. Recommend continued duloxetine  60 mg daily. Did not tolerate bupropion  in the past. Could consider addition of buspirone for augmentation if needed.       Generalized anxiety disorder   Chronic. Continues alprazolam  1-2 tablets daily.      Substernal chest pain   Occasional episodes of substernal chest pain with occasional radiation to the left shoulder. Occurs at rest, not with exertion. No associated SOB, nausea. No active pain today, no tenderness over chest wall. EKG today with occasional PVCs, no acute ischemic changes. Some features typical, others atypical. Discussed referral to cardiology for further evaluation and [name] declines today. I have recommended ED/911 evaluation if pain recurs and sustains.       Hypercalcemia   Previous mild hypercalcemia that subsequently resolved. Mildly elevated again at last visit, repeat today with vitamin D  and PTH.      Relevant Orders   Parathyroid Hormone, Intact w/Ca   VITAMIN D  25 Hydroxy (Vit-D  Deficiency, Fractures)   Irregular heart rhythm   Irregular heart rhythm on auscultation. [name] denies palpitations or frequent dizziness. EKG with occasional PVCs with underlying SR. Reviewed these findings and encouraged to return with symptoms.      Relevant Orders   EKG 12-Lead (Completed)    Return in about 4 weeks (around 10/16/2023) for f/u mood, f/u BP.   Ronnald Sergeant, MD

## 2023-09-18 NOTE — Assessment & Plan Note (Signed)
 Initial BP mildly elevated at 144/83, repeat increased. [name] notes she stopped taking amlodipine  2.5 mg daily several months ago. BP has been normal at office visit in the interim since last visit. Discussed resuming amlodipine  daily and will f/u in 4 weeks for RN BP check.

## 2023-09-18 NOTE — Assessment & Plan Note (Signed)
 Chronic. Continues alprazolam  1-2 tablets daily.

## 2023-09-18 NOTE — Assessment & Plan Note (Signed)
 Irregular heart rhythm on auscultation. [name] denies palpitations or frequent dizziness. EKG with occasional PVCs with underlying SR. Reviewed these findings and encouraged to return with symptoms.

## 2023-09-20 ENCOUNTER — Ambulatory Visit: Payer: Self-pay | Admitting: Internal Medicine

## 2023-09-20 LAB — PTH, INTACT AND CALCIUM
Calcium: 11 mg/dL — ABNORMAL HIGH (ref 8.7–10.3)
PTH: 19 pg/mL (ref 15–65)

## 2023-09-20 LAB — TOXASSURE SELECT,+ANTIDEPR,UR

## 2023-09-20 LAB — LIPID PANEL
Chol/HDL Ratio: 4.1 ratio (ref 0.0–4.4)
Cholesterol, Total: 192 mg/dL (ref 100–199)
HDL: 47 mg/dL (ref >39)
LDL Chol Calc (NIH): 115 mg/dL — ABNORMAL HIGH (ref 0–99)
Triglycerides: 170 mg/dL — ABNORMAL HIGH (ref 0–149)
VLDL Cholesterol Cal: 30 mg/dL (ref 5–40)

## 2023-09-20 LAB — VITAMIN D 25 HYDROXY (VIT D DEFICIENCY, FRACTURES): Vit D, 25-Hydroxy: 29 ng/mL — ABNORMAL LOW (ref 30.0–100.0)

## 2023-09-20 NOTE — Progress Notes (Signed)
 Calcium elevated at 11, PTH low-normal at 19. Additional lab evaluation ordered and advised [name] to have this completed next week via MyChart message. Vit D 29, will wait on repletion for now pending further investigation of hypercalcemia.

## 2023-09-20 NOTE — Progress Notes (Signed)
 A1c wnl

## 2023-09-20 NOTE — Progress Notes (Signed)
 ASCVD risk 33%. Recommend starting statin therapy for primary prevention. MyChart message sent to patient.

## 2023-09-23 ENCOUNTER — Other Ambulatory Visit: Payer: Self-pay | Admitting: Internal Medicine

## 2023-09-23 DIAGNOSIS — F330 Major depressive disorder, recurrent, mild: Secondary | ICD-10-CM

## 2023-09-25 ENCOUNTER — Telehealth: Payer: Self-pay | Admitting: Internal Medicine

## 2023-09-25 NOTE — Telephone Encounter (Signed)
 Attempted to call [name] x2 to review labs from last week and discuss Utox results, medications for depression/anxiety, and hyperlipidemia. No answer and no identifying voicemail.

## 2023-09-27 ENCOUNTER — Telehealth: Payer: Self-pay | Admitting: Internal Medicine

## 2023-09-27 DIAGNOSIS — E7800 Pure hypercholesterolemia, unspecified: Secondary | ICD-10-CM

## 2023-09-27 DIAGNOSIS — F332 Major depressive disorder, recurrent severe without psychotic features: Secondary | ICD-10-CM

## 2023-09-27 DIAGNOSIS — F411 Generalized anxiety disorder: Secondary | ICD-10-CM

## 2023-09-27 DIAGNOSIS — R072 Precordial pain: Secondary | ICD-10-CM

## 2023-09-27 MED ORDER — ROSUVASTATIN CALCIUM 20 MG PO TABS: 20.0000 mg | ORAL_TABLET | Freq: Every day | ORAL | 11 refills | Status: AC

## 2023-09-27 MED ORDER — DULOXETINE HCL 30 MG PO CPEP: ORAL_CAPSULE | ORAL | 3 refills | Status: DC

## 2023-09-27 MED ORDER — BUSPIRONE HCL 5 MG PO TABS: 5.0000 mg | ORAL_TABLET | Freq: Two times a day (BID) | ORAL | 3 refills | Status: DC

## 2023-09-27 NOTE — Telephone Encounter (Signed)
 Able to reach [name] today to discuss f/u from last week's visit.  Calcium  labs reviewed. Need for f/u labs which she will come in for, likely early next week. Previously taking calcium  supplements, has stopped for some time. Resumed taking vitamin D  5000 units recently, asked her to stop for now pending further evaluation.  Reviewed lipids and elevated ASCVD risk. Recommend statin therapy which she is amenable to. Start rosuvastatin  20 mg daily. We reviewed her symptoms of atypical chest pain noted at last visit. She notes that she does not plan to call anyone or go to the ED if this pain occurs. I reiterated my recommendation to call 911/ED if symptoms are severe or persistent. She is okay to see cardiology for evaluation at this time. She has resumed taking amlodipine , not checking at home.  Reviewed depression/anxiety symptoms. Worsened since her dog died this spring. She is currently taking Lexapro , not duloxetine . We discussed option to switch to duloxetine  and she is agreeable. Will take 30 mg for two weeks, then increase to 60 mg. Will also start buspirone  5 mg bid. Stop Lexapro  when starting new medications. She does reiterate feelings of not wanting to be here but also continues to say she would not hurt herself and has no active SI. I don't want to be the one to make the decision not to be here, I'll let God decide that.   Planning to see orthopedics for potential knee surgery. She reports longstanding history of hives and headaches with taking codeine . She notes that she mentioned this to previous prescriber and was symptomatically treated for these side effects (benadryl and ibuprofen).

## 2023-10-01 ENCOUNTER — Telehealth: Payer: Self-pay | Admitting: *Deleted

## 2023-10-01 ENCOUNTER — Ambulatory Visit: Payer: Self-pay

## 2023-10-01 ENCOUNTER — Other Ambulatory Visit: Payer: Self-pay | Admitting: Internal Medicine

## 2023-10-01 DIAGNOSIS — F330 Major depressive disorder, recurrent, mild: Secondary | ICD-10-CM

## 2023-10-01 NOTE — Telephone Encounter (Signed)
 Patient transferred to front office to schedule an appointment for tomorrow.

## 2023-10-01 NOTE — Telephone Encounter (Signed)
 Called pt - no answer, left message on pt's vm to call the office.

## 2023-10-01 NOTE — Telephone Encounter (Signed)
 RTC to patient.  No answer.  Unable to leave a message. . Copied from CRM [id number]. Topic: Clinical - Medication Question >> Oct 01, 2023  8:51 AM Alfonso ORN wrote: Reason for CRM: patient has question regarding her medication she stated she seems to be taking a lot of medications stated she is on 7 medication  Some of the medication have question on rosuvastatin  20 mg ,buspiron 5 mg ,  duloxetine  30 mg

## 2023-10-01 NOTE — Telephone Encounter (Signed)
 FYI Only or Action Required?: Med refill  Patient was last seen in primary care on 09/18/2023 by Karna Fellows, MD.  Called Nurse Triage reporting No chief complaint on file..  Symptoms began today.  Interventions attempted: Nothing.  Symptoms are: unchanged.  Triage Disposition: No disposition on file.  Patient/caregiver understands and will follow disposition?:

## 2023-10-01 NOTE — Telephone Encounter (Signed)
 First attempt; no answer    Copied from CRM [id number]. Topic: Clinical - Lab/Test Results >> Oct 01, 2023  8:47 AM Alfonso ORN wrote: Reason for CRM: patient had lab orders last tuesday 09/18/23 and need more information would like a call to go over the labs

## 2023-10-01 NOTE — Telephone Encounter (Signed)
 Called pt, no answer; left message of office's return call.

## 2023-10-01 NOTE — Telephone Encounter (Signed)
 FYI Only or Action Required?: FYI only for provider.  Patient was last seen in primary care on 09/18/2023 by Karna Fellows, MD.  Called Nurse Triage reporting Depression.  Symptoms began today.  Interventions attempted: Nothing.  Symptoms are: unchanged.  Triage Disposition: No disposition on file.  Patient/caregiver understands and will follow disposition?:   Had questions regarding medications and directions.  This RN able to answer all of her questions.   Copied from CRM [id number]. Topic: Clinical - Red Word Triage >> Oct 01, 2023  8:53 AM Alfonso ORN wrote: Red Word that prompted transfer to Nurse Triage: depression and  loss her dog in April  Can not sleep , she hurts , do not want to go away Answer Assessment - Initial Assessment Questions 1. CONCERN: What happened that made you call today?     States sufferers from depression and now has 6 bottles of pills and doesn't know what they are used for.  Protocols used: Depression-A-AH

## 2023-10-02 ENCOUNTER — Encounter: Payer: Self-pay | Admitting: *Deleted

## 2023-10-02 NOTE — Telephone Encounter (Signed)
 My Chart message sent to pt to bring all her meds to her appt on Thursday.

## 2023-10-04 ENCOUNTER — Ambulatory Visit (INDEPENDENT_AMBULATORY_CARE_PROVIDER_SITE_OTHER)

## 2023-10-04 VITALS — BP 132/68 | HR 82 | Temp 97.9°F | Ht 61.0 in | Wt 158.6 lb

## 2023-10-04 DIAGNOSIS — E785 Hyperlipidemia, unspecified: Secondary | ICD-10-CM

## 2023-10-04 DIAGNOSIS — I10 Essential (primary) hypertension: Secondary | ICD-10-CM | POA: Diagnosis not present

## 2023-10-04 DIAGNOSIS — F330 Major depressive disorder, recurrent, mild: Secondary | ICD-10-CM

## 2023-10-04 DIAGNOSIS — N3000 Acute cystitis without hematuria: Secondary | ICD-10-CM | POA: Diagnosis not present

## 2023-10-04 DIAGNOSIS — Z79899 Other long term (current) drug therapy: Secondary | ICD-10-CM

## 2023-10-04 NOTE — Assessment & Plan Note (Signed)
 On 8/26 LDL elevated at 115 and triglycerides elevated at 170.  Started rosuvastatin  20 mg daily.  Patient brought this medication to the appointment today and states she is taking it.  Plan to continue current regimen.

## 2023-10-04 NOTE — Assessment & Plan Note (Signed)
 5 months ago patient's calcium  was 10.9. On 8/26 Ca elevated to 11, PTH normal at 19, vitamin D  25 decreased at 29.  Patient was previously taking calcium  and vitamin D  as she had a DEXA scan that showed she had osteopenia.  Discussed it is important that she stop taking vitamin D  and calcium  supplementation while we workup her hypercalcemia.  Patient denies constipation, has normal bowel movements daily, she did have diarrhea 2 days ago and treated with Imodium but this is not common for her.  It is difficult to assess bone pain as she has osteoarthritis in several places.  She denies right or left flank pain.  It does not appear patient is having symptoms from her hypercalcemia.  Differential includes but is not limited to primary hyperparathyroidism, familial hypocalciuric hypercalcemia, milk-alkali syndrome although patient says she does not drink a lot of milk.  Less likely diagnoses include malignancy associated hypercalcemia or multiple myeloma.  Plan to have patient hold off on vitamin D  and calcium  supplementation, will order the following labs to further workup hypercalcemia. Orders:   Comprehensive metabolic panel with GFR; Future   PTH, Intact and Calcium ; Future   PTH-Related Peptide; Future   Vitamin D  1,25 Dihydroxy; Future   CBC with Diff; Future

## 2023-10-04 NOTE — Progress Notes (Signed)
 Established Patient Office Visit  Subjective   Patient ID: [name], female    DOB: 01/22/1951  Age: 73 y.o. MRN: [id number]  Patient here for a follow-up, was last seen in our clinic on 8/29 for depression, hypertension, hyperlipidemia, knee pain, and hypercalcemia.  Due to several medication adjustments and lab results, patient was scheduled to come back today to recollect labs and confirm medication changes.  Patient brought all of her medications with her to today's appointment.        Objective:     BP 132/68 (BP Location: Right Arm, Patient Position: Sitting, Cuff Size: Normal)   Pulse 82   Temp 97.9 F (36.6 C) (Oral)   Ht 5' 1 (1.549 m)   Wt 158 lb 9.6 oz (71.9 kg)   SpO2 97%   BMI 29.97 kg/m  BP Readings from Last 3 Encounters:  10/04/23 132/68  09/18/23 (!) 177/74  07/19/23 (!) 124/58   Wt Readings from Last 3 Encounters:  10/04/23 158 lb 9.6 oz (71.9 kg)  09/18/23 158 lb 3.2 oz (71.8 kg)  09/05/23 154 lb (69.9 kg)      Physical Exam Vitals reviewed.  Constitutional:      Appearance: Normal appearance. She is not ill-appearing.  HENT:     Nose: Nose normal.  Eyes:     Conjunctiva/sclera: Conjunctivae normal.  Pulmonary:     Breath sounds: Normal breath sounds.  Musculoskeletal:        General: No swelling.     Comments: Abnormal gait on ambulation due to left knee pain  Skin:    General: Skin is warm.  Neurological:     General: No focal deficit present.     Mental Status: She is alert and oriented to person, place, and time.      No results found for any visits on 10/04/23.  Last lipids Lab Results  Component Value Date   CHOL 192 09/18/2023   HDL 47 09/18/2023   LDLCALC 115 (H) 09/18/2023   TRIG 170 (H) 09/18/2023   CHOLHDL 4.1 09/18/2023   Last vitamin D  Lab Results  Component Value Date   VD25OH 29.0 (L) 09/18/2023      The 10-year ASCVD risk score (Arnett DK, et al., 2019) is: 22.1%    Assessment & Plan:    Assessment & Plan Hypercalcemia 5 months ago patient's calcium  was 10.9. On 8/26 Ca elevated to 11, PTH normal at 19, vitamin D  25 decreased at 29.  Patient was previously taking calcium  and vitamin D  as she had a DEXA scan that showed she had osteopenia.  Discussed it is important that she stop taking vitamin D  and calcium  supplementation while we workup her hypercalcemia.  Patient denies constipation, has normal bowel movements daily, she did have diarrhea 2 days ago and treated with Imodium but this is not common for her.  It is difficult to assess bone pain as she has osteoarthritis in several places.  She denies right or left flank pain.  It does not appear patient is having symptoms from her hypercalcemia.  Differential includes but is not limited to primary hyperparathyroidism, familial hypocalciuric hypercalcemia, milk-alkali syndrome although patient says she does not drink a lot of milk.  Less likely diagnoses include malignancy associated hypercalcemia or multiple myeloma.  Plan to have patient hold off on vitamin D  and calcium  supplementation, will order the following labs to further workup hypercalcemia. Orders:   Comprehensive metabolic panel with GFR; Future   PTH, Intact and Calcium ;  Future   PTH-Related Peptide; Future   Vitamin D  1,25 Dihydroxy; Future   CBC with Diff; Future  Acute cystitis without hematuria Patient has had UTIs in the past and she feels like she is starting to get 1, like she is waiting for it to start.  Patient denies abnormal discharge, abnormal odor, abnormal color, but does have mild pain with urination.  Plan to order a urinalysis today and follow-up if there are signs of infection. Orders:   Urinalysis, Reflex Microscopic  Hyperlipidemia, unspecified hyperlipidemia type On 8/26 LDL elevated at 115 and triglycerides elevated at 170.  Started rosuvastatin  20 mg daily.  Patient brought this medication to the appointment today and states she is taking it.   Plan to continue current regimen.    Major depressive disorder, recurrent episode, mild (HCC) Patient is still expressing symptoms of depressed mood, lack of energy, has little pleasure in doing things, stays in bed for most of the day, does not enjoy being around people, is estranged from her daughter and grand children, severely mourning the loss of her dog.  She feels like she is a mistake and was never supposed to be here, she is a twin.  She does not like how her body has aged and feels distrust with the medical system.  Patient has passive suicidality.  We discussed the importance of medication therapy in conjunction with cognitive therapy, I encouraged patient to consider speaking with Ms. Cynthia here in our clinic.  Patient has spoken to therapist in the past and feels like it would be a waste of the therapists time because no one can fix her problems.  I encouraged the patient to reconsider and that we will schedule a close follow-up in a month to reassess.  Patient brought her duloxetine  to the office and has been taking it, she understands that she will complete 2 weeks of 30 mg daily and increase to 60 mg daily after 2 weeks.  She is also taking the buspirone  5 mg twice a day.  Plan to continue current regimen and have patient follow-up in 4 weeks to reassess cognitive behavioral therapy.  I do feel as if patient would be open to having another discussion about therapy at the next visit.  She is still very depressed over losing her dog.    Hypertension, unspecified type Patient's blood pressure was elevated at the last visit, initially 144/83 and increased to 177/74 on repeat.  At that visit she was not taking blood pressure medicine but had been on amlodipine  2.5 mg daily in the past.  Restarted amlodipine  2.5 mg at the last visit.  Patient brought this medicine with her and is taking it.  Blood pressure 132/68 today.  Plan to continue current regimen.     Return in about 4 weeks (around  11/01/2023) for fu depression and new medication .    Viktoria King, DO

## 2023-10-04 NOTE — Patient Instructions (Addendum)
 It was wonderful seeing you today!   Please remember to...  1) Continue taking your amlodipine  2.5 mg daily for your blood pressure.  2) Continue taking Duloxetine  30 mg daily to complete a full two weeks and then increase to 60 mg daily for your depression. Continue Buspirone  5 mg twice a day. Please let us  know if you'd like to try therapy with Ms. [name] at our clinic.  3) Do not take calcium  or vitamin D  supplements while we are working to figure out why your calcium  levels are high   4) Continue taking rosuvastatin  20 mg daily for your cholesterol.  5) Continue Prilosec for your reflux.   6) Continue tylenol  1000 mg every 8 hours as needed for your knee pain. Can alternate with ibuprofen 800 mg every 8 hours as needed.   7) If there's something abnormal in your labs or urine, we will call you to let you know.   I enjoyed our visit and am grateful to have met you. Hang in there and be kind to yourself. Please don't hesitate to reach out.   If you have any questions please feel free to the call the clinic at anytime at [phone].  Have a blessed day,  Dr. Charmayne

## 2023-10-05 ENCOUNTER — Ambulatory Visit: Payer: Self-pay

## 2023-10-05 LAB — MICROSCOPIC EXAMINATION
Casts: NONE SEEN /LPF
Epithelial Cells (non renal): NONE SEEN /HPF (ref 0–10)
RBC, Urine: NONE SEEN /HPF (ref 0–2)
WBC, UA: >30 /HPF — AB (ref 0–5)

## 2023-10-05 LAB — URINALYSIS, ROUTINE W REFLEX MICROSCOPIC
Bilirubin, UA: NEGATIVE
Glucose, UA: NEGATIVE
Ketones, UA: NEGATIVE
Nitrite, UA: POSITIVE — AB
Specific Gravity, UA: 1.016 (ref 1.005–1.030)
Urobilinogen, Ur: 0.2 mg/dL (ref 0.2–1.0)
pH, UA: 6 (ref 5.0–7.5)

## 2023-10-05 MED ORDER — SULFAMETHOXAZOLE-TRIMETHOPRIM 800-160 MG PO TABS: 1.0000 | ORAL_TABLET | Freq: Two times a day (BID) | ORAL | 0 refills | Status: AC

## 2023-10-05 MED ORDER — ESTRADIOL 0.1 MG/GM VA CREA: 1.0000 | TOPICAL_CREAM | Freq: Every day | VAGINAL | 6 refills | Status: DC

## 2023-10-05 NOTE — Addendum Note (Signed)
 Addended by: CHARMAYNE HOLMES on: 10/05/2023 11:46 AM   Modules accepted: Orders

## 2023-10-09 ENCOUNTER — Ambulatory Visit: Payer: Self-pay

## 2023-10-09 NOTE — Telephone Encounter (Signed)
 FYI Only or Action Required?: Action required by provider: request for appointment.  Patient was last seen in primary care on 10/04/2023 by Charmayne Holmes, DO.  Called Nurse Triage reporting Knee Pain.  Symptoms began several years ago.  Interventions attempted: OTC medications: ibuprofen.  Symptoms are: unchanged.  Triage Disposition: See PCP Within 2 Weeks  Patient/caregiver understands and will follow disposition?: YesCopied from [employer] [phone]. Topic: Clinical - Red Word Triage >> Oct 09, 2023  3:12 PM Alfonso ORN wrote: Red Word that prompted transfer to Nurse Triage: patient having pain in right knee rate 7 , bone on bone , hurts to walk on  will be having surgery sometimes this year (need an appt. for medical clearance physical having knee surgery )    ----------------------------------------------------------------------- From previous Reason for Contact - Scheduling: Patient/patient representative is calling to schedule an appointment. Refer to attachments for appointment information. Reason for Disposition  Knee pain is a chronic symptom (recurrent or ongoing AND present > 4 weeks)  Answer Assessment - Initial Assessment Questions Pt called to set up appt for medical clearance for right knee surgery.  Pt does have trouble walking.Pt wanted to see PCP.      1. LOCATION and RADIATION: Where is the pain located?      Right knee 2. QUALITY: What does the pain feel like?  (e.g., sharp, dull, aching, burning)     Sharp, achy 3. SEVERITY: How bad is the pain? What does it keep you from doing?   (Scale 1-10; or mild, moderate, severe)     7 4. ONSET: When did the pain start? Does it come and go, or is it there all the time?     Couple of years 5. RECURRENT: Have you had this pain before? If Yes, ask: When, and what happened then?     yes 6. SETTING: Has there been any recent work, exercise or other activity that involved that part of the body?      na 7.  AGGRAVATING FACTORS: What makes the knee pain worse? (e.g., walking, climbing stairs, running)     Walking  8. ASSOCIATED SYMPTOMS: Is there any swelling or redness of the knee?     Swollen  9. OTHER SYMPTOMS: Do you have any other symptoms? (e.g., calf pain, chest pain, difficulty breathing, fever)     denies  Protocols used: Knee Pain-A-AH

## 2023-10-09 NOTE — Telephone Encounter (Signed)
 RTC to patient -,message was left that the Clinics had returned her call.

## 2023-10-10 ENCOUNTER — Other Ambulatory Visit: Payer: Self-pay | Admitting: Internal Medicine

## 2023-10-10 DIAGNOSIS — K219 Gastro-esophageal reflux disease without esophagitis: Secondary | ICD-10-CM

## 2023-10-10 NOTE — Telephone Encounter (Signed)
 Medication sent to pharmacy

## 2023-10-11 ENCOUNTER — Telehealth: Payer: Self-pay | Admitting: Cardiology

## 2023-10-11 ENCOUNTER — Ambulatory Visit: Payer: Self-pay

## 2023-10-11 NOTE — Telephone Encounter (Signed)
 Patient has not been seen before therefore her first appointment we can add to the appointment note that she needs preop clearance. Not sure when the surgery is scheduled but if its sooner than 11/18 appointment would need to be rescheduled and surgeon's office needs to send over preop clearance form

## 2023-10-11 NOTE — Telephone Encounter (Signed)
 FYI Only or Action Required?: Action required by provider: clinical question for provider.  Patient was last seen in primary care on 10/04/2023 by Charmayne Holmes, DO.  Called Nurse Triage reporting Pain.  Symptoms began ongoing and worsening.  Interventions attempted: OTC medications:   and Prescription medications:  SABRA  Symptoms are: gradually worsening.  Triage Disposition: See PCP When Office is Open (Within 3 Days)  Patient/caregiver understands and will follow disposition?: No, wishes to speak with PCP    Copied from CRM #[id number]. Topic: Clinical - Red Word Triage >> Oct 11, 2023  3:54 PM Miquel SAILOR wrote: Red Word that prompted transfer to Nurse Triage: [id number] RT knee pain server can not walk due to pain.  Already has app 10/07 with PCP and had NT on 09/16. Dr. Karna stated has to see cardiology before visit which is set for 11/18 at 2:40pm instructed to call NT  Reason for Disposition  [1] MODERATE pain (e.g., interferes with normal activities, limping) AND [2] present > 3 days  Answer Assessment - Initial Assessment Questions 1. ONSET: When did the pain start?      Ongoing and worsening 2. LOCATION: Where is the pain located?      Right knee pain 3. PAIN: How bad is the pain?    (Scale 1-10; or mild, moderate, severe)     severe 4. WORK OR EXERCISE: Has there been any recent work or exercise that involved this part of the body?      na 5. CAUSE: What do you think is causing the leg pain?     unknown 6. OTHER SYMPTOMS: Do you have any other symptoms? (e.g., chest pain, back pain, breathing difficulty, swelling, rash, fever, numbness, weakness)     na 7. PREGNANCY: Is there any chance you are pregnant? When was your last menstrual period?     Na  Pt needs something for pain: does not want anything with codeine  in it.  Pt does not want anything to harsh/medication but needs something for pain. Pt states pain is high and walking makes it worse.   Patient is currently taking 8 Advil tablet for pain (4 tablets = 800 mg)   every several hours to help with pain.  Protocols used: Leg Pain-A-AH

## 2023-10-11 NOTE — Progress Notes (Signed)
 Internal Medicine Clinic Attending  I was physically present during the key portions of the resident provided service and participated in the medical decision making of patient's management care. I reviewed pertinent patient test results.  The assessment, diagnosis, and plan were formulated together and I agree with the documentation in the resident's note.  Shawn Sick, MD

## 2023-10-11 NOTE — Telephone Encounter (Signed)
 Pt was referred by her ortho office. I scheduled her for a NP appt, but would like to know if she could be scheduled for a preop appt as well. Please advise.

## 2023-10-12 NOTE — Telephone Encounter (Signed)
 Returned patient's call. She is overwhelmed by recent changes to health and worried about hypertension, possible heart disease, upcoming surgery. We discussed continuing to work on each piece step-by-step. She is on a cancellation list for cardiology appt and will call to see if there are earlier appointments. Discussed knee pain and surgery as the next best step. No longer taking tylenol -codeine  and not interested in resuming due to rash. She noted on triage note that she is not interested in anything stronger and today notes she is always worried to call about pain as she has a tendency to be addicted and doesn't want to be labeled a drug-seeker. Recommend Tylenol  1000 tid, Advil no more than 800 mg tid w/ food and PPI, Voltaren  gel qid. Just started taking duloxetine  and will increase dose, reviewed this is to help with pain and depression. Previously on Lyrica  and offered to resume bid, however she is concerned about the number of pills she takes so will hold off for now. She was grateful for the call and reassurance. Eager to adopt a new dog once she recovers from surgery.

## 2023-10-16 ENCOUNTER — Ambulatory Visit: Payer: Self-pay | Admitting: Internal Medicine

## 2023-10-18 LAB — COMPREHENSIVE METABOLIC PANEL WITH GFR
ALT: 14 IU/L (ref 0–32)
AST: 15 IU/L (ref 0–40)
Albumin: 4.4 g/dL (ref 3.8–4.8)
Alkaline Phosphatase: 121 IU/L (ref 44–121)
BUN/Creatinine Ratio: 20 (ref 12–28)
BUN: 15 mg/dL (ref 8–27)
Bilirubin Total: 0.3 mg/dL (ref 0.0–1.2)
CO2: 22 mmol/L (ref 20–29)
Calcium: 10.4 mg/dL — ABNORMAL HIGH (ref 8.7–10.3)
Chloride: 101 mmol/L (ref 96–106)
Creatinine, Ser: 0.74 mg/dL (ref 0.57–1.00)
Globulin, Total: 2.7 g/dL (ref 1.5–4.5)
Glucose: 89 mg/dL (ref 70–99)
Potassium: 4 mmol/L (ref 3.5–5.2)
Sodium: 137 mmol/L (ref 134–144)
Total Protein: 7.1 g/dL (ref 6.0–8.5)
eGFR: 84 mL/min/1.73 (ref >59)

## 2023-10-18 LAB — CBC WITH DIFFERENTIAL/PLATELET
Basophils Absolute: 0.1 x10E3/uL (ref 0.0–0.2)
Basos: 1 %
EOS (ABSOLUTE): 0.2 x10E3/uL (ref 0.0–0.4)
Eos: 2 %
Hematocrit: 36.6 % (ref 34.0–46.6)
Hemoglobin: 11.7 g/dL (ref 11.1–15.9)
Immature Grans (Abs): 0.1 x10E3/uL (ref 0.0–0.1)
Immature Granulocytes: 1 %
Lymphocytes Absolute: 3.4 x10E3/uL — ABNORMAL HIGH (ref 0.7–3.1)
Lymphs: 39 %
MCH: 30.1 pg (ref 26.6–33.0)
MCHC: 32 g/dL (ref 31.5–35.7)
MCV: 94 fL (ref 79–97)
Monocytes Absolute: 0.6 x10E3/uL (ref 0.1–0.9)
Monocytes: 7 %
Neutrophils Absolute: 4.4 x10E3/uL (ref 1.4–7.0)
Neutrophils: 50 %
Platelets: 317 x10E3/uL (ref 150–450)
RBC: 3.89 x10E6/uL (ref 3.77–5.28)
RDW: 12.1 % (ref 11.7–15.4)
WBC: 8.6 x10E3/uL (ref 3.4–10.8)

## 2023-10-18 LAB — VITAMIN D 1,25 DIHYDROXY
Vitamin D 1, 25 (OH)2 Total: 30 pg/mL
Vitamin D2 1, 25 (OH)2: <10 pg/mL
Vitamin D3 1, 25 (OH)2: 30 pg/mL

## 2023-10-18 LAB — PTH-RELATED PEPTIDE: PTH-related peptide: <2 pmol/L

## 2023-10-18 LAB — PTH, INTACT AND CALCIUM: PTH: 26 pg/mL (ref 15–65)

## 2023-10-19 MED ORDER — ALPRAZOLAM 0.5 MG PO TABS: ORAL_TABLET | ORAL | 0 refills | Status: DC

## 2023-10-19 NOTE — Addendum Note (Signed)
 Addended by: CHARMAYNE HOLMES on: 10/19/2023 01:43 PM   Modules accepted: Orders

## 2023-10-19 NOTE — Addendum Note (Signed)
 Addended by: CHARMAYNE HOLMES on: 10/19/2023 01:21 PM   Modules accepted: Orders

## 2023-10-24 ENCOUNTER — Other Ambulatory Visit

## 2023-10-24 ENCOUNTER — Telehealth: Payer: Self-pay | Admitting: *Deleted

## 2023-10-24 NOTE — Telephone Encounter (Signed)
 Closed as all information had been given.  Reason for CRM: Patient needed address to location. Gave info Instructed by PCP ok to walk in for Labs

## 2023-10-27 LAB — PE AND FLC, SERUM
Ig Kappa Free Light Chain: 23.2 mg/L — ABNORMAL HIGH (ref 3.3–19.4)
Ig Lambda Free Light Chain: 18.2 mg/L (ref 5.7–26.3)
KAPPA/LAMBDA RATIO: 1.27 (ref 0.26–1.65)
Total Protein: 7 g/dL (ref 6.0–8.5)

## 2023-10-27 LAB — MULTIPLE MYELOMA PANEL, SERUM
Albumin SerPl Elph-Mcnc: 3.7 g/dL (ref 2.9–4.4)
Albumin/Glob SerPl: 1.2 (ref 0.7–1.7)
Alpha 1: 0.3 g/dL (ref 0.0–0.4)
Alpha2 Glob SerPl Elph-Mcnc: 0.8 g/dL (ref 0.4–1.0)
B-Globulin SerPl Elph-Mcnc: 1.2 g/dL (ref 0.7–1.3)
Gamma Glob SerPl Elph-Mcnc: 1 g/dL (ref 0.4–1.8)
Globulin, Total: 3.3 g/dL (ref 2.2–3.9)
IgA/Immunoglobulin A, Serum: 369 mg/dL (ref 64–422)
IgG (Immunoglobin G), Serum: 1038 mg/dL (ref 586–1602)
IgM (Immunoglobulin M), Srm: 88 mg/dL (ref 26–217)

## 2023-10-30 ENCOUNTER — Other Ambulatory Visit: Payer: Self-pay | Admitting: Internal Medicine

## 2023-10-30 ENCOUNTER — Encounter: Payer: Self-pay | Admitting: Internal Medicine

## 2023-10-30 ENCOUNTER — Ambulatory Visit (INDEPENDENT_AMBULATORY_CARE_PROVIDER_SITE_OTHER): Admitting: Internal Medicine

## 2023-10-30 DIAGNOSIS — M4800 Spinal stenosis, site unspecified: Secondary | ICD-10-CM | POA: Diagnosis not present

## 2023-10-30 DIAGNOSIS — F330 Major depressive disorder, recurrent, mild: Secondary | ICD-10-CM

## 2023-10-30 DIAGNOSIS — M25561 Pain in right knee: Secondary | ICD-10-CM | POA: Diagnosis not present

## 2023-10-30 DIAGNOSIS — G8929 Other chronic pain: Secondary | ICD-10-CM

## 2023-10-30 DIAGNOSIS — M4805 Spinal stenosis, thoracolumbar region: Secondary | ICD-10-CM

## 2023-10-30 DIAGNOSIS — Z79899 Other long term (current) drug therapy: Secondary | ICD-10-CM

## 2023-10-30 MED ORDER — PREGABALIN 25 MG PO CAPS: 25.0000 mg | ORAL_CAPSULE | Freq: Two times a day (BID) | ORAL | 0 refills | Status: DC

## 2023-10-30 NOTE — Assessment & Plan Note (Addendum)
 Elevated calcium  with low-normal PTH levels. Normal PTHrP, 1,25-vit D. No evidence of multiple myeloma on SPEP/FLC. No obvious malignant cause. Declined mammogram and CRC screening. No reported breast masses. No LAD on exam. CBC with mild lymphocytosis at prior visit, no other abnormality. [facility]heck [facility]. 24-hr calcium  pending. Referred to endocrinology at prior visit given atypical levels without clear etiology.

## 2023-10-30 NOTE — Assessment & Plan Note (Signed)
 Chronic. Referred to orthopedic surgery at previous visit for consideration of knee replacement. Recommended by Dr. Ernie, however needs cardiac clearance first and awaiting first appointment. No longer using Tylenol  #3 and did not wish to resume. I do not think chronic opiates are a good option given depression and previous patient statements of wishing to avoid narcotics d/t dependence risk and concern about addictive personality. We reviewed Cymbalta  and appropriate ibuprofen use. Continue to recommend topical therapy as well although she feels this is ineffective. Previously was taking pregabalin  but she is not sure she is doing so now. Discussed starting low dose bid and titrating as tolerated. We also discussed referral to PM&R for further medication management and consideration of nerve ablation.  Plan -Referral to PM&R for consideration of nerve ablation -Continue f/u with orthopedics for surgery discussion pending cardiac clearance -Continue duloxetine  60 mg daily; add pregabalin  25 mg bid -Discussed appropriate ibuprofen dosing

## 2023-10-30 NOTE — Assessment & Plan Note (Addendum)
 Intermittent back pain occurring with activity, recently exacerbated while making the bed. Duloxetine , ibuprofen, and pregabalin  as outlined under knee pain.

## 2023-10-30 NOTE — Progress Notes (Signed)
 Established Patient Office Visit  Subjective   Patient ID: [name], female    DOB: 1950/08/23  Age: 73 y.o. MRN: [id number]  Chief Complaint  Patient presents with   Right Knee Pain    Pain and pops when walking.   [name] returns to clinic for f/u of chronic right knee pain. During today's visit, we also discussed ongoing evaluation for hypercalcemia. Please see assessment/plan in problem-based charting for further details of today's visit.   Patient Active Problem List   Diagnosis Date Noted   Irregular heart rhythm 09/18/2023   Screening for colon cancer 04/17/2023   Cholelithiases 04/17/2023   Hypercalcemia 06/06/2022   Spinal stenosis 02/24/2022   Osteopenia 11/18/2021   Vitamin D  deficiency 04/18/2019   Substernal chest pain 04/18/2019   Obesity (BMI 30-39.9) 03/09/2017   Knee pain, right 07/01/2014   GERD (gastroesophageal reflux disease) 05/28/2014   Pre-diabetes 12/17/2013   Generalized anxiety disorder 02/19/2013   Moderately severe recurrent major depression (HCC) 04/22/2012   Hyperlipidemia 02/20/2008   HYPERTENSION, BENIGN ESSENTIAL 10/23/2007   Insomnia 03/28/2006     Objective:     BP 134/77 (BP Location: Right Arm, Patient Position: Sitting, Cuff Size: Large)   Pulse 79   Temp 97.6 F (36.4 C) (Oral)   Ht 5' 1 (1.549 m)   Wt 163 lb (73.9 kg)   SpO2 100% Comment: RA  BMI 30.80 kg/m  BP Readings from Last 3 Encounters:  10/30/23 134/77  10/04/23 132/68  09/18/23 (!) 177/74   Wt Readings from Last 3 Encounters:  10/30/23 163 lb (73.9 kg)  10/04/23 158 lb 9.6 oz (71.9 kg)  09/18/23 158 lb 3.2 oz (71.8 kg)    Physical Exam Constitutional:      General: She is not in acute distress.    Appearance: Normal appearance. She is not ill-appearing.  Neck:     Comments: No thyromegaly or nodularity noted Pulmonary:     Effort: Pulmonary effort is normal.  Lymphadenopathy:     Cervical: No cervical adenopathy.     Upper Body:      Right upper body: No supraclavicular or axillary adenopathy.     Left upper body: No supraclavicular or axillary adenopathy.  Neurological:     Mental Status: She is alert. Mental status is at baseline.  Psychiatric:     Comments: At baseline      Assessment & Plan:   Problem List Items Addressed This Visit       Other   Spinal stenosis (Chronic)   Intermittent back pain occurring with activity, recently exacerbated while making the bed. Duloxetine , ibuprofen, and pregabalin  as outlined under knee pain.      Relevant Medications   pregabalin  (LYRICA ) 25 MG capsule   Knee pain, right (Chronic)   Chronic. Referred to orthopedic surgery at previous visit for consideration of knee replacement. Recommended by Dr. Ernie, however needs cardiac clearance first and awaiting first appointment. No longer using Tylenol  #3 and did not wish to resume. I do not think chronic opiates are a good option given depression and previous patient statements of wishing to avoid narcotics d/t dependence risk and concern about addictive personality. We reviewed Cymbalta  and appropriate ibuprofen use. Continue to recommend topical therapy as well although she feels this is ineffective. Previously was taking pregabalin  but she is not sure she is doing so now. Discussed starting low dose bid and titrating as tolerated. We also discussed referral to PM&R for further medication management and  consideration of nerve ablation.  Plan -Referral to PM&R for consideration of nerve ablation -Continue f/u with orthopedics for surgery discussion pending cardiac clearance -Continue duloxetine  60 mg daily; add pregabalin  25 mg bid -Discussed appropriate ibuprofen dosing      Relevant Medications   pregabalin  (LYRICA ) 25 MG capsule   Other Relevant Orders   Ambulatory referral to Physical Medicine Rehab   Hypercalcemia - Primary   Elevated calcium  with low-normal PTH levels. Normal PTHrP, 1,25-vit D. No evidence of  multiple myeloma on SPEP/FLC. No obvious malignant cause. Declined mammogram and CRC screening. No reported breast masses. No LAD on exam. CBC with mild lymphocytosis at prior visit, no other abnormality. Will recheck CBC and look at [facility] today. 24-hr calcium  pending. Referred to endocrinology at prior visit given atypical levels without clear etiology.       Relevant Orders   TSH Rfx on Abnormal to Free T4   CBC with Diff   Calcium , Urine, 24 Hour    No follow-ups on file.    Ronnald Sergeant, MD

## 2023-10-30 NOTE — Patient Instructions (Addendum)
 It was wonderful to see you today!  *For exercise classes (in-person and online), group activities, and more* (50+)            [email or web address]  Please contact your pharmacy about scheduling the shingles (Shingrix) vaccines.

## 2023-10-30 NOTE — Telephone Encounter (Unsigned)
 Copied from CRM #[id number]. Topic: Clinical - Medication Refill >> Oct 30, 2023 10:49 AM Chiquita SQUIBB wrote: Medication:  ALPRAZolam  ALPRAZolam  (XANAX ) 0.5 MG tablet   Has the patient contacted their pharmacy? Yes (Agent: If no, request that the patient contact the pharmacy for the refill. If patient does not wish to contact the pharmacy document the reason why and proceed with request.) (Agent: If yes, when and what did the pharmacy advise?)  This is the patient's preferred pharmacy:  [facility] [address], [place] - [address] [place] [address] Phone: [phone] Fax: [phone]  Is this the correct pharmacy for this prescription? Yes If no, delete pharmacy and type the correct one.   Has the prescription been filled recently? No  Is the patient out of the medication? No- Patient has a few left  Has the patient been seen for an appointment in the last year OR does the patient have an upcoming appointment? Yes  Can we respond through MyChart? Yes  Agent: Please be advised that Rx refills may take up to 3 business days. We ask that you follow-up with your pharmacy.

## 2023-10-31 ENCOUNTER — Telehealth: Payer: Self-pay | Admitting: *Deleted

## 2023-10-31 ENCOUNTER — Ambulatory Visit: Payer: Self-pay | Admitting: Internal Medicine

## 2023-10-31 LAB — TSH RFX ON ABNORMAL TO FREE T4: TSH: 2.29 u[IU]/mL (ref 0.450–4.500)

## 2023-10-31 LAB — CBC WITH DIFFERENTIAL/PLATELET
Basophils Absolute: 0.1 x10E3/uL (ref 0.0–0.2)
Basos: 1 %
EOS (ABSOLUTE): 0.2 x10E3/uL (ref 0.0–0.4)
Eos: 2 %
Hematocrit: 37 % (ref 34.0–46.6)
Hemoglobin: 11.6 g/dL (ref 11.1–15.9)
Immature Grans (Abs): 0 x10E3/uL (ref 0.0–0.1)
Immature Granulocytes: 0 %
Lymphocytes Absolute: 3 x10E3/uL (ref 0.7–3.1)
Lymphs: 36 %
MCH: 29.9 pg (ref 26.6–33.0)
MCHC: 31.4 g/dL — ABNORMAL LOW (ref 31.5–35.7)
MCV: 95 fL (ref 79–97)
Monocytes Absolute: 0.5 x10E3/uL (ref 0.1–0.9)
Monocytes: 6 %
Neutrophils Absolute: 4.4 x10E3/uL (ref 1.4–7.0)
Neutrophils: 55 %
Platelets: 272 x10E3/uL (ref 150–450)
RBC: 3.88 x10E6/uL (ref 3.77–5.28)
RDW: 12.6 % (ref 11.7–15.4)
WBC: 8.2 x10E3/uL (ref 3.4–10.8)

## 2023-10-31 MED ORDER — ALPRAZOLAM 0.5 MG PO TABS: ORAL_TABLET | ORAL | 0 refills | Status: DC

## 2023-10-31 NOTE — Telephone Encounter (Signed)
 Last refilled 10/19/23.

## 2023-10-31 NOTE — Telephone Encounter (Signed)
 Pt was called / informed of Dr Terrea response. Pt stated ok and she will take some Advil.

## 2023-10-31 NOTE — Progress Notes (Signed)
 TSH wnl. CBC stable, lymphocytosis resolved.

## 2023-10-31 NOTE — Telephone Encounter (Signed)
 Copied from CRM [id number]. Topic: Clinical - Medication Question >> Oct 31, 2023  1:33 PM [name] wrote: Reason for CRM: patient calling to ask if her provider could prescribe her a prescription for the pain with her leg since she can't take the codeine  due to the itchiness/ rash it causes, and she doesn't want to take the ibuprofen so often to upset her stomach, she is hoping the medication could relieve the pain so she can get some sleep   She is ask for the prescription for the leg pain since her consultation appointment isn't until  Dec 11, 2023 At 2:40 pm   Pt num [phone] [facility])  [facility] [address] [place], [place] - [address] [place] [address] Phone: [phone] Fax: [phone]

## 2023-11-01 LAB — CALCIUM, URINE, 24 HOUR
Calcium, 24H Urine: 240 mg/(24.h) (ref 0–320)
Calcium, Urine: 7.5 mg/dL

## 2023-11-02 NOTE — Progress Notes (Signed)
 Urine calcium  levels higher than anticipated for [facility] but not as high as would expect in PHPT. Referral placed to endocrinology at previous visit for further evaluation of hypercalcemia.

## 2023-11-07 ENCOUNTER — Other Ambulatory Visit: Payer: Self-pay | Admitting: Internal Medicine

## 2023-11-07 DIAGNOSIS — F330 Major depressive disorder, recurrent, mild: Secondary | ICD-10-CM

## 2023-11-07 NOTE — Telephone Encounter (Unsigned)
 Copied from CRM #[id number]. Topic: Clinical - Medication Refill >> Nov 07, 2023  2:01 PM Miquel SAILOR wrote: Medication: ALPRAZolam  (XANAX ) 0.5 MG tablet  Has the patient contacted their pharmacy? Yes (Agent: If no, request that the patient contact the pharmacy for the refill. If patient does not wish to contact the pharmacy document the reason why and proceed with request.) (Agent: If yes, when and what did the pharmacy advise?)  This is the patient's preferred pharmacy:  [facility] [address], [place] - [address] [place] [address] Phone: [phone] Fax: [phone]  Is this the correct pharmacy for this prescription? Yes If no, delete pharmacy and type the correct one.   Has the prescription been filled recently? Yes  Is the patient out of the medication? No 5 ills left   Has the patient been seen for an appointment in the last year OR does the patient have an upcoming appointment? Yes  Can we respond through MyChart? Yes  Agent: Please be advised that Rx refills may take up to 3 business days. We ask that you follow-up with your pharmacy.

## 2023-11-08 NOTE — Telephone Encounter (Signed)
 Dr Karna wrote Rx to be filled on 10/25.   This will be available then.  Patient last had Rx written on 9/26.  My Chart Message sent to patient

## 2023-11-13 ENCOUNTER — Ambulatory Visit: Payer: Self-pay

## 2023-11-13 NOTE — Telephone Encounter (Signed)
 FYI Only or Action Required?: Action required by provider: medication refill request.  Patient was last seen in primary care on 10/30/2023 by Karna Fellows, MD.  Called Nurse Triage reporting Medication Refill.  Triage Disposition: Call PCP When Office is Open  Patient/caregiver understands and will follow disposition?: Yes        Copied from CRM [id number]. Topic: Clinical - Red Word Triage >> Nov 13, 2023  4:23 PM [name] wrote: Red Word that prompted transfer to Nurse Triage: Patient is trying to get her medication, ALPRAZolam  (XANAX ) 0.5 MG tablet refilled earlier than 10/25. States she was starting to have withdrawals and now she's feeling sick & shaky.         Reason for Disposition  Caller requesting a CONTROLLED substance prescription refill (e.g., narcotics, ADHD medicines)  Answer Assessment - Initial Assessment Questions Patient took her last Xanax  today and is set to be refilled on 10/25, and would like to see if she could get it refilled prior to that if possible, please advise.      1. DRUG NAME: What medicine do you need to have refilled?     Alprazolam   2. REFILLS REMAINING: How many refills are remaining? Notes: The label on the medicine or pill bottle will show how many refills are remaining. If there are no refills remaining, then a renewal may be needed.     None 3. EXPIRATION DATE: What is the expiration date? Note: The label states when the prescription will expire, and thus can no longer be refilled.)     N/A 4. PRESCRIBER: Who prescribed it? Note: The prescribing doctor or group is responsible for refill approvals..     Dr. Karna 6. SYMPTOMS: Do you have any symptoms?     Feeling shaky and increased anxiety  Protocols used: Medication Refill and Renewal Call-A-AH

## 2023-11-14 ENCOUNTER — Other Ambulatory Visit: Payer: Self-pay | Admitting: Internal Medicine

## 2023-11-14 DIAGNOSIS — F330 Major depressive disorder, recurrent, mild: Secondary | ICD-10-CM

## 2023-11-14 MED ORDER — ALPRAZOLAM 0.5 MG PO TABS: ORAL_TABLET | ORAL | 0 refills | Status: DC

## 2023-11-14 NOTE — Telephone Encounter (Signed)
 Refilled to avoid withdrawal. Patient reported at previous visits that she usually takes 1-2 tablets/day therefore prescription was decreased from 120 tablets > 60 tablets/month earlier this year. If early requests are a consistent issue, she will need a f/u appointment to discuss use.

## 2023-11-14 NOTE — Telephone Encounter (Signed)
 Call to patient informed her that  her refill for Xanax  has been sent in.  Need to schedule follow up appointment with PCP to discuss dosing.  Appointment was scheduled with Dr. Karna for 12/18/2023 at 9:15 AM.

## 2023-11-14 NOTE — Telephone Encounter (Signed)
 RTC to patient states she took her last Xanax  last night.  Asked how often she takes the medication.  Stated that she sometimes takes it 3 times a day.  Would like to pick up the refill before the 25th.

## 2023-11-16 ENCOUNTER — Ambulatory Visit: Payer: Self-pay

## 2023-11-16 NOTE — Telephone Encounter (Signed)
 FYI Only or Action Required?: Action required by provider: update on patient condition and xanax  refill request.  Patient was last seen in primary care on 10/30/2023 by Karna Fellows, MD.  Called Nurse Triage reporting withdrawl symptoms. anxiety  Symptoms began several days ago.  Interventions attempted: Other: taking half doses of xanax .  Symptoms are: gradually worsening.  Triage Disposition: Go to ED Now (or PCP Triage)  Patient/caregiver understands and will follow disposition?: No, refuses disposition  Copied from CRM [id number]. Topic: Clinical - Red Word Triage >> Nov 16, 2023 12:42 PM Cherylann RAMAN wrote: Red Word that prompted transfer to Nurse Triage:  Patient is trying to get her medication, ALPRAZolam  (XANAX ) 0.5 MG tablet refilled earlier than 10/25. States she was starting to have withdrawals and now she's feeling sick & shaky. Patient states that she is out of the medication. Requesting to speak with NT Reason for Disposition  [1] Alcohol or drug use, known or suspected AND [2] feeling very shaky (i.e., visible tremors of hands)  Answer Assessment - Initial Assessment Questions Attempted to contact [facility], office at lunch  Dr. Karna documented on 10/22 that xanax  should be refilled to prevent withdrawal symptoms.   Xanax  scheduled to be refilled on 10/25   Can medication be refilled sooner?  Patient experiencing shakiness, diarrhea, and vomiting yesterday and this morning  Patient reports that she did not sleep overnight    1. CONCERN: Did anything happen that prompted you to call today?      Patient is very anxious.  Last full dose of xanax , 0.5 mg was taken this week on Wednesday.  She has been taking half doses of xanax  to cover her until it can be refilled  Patient states that her dog died in 06/09/2023, she lives at home alone and and is nervous and fearful 2. ANXIETY SYMPTOMS: Can you describe how you (your loved one; patient) have been feeling? (e.g., tense, restless,  panicky, anxious, keyed up, overwhelmed, sense of impending doom).      Tense, panicky, anxious, overwhelmed 3. ONSET: How long have you been feeling this way? (e.g., hours, days, weeks)     Months ago 4. SEVERITY: How would you rate the level of anxiety? (e.g., 0 - 10; or mild, moderate, severe).     Moderate to sever 5. FUNCTIONAL IMPAIRMENT: How have these feelings affected your ability to do daily activities? Have you had more difficulty than usual doing your normal daily activities? (e.g., getting better, same, worse; self-care, school, work, interactions)     Moderate functional impairment.  Patient states that it has been a week since she has been dressed and out of bed.  States that she does not feel like doing anything and that her back hurts 6. HISTORY: Have you felt this way before? Have you ever been diagnosed with an anxiety problem in the past? (e.g., generalized anxiety disorder, panic attacks, PTSD). If Yes, ask: How was this problem treated? (e.g., medicines, counseling, etc.)     History of anxiety 7. RISK OF HARM - SUICIDAL IDEATION: Do you ever have thoughts of hurting or killing yourself? If Yes, ask:  Do you have these feelings now? Do you have a plan on how you would do this?     No plans to hurt herself at the time of call 8. TREATMENT:  What has been done so far to treat this anxiety? (e.g., medicines, relaxation strategies). What has helped?     xanax  9. THERAPIST: Do you have a counselor  or therapist? If Yes, ask: What is their name?     Has had a counselor in the past, but has not   11. PATIENT SUPPORT: Who is with you now? Who do you live with? Do you have family or friends who you can talk to?        Lives alone, family lives in another state.  No friends 12. OTHER SYMPTOMS: Do you have any other symptoms? (e.g., feeling depressed, trouble concentrating, trouble sleeping, trouble breathing, palpitations or fast heartbeat, chest  pain, sweating, nausea, or diarrhea)       Difficulty sleeping.  Depression.  States that she has nothing to get up for since her dog died  Protocols used: Anxiety and Panic Attack-A-AH

## 2023-11-19 NOTE — Telephone Encounter (Signed)
 Talked with patient.  Offered option of seeing another doctor refused.  Will only see Dr. Karna. Is willing to wait for appointment later in November.

## 2023-11-21 ENCOUNTER — Telehealth: Payer: Self-pay | Admitting: *Deleted

## 2023-11-21 NOTE — Telephone Encounter (Unsigned)
 Copied from CRM [id number]. Topic: Clinical - Lab/Test Results >> Nov 20, 2023  4:11 PM Susanna ORN wrote: Reason for CRM: Patient called stating that she received a letter in the mail from [facility] in regards to her labs. States she called them and they told her to contact her PCP's office to get the results. Patient also mentioned something about a referral to the endocrinologist as well. States she looked in her MyChart and doesn't see any results and states she wouldn't understand them anyway. Please give patient a call back to discuss labs with her. CB #: [id number].

## 2023-11-22 NOTE — Telephone Encounter (Signed)
 Called pt - no answer; call cannot be completed. I will send a My Chart message.

## 2023-11-26 ENCOUNTER — Encounter: Payer: Self-pay | Admitting: Radiology

## 2023-12-03 ENCOUNTER — Other Ambulatory Visit: Payer: Self-pay | Admitting: Internal Medicine

## 2023-12-03 DIAGNOSIS — G8929 Other chronic pain: Secondary | ICD-10-CM

## 2023-12-03 DIAGNOSIS — M4805 Spinal stenosis, thoracolumbar region: Secondary | ICD-10-CM

## 2023-12-10 ENCOUNTER — Encounter (HOSPITAL_BASED_OUTPATIENT_CLINIC_OR_DEPARTMENT_OTHER): Payer: Self-pay

## 2023-12-10 NOTE — Progress Notes (Unsigned)
 Cardiology Office Note:    Date:  12/11/2023   ID:  [name], DOB Dec 22, 1950, MRN [id number]  PCP:  Karna Fellows, MD  Cardiologist:  None  Electrophysiologist:  None   Referring MD: Karna Fellows, MD   Chief Complaint  Patient presents with   Chest Pain    History of Present Illness:    [name] is a 73 y.o. female with a hx of hypertension, hyperlipidemia who is referred by Dr. Karna for evaluation of chest pain.  She reports she has been having chest pain, often occurs at night when lying down.  Reports pain is on left side of chest, feels dull aching pain.  Typically 3 out of 10 in intensity.  Lasts for about a minute.  She does not exercise due to knee pain.  Reports some lightheadedness but denies any syncope.  Denies any palpitations.  No smoking history.  Family history includes half sister died of MI in 22s and mother had MI at age 57.   Past Medical History:  Diagnosis Date   Anxiety    Arthritis    Chronic sinusitis 04/30/2013   Depression    Diverticulosis 04/19/2011   Dx on colonoscopy in 05/2006     GERD (gastroesophageal reflux disease)    Hypercalcemia 06/06/2022   PONV (postoperative nausea and vomiting)    Routine adult health maintenance 11/06/2011   Colonoscopy: 2010 - diverticulosis, no polyps  DEXA - 2008, osteopenia in the hip.  Due to diet, would recommend repeat and Ca/Vit D supplementation  MMG - 04/2011, negative  PAP - 05/2011, inadequate, needs discussion     Flu: 09/2012  Pneumovax at 73yo  Tetanus: needs updating  Zostavax: due, to discuss     Vegetarian diet 08/18/2011    Past Surgical History:  Procedure Laterality Date   BUNIONECTOMY     bilateral   FRONTAL SINUS EXPLORATION Left 09/15/2014   Procedure: LEFT ENDOSCOPIC FRONTAL RECESS  EXPLORATION WITH FUSION IMAGE GUIDANCE NAVIGATION;  Surgeon: Daniel Moccasin, MD;  Location: [facility];  Service: ENT;  Laterality: Left;   SEPTOPLASTY Bilateral 10/20/2013   Procedure:  SEPTOPLASTY;  Surgeon: Ana LELON Moccasin, MD;  Location: [facility];  Service: ENT;  Laterality: Bilateral;   SINUS ENDO W/FUSION Bilateral 10/20/2013   Procedure: ENDOSCOPIC SINUS SURGERY WITH FUSION NAVIGATION, BILATERAL MAXILLARY ANTROSTOMIES, BILATERAL ETHMOIDECTOMIES, BILATERAL SPHENOIDECTOMIES, BILATERAL FRONTAL RECESS EXPLORATION;  Surgeon: Ana LELON Moccasin, MD;  Location: [facility];  Service: ENT;  Laterality: Bilateral;   SINUS ENDO WITH FUSION Left 09/15/2014   Procedure: LEFT ENDOSCOPIC TOTAL ETHMOIDECTOMY WITH FUSION IMAGE GUIDANCE NAVIGATION;  Surgeon: Daniel Moccasin, MD;  Location: [facility];  Service: ENT;  Laterality: Left;   SPHENOIDECTOMY Right 09/15/2014   Procedure: RIGHT ENDOSCOPIC SPHENOIDECTOMY WITH FUSION IMAGE GUIDANCE NAVIGATION;  Surgeon: Daniel Moccasin, MD;  Location: [facility];  Service: ENT;  Laterality: Right;    Current Medications: Current Meds  Medication Sig   ALPRAZolam  (XANAX ) 0.5 MG tablet TAKE 1 TO 2 TABLETS BY MOUTH ONCE DAILY AS NEEDED FOR ANXIETY   amLODipine  (NORVASC ) 2.5 MG tablet Take 2.5 mg by mouth daily.   BIOTIN PO Take by mouth.   busPIRone  (BUSPAR ) 5 MG tablet Take 1 tablet (5 mg total) by mouth 2 (two) times daily.   diclofenac  Sodium (VOLTAREN ) 1 % GEL Apply 4 g topically 4 (four) times daily.   DULoxetine  (CYMBALTA ) 30 MG capsule Take 1 capsule (30 mg) by  mouth daily for two weeks, then increase to 2 capsules (60 mg) by mouth daily.   fluticasone  (FLONASE ) 50 MCG/ACT nasal spray USE 1 SPRAY(S) IN EACH NOSTRIL ONCE DAILY   methocarbamol  (ROBAXIN ) 500 MG tablet TAKE 1 TABLET BY MOUTH EVERY 8 HOURS AT NIGHT AS NEEDED FOR MUSCLE SPASM   metoprolol tartrate (LOPRESSOR) 50 MG tablet Take 1 tablet (50 mg total) by mouth once for 1 dose. Take 90-120 minutes prior to scan. Hold for SBP less than 110.   omeprazole  (PRILOSEC) 20 MG capsule Take 1 capsule by mouth once daily   pregabalin  (LYRICA ) 25 MG capsule Take 1  capsule by mouth twice daily   rosuvastatin  (CRESTOR ) 20 MG tablet Take 1 tablet (20 mg total) by mouth daily.   TURMERIC CURCUMIN PO Take by mouth.     Allergies:   Codeine , Penicillins, Cephalexin, and Lamictal  [lamotrigine ]   Social History   Socioeconomic History   Marital status: Single    Spouse name: Not on file   Number of children: Not on file   Years of education: Not on file   Highest education level: Not on file  Occupational History   Not on file  Tobacco Use   Smoking status: Never   Smokeless tobacco: Never  Vaping Use   Vaping status: Never Used  Substance and Sexual Activity   Alcohol use: Not Currently    Alcohol/week: 0.0 standard drinks of alcohol   Drug use: No   Sexual activity: Not Currently  Other Topics Concern   Not on file  Social History Narrative   Current Social History 05/27/2020        Patient lives alone in an apartment which is 1 story. There are not steps up to the entrance.       Patient's method of transportation is personal car.      The highest level of education was some college.      The patient currently retired.      Identified important Relationships are My dog       Pets : dog passed away       Interests / Fun: being with my dog, I don't like people       Current Stressors: poor family relationships       Religious / Personal Beliefs: I believe in God      Social Drivers of Health   Financial Resource Strain: Low Risk  (09/05/2023)   Overall Financial Resource Strain (CARDIA)    Difficulty of Paying Living Expenses: Not hard at all  Food Insecurity: No Food Insecurity (09/05/2023)   Hunger Vital Sign    Worried About Running Out of Food in the Last Year: Never true    Ran Out of Food in the Last Year: Never true  Transportation Needs: No Transportation Needs (09/05/2023)   PRAPARE - Administrator, Civil Service (Medical): No    Lack of Transportation (Non-Medical): No  Physical Activity: Inactive  (09/05/2023)   Exercise Vital Sign    Days of Exercise per Week: 0 days    Minutes of Exercise per Session: 0 min  Stress: No Stress Concern Present (09/05/2023)   [employer] of Occupational Health - Occupational Stress Questionnaire    Feeling of Stress: Only a little  Social Connections: Socially Isolated (09/05/2023)   Social Connection and Isolation Panel    Frequency of Communication with Friends and Family: Once a week    Frequency of Social Gatherings with Friends and Family:  Never    Attends Religious Services: Never    Active Member of Clubs or Organizations: No    Attends Banker Meetings: Never    Marital Status: Divorced     Family History: The patient's family history includes Cancer (age of onset: 84) in her mother; Dementia in her father; Depression in her brother; Drug abuse in her brother; Parkinsonism in her father.  ROS:   Please see the history of present illness.     All other systems reviewed and are negative.  EKGs/Labs/Other Studies Reviewed:    The following studies were reviewed today:   EKG:   12/11/2023: Sinus rhythm with frequent PVCs, rate 85, poor R wave progression  Recent Labs: 10/04/2023: ALT 14; BUN 15; Creatinine, Ser 0.74; Potassium 4.0; Sodium 137 10/30/2023: Hemoglobin 11.6; Platelets 272; TSH 2.290  Recent Lipid Panel    Component Value Date/Time   CHOL 192 09/18/2023 1213   TRIG 170 (H) 09/18/2023 1213   HDL 47 09/18/2023 1213   CHOLHDL 4.1 09/18/2023 1213   CHOLHDL 3.7 02/25/2014 1028   VLDL 36 02/25/2014 1028   LDLCALC 115 (H) 09/18/2023 1213    Physical Exam:    VS:  BP 128/88   Pulse (!) 44   Ht 5' 1 (1.549 m)   Wt 165 lb 12.8 oz (75.2 kg)   SpO2 95%   BMI 31.33 kg/m     Wt Readings from Last 3 Encounters:  12/11/23 165 lb 12.8 oz (75.2 kg)  10/30/23 163 lb (73.9 kg)  10/04/23 158 lb 9.6 oz (71.9 kg)     GEN:  Well nourished, well developed in no acute distress HEENT: Normal NECK: No JVD; No  carotid bruits LYMPHATICS: No lymphadenopathy CARDIAC: RRR, no murmurs, rubs, gallops RESPIRATORY:  Clear to auscultation without rales, wheezing or rhonchi  ABDOMEN: Soft, non-tender, non-distended MUSCULOSKELETAL:  No edema; No deformity  SKIN: Warm and dry NEUROLOGIC:  Alert and oriented x 3 PSYCHIATRIC:  Normal affect   ASSESSMENT:    1. Precordial pain   2. Frequent PVCs   3. Follow-up exam   4. Essential hypertension   5. Hyperlipidemia, unspecified hyperlipidemia type    PLAN:    Chest pain: Atypical in description but does have multiple CAD risk factors (age, hypertension, hyperlipidemia, family history).  Recommend coronary CTA to evaluate for obstructive CAD.  Will give Lopressor 50 mg prior to study  Frequent PVCs: Noted on ECG in clinic today.  Will quantify with Zio patch x 3 days  Hyperlipidemia: On rosuvastatin  20 mg daily.  LDL 115 09/18/23, started statin at that time.  Will repeat lipid panel  Hypertension: on amlodipine  2.5 mg daily.  Appears controlled  RTC in 3 months  Medication Adjustments/Labs and Tests Ordered: Current medicines are reviewed at length with the patient today.  Concerns regarding medicines are outlined above.  Orders Placed This Encounter  Procedures   CT CORONARY MORPH W/CTA COR W/SCORE W/CA W/CM &/OR WO/CM   Basic Metabolic Panel (BMET)   Lipid Profile   LONG TERM MONITOR (3-14 DAYS)   EKG 12-Lead   Meds ordered this encounter  Medications   metoprolol tartrate (LOPRESSOR) 50 MG tablet    Sig: Take 1 tablet (50 mg total) by mouth once for 1 dose. Take 90-120 minutes prior to scan. Hold for SBP less than 110.    Dispense:  1 tablet    Refill:  0    Patient Instructions  Medication Instructions:   Your physician recommends  that you continue on your current medications as directed. Please refer to the Current Medication list given to you today.   *If you need a refill on your cardiac medications before your next appointment,  please call your pharmacy*  Lab Work:  TODAY!!!! BMET/LIPID  If you have labs (blood work) drawn today and your tests are completely normal, you will receive your results only by: MyChart Message (if you have MyChart) OR A paper copy in the mail If you have any lab test that is abnormal or we need to change your treatment, we will call you to review the results.  Testing/Procedures:    Your cardiac CT will be scheduled at one of the below locations:   [facility] [address]  [place], [place] [address]   If scheduled at the [facility] at [address], please enter the parking lot using the [address] entrance and use the FREE valet service at the patient drop-off area. Enter the building and check-in with registration on the main floor.  Please follow these instructions carefully (unless otherwise directed):  An IV will be required for this test and Nitroglycerin will be given.   On the Night Before the Test: Be sure to Drink plenty of water. Do not consume any caffeinated/decaffeinated beverages or chocolate 12 hours prior to your test. Do not take any antihistamines 12 hours prior to your test.  On the Day of the Test: Drink plenty of water until 1 hour prior to the test. Do not eat any food 1 hour prior to test. You may take your regular medications prior to the test.  Take metoprolol (Lopressor) ONE (1) tablet by mouth ( 50 mg) two hours prior to test. Patients who wear a continuous glucose monitor MUST remove the device prior to scanning. FEMALES- please wear underwire-free bra if available, avoid dresses & tight clothing      After the Test: Drink plenty of water. After receiving IV contrast, you may experience a mild flushed feeling. This is normal. On occasion, you may experience a mild rash up to 24 hours after the test. This is not dangerous. If this occurs, you can take Benadryl 25 mg, Zyrtec, Claritin, or  Allegra and increase your fluid intake. (Patients taking Tikosyn should avoid Benadryl, and may take Zyrtec, Claritin, or Allegra) If you experience trouble breathing, this can be serious. If it is severe call 911 IMMEDIATELY. If it is mild, please call our office.  We will call to schedule your test 2-4 weeks out understanding that some insurance companies will need an authorization prior to the service being performed.   For more information and frequently asked questions, please visit our website : [email or web address]  For non-scheduling related questions, please contact the cardiac imaging nurse navigator should you have any questions/concerns: Cardiac Imaging Nurse Navigators Direct Office Dial: [phone]   For scheduling needs, including cancellations and rescheduling, please call Brittany, [phone].   ZIO XT- Long Term Monitor Instructions  Your physician has requested you wear a ZIO patch monitor for 3  days.  This is a single patch monitor. [employer] supplies one patch monitor per enrollment. Additional stickers are not available. Please do not apply patch if you will be having a Nuclear Stress Test,  Echocardiogram, Cardiac CT, MRI, or Chest Xray during the period you would be wearing the  monitor. The patch cannot be worn during these tests. You cannot remove and re-apply the  ZIO XT patch monitor.  Your ZIO patch monitor will be mailed 3 day USPS to your address on file. It may take 3-5 days  to receive your monitor after you have been enrolled.  Once you have received your monitor, please review the enclosed instructions. Your monitor  has already been registered assigning a specific monitor serial # to you.  Billing and Patient Assistance Program Information  We have supplied [employer] with any of your insurance information on file for billing purposes. [employer] offers a sliding scale Patient Assistance Program for patients that do not have  insurance, or  whose insurance does not completely cover the cost of the ZIO monitor.  You must apply for the Patient Assistance Program to qualify for this discounted rate.  To apply, please call [employer] at [phone], select option 4, select option 2, ask to apply for  Patient Assistance Program. [facility] will ask your household income, and how many people  are in your household. They will quote your out-of-pocket cost based on that information.  [employer] will also be able to set up a 98-month, interest-free payment plan if needed.  Applying the monitor   Shave hair from upper left chest.  Hold abrader disc by orange tab. Rub abrader in 40 strokes over the upper left chest as  indicated in your monitor instructions.  Clean area with 4 enclosed alcohol pads. Let dry.  Apply patch as indicated in monitor instructions. Patch will be placed under collarbone on left  side of chest with arrow pointing upward.  Rub patch adhesive wings for 2 minutes. Remove white label marked 1. Remove the white  label marked 2. Rub patch adhesive wings for 2 additional minutes.  While looking in a mirror, press and release button in center of patch. A small green light will  flash 3-4 times. This will be your only indicator that the monitor has been turned on.  Do not shower for the first 24 hours. You may shower after the first 24 hours.  Press the button if you feel a symptom. You will hear a small click. Record Date, Time and  Symptom in the Patient Logbook.  When you are ready to remove the patch, follow instructions on the last 2 pages of Patient  Logbook. Stick patch monitor onto the last page of Patient Logbook.  Place Patient Logbook in the blue and white box. Use locking tab on box and tape box closed  securely. The blue and white box has prepaid postage on it. Please place it in the mailbox as  soon as possible. Your physician should have your test results approximately 7 days after the  monitor has been mailed  back to [facility].  Call [facility] Customer Care at [phone] if you have questions regarding  your ZIO XT patch monitor. Call them immediately if you see an orange light blinking on your  monitor.  If your monitor falls off in less than 4 days, contact our Monitor department at [phone].  If your monitor becomes loose or falls off after 4 days call [employer] at [phone] for  suggestions on securing your monitor    Follow-Up: At [facility], you and your health needs are our priority.  As part of our continuing mission to provide you with exceptional heart care, our providers are all part of one team.  This team includes your primary Cardiologist (physician) and Advanced Practice Providers or APPs (Physician Assistants and Nurse Practitioners) who all work together to provide you with the care you need, when  you need it.  Your next appointment:   4 month(s)  Provider:   Lonni Nanas, MD    We recommend signing up for the patient portal called MyChart.  Sign up information is provided on this After Visit Summary.  MyChart is used to connect with patients for Virtual Visits (Telemedicine).  Patients are able to view lab/test results, encounter notes, upcoming appointments, etc.  Non-urgent messages can be sent to your provider as well.   To learn more about what you can do with MyChart, go to [email or web address].              Signed, Lonni LITTIE Nanas, MD  12/11/2023 3:09 PM    [facility]

## 2023-12-11 ENCOUNTER — Ambulatory Visit (INDEPENDENT_AMBULATORY_CARE_PROVIDER_SITE_OTHER): Admitting: Cardiology

## 2023-12-11 ENCOUNTER — Encounter (HOSPITAL_BASED_OUTPATIENT_CLINIC_OR_DEPARTMENT_OTHER): Payer: Self-pay | Admitting: Cardiology

## 2023-12-11 ENCOUNTER — Ambulatory Visit: Attending: Cardiology

## 2023-12-11 VITALS — BP 128/88 | HR 44 | Ht 61.0 in | Wt 165.8 lb

## 2023-12-11 DIAGNOSIS — Z09 Encounter for follow-up examination after completed treatment for conditions other than malignant neoplasm: Secondary | ICD-10-CM

## 2023-12-11 DIAGNOSIS — R072 Precordial pain: Secondary | ICD-10-CM

## 2023-12-11 DIAGNOSIS — I10 Essential (primary) hypertension: Secondary | ICD-10-CM

## 2023-12-11 DIAGNOSIS — E785 Hyperlipidemia, unspecified: Secondary | ICD-10-CM

## 2023-12-11 DIAGNOSIS — I493 Ventricular premature depolarization: Secondary | ICD-10-CM | POA: Diagnosis not present

## 2023-12-11 LAB — BASIC METABOLIC PANEL WITH GFR
BUN/Creatinine Ratio: 13 (ref 12–28)
BUN: 9 mg/dL (ref 8–27)
CO2: 24 mmol/L (ref 20–29)
Calcium: 10.5 mg/dL — ABNORMAL HIGH (ref 8.7–10.3)
Chloride: 99 mmol/L (ref 96–106)
Creatinine, Ser: 0.72 mg/dL (ref 0.57–1.00)
Glucose: 102 mg/dL — ABNORMAL HIGH (ref 70–99)
Potassium: 4.4 mmol/L (ref 3.5–5.2)
Sodium: 136 mmol/L (ref 134–144)
eGFR: 87 mL/min/1.73 (ref >59)

## 2023-12-11 LAB — LIPID PANEL
Chol/HDL Ratio: 2.2 ratio (ref 0.0–4.4)
Cholesterol, Total: 142 mg/dL (ref 100–199)
HDL: 66 mg/dL (ref >39)
LDL Chol Calc (NIH): 53 mg/dL (ref 0–99)
Triglycerides: 138 mg/dL (ref 0–149)
VLDL Cholesterol Cal: 23 mg/dL (ref 5–40)

## 2023-12-11 MED ORDER — METOPROLOL TARTRATE 50 MG PO TABS: 50.0000 mg | ORAL_TABLET | Freq: Once | ORAL | 0 refills | Status: DC

## 2023-12-11 NOTE — Patient Instructions (Signed)
 Medication Instructions:   Your physician recommends that you continue on your current medications as directed. Please refer to the Current Medication list given to you today.   *If you need a refill on your cardiac medications before your next appointment, please call your pharmacy*  Lab Work:  TODAY!!!! BMET/LIPID  If you have labs (blood work) drawn today and your tests are completely normal, you will receive your results only by: MyChart Message (if you have MyChart) OR A paper copy in the mail If you have any lab test that is abnormal or we need to change your treatment, we will call you to review the results.  Testing/Procedures:    Your cardiac CT will be scheduled at one of the below locations:   [facility] [address]  [place], [place] [address]   If scheduled at the [facility] and V[facility] at [address], please enter the parking lot using the [address] entrance and use the FREE valet service at the patient drop-off area. Enter the building and check-in with registration on the main floor.  Please follow these instructions carefully (unless otherwise directed):  An IV will be required for this test and Nitroglycerin will be given.   On the Night Before the Test: Be sure to Drink plenty of water. Do not consume any caffeinated/decaffeinated beverages or chocolate 12 hours prior to your test. Do not take any antihistamines 12 hours prior to your test.  On the Day of the Test: Drink plenty of water until 1 hour prior to the test. Do not eat any food 1 hour prior to test. You may take your regular medications prior to the test.  Take metoprolol (Lopressor) ONE (1) tablet by mouth ( 50 mg) two hours prior to test. Patients who wear a continuous glucose monitor MUST remove the device prior to scanning. FEMALES- please wear underwire-free bra if available, avoid dresses & tight clothing      After the Test: Drink plenty of  water. After receiving IV contrast, you may experience a mild flushed feeling. This is normal. On occasion, you may experience a mild rash up to 24 hours after the test. This is not dangerous. If this occurs, you can take Benadryl 25 mg, Zyrtec, Claritin, or Allegra and increase your fluid intake. (Patients taking Tikosyn should avoid Benadryl, and may take Zyrtec, Claritin, or Allegra) If you experience trouble breathing, this can be serious. If it is severe call 911 IMMEDIATELY. If it is mild, please call our office.  We will call to schedule your test 2-4 weeks out understanding that some insurance companies will need an authorization prior to the service being performed.   For more information and frequently asked questions, please visit our website : [email or web address]  For non-scheduling related questions, please contact the cardiac imaging nurse navigator should you have any questions/concerns: Cardiac Imaging Nurse Navigators Direct Office Dial: [phone]   For scheduling needs, including cancellations and rescheduling, please call Brittany, [phone].   ZIO XT- Long Term Monitor Instructions  Your physician has requested you wear a ZIO patch monitor for 3  days.  This is a single patch monitor. [employer] supplies one patch monitor per enrollment. Additional stickers are not available. Please do not apply patch if you will be having a Nuclear Stress Test,  Echocardiogram, Cardiac CT, MRI, or Chest Xray during the period you would be wearing the  monitor. The patch cannot be worn during these tests. You cannot remove and re-apply  the  ZIO XT patch monitor.  Your ZIO patch monitor will be mailed 3 day USPS to your address on file. It may take 3-5 days  to receive your monitor after you have been enrolled.  Once you have received your monitor, please review the enclosed instructions. Your monitor  has already been registered assigning a specific monitor serial # to  you.  Billing and Patient Assistance Program Information  We have supplied [employer] with any of your insurance information on file for billing purposes. [employer] offers a sliding scale Patient Assistance Program for patients that do not have  insurance, or whose insurance does not completely cover the cost of the ZIO monitor.  You must apply for the Patient Assistance Program to qualify for this discounted rate.  To apply, please call [employer] at [phone], select option 4, select option 2, ask to apply for  Patient Assistance Program. M[place]eth will ask your household income, and how many people  are in your household. They will quote your out-of-pocket cost based on that information.  [employer] will also be able to set up a 11-month, interest-free payment plan if needed.  Applying the monitor   Shave hair from upper left chest.  Hold abrader disc by orange tab. Rub abrader in 40 strokes over the upper left chest as  indicated in your monitor instructions.  Clean area with 4 enclosed alcohol pads. Let dry.  Apply patch as indicated in monitor instructions. Patch will be placed under collarbone on left  side of chest with arrow pointing upward.  Rub patch adhesive wings for 2 minutes. Remove white label marked 1. Remove the white  label marked 2. Rub patch adhesive wings for 2 additional minutes.  While looking in a mirror, press and release button in center of patch. A small green light will  flash 3-4 times. This will be your only indicator that the monitor has been turned on.  Do not shower for the first 24 hours. You may shower after the first 24 hours.  Press the button if you feel a symptom. You will hear a small click. Record Date, Time and  Symptom in the Patient Logbook.  When you are ready to remove the patch, follow instructions on the last 2 pages of Patient  Logbook. Stick patch monitor onto the last page of Patient Logbook.  Place Patient Logbook in the blue and white box.  Use locking tab on box and tape box closed  securely. The blue and white box has prepaid postage on it. Please place it in the mailbox as  soon as possible. Your physician should have your test results approximately 7 days after the  monitor has been mailed back to [place].  Call [facility] Customer Care at [phone] if you have questions regarding  your ZIO XT patch monitor. Call them immediately if you see an orange light blinking on your  monitor.  If your monitor falls off in less than 4 days, contact our Monitor department at [phone].  If your monitor becomes loose or falls off after 4 days call [employer] at [phone] for  suggestions on securing your monitor    Follow-Up: At [facility], you and your health needs are our priority.  As part of our continuing mission to provide you with exceptional heart care, our providers are all part of one team.  This team includes your primary Cardiologist (physician) and Advanced Practice Providers or APPs (Physician Assistants and Nurse Practitioners) who all work together to provide  you with the care you need, when you need it.  Your next appointment:   4 month(s)  Provider:   Lonni Nanas, MD    We recommend signing up for the patient portal called MyChart.  Sign up information is provided on this After Visit Summary.  MyChart is used to connect with patients for Virtual Visits (Telemedicine).  Patients are able to view lab/test results, encounter notes, upcoming appointments, etc.  Non-urgent messages can be sent to your provider as well.   To learn more about what you can do with MyChart, go to [email or web address].

## 2023-12-11 NOTE — Progress Notes (Unsigned)
 Enrolled for [employer] to mail a ZIO XT long term holter monitor to the patients address on file.

## 2023-12-13 ENCOUNTER — Ambulatory Visit: Payer: Self-pay | Admitting: Cardiology

## 2023-12-13 DIAGNOSIS — K839 Disease of biliary tract, unspecified: Secondary | ICD-10-CM

## 2023-12-17 ENCOUNTER — Telehealth (HOSPITAL_COMMUNITY): Payer: Self-pay | Admitting: *Deleted

## 2023-12-17 NOTE — Telephone Encounter (Signed)

## 2023-12-18 ENCOUNTER — Ambulatory Visit (HOSPITAL_COMMUNITY)
Admission: RE | Admit: 2023-12-18 | Discharge: 2023-12-18 | Disposition: A | Discharge status: Home / Self Care | Source: Ambulatory Visit | Attending: Cardiology | Admitting: Cardiology

## 2023-12-18 ENCOUNTER — Ambulatory Visit: Admitting: Internal Medicine

## 2023-12-18 ENCOUNTER — Encounter: Payer: Self-pay | Admitting: Internal Medicine

## 2023-12-18 VITALS — BP 129/79 | HR 67 | Temp 97.6°F | Ht 61.0 in | Wt 165.0 lb

## 2023-12-18 DIAGNOSIS — R072 Precordial pain: Secondary | ICD-10-CM | POA: Insufficient documentation

## 2023-12-18 DIAGNOSIS — F332 Major depressive disorder, recurrent severe without psychotic features: Secondary | ICD-10-CM

## 2023-12-18 DIAGNOSIS — Z09 Encounter for follow-up examination after completed treatment for conditions other than malignant neoplasm: Secondary | ICD-10-CM | POA: Diagnosis not present

## 2023-12-18 DIAGNOSIS — Z79899 Other long term (current) drug therapy: Secondary | ICD-10-CM

## 2023-12-18 DIAGNOSIS — I10 Essential (primary) hypertension: Secondary | ICD-10-CM

## 2023-12-18 DIAGNOSIS — F411 Generalized anxiety disorder: Secondary | ICD-10-CM

## 2023-12-18 DIAGNOSIS — F330 Major depressive disorder, recurrent, mild: Secondary | ICD-10-CM

## 2023-12-18 DIAGNOSIS — M25561 Pain in right knee: Secondary | ICD-10-CM | POA: Diagnosis not present

## 2023-12-18 DIAGNOSIS — I493 Ventricular premature depolarization: Secondary | ICD-10-CM | POA: Diagnosis present

## 2023-12-18 DIAGNOSIS — G8929 Other chronic pain: Secondary | ICD-10-CM | POA: Diagnosis not present

## 2023-12-18 MED ORDER — NITROGLYCERIN 0.4 MG SL SUBL
0.8000 mg | SUBLINGUAL_TABLET | Freq: Once | SUBLINGUAL | Status: AC
  Administered 2023-12-18: 0.8 mg via SUBLINGUAL

## 2023-12-18 MED ORDER — DULOXETINE HCL 30 MG PO CPEP
ORAL_CAPSULE | ORAL | Status: AC
Start: 2023-12-18 — End: ?

## 2023-12-18 MED ORDER — ALPRAZOLAM 0.5 MG PO TABS
ORAL_TABLET | ORAL | 0 refills | Status: AC
Start: 2023-12-18 — End: ?

## 2023-12-18 MED ORDER — IOHEXOL 350 MG/ML SOLN
100.0000 mL | Freq: Once | INTRAVENOUS | Status: AC | PRN
  Administered 2023-12-18: 100 mL via INTRAVENOUS

## 2023-12-18 NOTE — Patient Instructions (Addendum)
 It was wonderful to see you today!  Please call Dr. Lyndle office (orthopedic surgery) to discuss scheduling your knee surgery. If they have medical clearance forms, please send one to the cardiology office and [facility]. -Continue Advil as needed, add in Tylenol  1000 mg every 8 hours  Please call the endocrinology office below to schedule an appointment to discuss high calcium  levels: Megan Lipps, MD Endocrinologist in [place], [place]  Address: [address], [place], [place] [address] Phone: [phone]

## 2023-12-18 NOTE — Assessment & Plan Note (Addendum)
 Chronic and stable. Awaiting cardiology evaluation prior to TKA surgery scheduling. Discussed calling Dr. Lyndle office to get scheduling process started today, clearance forms to be sent as needed. We reviewed Cymbalta  and appropriate ibuprofen use. Addition of Tylenol  which she was open to today. Referred to PM&R at prior visit for consideration of nerve ablation if orthopedic surgery turns out not to be an option.  Plan -Continue f/u with orthopedics for surgery discussion pending cardiac evaluation -Continue duloxetine  60 mg daily, appropriate ibuprofen dosing + acetaminophen  1000 mg tid

## 2023-12-18 NOTE — Assessment & Plan Note (Signed)
 Remains on amlodipine  2.5 mg daily. Repeat BP at goal today. No changes.

## 2023-12-18 NOTE — Progress Notes (Signed)
 Established Patient Office Visit  Subjective   Patient ID: [name], female    DOB: [birth year]  Age: 73 y.o. MRN: [id number]  Chief Complaint  Patient presents with   Discuss medication    Take 4 Advils at one time. Refill Xanax . Knee pain.    [name] returns to clinic today to discuss chronic right knee pain, anxiety, cardiac w/u, and hypercalcemia. Please see assessment/plan in problem-based charting for further details of today's visit.    Patient Active Problem List   Diagnosis Date Noted   Irregular heart rhythm 09/18/2023   Screening for colon cancer 04/17/2023   Cholelithiases 04/17/2023   Hypercalcemia 06/06/2022   Spinal stenosis 02/24/2022   Osteopenia 11/18/2021   Vitamin D  deficiency 04/18/2019   Substernal chest pain 04/18/2019   Obesity (BMI 30-39.9) 03/09/2017   Knee pain, right 07/01/2014   GERD (gastroesophageal reflux disease) 05/28/2014   Pre-diabetes 12/17/2013   Generalized anxiety disorder 02/19/2013   Moderately severe recurrent major depression (HCC) 04/22/2012   Hyperlipidemia 02/20/2008   HYPERTENSION, BENIGN ESSENTIAL 10/23/2007   Insomnia 03/28/2006     Objective:     BP 129/79 (BP Location: Left Arm, Patient Position: Sitting, Cuff Size: Normal)   Pulse 67   Temp 97.6 F (36.4 C) (Oral)   Ht 5' 1 (1.549 m)   Wt 165 lb (74.8 kg)   SpO2 97% Comment: RA  BMI 31.18 kg/m  BP Readings from Last 3 Encounters:  12/18/23 129/79  12/11/23 128/88  10/30/23 134/77   Wt Readings from Last 3 Encounters:  12/18/23 165 lb (74.8 kg)  12/11/23 165 lb 12.8 oz (75.2 kg)  10/30/23 163 lb (73.9 kg)    Physical Exam Constitutional:      General: She is not in acute distress.    Appearance: Normal appearance. She is not ill-appearing.  Neurological:     Mental Status: She is alert. Mental status is at baseline.  Psychiatric:        Mood and Affect: Mood normal.        Behavior: Behavior normal.      Assessment & Plan:    Problem List Items Addressed This Visit       Cardiovascular and Mediastinum   HYPERTENSION, BENIGN ESSENTIAL - Primary (Chronic)   Remains on amlodipine  2.5 mg daily. Repeat BP at goal today. No changes.        Other   Knee pain, right (Chronic)   Chronic and stable. Awaiting cardiology evaluation prior to TKA surgery scheduling. Discussed calling Dr. Lyndle office to get scheduling process started today, clearance forms to be sent as needed. We reviewed Cymbalta  and appropriate ibuprofen use. Addition of Tylenol  which she was open to today. Referred to PM&R at prior visit for consideration of nerve ablation if orthopedic surgery turns out not to be an option.  Plan -Continue f/u with orthopedics for surgery discussion pending cardiac evaluation -Continue duloxetine  60 mg daily, appropriate ibuprofen dosing + acetaminophen  1000 mg tid      Moderately severe recurrent major depression (HCC)   More full affect today. Hopeful that knee surgery will allow her to be more active and get another dog. Reviewed medication list, now on duloxetine  and buspirone  bid. Remove Lexapro  from home supply.      Relevant Medications   ALPRAZolam  (XANAX ) 0.5 MG tablet   DULoxetine  (CYMBALTA ) 30 MG capsule   Generalized anxiety disorder   Chronic. We discussed alprazolam  use at initial visit with me in March 2025.  [name] noted taking 1-2 tablets/day for anxiety. Fill amount decreased from 120 to 60 tablets/month at that time to which she was agreeable, new controlled substance agreement signed. She notes worsened anxiety over the past months due to death of her dog, as well as ongoing evaluation for health conditions, including cardiac w/u. We have switched to duloxetine  (for chronic pain benefit) and added buspirone . No interest in counseling. We reviewed the risks of chronic benzodiazepine use and my recommendation to eventually taper off completely, however will not initiate this at this time.  Will continue current prescription and continue to address at future visits.      Relevant Medications   ALPRAZolam  (XANAX ) 0.5 MG tablet   DULoxetine  (CYMBALTA ) 30 MG capsule   Substernal chest pain   Saw cardiology for atypical chest pain last week. Plan for coronary CTA today for further assessment. Also ordered Zio patch for 3 days d/t frequent PVCs. Pending results for surgical risk evaluation.      Hypercalcemia   Reviewed elevated calcium  levels with unclear etiology on w/u. Referral placed to endocrinology and information provided for scheduling today.      Other Visit Diagnoses       Major depressive disorder, recurrent episode, mild       Relevant Medications   ALPRAZolam  (XANAX ) 0.5 MG tablet   DULoxetine  (CYMBALTA ) 30 MG capsule       Return in about 3 months (around 03/19/2024).    Megan Sergeant, MD

## 2023-12-18 NOTE — Assessment & Plan Note (Addendum)
 Saw cardiology for atypical chest pain last week. Plan for coronary CTA today for further assessment. Also ordered Zio patch for 3 days d/t frequent PVCs. Pending results for surgical risk evaluation.

## 2023-12-18 NOTE — Assessment & Plan Note (Signed)
 Reviewed elevated calcium  levels with unclear etiology on w/u. Referral placed to endocrinology and information provided for scheduling today.

## 2023-12-18 NOTE — Assessment & Plan Note (Signed)
 Chronic. We discussed alprazolam  use at initial visit with me in March 2025. Ms. Frankowski noted taking 1-2 tablets/day for anxiety. Fill amount decreased from 120 to 60 tablets/month at that time to which she was agreeable, new controlled substance agreement signed. She notes worsened anxiety over the past months due to death of her dog, as well as ongoing evaluation for health conditions, including cardiac w/u. We have switched to duloxetine  (for chronic pain benefit) and added buspirone . No interest in counseling. We reviewed the risks of chronic benzodiazepine use and my recommendation to eventually taper off completely, however will not initiate this at this time. Will continue current prescription and continue to address at future visits.

## 2023-12-18 NOTE — Assessment & Plan Note (Signed)
 More full affect today. Hopeful that knee surgery will allow her to be more active and get another dog. Reviewed medication list, now on duloxetine  and buspirone  bid. Remove Lexapro  from home supply.

## 2023-12-25 ENCOUNTER — Telehealth: Payer: Self-pay | Admitting: *Deleted

## 2023-12-25 NOTE — Telephone Encounter (Signed)
 Copied from CRM [id number]. Topic: Clinical - Medication Question >> Dec 25, 2023  2:50 PM Suzette B wrote: Reason for CRM: [id number] patient is calling to discuss medication rosuvastatin  (CRESTOR ) 20 MG tablet, she is confused about the medication and wanted to know what it was for she was confused please call to advise.

## 2023-12-25 NOTE — Telephone Encounter (Signed)
 Return pt's call - explained to pt Crestor  is for high cholesterol.  She stated her cardiologist  told her her cholesterol level looks good (done 11/180; she wants to know if she should continue taking the medication. I told her I will ask Dr Karna.

## 2023-12-26 NOTE — Telephone Encounter (Signed)
 Pt was called and informed to continue taking Crestor  per Dr Karna.  Then pt stated she need to talk to someone; stated she did something; she would not go into details. [name] is not available. I gave her [facility] [phone]), she stated they won't talk to me, I told her that's why they are there. She told me she will call.

## 2023-12-26 NOTE — Telephone Encounter (Addendum)
 Returned patient's call to check in, no answer and no voicemail available.   Addendum 12/3 ~1400 Able to reach Ms. Shiflett this afternoon. She reports that she gave some money to a scam over the phone yesterday and is now worried about her rent. I offered referral to SW for any assistance, she declined.  Affect full over the phone. She tells me she has no intention to harm herself at this time but continues to note feelings of depression in relation to her current social situation which is very difficult. She continues to decline talking with SW or a counselor, does not feel like she needs to talk to anyone else today. Previously declined psychiatry referral. I have encouraged her to call our office, [facility], or [phone] if she has active self-harm thoughts.

## 2023-12-28 ENCOUNTER — Other Ambulatory Visit: Payer: Self-pay | Admitting: Internal Medicine

## 2023-12-28 DIAGNOSIS — I10 Essential (primary) hypertension: Secondary | ICD-10-CM

## 2023-12-31 ENCOUNTER — Other Ambulatory Visit (HOSPITAL_COMMUNITY)

## 2024-01-01 ENCOUNTER — Ambulatory Visit (HOSPITAL_COMMUNITY): Admission: RE | Admit: 2024-01-01 | Discharge: 2024-01-01 | Attending: Cardiology

## 2024-01-01 DIAGNOSIS — K839 Disease of biliary tract, unspecified: Secondary | ICD-10-CM

## 2024-01-01 MED ORDER — IOHEXOL 350 MG/ML SOLN
75.0000 mL | Freq: Once | INTRAVENOUS | Status: AC | PRN
  Administered 2024-01-01: 75 mL via INTRAVENOUS

## 2024-01-04 ENCOUNTER — Other Ambulatory Visit: Payer: Self-pay | Admitting: Internal Medicine

## 2024-01-04 DIAGNOSIS — K219 Gastro-esophageal reflux disease without esophagitis: Secondary | ICD-10-CM

## 2024-01-04 NOTE — Telephone Encounter (Signed)
 Medication sent to pharmacy

## 2024-01-06 ENCOUNTER — Ambulatory Visit: Payer: Self-pay | Admitting: Cardiology

## 2024-01-06 DIAGNOSIS — K839 Disease of biliary tract, unspecified: Secondary | ICD-10-CM

## 2024-01-07 ENCOUNTER — Other Ambulatory Visit: Payer: Self-pay | Admitting: Internal Medicine

## 2024-01-07 DIAGNOSIS — G8929 Other chronic pain: Secondary | ICD-10-CM

## 2024-01-07 DIAGNOSIS — M4805 Spinal stenosis, thoracolumbar region: Secondary | ICD-10-CM

## 2024-01-11 ENCOUNTER — Telehealth (HOSPITAL_BASED_OUTPATIENT_CLINIC_OR_DEPARTMENT_OTHER): Payer: Self-pay | Admitting: *Deleted

## 2024-01-11 DIAGNOSIS — Z01818 Encounter for other preprocedural examination: Secondary | ICD-10-CM

## 2024-01-11 NOTE — Telephone Encounter (Signed)
"  ° °  Pre-operative Risk Assessment    Patient Name: [name]  DOB: 1951-01-16 MRN: [id number]   Date of last office visit: 12/11/23 DR. CHRISTOPHER SCHUMANN Date of next office visit: 03/26/24 DR. [facility]   Request for Surgical Clearance    Procedure:  RIGHT TOTAL KNEE ARTHROPLASTY  Date of Surgery:  Clearance 04/29/24                                Surgeon:  DR. MATTHEW OLIN Surgeon's Group or Practice Name:  [facility] Phone number:  [phone] [facility] Fax number:  [phone]    Type of Clearance Requested:   - Medical    Type of Anesthesia:  Spinal   Additional requests/questions:    Bonney Niels Jest   01/11/2024, 1:37 PM   "

## 2024-01-14 NOTE — Telephone Encounter (Signed)
 No further workup recommended prior to knee surgery

## 2024-01-15 ENCOUNTER — Encounter (HOSPITAL_BASED_OUTPATIENT_CLINIC_OR_DEPARTMENT_OTHER): Payer: Self-pay

## 2024-01-15 NOTE — Addendum Note (Signed)
 Addended by: WYNETTA NIELS HERO on: 01/15/2024 10:53 AM   Modules accepted: Orders

## 2024-01-15 NOTE — Telephone Encounter (Signed)
 Hey Preop team, Dr. Kate is requesting an echo be performed on this patient prior to her surgery. Please order an echo under Dr. Kate. She already has an in-office appointment with him in March. I will update the appointment notes to reflect pre-op clearance and remove this from our box.

## 2024-01-15 NOTE — Telephone Encounter (Signed)
 Sure we can get an echo

## 2024-01-22 ENCOUNTER — Other Ambulatory Visit: Payer: Self-pay | Admitting: Internal Medicine

## 2024-01-22 DIAGNOSIS — F411 Generalized anxiety disorder: Secondary | ICD-10-CM

## 2024-01-24 ENCOUNTER — Other Ambulatory Visit: Payer: Self-pay | Admitting: Student

## 2024-01-25 ENCOUNTER — Encounter: Attending: Physical Medicine & Rehabilitation | Admitting: Physical Medicine & Rehabilitation

## 2024-01-25 ENCOUNTER — Encounter: Payer: Self-pay | Admitting: Physical Medicine & Rehabilitation

## 2024-01-25 VITALS — BP 107/69 | HR 95 | Ht 61.0 in | Wt 165.0 lb

## 2024-01-25 DIAGNOSIS — M25561 Pain in right knee: Secondary | ICD-10-CM | POA: Diagnosis not present

## 2024-01-25 DIAGNOSIS — G8929 Other chronic pain: Secondary | ICD-10-CM | POA: Diagnosis not present

## 2024-01-25 NOTE — Progress Notes (Addendum)
 "  Subjective:    Patient ID: [name], female    DOB: 01-Jan-1951, 74 y.o.   MRN: [id number]  HPI Discussed the use of AI scribe software for clinical note transcription with the patient, who gave verbal consent to proceed.  History of Present Illness [name] is a 74 year old female with chronic right knee osteoarthritis who presents for evaluation of worsening right knee pain.  She reports progressively worsening right knee pain over the past two years, described as constant and persistent both day and night. The pain is associated with episodes of popping and discomfort that disrupt sleep, limiting her to two hours of rest last night. Pain is exacerbated by standing and walking. No significant pain is present in the left knee.  She has received four prior intra-articular injections to the right knee, including both corticosteroid and hyaluronic acid formulations, without significant relief. The initial injection, described as a thick cream, was particularly painful and resulted in persistent discomfort. She has not undergone genicular nerve block injections and has not participated in physical therapy for her knee.  Current pain management includes frequent high-dose ibuprofen, 800 mg in the morning and afternoon, a regimen maintained for over two years. She acknowledges excessive use but finds it necessary for pain control. She has used topical diclofenac  inconsistently. Previous use of acetaminophen  with codeine  resulted in rash, pruritus, and headache, leading to discontinuation. She has not tried tramadol , ketorolac , or other prescription analgesics. No use of marijuana or alcohol for pain control.  She has gained approximately 20 pounds over the past six months, which she attributes to inactivity following the loss of her dog. She expresses frustration with weight gain and decreased activity.  She has mild osteoarthritis in her hands and chronic back pain,  but right knee pain is her most significant concern at this time.   Pain Inventory Average Pain 8 Pain Right Now 8 My pain is sharp, stabbing, and aching  In the last 24 hours, has pain interfered with the following? General activity 1 Relation with others 3 Enjoyment of life 3 What TIME of day is your pain at its worst? morning , evening, and night Sleep (in general) Fair  Pain is worse with: walking, bending, inactivity, standing, and some activites Pain improves with: rest and medication Relief from Meds: 6  walk without assistance how many minutes can you walk? 5-10 ability to climb steps?  no do you drive?  yes  retired I need assistance with the following:  household duties and shopping  trouble walking spasms dizziness anxiety  New pt  New pt    Family History  Problem Relation Age of Onset   Cancer Mother 69       Lung   Parkinsonism Father    Dementia Father    Drug abuse Brother    Depression Brother    Social History   Socioeconomic History   Marital status: Single    Spouse name: Not on file   Number of children: Not on file   Years of education: Not on file   Highest education level: Not on file  Occupational History   Not on file  Tobacco Use   Smoking status: Never   Smokeless tobacco: Never  Vaping Use   Vaping status: Never Used  Substance and Sexual Activity   Alcohol use: Not Currently    Alcohol/week: 0.0 standard drinks of alcohol   Drug use: No   Sexual activity: Not Currently  Other Topics Concern   Not on file  Social History Narrative   Current Social History 05/27/2020        Patient lives alone in an apartment which is 1 story. There are not steps up to the entrance.       Patient's method of transportation is personal car.      The highest level of education was some college.      The patient currently retired.      Identified important Relationships are My dog       Pets : dog passed away       Interests /  Fun: being with my dog, I don't like people       Current Stressors: poor family relationships       Religious / Personal Beliefs: I believe in God      Social Drivers of Health   Tobacco Use: Low Risk (12/18/2023)   Patient History    Smoking Tobacco Use: Never    Smokeless Tobacco Use: Never    Passive Exposure: Not on file  Financial Resource Strain: Low Risk (09/05/2023)   Overall Financial Resource Strain (CARDIA)    Difficulty of Paying Living Expenses: Not hard at all  Food Insecurity: No Food Insecurity (09/05/2023)   Epic    Worried About Programme Researcher, Broadcasting/film/video in the Last Year: Never true    Ran Out of Food in the Last Year: Never true  Transportation Needs: No Transportation Needs (09/05/2023)   Epic    Lack of Transportation (Medical): No    Lack of Transportation (Non-Medical): No  Physical Activity: Inactive (09/05/2023)   Exercise Vital Sign    Days of Exercise per Week: 0 days    Minutes of Exercise per Session: 0 min  Stress: No Stress Concern Present (09/05/2023)   [employer] of Occupational Health - Occupational Stress Questionnaire    Feeling of Stress: Only a little  Social Connections: Socially Isolated (09/05/2023)   Social Connection and Isolation Panel    Frequency of Communication with Friends and Family: Once a week    Frequency of Social Gatherings with Friends and Family: Never    Attends Religious Services: Never    Database Administrator or Organizations: No    Attends Banker Meetings: Never    Marital Status: Divorced  Depression (PHQ2-9): High Risk (10/04/2023)   Depression (PHQ2-9)    PHQ-2 Score: 20  Alcohol Screen: Low Risk (09/05/2023)   Alcohol Screen    Last Alcohol Screening Score (AUDIT): 0  Housing: Low Risk (09/05/2023)   Epic    Unable to Pay for Housing in the Last Year: No    Number of Times Moved in the Last Year: 0    Homeless in the Last Year: No  Utilities: Not At Risk (09/05/2023)   Epic    Threatened  with loss of utilities: No  Health Literacy: Adequate Health Literacy (09/05/2023)   B1300 Health Literacy    Frequency of need for help with medical instructions: Never   Past Surgical History:  Procedure Laterality Date   BUNIONECTOMY     bilateral   FRONTAL SINUS EXPLORATION Left 09/15/2014   Procedure: LEFT ENDOSCOPIC FRONTAL RECESS  EXPLORATION WITH FUSION IMAGE GUIDANCE NAVIGATION;  Surgeon: Daniel Moccasin, MD;  Location: [facility];  Service: ENT;  Laterality: Left;   SEPTOPLASTY Bilateral 10/20/2013   Procedure: SEPTOPLASTY;  Surgeon: Ana LELON Moccasin, MD;  Location:  SURGERY CENTER;  Service:  ENT;  Laterality: Bilateral;   SINUS ENDO W/FUSION Bilateral 10/20/2013   Procedure: ENDOSCOPIC SINUS SURGERY WITH FUSION NAVIGATION, BILATERAL MAXILLARY ANTROSTOMIES, BILATERAL ETHMOIDECTOMIES, BILATERAL SPHENOIDECTOMIES, BILATERAL FRONTAL RECESS EXPLORATION;  Surgeon: Ana LELON Moccasin, MD;  Location: [facility];  Service: ENT;  Laterality: Bilateral;   SINUS ENDO WITH FUSION Left 09/15/2014   Procedure: LEFT ENDOSCOPIC TOTAL ETHMOIDECTOMY WITH FUSION IMAGE GUIDANCE NAVIGATION;  Surgeon: Daniel Moccasin, MD;  Location: [facility];  Service: ENT;  Laterality: Left;   SPHENOIDECTOMY Right 09/15/2014   Procedure: RIGHT ENDOSCOPIC SPHENOIDECTOMY WITH FUSION IMAGE GUIDANCE NAVIGATION;  Surgeon: Daniel Moccasin, MD;  Location: [facility];  Service: ENT;  Laterality: Right;   Past Medical History:  Diagnosis Date   Anxiety    Arthritis    Chronic sinusitis 04/30/2013   Depression    Diverticulosis 04/19/2011   Dx on colonoscopy in 05/2006     GERD (gastroesophageal reflux disease)    Hypercalcemia 06/06/2022   PONV (postoperative nausea and vomiting)    Routine adult health maintenance 11/06/2011   Colonoscopy: 2010 - diverticulosis, no polyps  DEXA - 2008, osteopenia in the hip.  Due to diet, would recommend repeat and Ca/Vit D supplementation  MMG - 04/2011,  negative  PAP - 05/2011, inadequate, needs discussion     Flu: 09/2012  Pneumovax at 74yo  Tetanus: needs updating  Zostavax: due, to discuss     Vegetarian diet 08/18/2011   BP 107/69   Pulse 95   Ht 5' 1 (1.549 m)   Wt 165 lb (74.8 kg)   SpO2 94%   BMI 31.18 kg/m   Opioid Risk Score:   Fall Risk Score:  `1  Depression screen [facility] 2/9     10/04/2023    2:11 PM 09/18/2023   12:09 PM 09/05/2023   10:40 AM 04/17/2023   10:21 AM 02/08/2023    2:12 PM 01/15/2023    3:06 PM 08/30/2022   10:56 AM  Depression screen PHQ 2/9  Decreased Interest 3 2 0 2 3 3  0  Down, Depressed, Hopeless 2 3 0 1 3 3 3   PHQ - 2 Score 5 5 0 3 6 6 3   Altered sleeping 2 2 3 3 1 3 1   Tired, decreased energy 3 2 3 2 2 3  0  Change in appetite 3 3 0 1 2 3  0  Feeling bad or failure about yourself  3 3 3 3 3 3 2   Trouble concentrating 2 2 0 2 2 2 2   Moving slowly or fidgety/restless 0 2 0 0 1 0 0  Suicidal thoughts 2 1 0 1 0 3 0  PHQ-9 Score 20  20  9  15  17  23  8    Difficult doing work/chores Very difficult Very difficult Not difficult at all Somewhat difficult Very difficult Very difficult      Data saved with a previous flowsheet row definition      Review of Systems  Musculoskeletal:  Positive for gait problem.       Right knee pain  Neurological:  Positive for dizziness.  Psychiatric/Behavioral:  The patient is nervous/anxious.   All other systems reviewed and are negative.      Objective:   Physical Exam Constitutional:      Appearance: She is obese.  HENT:     Head: Normocephalic and atraumatic.  Eyes:     Extraocular Movements: Extraocular movements intact.     Conjunctiva/sclera: Conjunctivae normal.  Pupils: Pupils are equal, round, and reactive to light.  Neurological:     Mental Status: She is alert.   Motor strength is 5/5 bilateral deltoid bicep tricep grip hip flexor knee extensor ankle dorsiflexor Negative straight leg raising bilaterally Ambulates with antalgic gait reduced  stance phase on the right side Varus deformity right knee Right knee range of motion is normal with flexion extension There is some pain with valgus stress of the right knee There is no evidence of knee effusion. There is no pain over the medial or lateral joint line there is mild pain at the superior pole of the patella but no pain at the patellar tendon.        Assessment & Plan:   Assessment and Plan Assessment & Plan Chronic right knee pain due to osteoarthritis Chronic right knee pain due to osteoarthritis, refractory to multiple treatments. Scheduled for total knee arthroplasty in April. Discussed risks of high-dose NSAID use and alternative pain management options. She elected buprenorphine  patch for preoperative pain control. - Discussed pain management options: oral medications, buprenorphine  patch, genicular nerve block injections. - Reviewed NSAID risks and confirmed current tolerance. - Explained genicular nerve block procedure. - Discussed buprenorphine  patch as non-narcotic option. - Ordered urine drug screen prior to buprenorphine  initiation. - Provided anticipatory guidance on postoperative pain management transition and expected improvement after arthroplasty. - Option to revisit genicular nerve block if buprenorphine  patch is inadequate.  Obesity Weight gain of 20 pounds over six months, likely due to decreased activity from knee pain. Obesity may exacerbate knee pain and reduce functional status. - Discussed relationship between decreased activity, weight gain, and knee pain.  UDS demonstrated alprazolam  as expected from the prescription.  Will start buprenorphine  patch 5 mcg change weekly.  This is not going to cause respiratory depression so safe to use with alprazolam .  May need to titrate dose upward. "

## 2024-01-25 NOTE — Patient Instructions (Signed)
" °  VISIT SUMMARY: Today, we discussed your worsening right knee pain due to osteoarthritis and your upcoming total knee replacement surgery. We also addressed your recent weight gain and its impact on your knee pain.  YOUR PLAN: CHRONIC RIGHT KNEE PAIN DUE TO OSTEOARTHRITIS: Your right knee pain has been getting worse and hasn't improved with previous treatments. You are scheduled for knee replacement surgery in April. -We talked about different pain management options, including oral medications, buprenorphine patch, and genicular nerve block injections. -You chose to try the buprenorphine patch for pain control before your surgery. -We reviewed the risks of using high-dose NSAIDs and confirmed that you are currently tolerating them. -I explained the genicular nerve block procedure as another option if the buprenorphine patch does not provide enough relief. -A urine drug screen was ordered before starting the buprenorphine patch. -We discussed what to expect for pain management after your surgery and how your pain should improve.  OBESITY: You have gained 20 pounds in the last six months, likely due to decreased activity from your knee pain. This weight gain can make your knee pain worse. -We talked about how decreased activity and weight gain are related to your knee pain.                      Contains text generated by Abridge.                                 Contains text generated by Abridge.   "

## 2024-01-28 ENCOUNTER — Encounter (HOSPITAL_COMMUNITY): Payer: Self-pay | Admitting: Cardiology

## 2024-01-28 ENCOUNTER — Telehealth: Payer: Self-pay

## 2024-01-28 LAB — DRUG TOX MONITOR 1 W/CONF, ORAL FLD
AMINOCLONAZEPAM: NEGATIVE ng/mL
Alprazolam: 1.73 ng/mL — ABNORMAL HIGH
Amphetamines: NEGATIVE ng/mL
Barbiturates: NEGATIVE ng/mL
Benzodiazepines: POSITIVE ng/mL — AB
Buprenorphine: NEGATIVE ng/mL
Chlordiazepoxide: NEGATIVE ng/mL
Clonazepam: NEGATIVE ng/mL
Cocaine: NEGATIVE ng/mL
Diazepam: NEGATIVE ng/mL
Fentanyl: NEGATIVE ng/mL
Flunitrazepam: NEGATIVE ng/mL
Flurazepam: NEGATIVE ng/mL
Heroin Metabolite: NEGATIVE ng/mL
Lorazepam: NEGATIVE ng/mL
MARIJUANA: NEGATIVE ng/mL
MDMA: NEGATIVE ng/mL
Meprobamate: NEGATIVE ng/mL
Methadone: NEGATIVE ng/mL
Midazolam: NEGATIVE ng/mL
Nicotine Metabolite: NEGATIVE ng/mL
Nordiazepam: NEGATIVE ng/mL
Opiates: NEGATIVE ng/mL
Oxazepam: NEGATIVE ng/mL
Phencyclidine: NEGATIVE ng/mL
Tapentadol: NEGATIVE ng/mL
Temazepam: NEGATIVE ng/mL
Tramadol: NEGATIVE ng/mL
Triazolam: NEGATIVE ng/mL
Zolpidem: NEGATIVE ng/mL

## 2024-01-28 NOTE — Telephone Encounter (Signed)
 Patient called asking for pain medication. I called and explained to patient that Dr. Carilyn will not prescribe anything until the drug screen comes back.

## 2024-01-29 ENCOUNTER — Ambulatory Visit: Payer: Self-pay

## 2024-01-29 NOTE — Telephone Encounter (Signed)
 FYI Only or Action Required?: Action required by provider: clinical question for provider.   Patient was last seen in primary care on 12/18/2023 by Karna Fellows, MD.  Called Nurse Triage reporting Knee Pain.  Symptoms began several months ago.  Interventions attempted: OTC medications: Advil.  Symptoms are: gradually worsening.  Triage Disposition: See HCP Within 4 Hours (Or PCP Triage)  Patient/caregiver understands and will follow disposition?: No, wishes to speak with PCP  Reason for Disposition  [1] SEVERE pain (e.g., excruciating, unable to walk) AND [2] not improved after 2 hours of pain medicine  Answer Assessment - Initial Assessment Questions Patient states that she's been having right knee pain for about a year. She states the pain is excruciating 10/10 and she's not able to do any activities, including walking. She has been taking Advil with no relief. Office visit advised, but she states was in the office on 01/25/24 for this pain and she is following up and asking for pain relief medication.   1. LOCATION and RADIATION: Where is the pain located?      Right knee  2. QUALITY: What does the pain feel like?  (e.g., sharp, dull, aching, burning)     Sharp  3. SEVERITY: How bad is the pain? What does it keep you from doing?   (Scale 1-10; or mild, moderate, severe)     10/10  4. ONSET: When did the pain start? Does it come and go, or is it there all the time?     Constant pain-chronic pain   5. RECURRENT: Have you had this pain before? If Yes, ask: When, and what happened then?     Yes, needs knee replacement-scheduled for April  6. SETTING: Has there been any recent work, exercise or other activity that involved that part of the body?      States she has gained some weight  7. AGGRAVATING FACTORS: What makes the knee pain worse? (e.g., walking, climbing stairs, running)     Any activity  8. ASSOCIATED SYMPTOMS: Is there any swelling or redness of  the knee?     Denies swelling and redness  9. OTHER SYMPTOMS: Do you have any other symptoms? (e.g., calf pain, chest pain, difficulty breathing, fever)     Denies any other symptoms  10. PREGNANCY: Is there any chance you are pregnant? When was your last menstrual period?       NA  Protocols used: Knee Pain-A-AH  Copied from CRM #[id number]. Topic: Clinical - Red Word Triage >> Jan 29, 2024  3:53 PM Chiquita SQUIBB wrote: Red Word that prompted transfer to Nurse Triage: Patient is calling in stating that she is having severe pain in her knee , patient states she can not do anything due to the pain and it wakes her up through out the night, and the Advil is not helping.

## 2024-01-29 NOTE — Telephone Encounter (Signed)
 RTC to patient.  Message left that the Clinics had returned her call.  Advised that if pain worsens overnight to go to the ER.

## 2024-01-30 ENCOUNTER — Ambulatory Visit: Payer: Self-pay

## 2024-01-30 NOTE — Telephone Encounter (Signed)
 Patient called back to see if she can be prescribed  pain medication?

## 2024-01-30 NOTE — Telephone Encounter (Signed)
 FYI Only or Action Required?: Action required by provider: patient requesting medication to be sent in .  Patient was last seen in primary care on 12/18/2023 by Karna Fellows, MD.  Called Nurse Triage reporting Knee Pain and Joint Swelling.  Symptoms began chornic knee pain but swelling is new .  Interventions attempted: OTC medications: advil .  Symptoms are: gradually worsening.  Triage Disposition: See HCP Within 4 Hours (Or PCP Triage)  Patient/caregiver understands and will follow disposition?: No, wishes to speak with PCP            Reason for Disposition  [1] Thigh or calf pain AND [2] only 1 side AND [3] present > 1 hour  Answer Assessment - Initial Assessment Questions (This RN did review nurse note 1/6)   Patient reports, Spoke with nurse triage yesterday about knee pain, was asked if knee swelling, didn't know but last night noticed the right knee is very swollen, the whole knee down to foot . No redness. Awake all night.  No history blood clot. Doing house task yesterday gets worse the more does things. Pain right knee 10/10 last night eatting advil  take 4 every time takes them every 4-6 hours. didn't really help. Not bad today in terms of pain hasn't done much. Not able to get to office. Limping due pain .hasn't heard back from Dr Carilyn  office  PM&R  yet but will try and reach again . Wants to have medication sent in , not able to come in to office or go to urgent care , this RN did recommend in person evaluation patient declined. Will route back to office. Patient did report will go to ER with worsening symptoms chest pain, shortness of breath     1. LOCATION and RADIATION: Where is the pain located?      Right knee  2. QUALITY: What does the pain feel like?  (e.g., sharp, dull, aching, burning)     3. SEVERITY: How bad is the pain? What does it keep you from doing?   (Scale 1-10; or mild, moderate, severe)     Severe last night not as bad today  just got up   5. RECURRENT: Have you had this pain before? If Yes, ask: When, and what happened then?     Waiting for knee replacement in April but this swelling is new  AVATING FACTORS: What makes the knee pain worse? (e.g., walking, climbing stairs, running)      Walking and moving around 8. ASSOCIATED SYMPTOMS: Is there any swelling or redness of the knee?     Swelling without redness, swelling is knee down to foot right side  9. OTHER SYMPTOMS: Do you have any other symptoms? (e.g., calf pain, chest pain, difficulty breathing, fever)     Denies the following  chest pain or shortness of breath  Protocols used: Knee Pain-A-AH  Copied from CRM #[id number]. Topic: Clinical - Red Word Triage >> Jan 30, 2024 11:14 AM Chiquita SQUIBB wrote: Red Word that prompted transfer to Nurse Triage: Patient spoke to NT 1/6 regarding extreme knee pain and denied swelling. Patient is calling in today stating that her knee down to her foot are now very swollen and she is still having severe pain.

## 2024-01-31 MED ORDER — BUPRENORPHINE 5 MCG/HR TD PTWK: 1.0000 | MEDICATED_PATCH | TRANSDERMAL | 2 refills | Status: AC

## 2024-01-31 NOTE — Addendum Note (Signed)
 Addended by: CARILYN JODIE BRAVO on: 01/31/2024 09:31 AM   Modules accepted: Orders

## 2024-02-07 ENCOUNTER — Encounter: Payer: Self-pay | Admitting: *Deleted

## 2024-02-07 NOTE — Telephone Encounter (Signed)
 A user error has taken place: encounter opened in error, closed for administrative reasons.

## 2024-02-13 ENCOUNTER — Ambulatory Visit: Admitting: Orthopaedic Surgery

## 2024-02-21 ENCOUNTER — Ambulatory Visit: Admitting: Orthopaedic Surgery

## 2024-02-22 ENCOUNTER — Telehealth: Payer: Self-pay

## 2024-02-22 NOTE — Telephone Encounter (Signed)
 Approval rec'd for Butrans  Patch Copy of approval letter to be scanned into chart PA Case ID #: [id number] Need Help? Call us  at [phone] Outcome Approved today by [facility] Medicare 2017 NCPDP Request Reference Number: [id number]. BUPRENORPHIN DIS 5MCG/HR is approved through 03/23/2024. Your patient may now fill this prescription and it will be covered. Effective Date: 02/22/2024 Authorization Expiration Date: 03/23/2024 Drug Buprenorphine  5MCG/HR weekly patches ePA cloud logo Form [employer] Medicare Part D Electronic Prior Authorization Form [phone] NCPDP)

## 2024-02-26 ENCOUNTER — Ambulatory Visit: Payer: Self-pay

## 2024-02-26 NOTE — Telephone Encounter (Signed)
 FYI Only or Action Required?: Action required by provider: update on patient condition and please specify application directions of Butrans  patch for patient, patient uncertain where to apply, has not yet picked up from pharmacy, plans to do so.  Patient was last seen in primary care on 12/18/2023 by Karna Fellows, MD.  Called Nurse Triage reporting Knee Pain (Upcoming surgery in April. ).  Symptoms began ongoing, chronic.  Interventions attempted: Prescription medications: taking Xanax  and Rest, hydration, or home remedies.  Symptoms are: stable.  Triage Disposition: See PCP Within 2 Weeks - appt on 2/12  Patient/caregiver understands and will follow disposition?: Yes   Copied from CRM #[id number]. Topic: Clinical - Medical Advice >> Feb 26, 2024  5:21 PM DeAngela L wrote: Reason for CRM: the patient is calling to ask if she can be prescribed a medication to help her sleep at night cause she is having knee pains at night and not getting any rest the patient is pacing at night and she just needs a way to lay down and and get some rest   Patient num  [phone]   [facility] [address] [place], [place] - [address] [place] [address] Phone: [phone] Fax: [phone] Hours: Not open 24 hours Reason for Disposition  Leg pain or muscle cramp is a chronic symptom (recurrent or ongoing AND present > 4 weeks)    Has not yet tried new pain medication, going to try.  Additional Information  Negative: [1] SEVERE pain (e.g., excruciating, unable to do any normal activities) AND [2] not improved after 2 hours of pain medicine    Has not yet taken medication.  Answer Assessment - Initial Assessment Questions Continues to have ongoing, chronic knee pain that also goes up into leg and buttock at times.  Sometimes when walking will have a sudden sharp pain.  Pain has affected ability to sleep recently but also reports going through a difficult time  emotionally due to aging, pain, and recently lost pet dog in April.  Can't get another dog until after her upcoming knee surgery and recovery for safety reasons.   Sometimes paces at night and can't sleep, is thus taking more Xanax  at times and now afraid will run out. Reports usually able to fall asleep and keeps room dark/ quiet, leg just throbs. Also has some chronic ongoing back pain at times. Takes time to get up from seated position.  Has upcoming appt on 2/12.   Having difficulty keeping up with household chores.    Does not have a phone number for her daughter in [place] , does not stay in touch.   Does have an emergency contact she can call.   Prefers to spend time with animals; resides in [address] apartment complex that provides activities sometimes. Reports tired and frustrated, not sure what else to take.    Upon further clarification, she states she did not pick up Butrans  patch.  She did not recall this patch and uncertain where to place it.    States doesn't want to take a bunch of medicine but will go ahead and pick up that medication.  Triage RN reviews risks of constipation and possibly dizziness with medication, advising caution when taking and to report any side effects.  Patient lives alone. Sounds to be emotional and possibly tearful during call when talking about aging but also has sense of humor at times.  Snowy weather has also impacted patient.  Patient denies there being any new  symptoms or changes aside from ongoing pain and some frustrations.  Protocols used: Leg Pain-A-AH

## 2024-02-27 ENCOUNTER — Other Ambulatory Visit: Payer: Self-pay

## 2024-02-27 ENCOUNTER — Telehealth: Payer: Self-pay | Admitting: *Deleted

## 2024-02-27 DIAGNOSIS — M4805 Spinal stenosis, thoracolumbar region: Secondary | ICD-10-CM

## 2024-02-27 DIAGNOSIS — G8929 Other chronic pain: Secondary | ICD-10-CM

## 2024-02-27 MED ORDER — PREGABALIN 25 MG PO CAPS: 25.0000 mg | ORAL_CAPSULE | Freq: Two times a day (BID) | ORAL | 5 refills | Status: AC

## 2024-02-27 NOTE — Telephone Encounter (Signed)
 Will forward to Attending Pool.           Copied from CRM [id number]. Topic: Clinical - Medical Advice >> Feb 27, 2024 10:45 AM Miquel SAILOR wrote: Reason for CRM: ALPRAZolam  (XANAX ) 0.5 MG tablet-Miranda from [facility] [phone]: Calling on update if received a fax from here office on PT slowly coming off the medication if ok yes or no to be sent back. Needs call back on update

## 2024-02-27 NOTE — Telephone Encounter (Signed)
 Called pt - pt informed of Dr Evetta response. Informed pt Butrans  patch should be at the pharmacy. Informed pt it can be applied to upper arm, back,or chest. Pt stated she does not want to schedule an appt at this time; she will call back.

## 2024-03-06 ENCOUNTER — Ambulatory Visit: Admitting: Orthopaedic Surgery

## 2024-03-26 ENCOUNTER — Ambulatory Visit (HOSPITAL_BASED_OUTPATIENT_CLINIC_OR_DEPARTMENT_OTHER): Admitting: Cardiology

## 2024-04-28 ENCOUNTER — Encounter: Admitting: Registered Nurse

## 2024-04-29 ENCOUNTER — Ambulatory Visit (HOSPITAL_COMMUNITY): Admit: 2024-04-29 | Admitting: Orthopedic Surgery

## 2024-04-29 SURGERY — ARTHROPLASTY, KNEE, TOTAL
Anesthesia: Spinal | Site: Knee | Laterality: Right

## 2024-09-10 ENCOUNTER — Ambulatory Visit
